# Patient Record
Sex: Female | Born: 1950 | Race: Black or African American | Hispanic: No | Marital: Single | Smoking: Never smoker
Health system: Southern US, Community
[De-identification: ages and names within clinical notes are randomized; demographics above are authoritative.]

## PROBLEM LIST (undated history)

## (undated) DIAGNOSIS — M1990 Unspecified osteoarthritis, unspecified site: Secondary | ICD-10-CM

## (undated) DIAGNOSIS — E785 Hyperlipidemia, unspecified: Secondary | ICD-10-CM

## (undated) DIAGNOSIS — F419 Anxiety disorder, unspecified: Secondary | ICD-10-CM

## (undated) DIAGNOSIS — T7840XA Allergy, unspecified, initial encounter: Secondary | ICD-10-CM

## (undated) DIAGNOSIS — R112 Nausea with vomiting, unspecified: Secondary | ICD-10-CM

## (undated) DIAGNOSIS — H409 Unspecified glaucoma: Secondary | ICD-10-CM

## (undated) DIAGNOSIS — Z9889 Other specified postprocedural states: Secondary | ICD-10-CM

## (undated) DIAGNOSIS — I10 Essential (primary) hypertension: Secondary | ICD-10-CM

## (undated) DIAGNOSIS — K219 Gastro-esophageal reflux disease without esophagitis: Secondary | ICD-10-CM

## (undated) HISTORY — DX: Unspecified osteoarthritis, unspecified site: M19.90

## (undated) HISTORY — DX: Allergy, unspecified, initial encounter: T78.40XA

## (undated) HISTORY — PX: OTHER SURGICAL HISTORY: SHX169

## (undated) HISTORY — DX: Anxiety disorder, unspecified: F41.9

## (undated) HISTORY — DX: Gastro-esophageal reflux disease without esophagitis: K21.9

## (undated) HISTORY — DX: Hyperlipidemia, unspecified: E78.5

## (undated) HISTORY — PX: BREAST CYST EXCISION: SHX579

## (undated) HISTORY — PX: TUBAL LIGATION: SHX77

## (undated) HISTORY — PX: PARTIAL HYSTERECTOMY: SHX80

---

## 2002-02-03 ENCOUNTER — Encounter (HOSPITAL_COMMUNITY): Admission: RE | Admit: 2002-02-03 | Discharge: 2002-03-05 | Payer: Self-pay | Admitting: Preventative Medicine

## 2002-02-15 ENCOUNTER — Encounter: Payer: Self-pay | Admitting: Preventative Medicine

## 2002-02-15 ENCOUNTER — Ambulatory Visit (HOSPITAL_COMMUNITY): Admission: RE | Admit: 2002-02-15 | Discharge: 2002-02-15 | Payer: Self-pay | Admitting: Preventative Medicine

## 2005-02-21 ENCOUNTER — Ambulatory Visit: Payer: Self-pay | Admitting: Family Medicine

## 2005-02-28 ENCOUNTER — Ambulatory Visit (HOSPITAL_COMMUNITY): Admission: RE | Admit: 2005-02-28 | Discharge: 2005-02-28 | Payer: Self-pay | Admitting: Family Medicine

## 2006-08-15 ENCOUNTER — Ambulatory Visit: Payer: Self-pay | Admitting: Internal Medicine

## 2006-08-22 DIAGNOSIS — F4323 Adjustment disorder with mixed anxiety and depressed mood: Secondary | ICD-10-CM

## 2006-08-22 DIAGNOSIS — F411 Generalized anxiety disorder: Secondary | ICD-10-CM

## 2006-08-22 DIAGNOSIS — G5600 Carpal tunnel syndrome, unspecified upper limb: Secondary | ICD-10-CM

## 2006-08-22 DIAGNOSIS — M1990 Unspecified osteoarthritis, unspecified site: Secondary | ICD-10-CM | POA: Insufficient documentation

## 2006-08-28 ENCOUNTER — Emergency Department (HOSPITAL_COMMUNITY): Admission: EM | Admit: 2006-08-28 | Discharge: 2006-08-28 | Payer: Self-pay | Admitting: Emergency Medicine

## 2006-09-05 ENCOUNTER — Ambulatory Visit: Payer: Self-pay | Admitting: Internal Medicine

## 2006-11-21 ENCOUNTER — Ambulatory Visit: Payer: Self-pay | Admitting: Internal Medicine

## 2006-11-27 ENCOUNTER — Ambulatory Visit: Payer: Self-pay | Admitting: Orthopedic Surgery

## 2007-01-09 ENCOUNTER — Ambulatory Visit: Payer: Self-pay | Admitting: Internal Medicine

## 2007-01-09 DIAGNOSIS — I951 Orthostatic hypotension: Secondary | ICD-10-CM | POA: Insufficient documentation

## 2007-02-11 ENCOUNTER — Telehealth (INDEPENDENT_AMBULATORY_CARE_PROVIDER_SITE_OTHER): Payer: Self-pay | Admitting: Internal Medicine

## 2007-04-10 ENCOUNTER — Encounter: Payer: Self-pay | Admitting: Internal Medicine

## 2007-04-10 DIAGNOSIS — N318 Other neuromuscular dysfunction of bladder: Secondary | ICD-10-CM

## 2007-10-29 ENCOUNTER — Encounter: Payer: Self-pay | Admitting: Family Medicine

## 2008-01-08 ENCOUNTER — Ambulatory Visit: Payer: Self-pay | Admitting: Internal Medicine

## 2008-01-08 DIAGNOSIS — J309 Allergic rhinitis, unspecified: Secondary | ICD-10-CM | POA: Insufficient documentation

## 2008-02-15 ENCOUNTER — Encounter: Payer: Self-pay | Admitting: Orthopedic Surgery

## 2008-03-04 ENCOUNTER — Encounter (INDEPENDENT_AMBULATORY_CARE_PROVIDER_SITE_OTHER): Payer: Self-pay | Admitting: Internal Medicine

## 2008-03-11 ENCOUNTER — Ambulatory Visit: Payer: Self-pay | Admitting: Internal Medicine

## 2008-03-25 ENCOUNTER — Encounter (INDEPENDENT_AMBULATORY_CARE_PROVIDER_SITE_OTHER): Payer: Self-pay | Admitting: Internal Medicine

## 2008-03-28 ENCOUNTER — Encounter (INDEPENDENT_AMBULATORY_CARE_PROVIDER_SITE_OTHER): Payer: Self-pay | Admitting: Internal Medicine

## 2008-04-01 ENCOUNTER — Telehealth (INDEPENDENT_AMBULATORY_CARE_PROVIDER_SITE_OTHER): Payer: Self-pay | Admitting: *Deleted

## 2008-05-17 ENCOUNTER — Ambulatory Visit: Payer: Self-pay | Admitting: Family Medicine

## 2008-05-21 ENCOUNTER — Encounter: Payer: Self-pay | Admitting: Family Medicine

## 2008-05-21 LAB — CONVERTED CEMR LAB
BUN: 10 mg/dL (ref 6–23)
Basophils Absolute: 0 10*3/uL (ref 0.0–0.1)
Basophils Relative: 0 % (ref 0–1)
Chloride: 105 meq/L (ref 96–112)
Eosinophils Absolute: 0.4 10*3/uL (ref 0.0–0.7)
Eosinophils Relative: 6 % — ABNORMAL HIGH (ref 0–5)
HDL: 62 mg/dL (ref >39)
Helicobacter Pylori Antibody-IgG: >8 — ABNORMAL HIGH
LDL Cholesterol: 185 mg/dL — ABNORMAL HIGH (ref 0–99)
MCV: 91 fL (ref 78.0–100.0)
Monocytes Absolute: 0.6 10*3/uL (ref 0.1–1.0)
Monocytes Relative: 8 % (ref 3–12)
Neutrophils Relative %: 44 % (ref 43–77)
Potassium: 4.1 meq/L (ref 3.5–5.3)
Sodium: 138 meq/L (ref 135–145)
TSH: 1.693 microintl units/mL (ref 0.350–4.50)
Total CHOL/HDL Ratio: 4.3
Triglycerides: 112 mg/dL (ref <150)
VLDL: 22 mg/dL (ref 0–40)
WBC: 7.6 10*3/uL (ref 4.0–10.5)

## 2008-05-24 ENCOUNTER — Encounter: Payer: Self-pay | Admitting: Family Medicine

## 2008-05-24 DIAGNOSIS — K219 Gastro-esophageal reflux disease without esophagitis: Secondary | ICD-10-CM

## 2008-05-24 LAB — CONVERTED CEMR LAB
Hep A IgM: NEGATIVE
Hepatitis B Surface Ag: NEGATIVE

## 2008-05-25 ENCOUNTER — Ambulatory Visit (HOSPITAL_COMMUNITY): Admission: RE | Admit: 2008-05-25 | Discharge: 2008-05-25 | Payer: Self-pay | Admitting: Family Medicine

## 2008-05-27 ENCOUNTER — Encounter: Payer: Self-pay | Admitting: Family Medicine

## 2008-05-27 LAB — CONVERTED CEMR LAB: ALT: 19 units/L (ref 0–35)

## 2008-06-06 ENCOUNTER — Telehealth: Payer: Self-pay | Admitting: Family Medicine

## 2008-06-07 ENCOUNTER — Telehealth: Payer: Self-pay | Admitting: Family Medicine

## 2008-06-22 ENCOUNTER — Other Ambulatory Visit: Admission: RE | Admit: 2008-06-22 | Discharge: 2008-06-22 | Payer: Self-pay | Admitting: Family Medicine

## 2008-06-22 ENCOUNTER — Ambulatory Visit: Payer: Self-pay | Admitting: Family Medicine

## 2008-06-22 ENCOUNTER — Encounter: Payer: Self-pay | Admitting: Family Medicine

## 2008-06-22 DIAGNOSIS — E785 Hyperlipidemia, unspecified: Secondary | ICD-10-CM

## 2008-08-02 ENCOUNTER — Ambulatory Visit (HOSPITAL_COMMUNITY): Admission: RE | Admit: 2008-08-02 | Discharge: 2008-08-02 | Payer: Self-pay | Admitting: General Surgery

## 2008-08-02 ENCOUNTER — Encounter: Payer: Self-pay | Admitting: Family Medicine

## 2008-08-09 ENCOUNTER — Encounter: Payer: Self-pay | Admitting: Family Medicine

## 2008-09-27 ENCOUNTER — Telehealth: Payer: Self-pay | Admitting: Family Medicine

## 2009-03-13 ENCOUNTER — Ambulatory Visit: Payer: Self-pay | Admitting: Family Medicine

## 2009-03-13 DIAGNOSIS — J069 Acute upper respiratory infection, unspecified: Secondary | ICD-10-CM | POA: Insufficient documentation

## 2009-03-13 DIAGNOSIS — J019 Acute sinusitis, unspecified: Secondary | ICD-10-CM | POA: Insufficient documentation

## 2009-03-18 ENCOUNTER — Encounter: Payer: Self-pay | Admitting: Family Medicine

## 2009-03-20 ENCOUNTER — Telehealth: Payer: Self-pay | Admitting: Family Medicine

## 2009-03-22 LAB — CONVERTED CEMR LAB
AST: 32 units/L (ref 0–37)
Albumin: 4.2 g/dL (ref 3.5–5.2)
Bilirubin, Direct: 0.1 mg/dL (ref 0.0–0.3)
Cholesterol: 177 mg/dL (ref 0–200)
Indirect Bilirubin: 0.3 mg/dL (ref 0.0–0.9)
LDL Cholesterol: 89 mg/dL (ref 0–99)
Total Bilirubin: 0.4 mg/dL (ref 0.3–1.2)
Triglycerides: 126 mg/dL (ref <150)
VLDL: 25 mg/dL (ref 0–40)

## 2009-04-03 ENCOUNTER — Telehealth: Payer: Self-pay | Admitting: Family Medicine

## 2009-05-22 ENCOUNTER — Telehealth: Payer: Self-pay | Admitting: Family Medicine

## 2009-08-01 ENCOUNTER — Telehealth: Payer: Self-pay | Admitting: Family Medicine

## 2009-10-30 ENCOUNTER — Ambulatory Visit: Payer: Self-pay | Admitting: Family Medicine

## 2009-10-30 DIAGNOSIS — R5381 Other malaise: Secondary | ICD-10-CM | POA: Insufficient documentation

## 2009-10-30 DIAGNOSIS — R5383 Other fatigue: Secondary | ICD-10-CM

## 2009-11-02 ENCOUNTER — Ambulatory Visit (HOSPITAL_COMMUNITY): Admission: RE | Admit: 2009-11-02 | Discharge: 2009-11-02 | Payer: Self-pay | Admitting: Family Medicine

## 2009-11-06 LAB — CONVERTED CEMR LAB
AST: 26 units/L (ref 0–37)
Alkaline Phosphatase: 42 units/L (ref 39–117)
BUN: 9 mg/dL (ref 6–23)
Basophils Relative: 1 % (ref 0–1)
CO2: 27 meq/L (ref 19–32)
Calcium: 9.6 mg/dL (ref 8.4–10.5)
Chloride: 103 meq/L (ref 96–112)
HDL: 59 mg/dL (ref >39)
MCHC: 33 g/dL (ref 30.0–36.0)
MCV: 91.4 fL (ref 78.0–100.0)
Monocytes Absolute: 0.4 10*3/uL (ref 0.1–1.0)
Platelets: 311 10*3/uL (ref 150–400)
Potassium: 4.3 meq/L (ref 3.5–5.3)
RBC: 3.84 M/uL — ABNORMAL LOW (ref 3.87–5.11)
Sodium: 142 meq/L (ref 135–145)
TSH: 0.883 microintl units/mL (ref 0.350–4.500)
Total CHOL/HDL Ratio: 3.4
Triglycerides: 122 mg/dL (ref <150)

## 2010-01-01 ENCOUNTER — Encounter: Payer: Self-pay | Admitting: Family Medicine

## 2010-04-13 ENCOUNTER — Telehealth (INDEPENDENT_AMBULATORY_CARE_PROVIDER_SITE_OTHER): Payer: Self-pay | Admitting: *Deleted

## 2010-07-09 ENCOUNTER — Telehealth: Payer: Self-pay | Admitting: Family Medicine

## 2010-09-26 ENCOUNTER — Ambulatory Visit: Payer: Self-pay | Admitting: Family Medicine

## 2010-09-26 DIAGNOSIS — J209 Acute bronchitis, unspecified: Secondary | ICD-10-CM | POA: Insufficient documentation

## 2010-11-17 ENCOUNTER — Encounter: Payer: Self-pay | Admitting: Internal Medicine

## 2010-11-27 NOTE — Assessment & Plan Note (Signed)
Summary: office visit   Vital Signs:  Patient profile:   60 year old female Menstrual status:  hysterectomy Height:      65.5 inches Weight:      152.75 pounds BMI:     25.12 O2 Sat:      99 % on Room air Pulse rate:   80 / minute Pulse rhythm:   regular Resp:     16 per minute BP sitting:   140 / 90  (left arm)  Vitals Entered By: Adella Hare LPN (September 26, 2010 11:38 AM)  Nutrition Counseling: Patient's BMI is greater than 25 and therefore counseled on weight management options.  O2 Flow:  Room air CC: follow-up visit Is Patient Diabetic? No Pain Assessment Patient in pain? no        Primary Care Zarianna Dicarlo:  Syliva Overman MD  CC:  follow-up visit.  History of Present Illness: 5 day h/o cough productive of green sputum occasional choills. Left TM j pain x 2 weeks increased post nsal drainage, her symptoms persist.  Reports  that they are doing well.  Denies chest pain, palpitations, PND, orthopnea or leg swelling. Denies abdominal pain, nausea, vomitting, diarrhea or constipation. Denies change in bowel movements or bloody stool. Denies dysuria , frequency, incontinence or hesitancy. Denies  joint pain, swelling, or reduced mobility. Denies headaches, vertigo, seizures. Denies uncontrolled  depression, anxiety or insomnia.currently on meds. Denies  rash, lesions, or itch.     Current Medications (verified): 1)  Daily Multiple Vitamins  Tabs (Multiple Vitamin) 2)  Calcium 600 Mg Tabs (Calcium) 3)  Citalopram Hydrobromide 20 Mg  Tabs (Citalopram Hydrobromide) .Marland Kitchen.. 1 By Mouth Once Daily 4)  Loratadine 10 Mg  Tabs (Loratadine) .Marland Kitchen.. 1 By Mouth Once Daily 5)  Crestor 20 Mg Tabs (Rosuvastatin Calcium) .... One Tab By Mouth Qhs 6)  Flonase 50 Mcg/act Susp (Fluticasone Propionate) .Marland Kitchen.. 1 Puff Per Nostril Daily 7)  Omeprazole 20 Mg Cpdr (Omeprazole) .... Two Cap By Mouth Once Daily  Allergies (verified): 1)  ! Motrin 2)  ! * Strong Pain Meds  Review of  Systems      See HPI General:  Complains of chills and fatigue. Eyes:  Denies discharge, eye pain, and red eye. ENT:  Complains of nasal congestion, postnasal drainage, and sinus pressure. CV:  Denies chest pain or discomfort, palpitations, and swelling of feet. Resp:  Complains of cough and sputum productive. GI:  Denies abdominal pain, constipation, diarrhea, nausea, and vomiting. GU:  Denies dysuria and urinary frequency. Endo:  Denies cold intolerance, excessive hunger, excessive thirst, and polyuria. Heme:  Denies abnormal bruising and bleeding. Allergy:  Denies hives or rash and itching eyes.  Physical Exam  General:  Well-developed,well-nourished,in no acute distress; alert,appropriate and cooperative throughout examinationPt is hoarse HEENT: No facial asymmetry,  EOMI, maxillary inus tenderness, TM's Clear, left TMJ tender, oropharynx  pink and moist.   Chest: decreased airentry, scattered crackles, no wheezes CVS: S1, S2, No murmurs, No S3.   Abd: Soft, Nontender.  MS: Adequate ROM spine, hips, shoulders and knees.  Ext: No edema.   CNS: CN 2-12 intact, power tone and sensation normal throughout.   Skin: Intact, no visible lesions or rashes.  Psych: Good eye contact, normal affect.  Memory intact, not anxious or depressed appearing.    Impression & Recommendations:  Problem # 1:  ACUTE BRONCHITIS (ICD-466.0) Assessment Comment Only  The following medications were removed from the medication list:    Penicillin V Potassium 500 Mg  Tabs (Penicillin v potassium) .Marland Kitchen... Take 1 tablet by mouth three times a day Her updated medication list for this problem includes:    Penicillin V Potassium 500 Mg Tabs (Penicillin v potassium) .Marland Kitchen... Take 1 tablet by mouth three times a day    Tessalon Perles 100 Mg Caps (Benzonatate) .Marland Kitchen... Take 1 capsule by mouth three times a day  Orders: Rocephin  250mg  (Z6109) Depo- Medrol 80mg  (J1040) Admin of Therapeutic Inj  intramuscular or  subcutaneous (60454)  Problem # 2:  ACUTE SINUSITIS, UNSPECIFIED (ICD-461.9) Assessment: Comment Only  The following medications were removed from the medication list:    Penicillin V Potassium 500 Mg Tabs (Penicillin v potassium) .Marland Kitchen... Take 1 tablet by mouth three times a day Her updated medication list for this problem includes:    Flonase 50 Mcg/act Susp (Fluticasone propionate) .Marland Kitchen... 1 puff per nostril daily    Penicillin V Potassium 500 Mg Tabs (Penicillin v potassium) .Marland Kitchen... Take 1 tablet by mouth three times a day    Tessalon Perles 100 Mg Caps (Benzonatate) .Marland Kitchen... Take 1 capsule by mouth three times a day  Problem # 3:  DEPRESSION (ICD-311) Assessment: Improved  Her updated medication list for this problem includes:    Citalopram Hydrobromide 20 Mg Tabs (Citalopram hydrobromide) .Marland Kitchen... 1 by mouth once daily  Problem # 4:  HYPERLIPIDEMIA (ICD-272.4) Assessment: Comment Only  Her updated medication list for this problem includes:    Crestor 20 Mg Tabs (Rosuvastatin calcium) ..... One tab by mouth qhs  Labs Reviewed: SGOT: 26 (10/31/2009)   SGPT: 22 (10/31/2009)   HDL:59 (10/31/2009), 63 (03/18/2009)  LDL:115 (10/31/2009), 89 (09/81/1914)  Chol:198 (10/31/2009), 177 (03/18/2009)  Trig:122 (10/31/2009), 126 (03/18/2009)  Complete Medication List: 1)  Daily Multiple Vitamins Tabs (Multiple vitamin) 2)  Calcium 600 Mg Tabs (Calcium) 3)  Citalopram Hydrobromide 20 Mg Tabs (Citalopram hydrobromide) .Marland Kitchen.. 1 by mouth once daily 4)  Loratadine 10 Mg Tabs (Loratadine) .Marland Kitchen.. 1 by mouth once daily 5)  Crestor 20 Mg Tabs (Rosuvastatin calcium) .... One tab by mouth qhs 6)  Flonase 50 Mcg/act Susp (Fluticasone propionate) .Marland Kitchen.. 1 puff per nostril daily 7)  Omeprazole 20 Mg Cpdr (Omeprazole) .... Two cap by mouth once daily 8)  Penicillin V Potassium 500 Mg Tabs (Penicillin v potassium) .... Take 1 tablet by mouth three times a day 9)  Tessalon Perles 100 Mg Caps (Benzonatate) .... Take 1  capsule by mouth three times a day  Patient Instructions: 1)  you are being treated for sinusits , laryngitis and  bronchitis,. 2)  Meds will be sent to your pharmacy, also  you will get injections in the office. Prescriptions: TESSALON PERLES 100 MG CAPS (BENZONATATE) Take 1 capsule by mouth three times a day  #21 x 0   Entered and Authorized by:   Syliva Overman MD   Signed by:   Syliva Overman MD on 09/26/2010   Method used:   Electronically to        [facility] [phone]* (retail)       [address]       [place], [place]  [address]       Ph: [phone] or [phone]       Fax: [phone]   RxID:   [id number] PENICILLIN V POTASSIUM 500 MG TABS (PENICILLIN V POTASSIUM) Take 1 tablet by mouth three times a day  #21 x 0   Entered and Authorized by:   Syliva Overman MD  Signed by:   Syliva Overman MD on 09/26/2010   Method used:   Electronically to        [facility] [phone]* (retail)       [address]       [place], [place]  [address]       Ph: [phone] or [phone]       Fax: [phone]   RxID:   [id number]    Medication Administration  Injection # 1:    Medication: Rocephin  250mg     Diagnosis: ACUTE BRONCHITIS (ICD-466.0)    Route: IM    Site: LUOQ gluteus    Exp Date: 01/14    Lot #: [id number]    Mfr: [email or web address]    Comments: rocephon 500mg  given    Patient tolerated injection without complications    Given by: Adella Hare LPN (September 26, 2010 2:18 PM)  Injection # 2:    Medication: Depo- Medrol 80mg     Diagnosis: ACUTE BRONCHITIS (ICD-466.0)    Route: IM    Site: RUOQ gluteus    Exp Date: 06/12    Lot #: [id number]    Mfr: Pharmacia    Patient tolerated injection without complications    Given by: Adella Hare LPN (September 26, 2010 2:18 PM)  Orders Added: 1)  Est. Patient Level IV [34742] 2)  Rocephin  250mg  [J0696] 3)  Depo- Medrol 80mg  [J1040] 4)  Admin of Therapeutic Inj  intramuscular or  subcutaneous [96372]     Crestor samples given [id number] 02/14  Medication Administration  Injection # 1:    Medication: Rocephin  250mg     Diagnosis: ACUTE BRONCHITIS (ICD-466.0)    Route: IM    Site: LUOQ gluteus    Exp Date: 01/14    Lot #: [id number]    Mfr: [email or web address]    Comments: rocephon 500mg  given    Patient tolerated injection without complications    Given by: Adella Hare LPN (September 26, 2010 2:18 PM)  Injection # 2:    Medication: Depo- Medrol 80mg     Diagnosis: ACUTE BRONCHITIS (ICD-466.0)    Route: IM    Site: RUOQ gluteus    Exp Date: 06/12    Lot #: [id number]    Mfr: Pharmacia    Patient tolerated injection without complications    Given by: Adella Hare LPN (September 26, 2010 2:18 PM)  Orders Added: 1)  Est. Patient Level IV [29518] 2)  Rocephin  250mg  [J0696] 3)  Depo- Medrol 80mg  [J1040] 4)  Admin of Therapeutic Inj  intramuscular or subcutaneous [84166]

## 2010-11-27 NOTE — Letter (Signed)
Summary: rpc chart  rpc chart   Imported By: Curtis Sites 08/09/2010 15:20:36  _____________________________________________________________________  External Attachment:    Type:   Image     Comment:   External Document

## 2010-11-27 NOTE — Progress Notes (Signed)
Summary: CRESTOR SAMPLES  Phone Note Call from Patient   Summary of Call: PATIENT WOULD LIKE CRESTOR SAMPLES PLEASE ADVISE.   Initial call taken by: Rudene Anda,  July 09, 2010 2:56 PM  Follow-up for Phone Call        patient aware Follow-up by: Adella Hare LPN,  July 09, 2010 4:24 PM

## 2010-11-27 NOTE — Progress Notes (Signed)
Summary: SAMPLES  Phone Note Call from Patient   Summary of Call: NEEDS SAMPLES OF CRESTOR Initial call taken by: Lind Guest,  April 13, 2010 10:44 AM  Follow-up for Phone Call        there a re 5 weeks  of crestor in the cupboard pls give them to her ,  Follow-up by: Syliva Overman MD,  April 13, 2010 12:35 PM  Additional Follow-up for Phone Call Additional follow up Details #1::        Patient aware there are some here for her Additional Follow-up by: Everitt Amber LPN,  April 16, 2010 10:06 AM

## 2010-11-27 NOTE — Assessment & Plan Note (Signed)
Summary: office visit   Vital Signs:  Patient profile:   60 year old female Menstrual status:  hysterectomy Height:      65.5 inches Weight:      155.50 pounds BMI:     25.58 O2 Sat:      95 % Pulse rate:   74 / minute Pulse rhythm:   regular Resp:     16 per minute BP sitting:   138 / 82  (left arm)  Vitals Entered By: Everitt Amber (October 30, 2009 11:23 AM)  Nutrition Counseling: Patient's BMI is greater than 25 and therefore counseled on weight management options. CC: Follow up chronic problems Is Patient Diabetic? No   Primary Care Provider:  Syliva Overman MD  CC:  Follow up chronic problems.  History of Present Illness: Reports  that she has been  doing well. Denies recent fever or chills. Denies sinus pressure, nasal congestion , ear pain or sore throat. Denies chest congestion, or cough productive of sputum. Denies chest pain, palpitations, PND, orthopnea or leg swelling. Denies abdominal pain, nausea, vomitting, diarrhea or constipation. Denies change in bowel movements or bloody stool. Denies dysuria , frequency, incontinence or hesitancy. Denies  joint pain, swelling, or reduced mobility. Denies headaches, vertigo, seizures.  Denies  rash, lesions, or itch. She reports continued depressioin,discusses the fact that her only daughter is gay, she has problems with this espescially since people continually put it in her face. SDhe has used alcohol in the past to self medicate, and continues to struggle with this. She does not exercise regfularly, although she manages to maintain a health weight,.     Preventive Screening-Counseling & Management  Alcohol-Tobacco     Smoking Cessation Counseling: yes  Allergies: 1)  ! Motrin 2)  ! * Strong Pain Meds  Review of Systems      See HPI Eyes:  Denies blurring, discharge, and red eye. Neuro:  Denies headaches, seizures, and sensation of room spinning. Psych:  Complains of depression and easily tearful; denies  sense of great danger, suicidal thoughts/plans, thoughts of violence, and unusual visions or sounds. Endo:  Denies cold intolerance, excessive hunger, excessive thirst, excessive urination, heat intolerance, polyuria, and weight change. Heme:  Denies abnormal bruising and bleeding. Allergy:  Complains of seasonal allergies and sneezing; denies hives or rash.  Physical Exam  General:  Well-developed,well-nourished,in no acute distress; alert,appropriate and cooperative throughout examination HEENT: No facial asymmetry,  EOMI, No sinus tenderness, TM's Clear, oropharynx  pink and moist.   Chest: Clear to auscultation bilaterally.  CVS: S1, S2, No murmurs, No S3.   Abd: Soft, Nontender.  MS: Adequate ROM spine, hips, shoulders and knees.  Ext: No edema.   CNS: CN 2-12 intact, power tone and sensation normal throughout.   Skin: Intact, no visible lesions or rashes.  Psych: Good eye contact, normal affect.  Memory intact, not anxious or depressed appearing.    Impression & Recommendations:  Problem # 1:  HYPERLIPIDEMIA (ICD-272.4) Assessment Comment Only  Her updated medication list for this problem includes:    Crestor 20 Mg Tabs (Rosuvastatin calcium) ..... 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 1 by mouth once daily    Flonase 50 Mcg/act Susp (Fluticasone propionate) .Marland Kitchen... 1 puff per nostril daily  Problem # 5:  DEPRESSION (ICD-311) Assessment: Improved  Her updated medication list for this problem includes:    Citalopram Hydrobromide 20 Mg Tabs (Citalopram hydrobromide) .Marland Kitchen... 1 by mouth once daily pt ventilated for approx 15 mins during the ov, she does not feel the need to seek pout regular therapy  Complete Medication List: 1)  Daily Multiple Vitamins Tabs (Multiple vitamin) 2)  Calcium 600 Mg Tabs (Calcium) 3)  Citalopram Hydrobromide 20 Mg Tabs (Citalopram hydrobromide) .Marland Kitchen.. 1 by mouth once daily 4)  Loratadine 10 Mg Tabs (Loratadine) .Marland Kitchen.. 1 by mouth once daily 5)  Crestor 20 Mg Tabs (Rosuvastatin calcium) .... One tab by mouth qhs 6)  Penicillin V Potassium 500 Mg Tabs (Penicillin v potassium) .... Take 1 tablet by mouth three times a day 7)  Flonase 50 Mcg/act Susp (Fluticasone propionate) .Marland Kitchen.. 1 puff per nostril daily 8)  Omeprazole 20 Mg Cpdr (Omeprazole) .... Two cap by mouth once daily  Other Orders: T-Basic Metabolic Panel [id number]) T-CBC w/Diff [id number]) T-TSH [id number]) Radiology Referral (Radiology)  Patient Instructions: 1)  cPE in 5.26months. 2)  Tobacco is very bad for your health and your loved ones! You Should stop smoking!. 3)  Stop Smoking Tips: Choose a Quit date. Cut down before the Quit date. decide what you will do as a substitute when you feel the urge to smoke(gum,toothpick,exercise). 4)  It is not healthy  for men to drink more than 2-3 drinks per day or for women to drink more than 1-2 drinks per day. 5)  BMP prior to visit, ICD-9: 6)  Hepatic Panel prior to visit, ICD-9: 7)  Lipid Panel prior to visit, ICD-9: fasting tomorrow 8)  TSH prior to visit, ICD-9: 9)  CBC w/ Diff prior to visit, ICD-9:

## 2010-11-27 NOTE — Miscellaneous (Signed)
Summary: samples  Clinical Lists Changes  Medications: Changed medication from CRESTOR 20 MG TABS (ROSUVASTATIN CALCIUM) ONE TAB by mouth QHS to CRESTOR 20 MG TABS (ROSUVASTATIN CALCIUM) ONE TAB by mouth QHS - Signed Rx of CRESTOR 20 MG TABS (ROSUVASTATIN CALCIUM) ONE TAB by mouth QHS;  #42 x 0;  Signed;  Entered by: Everitt Amber LPN;  Authorized by: Syliva Overman MD;  Method used: Samples Given    Prescriptions: CRESTOR 20 MG TABS (ROSUVASTATIN CALCIUM) ONE TAB by mouth QHS  #42 x 0   Entered by:   Everitt Amber LPN   Authorized by:   Syliva Overman MD   Signed by:   Everitt Amber LPN on 60/45/4098   Method used:   Samples Given   RxID:   [id number]

## 2011-02-04 ENCOUNTER — Telehealth: Payer: Self-pay | Admitting: Family Medicine

## 2011-02-04 NOTE — Telephone Encounter (Signed)
Once urine submitted for c/s only, cipro 500mg  one twice daily for 7 days is to be prescribed

## 2011-02-05 ENCOUNTER — Telehealth: Payer: Self-pay | Admitting: Family Medicine

## 2011-02-05 NOTE — Telephone Encounter (Signed)
Patient to come submit urine

## 2011-02-06 ENCOUNTER — Ambulatory Visit (INDEPENDENT_AMBULATORY_CARE_PROVIDER_SITE_OTHER): Payer: Self-pay | Admitting: Family Medicine

## 2011-02-06 DIAGNOSIS — N3000 Acute cystitis without hematuria: Secondary | ICD-10-CM

## 2011-02-06 DIAGNOSIS — N3090 Cystitis, unspecified without hematuria: Secondary | ICD-10-CM

## 2011-02-06 LAB — POCT URINALYSIS DIPSTICK
Bilirubin, UA: NEGATIVE
Glucose, UA: NEGATIVE
Ketones, UA: NEGATIVE
Nitrite, UA: NEGATIVE
Urobilinogen, UA: 0.2
pH, UA: 7

## 2011-02-06 MED ORDER — CIPROFLOXACIN HCL 500 MG PO TABS: 500.0000 mg | ORAL_TABLET | Freq: Two times a day (BID) | ORAL | Status: AC

## 2011-02-06 NOTE — Progress Notes (Signed)
Urine positive for blood and leukocytes. Sending for culture.

## 2011-02-06 NOTE — Progress Notes (Signed)
pls call pt she needs a cbc and diff , offer rocephin 500mg  Im x 1 since she has been symptomatic for a while, and also  Let her know to fill the script for ciprofloxacin today since she appears to have a uti

## 2011-02-06 NOTE — Progress Notes (Signed)
  Subjective:    Patient ID: [name], female    DOB: [birth year], 60 y.o.   MRN: [id number]  HPI    Review of Systems     Objective:   Physical Exam        Assessment & Plan:

## 2011-02-06 NOTE — Progress Notes (Signed)
  Subjective:    Patient ID: [name], female    DOB: May 25, 1951, 60 y.o.   MRN: [id number]  HPI    Review of Systems     Objective:   Physical Exam        Assessment & Plan:  Pt needs to fill script for ciprofloxacin today, she is to be informed

## 2011-02-11 NOTE — Progress Notes (Signed)
Patient does not want injection or labs. Wants to see if the meds work first

## 2011-02-24 ENCOUNTER — Observation Stay (HOSPITAL_COMMUNITY)
Admission: EM | Admit: 2011-02-24 | Discharge: 2011-02-26 | Disposition: A | Discharge status: Home / Self Care | Payer: Self-pay | Source: Ambulatory Visit | Attending: Internal Medicine | Admitting: Internal Medicine

## 2011-02-24 ENCOUNTER — Emergency Department (HOSPITAL_COMMUNITY): Payer: Self-pay

## 2011-02-24 DIAGNOSIS — IMO0001 Reserved for inherently not codable concepts without codable children: Secondary | ICD-10-CM | POA: Insufficient documentation

## 2011-02-24 DIAGNOSIS — F411 Generalized anxiety disorder: Secondary | ICD-10-CM | POA: Insufficient documentation

## 2011-02-24 DIAGNOSIS — R269 Unspecified abnormalities of gait and mobility: Secondary | ICD-10-CM | POA: Insufficient documentation

## 2011-02-24 DIAGNOSIS — R55 Syncope and collapse: Secondary | ICD-10-CM | POA: Insufficient documentation

## 2011-02-24 DIAGNOSIS — T466X5A Adverse effect of antihyperlipidemic and antiarteriosclerotic drugs, initial encounter: Secondary | ICD-10-CM | POA: Insufficient documentation

## 2011-02-24 DIAGNOSIS — R209 Unspecified disturbances of skin sensation: Secondary | ICD-10-CM | POA: Insufficient documentation

## 2011-02-24 DIAGNOSIS — E785 Hyperlipidemia, unspecified: Secondary | ICD-10-CM | POA: Insufficient documentation

## 2011-02-24 DIAGNOSIS — G459 Transient cerebral ischemic attack, unspecified: Principal | ICD-10-CM | POA: Insufficient documentation

## 2011-02-24 LAB — BASIC METABOLIC PANEL
CO2: 28 mEq/L (ref 19–32)
Calcium: 9.5 mg/dL (ref 8.4–10.5)
Creatinine, Ser: 0.87 mg/dL (ref 0.4–1.2)
GFR calc non Af Amer: >60 mL/min (ref >60)
Glucose, Bld: 92 mg/dL (ref 70–99)
Sodium: 140 mEq/L (ref 135–145)

## 2011-02-24 LAB — DIFFERENTIAL
Lymphocytes Relative: 37 % (ref 12–46)
Lymphs Abs: 1.9 10*3/uL (ref 0.7–4.0)
Neutro Abs: 2.4 10*3/uL (ref 1.7–7.7)

## 2011-02-24 LAB — POCT CARDIAC MARKERS
CKMB, poc: 1.6 ng/mL (ref 1.0–8.0)
Troponin i, poc: <0.05 ng/mL (ref 0.00–0.09)

## 2011-02-24 LAB — CBC
HCT: 34.1 % — ABNORMAL LOW (ref 36.0–46.0)
Hemoglobin: 11.3 g/dL — ABNORMAL LOW (ref 12.0–15.0)
MCH: 30.2 pg (ref 26.0–34.0)
MCHC: 33.1 g/dL (ref 30.0–36.0)
MCV: 91.2 fL (ref 78.0–100.0)

## 2011-02-24 LAB — URINALYSIS, ROUTINE W REFLEX MICROSCOPIC
Glucose, UA: NEGATIVE mg/dL
Specific Gravity, Urine: <1.005 — ABNORMAL LOW (ref 1.005–1.030)
Urobilinogen, UA: 0.2 mg/dL (ref 0.0–1.0)
pH: 7 (ref 5.0–8.0)

## 2011-02-24 LAB — CARDIAC PANEL(CRET KIN+CKTOT+MB+TROPI)
CK, MB: 2.6 ng/mL (ref 0.3–4.0)
Total CK: 191 U/L — ABNORMAL HIGH (ref 7–177)

## 2011-02-24 NOTE — H&P (Signed)
NAMELINETH, THIELKE                   ACCOUNT NO.:  [id number]  MEDICAL RECORD NO.:  [id number]           PATIENT TYPE:  E  LOCATION:  [facility]                          FACILITY:  [facility]  PHYSICIAN:  Talmage Nap, MD  DATE OF BIRTH:  [birth year]  DATE OF ADMISSION:  02/24/2011 DATE OF DISCHARGE:  LH                             HISTORY & PHYSICAL   PRIMARY CARE PHYSICIAN:  Milus Mallick. Lodema Hong, MD, Family Practice.  History obtainable from the patient, the patient's granddaughter and a family member.  CHIEF COMPLAINT:  Numbness on the right side of the face and unsteady gait which occurred at about 10 o'clock a.m at church while the patient was singing.  The patient is a 60 year old very pleasant African American female with history of anxiety disorder and dyslipidemia, presenting to the hospital with sudden onset of numbness on the right side of the face and unsteady gait and this was said to have occurred while the patient was singing in church.  The patient says she had been in stable health until today at about 10 o'clock a.m while she was in the church.  She observed that she was numb on the right side of the face, especially around the mouth.  This was said to be associated with dizziness and feeling of trying to pass out.  She was also said to have had "unsteady gait."No slurred speech. She denied any history of chest pain.  She claims she had subjective feeling of shortness of breath.  She denied any nausea or vomiting.  No fever.  No chills.  No rigor.  She also denies any history of PND or orthopnea. When the patient symptoms persisted, she claimed that she sat down to rest and subsequently was brought to the emergency room.  In the ED, symptoms were said to have abated and after evaluation, she was advised to be admitted for further workup.  PAST MEDICAL HISTORY:  Positive for anxiety disorder and dyslipidemia.  PAST SURGICAL HISTORY:  Cystectomy, right breast and  hysterectomy.  MEDICATIONS:  Her preadmission meds include fish oil dose unknown and vitamin E, D-alpha dose unknown.  ALLERGIES:  To MOTRIN.  SOCIAL HISTORY:  Negative for tobacco use and occasionally takes alcohol.  FAMILY HISTORY:  York Spaniel to be positive for diabetes mellitus and coronary artery disease.  REVIEW OF SYSTEMS:  The patient denies any history of headaches.  No blurry vision.  No nausea or vomiting.  No fever.  No chills.  No rigor. No chest pain or shortness of breath.  Denies any cough.  The numbness on the right side of the face and feeling of trying to pass out had resolved.  She denies any PND or orthopnea.  No abdominal discomfort. No diarrhea or hematochezia.  No dysuria or hematuria.  No swelling of the lower extremities.  No intolerance to heat and cold and no neuropsychiatric disorder.  PHYSICAL EXAMINATION:  GENERAL:  Very pleasant middle-aged lady well- hydrated, not in any obvious respiratory distress at present,anxious VITAL SIGNS:  Blood pressure is 141/121, pulse is 87, respiratory rate is 18 and temperature is 98.7.  HEENT:  Pupils are reactive to light and extraocular muscles are intact. NECK:  No jugular venous distention.  No carotid bruit.  No lymphadenopathy. CHEST:  Clear to auscultation. CARDIAC:  Heart sounds are 1 and 2. ABDOMEN:  Soft and nontender.  Liver, spleen and kidney are not palpable.  Bowel sounds are positive. EXTREMITIES:  No pedal edema. NEUROLOGIC:  Speech to be appropriate.  Muscle power right upper extremity 4/5, right lower extremity 4/5, left upper extremity 4/5, left lower extremity 5/5.  Sensory sensation intact. MUSCULOSKELETAL SYSTEM:  Unremarkable. NEUROPSYCHIATRIC EVALUATION:  Unremarkable. SKIN:  Normal turgor.  LABORATORY DATA:  Urinalysis done on the patient was unremarkable. Hematological indices showed WBC of 5.1, hemoglobin 11.3, hematocrit 34.1, MCV 91.2 with a platelet count of 396.  Chemistry show a  sodium of 140, potassium of 3.7, chloride of 107 with a bicarb of 28, glucose is 92, BUN is 9 and creatinine 0.87.  First set of cardiac markers troponin- I less than 0.05, CK-MB 1.6, normal.  EKG showed normal sinus rhythm with no acute ST-wave changes noted.  Imaging studies done on the patient include CT of the head without contrast which showed no acute intracranial abnormality.  There is  mucosal thickening with air- fluid level.  Right maxillary sinus was suspicious for sinusitis and chest x-ray showed borderline cardiomegaly with mild thoracic spondylosis.  IMPRESSION: 1. Presyncope, questionable transient ischemic attack. 2. Anxiety disorder. 3. Dyslipidemia. 4. Elevated blood pressure  PLAN: 1. To admit the patient to telemetry.  The patient will be saline     lock.  She will be on aspirin 325 mg p.o. daily, Zocor 20 mg p.o.     daily and her blood pressure will be controlled with lisinopril 10     mg p.o. daily.Protonix 40 mg p.o daily for GI prophylaxis and lovenox 40 mg     subcut for DVT prophylaxis.Xanax 0.25 mg p.o bid prn for anxiety. 2. Further labs to be ordered on this patient will include cardiac     enzymes q.6 x3, thyroid panel which include TSH, T3 and T4,     hemoglobin A1c, lipid panel, magnesium level stat, CBC, CMP and     magnesium repeated in a.m.  Imaging studies to be ordered will     include 2-D echo,Carotid duplex and MRI and MRA of the head and neck.      The patient will be evaluated with imaging and lab results and she     will be followed on a daily basis.     Talmage Nap, MD     CN/MEDQ  D:  02/24/2011  T:  02/24/2011  Job:  [id number]  Electronically Signed by Talmage Nap  on 02/24/2011 10:33:57 PM

## 2011-02-25 ENCOUNTER — Inpatient Hospital Stay (HOSPITAL_COMMUNITY): Payer: Self-pay

## 2011-02-25 DIAGNOSIS — I517 Cardiomegaly: Secondary | ICD-10-CM

## 2011-02-25 LAB — DIFFERENTIAL
Basophils Absolute: 0 10*3/uL (ref 0.0–0.1)
Basophils Relative: 0 % (ref 0–1)
Eosinophils Relative: 6 % — ABNORMAL HIGH (ref 0–5)
Lymphs Abs: 2.7 10*3/uL (ref 0.7–4.0)
Monocytes Absolute: 0.4 10*3/uL (ref 0.1–1.0)
Monocytes Relative: 8 % (ref 3–12)

## 2011-02-25 LAB — TSH: TSH: 2.413 u[IU]/mL (ref 0.350–4.500)

## 2011-02-25 LAB — COMPREHENSIVE METABOLIC PANEL
Albumin: 3.3 g/dL — ABNORMAL LOW (ref 3.5–5.2)
Alkaline Phosphatase: 40 U/L (ref 39–117)
BUN: 9 mg/dL (ref 6–23)
Creatinine, Ser: 0.84 mg/dL (ref 0.4–1.2)
GFR calc Af Amer: >60 mL/min (ref >60)
Glucose, Bld: 98 mg/dL (ref 70–99)
Sodium: 141 mEq/L (ref 135–145)
Total Protein: 6.8 g/dL (ref 6.0–8.3)

## 2011-02-25 LAB — T3: T3, Total: 96.5 ng/dl (ref 80.0–204.0)

## 2011-02-25 LAB — CBC
HCT: 34.7 % — ABNORMAL LOW (ref 36.0–46.0)
Hemoglobin: 11.4 g/dL — ABNORMAL LOW (ref 12.0–15.0)
MCHC: 32.9 g/dL (ref 30.0–36.0)
RBC: 3.8 MIL/uL — ABNORMAL LOW (ref 3.87–5.11)
WBC: 5.5 10*3/uL (ref 4.0–10.5)

## 2011-02-25 LAB — CARDIAC PANEL(CRET KIN+CKTOT+MB+TROPI): Total CK: 162 U/L (ref 7–177)

## 2011-02-25 LAB — HEMOGLOBIN A1C
Hgb A1c MFr Bld: 5.9 % — ABNORMAL HIGH (ref <5.7)
Mean Plasma Glucose: 123 mg/dL — ABNORMAL HIGH (ref <117)

## 2011-02-25 LAB — LIPID PANEL: Cholesterol: 234 mg/dL — ABNORMAL HIGH (ref 0–200)

## 2011-02-26 LAB — BASIC METABOLIC PANEL
CO2: 29 mEq/L (ref 19–32)
Calcium: 9.4 mg/dL (ref 8.4–10.5)
Chloride: 103 mEq/L (ref 96–112)
Glucose, Bld: 102 mg/dL — ABNORMAL HIGH (ref 70–99)
Potassium: 3.9 mEq/L (ref 3.5–5.1)
Sodium: 139 mEq/L (ref 135–145)

## 2011-02-26 LAB — CBC
HCT: 35.6 % — ABNORMAL LOW (ref 36.0–46.0)
Hemoglobin: 11.6 g/dL — ABNORMAL LOW (ref 12.0–15.0)
MCHC: 32.6 g/dL (ref 30.0–36.0)

## 2011-02-26 LAB — DIFFERENTIAL
Basophils Absolute: 0 10*3/uL (ref 0.0–0.1)
Lymphocytes Relative: 47 % — ABNORMAL HIGH (ref 12–46)
Monocytes Absolute: 0.4 10*3/uL (ref 0.1–1.0)
Neutro Abs: 1.7 10*3/uL (ref 1.7–7.7)

## 2011-02-28 LAB — URINE CULTURE

## 2011-03-05 ENCOUNTER — Ambulatory Visit (INDEPENDENT_AMBULATORY_CARE_PROVIDER_SITE_OTHER): Payer: Self-pay | Admitting: Family Medicine

## 2011-03-05 ENCOUNTER — Encounter: Payer: Self-pay | Admitting: Family Medicine

## 2011-03-05 VITALS — BP 112/70 | HR 100 | Resp 16 | Ht 65.0 in | Wt 156.0 lb

## 2011-03-05 DIAGNOSIS — G459 Transient cerebral ischemic attack, unspecified: Secondary | ICD-10-CM

## 2011-03-05 DIAGNOSIS — F329 Major depressive disorder, single episode, unspecified: Secondary | ICD-10-CM

## 2011-03-05 DIAGNOSIS — R29818 Other symptoms and signs involving the nervous system: Secondary | ICD-10-CM

## 2011-03-05 DIAGNOSIS — E785 Hyperlipidemia, unspecified: Secondary | ICD-10-CM

## 2011-03-05 DIAGNOSIS — F3289 Other specified depressive episodes: Secondary | ICD-10-CM

## 2011-03-05 DIAGNOSIS — R296 Repeated falls: Secondary | ICD-10-CM

## 2011-03-05 MED ORDER — CITALOPRAM HYDROBROMIDE 20 MG PO TABS: 20.0000 mg | ORAL_TABLET | Freq: Every day | ORAL | Status: DC

## 2011-03-05 MED ORDER — OMEGA-3 FATTY ACIDS 1000 MG PO CAPS: 2.0000 g | ORAL_CAPSULE | Freq: Every day | ORAL | Status: DC

## 2011-03-05 NOTE — Progress Notes (Signed)
  Subjective:    Patient ID: [name], female    DOB: 1951/10/17, 60 y.o.   MRN: [id number]  HPI Pt in for hospital f/u for TIA. She had no abnormality on brain scan and denies any residual weakness or numbness.  She is in with her daughter who is in the [employer], currently serving in [place], and is requesting an additional week from return to assist with her mother, who is also currently  On driving restrictions.Her mother has the challenge of caring for a handicap child and her finances are a challenge. Her daughter states she is in the process of having her mother be  Made a dependent on her which will be to her financial benefit. [name] has reportedly been falling at home , and she will therefore need some f/u with neurology. She has no specific c/o joint pain. She states she feels pressured by people and is told what to do. Feels cornered. Was told to stop antidepressant med which she did 1 month ago an has ben on a downhill course ever since.    Review of Systems Denies recent fever or chills. Denies sinus pressure, nasal congestion, ear pain or sore throat. Denies chest congestion, productive cough or wheezing. Denies chest pains, palpitations, paroxysmal nocturnal dyspnea, orthopnea and leg swelling Denies abdominal pain, nausea, vomiting,diarrhea or constipation.  Denies rectal bleeding or change in bowel movement. Denies dysuria, frequency, hesitancy or incontinence. Denies joint pain, reports recurrent falls Reports depression and  anxiety , not suicidal or homicidal Denies skin break down or rash.        Objective:   Physical Exam Patient alert and oriented and in no Cardiopulmonary distress.  HEENT: No facial asymmetry, EOMI,  Oropharynx pink and moist.  Neck supple no adenopathy.  Chest: Clear to auscultation bilaterally.  CVS: S1, S2 no murmurs, no S3.  ABD: Soft non tender. Bowel sounds normal.  Ext: No edema  Ann: Adequate ROM spine, shoulders, hips and  knees.  Skin: Intact, no ulcerations or rash noted.  Psych: poor eye contact, flat affect. depressed appearing.  CNS: CN 2-12  intact, power, tone and sensation normal throughout.        Assessment & Plan:

## 2011-03-05 NOTE — Patient Instructions (Signed)
F/u in 6 weeks.  You need to start back on citalopram one daily, also, pls take fish opil , two daily.

## 2011-03-06 ENCOUNTER — Encounter: Payer: Self-pay | Admitting: Family Medicine

## 2011-03-06 DIAGNOSIS — R296 Repeated falls: Secondary | ICD-10-CM | POA: Insufficient documentation

## 2011-03-06 DIAGNOSIS — G459 Transient cerebral ischemic attack, unspecified: Secondary | ICD-10-CM | POA: Insufficient documentation

## 2011-03-06 NOTE — Assessment & Plan Note (Signed)
Uncontrolled, however due to myalgia complaint, will hold off of statin therapy and use fish oil Low fat diet encouraged

## 2011-03-06 NOTE — Assessment & Plan Note (Signed)
H/o repeated falls with recentTIA, will have neurology follow pt, since no specific joint complaint

## 2011-03-06 NOTE — Assessment & Plan Note (Signed)
Deteriorated, will resume med, therapy offered, not comitting at this time, finances are a challenge

## 2011-03-07 ENCOUNTER — Other Ambulatory Visit: Payer: Self-pay | Admitting: Family Medicine

## 2011-03-12 NOTE — Op Note (Signed)
[name], [name]                   ACCOUNT NO.:  [id number]   MEDICAL RECORD NO.:  [id number]          PATIENT TYPE:  AMB   LOCATION:  DAY                           FACILITY:  [facility]   PHYSICIAN:  Dalia Heading, M.D.  DATE OF BIRTH:  [birth year]   DATE OF PROCEDURE:  08/02/2008  DATE OF DISCHARGE:                               OPERATIVE REPORT   PREOPERATIVE DIAGNOSIS:  Need for screening colonoscopy.   POSTOPERATIVE DIAGNOSIS:  Need for screening colonoscopy.   PROCEDURE:  Colonoscopy.   SURGEON:  Dalia Heading, MD   ANESTHESIA:  1. Demerol 50 mg IV.  2. Versed 6 mg IV.   INDICATIONS:  The patient is a 60 year old black female who is referred  for endoscopic evaluation.  She needs a colonoscopy for screening  purposes.  The risks and benefits of the procedure including bleeding  and perforation were fully explained to the patient and gave informed  consent.   PROCEDURE NOTE:  The patient was placed in left lateral decubitus  position after placement of monitored anesthesia care.  Demerol and  Versed were used throughout the procedure for anesthesia.  Rectal  examination was performed, which was unremarkable.  The endoscope was  advanced to the cecum with moderate difficulty due to tortuosity of the  colon.  The bowel preparation was adequate.  The cecum was visualized  from a distance and was noted be within normal limits.  The ascending  colon, transverse colon, descending colon, sigmoid colon, and rectum  were all within normal limits.  All air was then evacuated from the  colon prior to removal of the endoscope.   The patient tolerated the procedure well and was transferred back to  PACU in stable condition.   COMPLICATIONS:  None.   SPECIMEN:  None.   RECOMMENDATIONS:  It is recommended that the patient have a followup  colonoscopy in 10 years for screening purposes.      Dalia Heading, M.D.  Electronically Signed     MAJ/MEDQ  D:  08/02/2008  T:   08/02/2008  Job:  [id number]   cc:   Milus Mallick. Lodema Hong, M.D.  Fax: [phone]

## 2011-03-12 NOTE — H&P (Signed)
NAMEKATHRENE, [name]                   ACCOUNT NO.:  [id number]   MEDICAL RECORD NO.:  [id number]          PATIENT TYPE:  AMB   LOCATION:  DAY                           FACILITY:  [facility]   PHYSICIAN:  Dalia Heading, M.D.  DATE OF BIRTH:  [birth year]   DATE OF ADMISSION:  DATE OF DISCHARGE:  LH                              HISTORY & PHYSICAL   CHIEF COMPLAINT:  Need for screening colonoscopy.   HISTORY OF PRESENT ILLNESS:  The patient is a 60 year old black female  who was referred for endoscopic evaluation.  She needs a colonoscopy was  screening purposes.  No abdominal pain, weight loss, nausea, vomiting,  diarrhea, constipation, melena, or hematochezia have been noted.  She  has never had a colonoscopy.  There is no family history of colon  carcinoma.   PAST MEDICAL HISTORY:  High cholesterol levels.   PAST SURGICAL HISTORY:  Unremarkable.   CURRENT MEDICATIONS:  Crestor, omeprazole, and citalopram.   ALLERGIES:  No known drug allergies.   REVIEW OF SYSTEMS:  Noncontributory.   PHYSICAL EXAMINATION:  GENERAL:  The patient is a well-developed, well-  nourished black female in no acute distress.  LUNGS:  Clear to auscultation with equal breath sounds bilaterally.  HEART:  Reveals regular rate and rhythm without S3, S4, or murmurs.  ABDOMEN:  Soft, nontender, and nondistended.  No hepatosplenomegaly or  masses are noted.  RECTAL:  Deferred to the procedure.   IMPRESSION:  Need for screening colonoscopy.   PLAN:  The patient is scheduled for a colonoscopy on August 02, 2008.  The risks and benefits of the procedure including bleeding and  perforation were fully explained to the patient, gave informed consent.      Dalia Heading, M.D.  Electronically Signed     MAJ/MEDQ  D:  07/12/2008  T:  07/13/2008  Job:  [id number]   cc:   Milus Mallick. Lodema Hong, M.D.  Fax: [phone]

## 2011-03-20 ENCOUNTER — Other Ambulatory Visit: Payer: Self-pay | Admitting: Family Medicine

## 2011-03-21 NOTE — Discharge Summary (Signed)
NAMENOVELLA, ABRAHA                   ACCOUNT NO.:  [id number]  MEDICAL RECORD NO.:  [id number]           PATIENT TYPE:  O  LOCATION:  A335                          FACILITY:  [facility]  PHYSICIAN:  Peggye Pitt, M.D. DATE OF BIRTH:  1951/08/21  DATE OF ADMISSION:  02/24/2011 DATE OF DISCHARGE:  05/01/2012LH                              DISCHARGE SUMMARY   PRIMARY CARE PHYSICIAN:  Milus Mallick. Lodema Hong, MD  DISCHARGE DIAGNOSES: 1. Transient ischemic attack. 2. Presyncopal episode. 3. Myalgias, likely statin-induced. 4. Hyperlipidemia. 5. Anxiety disorder.  DISCHARGE MEDICATIONS: 1. Aspirin 81 mg daily. 2. Fish oil 1000 mg 3 capsules daily. 3. Loratadine 10 mg daily. 4. Vitamin B12 one tablet daily. 5. Vitamin E 400 International Units 2 capsules daily.  She also takes black cohosh 2 capsules daily.  She has been instructed to discontinue her Crestor at this time.  DISPOSITION AND FOLLOWUP:  Ms. Henault will be discharged home today in stable and improved condition pending results of the PT evaluation today.  Please note that she will need to follow up with her PCP in 2-3 weeks as all of her stroke workup including echo and carotid Dopplers have been performed, however, results are pending at time of dictation as the patient is very anxious to go home at the moment.  CONSULTATION THIS HOSPITALIZATION:  None.  IMAGES AND PROCEDURES: 1. Chest x-ray on February 24, 2011 that showed borderline cardiomegaly     and mild thoracic spondylosis. 2. Carotid Dopplers, results are pending. 3. 2-D echocardiogram, results are pending. 4. CT scan of the head without contrast on February 24, 2011 that showed     no acute intracranial abnormality.  Mucosal thickening with air-     fluid level in the right maxillary sinus suspicious for sinusitis.  HISTORY AND PHYSICAL:  For complete details, please refer to dictation by Dr. Beverly Gust on February 24, 2011, but in brief Ms. Dicostanzo is a 60 year old  African American lady who presented to the hospital with numbness of the right side of the face and unsteady gait while at church.  She was singing in the choir when about 10 o'clock in the morning she noted some numbness over the right side of her face, especially around the mouth associated with dizziness and trying to pass out.  She was also said to have had an unsteady gait, no slurred speech.  Because of that, she was brought into the hospital for further evaluation and management.  HOSPITAL COURSE BY PROBLEM: 1. Transient ischemic attack.  All of her neurologic symptoms resolved     by the time she presented to the hospital.  She has been continued     on her aspirin 81 mg daily.  Results of the PT evaluation are     pending.  Given her falls, her daughter was very concerned that she     has some sort of a gait disturbance, so we will see what PT     recommends.  It is likely that she may need some home health PT     plus/minus some equipment at home.  A fasting lipid profile has     been ordered with results as follows:  Total cholesterol 234,     triglycerides 208, HDL 60, and an LDL of 132.  Her statin has been     discontinued, please see below for details. 2. Myalgias.  She states that these are most noticeable over her thigh     and calf area.  They significantly disturbs her gait including most     significantly when she wants to go upstairs.  I believe at this     point that this may be an indication of statin-induced myalgias.  I     have ordered a CK level.  This has been slightly elevated at 191.     I have discontinued her statin for now.  She will need to follow up     with Dr. Lodema Hong to see at which time it is prudent to be     restarted. 3. Rest of chronic conditions are stable.  DISCHARGE VITAL SIGNS:  Blood pressure 109/73, heart rate 62, respirations 19, sats 99% on room air, and temperature 97.7.     Peggye Pitt, M.D.     EH/MEDQ  D:  02/26/2011   T:  02/26/2011  Job:  [id number]  Electronically Signed by Peggye Pitt M.D. on 03/21/2011 08:32:12 AM

## 2011-04-15 ENCOUNTER — Ambulatory Visit: Payer: Self-pay | Admitting: Family Medicine

## 2011-05-23 ENCOUNTER — Encounter: Payer: Self-pay | Admitting: Family Medicine

## 2011-05-28 ENCOUNTER — Ambulatory Visit: Payer: Self-pay | Admitting: Family Medicine

## 2011-06-25 ENCOUNTER — Encounter: Payer: Self-pay | Admitting: Family Medicine

## 2011-06-26 ENCOUNTER — Ambulatory Visit: Payer: Self-pay | Admitting: Family Medicine

## 2011-06-27 ENCOUNTER — Encounter: Payer: Self-pay | Admitting: Family Medicine

## 2011-07-19 ENCOUNTER — Encounter: Payer: Self-pay | Admitting: Family Medicine

## 2011-07-22 ENCOUNTER — Encounter: Payer: Self-pay | Admitting: Family Medicine

## 2011-07-22 ENCOUNTER — Ambulatory Visit (INDEPENDENT_AMBULATORY_CARE_PROVIDER_SITE_OTHER): Payer: Self-pay | Admitting: Family Medicine

## 2011-07-22 VITALS — BP 140/90 | HR 78 | Resp 16 | Ht 63.0 in | Wt 158.0 lb

## 2011-07-22 DIAGNOSIS — E785 Hyperlipidemia, unspecified: Secondary | ICD-10-CM

## 2011-07-22 DIAGNOSIS — F329 Major depressive disorder, single episode, unspecified: Secondary | ICD-10-CM

## 2011-07-22 DIAGNOSIS — F341 Dysthymic disorder: Secondary | ICD-10-CM

## 2011-07-22 DIAGNOSIS — L299 Pruritus, unspecified: Secondary | ICD-10-CM | POA: Insufficient documentation

## 2011-07-22 DIAGNOSIS — F418 Other specified anxiety disorders: Secondary | ICD-10-CM

## 2011-07-22 MED ORDER — METHYLPREDNISOLONE ACETATE 80 MG/ML IJ SUSP
80.0000 mg | Freq: Once | INTRAMUSCULAR | Status: AC
  Administered 2011-07-22: 80 mg via INTRAMUSCULAR

## 2011-07-22 MED ORDER — GABAPENTIN 300 MG PO CAPS: ORAL_CAPSULE | ORAL | Status: DC

## 2011-07-22 MED ORDER — CITALOPRAM HYDROBROMIDE 20 MG PO TABS: 20.0000 mg | ORAL_TABLET | Freq: Every day | ORAL | Status: DC

## 2011-07-22 MED ORDER — PREDNISONE 5 MG PO KIT: 5.0000 mg | PACK | ORAL | Status: AC

## 2011-07-22 NOTE — Patient Instructions (Addendum)
F/U  Mid February  New med sent in for itching on the buttock , prednisone and gabapentin.Depomedrol injection in the office today  LABWORK  NEEDS TO BE DONE BETWEEN 3 TO 7 DAYS BEFORE YOUR NEXT SCEDULED  VISIT.  THIS WILL IMPROVE THE QUALITY OF YOUR CARE.   Fasting labs in [place] before visit.  Pls check social services to see if you qualify for medicare or medicaid

## 2011-07-22 NOTE — Assessment & Plan Note (Signed)
Improved on current med, will continue same

## 2011-07-22 NOTE — Progress Notes (Signed)
  Subjective:    Patient ID: [name], female    DOB: 1951-04-04, 60 y.o.   MRN: [id number]  HPI C/o uncontrolled itching on right buttock for the past 5 months, ever since her sister died unexpectedly, never had a rash . Reports improvement with the citalopram as far as anxiety and depression are concerned Reports continued left  Ear pain , worse with opening the jaw, however continues to chew excessively   Review of Systems See HPI Denies recent fever or chills. Denies sinus pressure, nasal congestion, ear pain or sore throat. Denies chest congestion, productive cough or wheezing. Denies chest pains, palpitations and leg swelling Denies abdominal pain, nausea, vomiting,diarrhea or constipation.   Denies dysuria, frequency, hesitancy or incontinence. Denies joint pain, swelling and limitation in mobility. Denies headaches, seizures, numbness, or tingling. Denies uncontrolled  depression, anxiety or insomnia. Denies skin break down or rash.        Objective:   Physical Exam Patient alert and oriented and in no cardiopulmonary distress.  HEENT: No facial asymmetry, EOMI, no sinus tenderness,  oropharynx pink and moist.  Neck supple no adenopathy.  Chest: Clear to auscultation bilaterally.  CVS: S1, S2 no murmurs, no S3.  ABD: Soft non tender. Bowel sounds normal.  Ext: No edema  MS: Adequate ROM spine, shoulders, hips and knees.  Skin: Intact, no ulcerations  Noted.Rash of healed scars on right buttock where pt has been scratching excessively  Psych: Good eye contact, normal affect. Memory intact not anxious or depressed appearing.  CNS: CN 2-12 intact, power, tone and sensation normal throughout.        Assessment & Plan:

## 2011-07-22 NOTE — Assessment & Plan Note (Signed)
Hyperlipidemia:Low fat diet discussed and encouraged.  Labs prior to return visit

## 2011-07-22 NOTE — Assessment & Plan Note (Signed)
Uncontrolled itching, depo medrol administered and med also sent in , as well as gabapentin, deep burning pain in buttock likell due to nerve irritation

## 2011-12-09 ENCOUNTER — Ambulatory Visit: Payer: Self-pay | Admitting: Family Medicine

## 2012-01-16 ENCOUNTER — Other Ambulatory Visit: Payer: Self-pay | Admitting: Family Medicine

## 2012-01-28 ENCOUNTER — Ambulatory Visit (INDEPENDENT_AMBULATORY_CARE_PROVIDER_SITE_OTHER): Payer: BC Managed Care – PPO | Admitting: Family Medicine

## 2012-01-28 ENCOUNTER — Encounter: Payer: Self-pay | Admitting: Family Medicine

## 2012-01-28 VITALS — BP 130/80 | HR 95 | Resp 18 | Ht 63.0 in | Wt 162.1 lb

## 2012-01-28 DIAGNOSIS — F329 Major depressive disorder, single episode, unspecified: Secondary | ICD-10-CM

## 2012-01-28 DIAGNOSIS — J209 Acute bronchitis, unspecified: Secondary | ICD-10-CM

## 2012-01-28 DIAGNOSIS — E785 Hyperlipidemia, unspecified: Secondary | ICD-10-CM

## 2012-01-28 DIAGNOSIS — R5381 Other malaise: Secondary | ICD-10-CM

## 2012-01-28 MED ORDER — ALBUTEROL SULFATE (5 MG/ML) 0.5% IN NEBU
2.5000 mg | INHALATION_SOLUTION | Freq: Once | RESPIRATORY_TRACT | Status: AC
  Administered 2012-01-28: 2.5 mg via RESPIRATORY_TRACT

## 2012-01-28 MED ORDER — PREDNISONE (PAK) 5 MG PO TABS: 5.0000 mg | ORAL_TABLET | ORAL | Status: DC

## 2012-01-28 MED ORDER — PENICILLIN V POTASSIUM 500 MG PO TABS: 500.0000 mg | ORAL_TABLET | Freq: Three times a day (TID) | ORAL | Status: AC

## 2012-01-28 MED ORDER — METHYLPREDNISOLONE ACETATE 80 MG/ML IJ SUSP
80.0000 mg | Freq: Once | INTRAMUSCULAR | Status: AC
  Administered 2012-01-28: 80 mg via INTRAMUSCULAR

## 2012-01-28 MED ORDER — PROMETHAZINE-DM 6.25-15 MG/5ML PO SYRP: ORAL_SOLUTION | ORAL | Status: AC

## 2012-01-28 MED ORDER — CEFTRIAXONE SODIUM 1 G IJ SOLR
500.0000 mg | Freq: Once | INTRAMUSCULAR | Status: AC
  Administered 2012-01-28: 500 mg via INTRAMUSCULAR

## 2012-01-28 MED ORDER — IPRATROPIUM BROMIDE 0.02 % IN SOLN
0.5000 mg | Freq: Once | RESPIRATORY_TRACT | Status: AC
  Administered 2012-01-28: 0.5 mg via RESPIRATORY_TRACT

## 2012-01-28 MED ORDER — BENZONATATE 100 MG PO CAPS: 100.0000 mg | ORAL_CAPSULE | Freq: Four times a day (QID) | ORAL | Status: DC | PRN

## 2012-01-28 NOTE — Patient Instructions (Signed)
CPE in 2 month.  You are being treated for acute bronchitis. Breathing treatment in the office, also rocephin and depo medrol im . Medication is sent to your pharmacy, take all as prescribed please.  You need a cXR.   CBC and diff, chem 7, lipid, hepatic , TSH today.  Please scheduleyour mammogram , you have not had one in 2 years

## 2012-01-28 NOTE — Progress Notes (Signed)
  Subjective:    Patient ID: [name], female    DOB: Oct 25, 1951, 61 y.o.   MRN: [id number]  HPI 2 week h/o chest tightness with productive cough and yellow sputum, intermittent chills. Fatigue and poor sleep. Denies sinus pressure . Reports good  Control of depression and anxiety   Review of Systems See HPI  Denies chest pains, palpitations and leg swelling Denies abdominal pain, nausea, vomiting,diarrhea or constipation.   Denies dysuria, frequency, hesitancy or incontinence. Denies joint pain, swelling and limitation in mobility. Denies headaches, seizures, numbness, or tingling. Denies uncontrolled depression, anxiety or insomnia. Denies skin break down or rash.        Objective:   Physical Exam Patient alert and oriented and in no cardiopulmonary distress.  HEENT: No facial asymmetry, EOMI, no sinus tenderness,  oropharynx pink and moist.  Neck supple no adenopathy.  Chest: decreased air entry bilaterally scattered crackles  CVS: S1, S2 no murmurs, no S3.  ABD: Soft non tender. Bowel sounds normal.  Ext: No edema  MS: Adequate ROM spine, shoulders, hips and knees.  Skin: Intact, no ulcerations or rash noted.  Psych: Good eye contact, normal affect. Memory intact not anxious or depressed appearing.  CNS: CN 2-12 intact, power, tone and sensation normal throughout.        Assessment & Plan:

## 2012-01-29 ENCOUNTER — Ambulatory Visit (HOSPITAL_COMMUNITY)
Admission: RE | Admit: 2012-01-29 | Discharge: 2012-01-29 | Disposition: A | Discharge status: Home / Self Care | Payer: BC Managed Care – PPO | Source: Ambulatory Visit | Attending: Family Medicine | Admitting: Family Medicine

## 2012-01-29 DIAGNOSIS — J209 Acute bronchitis, unspecified: Secondary | ICD-10-CM | POA: Insufficient documentation

## 2012-01-29 DIAGNOSIS — R05 Cough: Secondary | ICD-10-CM | POA: Insufficient documentation

## 2012-01-29 DIAGNOSIS — R059 Cough, unspecified: Secondary | ICD-10-CM | POA: Insufficient documentation

## 2012-01-29 DIAGNOSIS — R0602 Shortness of breath: Secondary | ICD-10-CM | POA: Insufficient documentation

## 2012-01-29 MED ORDER — GABAPENTIN 300 MG PO CAPS: 300.0000 mg | ORAL_CAPSULE | Freq: Three times a day (TID) | ORAL | Status: DC

## 2012-01-29 MED ORDER — CITALOPRAM HYDROBROMIDE 20 MG PO TABS: 20.0000 mg | ORAL_TABLET | Freq: Every day | ORAL | Status: DC

## 2012-02-04 NOTE — Assessment & Plan Note (Signed)
Controlled, no change in medication  

## 2012-02-04 NOTE — Assessment & Plan Note (Signed)
Acute onset, antibiotics, steroid and antibiotics and neb treatment in office and med sent in

## 2012-02-04 NOTE — Assessment & Plan Note (Signed)
Hyperlipidemia:Low fat diet discussed and encouraged.   

## 2012-03-13 ENCOUNTER — Other Ambulatory Visit: Payer: Self-pay | Admitting: Family Medicine

## 2012-03-16 ENCOUNTER — Other Ambulatory Visit: Payer: Self-pay

## 2012-03-16 DIAGNOSIS — E785 Hyperlipidemia, unspecified: Secondary | ICD-10-CM

## 2012-03-16 MED ORDER — OMEGA-3 FATTY ACIDS 1000 MG PO CAPS: 2.0000 g | ORAL_CAPSULE | Freq: Every day | ORAL | Status: DC

## 2012-04-01 ENCOUNTER — Ambulatory Visit: Payer: BC Managed Care – PPO | Admitting: Family Medicine

## 2012-04-09 ENCOUNTER — Telehealth: Payer: Self-pay | Admitting: Family Medicine

## 2012-04-09 NOTE — Telephone Encounter (Signed)
Called patient and left message for them to return call at the office   

## 2012-04-13 NOTE — Telephone Encounter (Signed)
Printed and up front to collect

## 2012-04-16 ENCOUNTER — Ambulatory Visit: Payer: BC Managed Care – PPO | Admitting: Family Medicine

## 2012-04-16 ENCOUNTER — Encounter: Payer: BC Managed Care – PPO | Admitting: Family Medicine

## 2012-04-21 ENCOUNTER — Other Ambulatory Visit: Payer: Self-pay | Admitting: Family Medicine

## 2012-04-24 ENCOUNTER — Encounter: Payer: Self-pay | Admitting: Family Medicine

## 2012-06-08 ENCOUNTER — Other Ambulatory Visit: Payer: Self-pay | Admitting: Family Medicine

## 2012-06-09 LAB — HEPATIC FUNCTION PANEL
Albumin: 3.9 g/dL (ref 3.5–5.2)
Total Bilirubin: 0.3 mg/dL (ref 0.3–1.2)

## 2012-06-09 LAB — BASIC METABOLIC PANEL
BUN: 10 mg/dL (ref 6–23)
Calcium: 9.7 mg/dL (ref 8.4–10.5)
Creat: 0.9 mg/dL (ref 0.50–1.10)
Glucose, Bld: 96 mg/dL (ref 70–99)

## 2012-06-09 LAB — CBC WITH DIFFERENTIAL/PLATELET
Basophils Absolute: 0 10*3/uL (ref 0.0–0.1)
Basophils Relative: 0 % (ref 0–1)
Eosinophils Absolute: 0.4 10*3/uL (ref 0.0–0.7)
HCT: 34.9 % — ABNORMAL LOW (ref 36.0–46.0)
Hemoglobin: 11.8 g/dL — ABNORMAL LOW (ref 12.0–15.0)
MCH: 30.4 pg (ref 26.0–34.0)
MCHC: 33.8 g/dL (ref 30.0–36.0)
Monocytes Absolute: 0.4 10*3/uL (ref 0.1–1.0)
Monocytes Relative: 7 % (ref 3–12)
RDW: 13.5 % (ref 11.5–15.5)

## 2012-06-09 LAB — TSH: TSH: 1.679 u[IU]/mL (ref 0.350–4.500)

## 2012-07-13 ENCOUNTER — Ambulatory Visit (INDEPENDENT_AMBULATORY_CARE_PROVIDER_SITE_OTHER): Payer: BC Managed Care – PPO | Admitting: Family Medicine

## 2012-07-13 ENCOUNTER — Encounter: Payer: Self-pay | Admitting: Family Medicine

## 2012-07-13 VITALS — BP 126/74 | HR 91 | Resp 18 | Ht 63.0 in | Wt 158.0 lb

## 2012-07-13 DIAGNOSIS — F411 Generalized anxiety disorder: Secondary | ICD-10-CM

## 2012-07-13 DIAGNOSIS — Z2911 Encounter for prophylactic immunotherapy for respiratory syncytial virus (RSV): Secondary | ICD-10-CM

## 2012-07-13 DIAGNOSIS — E785 Hyperlipidemia, unspecified: Secondary | ICD-10-CM

## 2012-07-13 DIAGNOSIS — M549 Dorsalgia, unspecified: Secondary | ICD-10-CM | POA: Insufficient documentation

## 2012-07-13 DIAGNOSIS — N318 Other neuromuscular dysfunction of bladder: Secondary | ICD-10-CM

## 2012-07-13 DIAGNOSIS — Z23 Encounter for immunization: Secondary | ICD-10-CM

## 2012-07-13 DIAGNOSIS — F329 Major depressive disorder, single episode, unspecified: Secondary | ICD-10-CM

## 2012-07-13 HISTORY — DX: Dorsalgia, unspecified: M54.9

## 2012-07-13 MED ORDER — TOLTERODINE TARTRATE ER 2 MG PO CP24: 2.0000 mg | ORAL_CAPSULE | Freq: Every day | ORAL | Status: DC

## 2012-07-13 MED ORDER — GABAPENTIN 300 MG PO CAPS: ORAL_CAPSULE | ORAL | Status: DC

## 2012-07-13 NOTE — Assessment & Plan Note (Signed)
C/o uncontrolled low back pain , tingling and numbness, will inc gabapentin dose

## 2012-07-13 NOTE — Assessment & Plan Note (Signed)
Increased and uncontrolled symptoms, bladder retraining, exercise discussed, and med prescribed primarily for as needed use

## 2012-07-13 NOTE — Progress Notes (Signed)
  Subjective:    Patient ID: [name], female    DOB: 1951-06-23, 61 y.o.   MRN: [id number]  HPI The PT is here for follow up and re-evaluation of chronic medical conditions, medication management and review of any available recent lab and radiology data.  Preventive health is updated, specifically  Cancer screening and Immunization.   Questions or concerns regarding consultations or procedures which the PT has had in the interim are  addressed. The PT denies any adverse reactions to current medications since the last visit.  Concerned about possible memory  Also increased urinary incontinence in the pas 3 to 4 months with accidents Under increased stress coping with sick brother, overall doing fairly well and this will end next monthThere are no specific complaints    c   Review of Systems See HPI Denies recent fever or chills. Denies sinus pressure, nasal congestion, ear pain or sore throat. Denies chest congestion, productive cough or wheezing. Denies chest pains, palpitations and leg swelling Denies abdominal pain, nausea, vomiting,diarrhea or constipation.   Denies dysuria, frequency, hesitancy . Denies joint pain, swelling and limitation in mobility. Denies headaches, seizures, numbness, or tingling. Denies skin break down or rash.        Objective:   Physical Exam Mini mental status exam today, score of 28/30. Pt was concerned about possible memory loss Patient alert and oriented and in no cardiopulmonary distress.  HEENT: No facial asymmetry, EOMI, no sinus tenderness,  oropharynx pink and moist.  Neck supple no adenopathy.  Chest: Clear to auscultation bilaterally.  CVS: S1, S2 no murmurs, no S3.  ABD: Soft non tender. Bowel sounds normal.  Ext: No edema  MS: Adequate ROM spine, shoulders, hips and knees.  Skin: Intact, no ulcerations or rash noted.  Psych: Good eye contact, normal affect. Memory intact not anxious or depressed appearing.  CNS: CN 2-12  intact, power, tone and sensation normal throughout.       Assessment & Plan:

## 2012-07-13 NOTE — Assessment & Plan Note (Signed)
Improved and controlled on medication 

## 2012-07-13 NOTE — Patient Instructions (Addendum)
Pelvic and breast exam in  November  Fasting lipid panel this week please  Flu and shingles vaccines today   Dose increase on gabapentin to 300mg  TWICE daily   New medication for incontinence  Memmory eval today  New medication for incontinence, ok to use as needed

## 2012-07-13 NOTE — Assessment & Plan Note (Signed)
Updated lab needed Hyperlipidemia:Low fat diet discussed and encouraged.   

## 2012-07-13 NOTE — Assessment & Plan Note (Signed)
Under increased stress, no additional meds at this time

## 2012-07-15 LAB — LIPID PANEL
Cholesterol: 318 mg/dL — ABNORMAL HIGH (ref 0–200)
HDL: 59 mg/dL (ref >39)
Triglycerides: 155 mg/dL — ABNORMAL HIGH (ref <150)

## 2012-07-16 ENCOUNTER — Other Ambulatory Visit: Payer: Self-pay | Admitting: Family Medicine

## 2012-07-24 ENCOUNTER — Other Ambulatory Visit: Payer: Self-pay | Admitting: Family Medicine

## 2012-07-24 MED ORDER — OXYBUTYNIN CHLORIDE 5 MG PO TABS: 5.0000 mg | ORAL_TABLET | Freq: Two times a day (BID) | ORAL | Status: DC

## 2012-09-09 ENCOUNTER — Telehealth: Payer: Self-pay | Admitting: Family Medicine

## 2012-09-09 MED ORDER — ROSUVASTATIN CALCIUM 20 MG PO TABS: 20.0000 mg | ORAL_TABLET | Freq: Every day | ORAL | Status: DC

## 2012-09-09 NOTE — Telephone Encounter (Signed)
No samples available. Refill sent to [facility]

## 2012-10-08 ENCOUNTER — Telehealth: Payer: Self-pay | Admitting: Family Medicine

## 2012-10-08 MED ORDER — ROSUVASTATIN CALCIUM 20 MG PO TABS: 20.0000 mg | ORAL_TABLET | Freq: Every day | ORAL | Status: DC

## 2012-10-08 NOTE — Telephone Encounter (Signed)
Med resent 

## 2012-10-22 ENCOUNTER — Telehealth: Payer: Self-pay | Admitting: Family Medicine

## 2012-10-22 MED ORDER — LORATADINE 10 MG PO TABS: ORAL_TABLET | ORAL | Status: DC

## 2012-10-22 NOTE — Telephone Encounter (Signed)
Med sent.

## 2012-12-14 ENCOUNTER — Other Ambulatory Visit: Payer: Self-pay

## 2012-12-14 MED ORDER — FLUTICASONE PROPIONATE 50 MCG/ACT NA SUSP: 1.0000 | Freq: Every day | NASAL | Status: DC

## 2012-12-16 ENCOUNTER — Telehealth: Payer: Self-pay

## 2012-12-16 NOTE — Telephone Encounter (Signed)
She needs to get fasting lipid and hepatic panel done before any cholesterol med can be prescribed, pls let her kno and order tests

## 2012-12-16 NOTE — Telephone Encounter (Signed)
Pt aware and will go have lab done

## 2012-12-16 NOTE — Telephone Encounter (Signed)
Called patient on cell phone voicemail full and unable to accept messages.  Home phone no answer.

## 2012-12-16 NOTE — Telephone Encounter (Signed)
Crestor needs PA. Pt states she is willing to try another generic. Wants something sent in today [facility]

## 2012-12-31 LAB — LIPID PANEL
LDL Cholesterol: 147 mg/dL — ABNORMAL HIGH (ref 0–99)
Triglycerides: 154 mg/dL — ABNORMAL HIGH (ref <150)
VLDL: 31 mg/dL (ref 0–40)

## 2012-12-31 LAB — HEPATIC FUNCTION PANEL
Albumin: 4.2 g/dL (ref 3.5–5.2)
Indirect Bilirubin: 0.3 mg/dL (ref 0.0–0.9)
Total Bilirubin: 0.4 mg/dL (ref 0.3–1.2)
Total Protein: 7.4 g/dL (ref 6.0–8.3)

## 2013-02-01 ENCOUNTER — Ambulatory Visit: Payer: BC Managed Care – PPO | Admitting: Family Medicine

## 2013-02-08 ENCOUNTER — Telehealth: Payer: Self-pay | Admitting: Family Medicine

## 2013-02-09 ENCOUNTER — Ambulatory Visit: Payer: BC Managed Care – PPO | Admitting: Family Medicine

## 2013-02-11 ENCOUNTER — Other Ambulatory Visit: Payer: Self-pay

## 2013-02-11 ENCOUNTER — Telehealth: Payer: Self-pay | Admitting: Family Medicine

## 2013-02-11 MED ORDER — ROSUVASTATIN CALCIUM 20 MG PO TABS: 20.0000 mg | ORAL_TABLET | Freq: Every day | ORAL | Status: DC

## 2013-02-11 NOTE — Telephone Encounter (Signed)
Called patient and left message for them to return call at the office   

## 2013-02-11 NOTE — Telephone Encounter (Signed)
Spoke with patient and sent in medicine until she comes for appt.

## 2013-02-11 NOTE — Telephone Encounter (Signed)
Tried to call pt back again. No answer

## 2013-02-11 NOTE — Telephone Encounter (Signed)
Called patient again no answer

## 2013-02-17 NOTE — Telephone Encounter (Signed)
Courtney spoke with patient

## 2013-02-22 ENCOUNTER — Encounter: Payer: Self-pay | Admitting: Family Medicine

## 2013-02-22 ENCOUNTER — Ambulatory Visit (INDEPENDENT_AMBULATORY_CARE_PROVIDER_SITE_OTHER): Payer: BC Managed Care – PPO | Admitting: Family Medicine

## 2013-02-22 ENCOUNTER — Telehealth: Payer: Self-pay | Admitting: Family Medicine

## 2013-02-22 VITALS — BP 130/82 | HR 92 | Resp 18 | Ht 63.0 in | Wt 157.0 lb

## 2013-02-22 DIAGNOSIS — R5381 Other malaise: Secondary | ICD-10-CM

## 2013-02-22 DIAGNOSIS — R5383 Other fatigue: Secondary | ICD-10-CM

## 2013-02-22 DIAGNOSIS — N318 Other neuromuscular dysfunction of bladder: Secondary | ICD-10-CM

## 2013-02-22 DIAGNOSIS — J309 Allergic rhinitis, unspecified: Secondary | ICD-10-CM

## 2013-02-22 DIAGNOSIS — F329 Major depressive disorder, single episode, unspecified: Secondary | ICD-10-CM

## 2013-02-22 DIAGNOSIS — E785 Hyperlipidemia, unspecified: Secondary | ICD-10-CM

## 2013-02-22 MED ORDER — CITALOPRAM HYDROBROMIDE 20 MG PO TABS: ORAL_TABLET | ORAL | Status: DC

## 2013-02-22 MED ORDER — PRAVASTATIN SODIUM 40 MG PO TABS: 40.0000 mg | ORAL_TABLET | Freq: Every evening | ORAL | Status: DC

## 2013-02-22 MED ORDER — ROSUVASTATIN CALCIUM 20 MG PO TABS: 20.0000 mg | ORAL_TABLET | Freq: Every day | ORAL | Status: DC

## 2013-02-22 MED ORDER — LORATADINE 10 MG PO TABS: ORAL_TABLET | ORAL | Status: DC

## 2013-02-22 NOTE — Telephone Encounter (Signed)
noted 

## 2013-02-22 NOTE — Telephone Encounter (Signed)
Pt aware that pravachol is being sent inn-place of crestor so pls fax

## 2013-02-22 NOTE — Patient Instructions (Addendum)
CPE and pap in 4 month  Med refills for the next 5 month  Fasting lipid, cbc,  cmp, TSH in the next 4 month  Please schedule your mammogram in June when it is due, we will give you the number  I am sorry about your recent loss, please accept  My condolence  Colonscopy is not due till 2019  Please call and let us know if crestor is too high

## 2013-02-22 NOTE — Progress Notes (Signed)
  Subjective:    Patient ID: [name], female    DOB: [birth year], 62 y.o.   MRN: [id number]  HPI The PT is here for follow up and re-evaluation of chronic medical conditions, medication management and review of any available recent lab and radiology data.  Preventive health is updated, specifically  Cancer screening and Immunization.   The PT denies any adverse reactions to current medications since the last visit. Unable to afford crestor , recent lab shows uncontrolled hyperlipidemia There are no new concerns. Recently lost her brother who had Parkinsons, handling death as best as she can There are no specific complaints       Review of Systems See HPI Denies recent fever or chills. Denies sinus pressure, nasal congestion, ear pain or sore throat. Denies chest congestion, productive cough or wheezing. Denies chest pains, palpitations and leg swelling Denies abdominal pain, nausea, vomiting,diarrhea or constipation.   Denies dysuria, frequency, hesitancy or incontinence. Denies joint pain, swelling and limitation in mobility. Denies headaches, seizures, numbness, or tingling. Denies depression, anxiety or insomnia. Denies skin break down or rash.        Objective:   Physical Exam Patient alert and oriented and in no cardiopulmonary distress.  HEENT: No facial asymmetry, EOMI, no sinus tenderness,  oropharynx pink and moist.  Neck supple no adenopathy.  Chest: Clear to auscultation bilaterally.  CVS: S1, S2 no murmurs, no S3.  ABD: Soft non tender. Bowel sounds normal.  Ext: No edema  MS: Adequate ROM spine, shoulders, hips and knees.  Skin: Intact, no ulcerations or rash noted.  Psych: Good eye contact, normal affect. Memory intact not anxious or depressed appearing.  CNS: CN 2-12 intact, power, tone and sensation normal throughout.        Assessment & Plan:

## 2013-02-22 NOTE — Telephone Encounter (Signed)
Would you like for [place] to pa crestor? Or would you like to try lipitor?

## 2013-02-27 NOTE — Assessment & Plan Note (Signed)
Controlled, no change in medication  

## 2013-02-27 NOTE — Assessment & Plan Note (Signed)
Increased symptoms with season change pt to take both meds prescribed regulalrly

## 2013-02-27 NOTE — Assessment & Plan Note (Signed)
Uncontrolled, unable to afford medication  Will change to generic  Hyperlipidemia:Low fat diet discussed and encouraged.

## 2013-06-10 ENCOUNTER — Other Ambulatory Visit: Payer: Self-pay | Admitting: Family Medicine

## 2013-08-09 ENCOUNTER — Other Ambulatory Visit: Payer: Self-pay | Admitting: Family Medicine

## 2013-08-25 ENCOUNTER — Ambulatory Visit: Payer: BC Managed Care – PPO | Admitting: Family Medicine

## 2013-09-01 ENCOUNTER — Ambulatory Visit: Payer: BC Managed Care – PPO | Admitting: Family Medicine

## 2013-09-08 ENCOUNTER — Ambulatory Visit (INDEPENDENT_AMBULATORY_CARE_PROVIDER_SITE_OTHER): Payer: BC Managed Care – PPO | Admitting: Family Medicine

## 2013-09-08 ENCOUNTER — Encounter (INDEPENDENT_AMBULATORY_CARE_PROVIDER_SITE_OTHER): Payer: Self-pay

## 2013-09-08 ENCOUNTER — Encounter: Payer: Self-pay | Admitting: Family Medicine

## 2013-09-08 VITALS — BP 138/80 | HR 94 | Resp 16 | Ht 63.0 in | Wt 160.0 lb

## 2013-09-08 DIAGNOSIS — F411 Generalized anxiety disorder: Secondary | ICD-10-CM

## 2013-09-08 DIAGNOSIS — R5381 Other malaise: Secondary | ICD-10-CM

## 2013-09-08 DIAGNOSIS — E785 Hyperlipidemia, unspecified: Secondary | ICD-10-CM

## 2013-09-08 DIAGNOSIS — J309 Allergic rhinitis, unspecified: Secondary | ICD-10-CM

## 2013-09-08 DIAGNOSIS — M549 Dorsalgia, unspecified: Secondary | ICD-10-CM

## 2013-09-08 DIAGNOSIS — Z1239 Encounter for other screening for malignant neoplasm of breast: Secondary | ICD-10-CM

## 2013-09-08 DIAGNOSIS — F329 Major depressive disorder, single episode, unspecified: Secondary | ICD-10-CM

## 2013-09-08 DIAGNOSIS — N318 Other neuromuscular dysfunction of bladder: Secondary | ICD-10-CM

## 2013-09-08 DIAGNOSIS — F3289 Other specified depressive episodes: Secondary | ICD-10-CM

## 2013-09-08 MED ORDER — LORATADINE 10 MG PO TABS: ORAL_TABLET | ORAL | Status: DC

## 2013-09-08 MED ORDER — METHYLPREDNISOLONE ACETATE 80 MG/ML IJ SUSP
80.0000 mg | Freq: Once | INTRAMUSCULAR | Status: AC
  Administered 2013-09-08: 80 mg via INTRAMUSCULAR

## 2013-09-08 NOTE — Patient Instructions (Signed)
CPE and pap in 4.5 month, call if you need me before  Mammogram past due , pls stop at front for appt information  Fasting lipid, hepatic, CBC this week if possible please  Depo medrol 80mg  im today for uncontrolled allergy symptoms  Use loratidine and flonase every day for allergies Start salt water flushes of nostrils daily. OK to take sudafed one daily, as needed for excessive nasal pressure.  It is important that you exercise regularly at least 30 minutes 5 times a week. If you develop chest pain, have severe difficulty breathing, or feel very tired, stop exercising immediately and seek medical attention

## 2013-09-08 NOTE — Progress Notes (Signed)
  Subjective:    Patient ID: [name], female    DOB: 03-12-1951, 62 y.o.   MRN: [id number]  HPI The PT is here for follow up and re-evaluation of chronic medical conditions, medication management and review of any available recent lab and radiology data.  Preventive health is updated, specifically  Cancer screening and Immunization. Mammogram is past due, also pap  . The PT denies any adverse reactions to current medications since the last visit.  There are no new concerns.  There are no specific complaints       Review of Systems See HPI Denies recent fever or chills. Denies sinus pressure, nasal congestion, ear pain or sore throat.Increased allergy symptoms with season change, as expected Denies chest congestion, productive cough or wheezing. Denies chest pains, palpitations and leg swelling Denies abdominal pain, nausea, vomiting,diarrhea or constipation.   Denies dysuria, frequency, hesitancy or incontinence. Denies uncontrolled  joint pain, swelling and limitation in mobility. Denies headaches, seizures, numbness, or tingling. Denies uncontrolled  depression, anxiety or insomnia. Denies skin break down or rash.        Objective:   Physical Exam  Patient alert and oriented and in no cardiopulmonary distress.  HEENT: No facial asymmetry, EOMI, no sinus tenderness,  oropharynx pink and moist.  Neck supple no adenopathy.  Chest: Clear to auscultation bilaterally.  CVS: S1, S2 no murmurs, no S3.  ABD: Soft non tender. Bowel sounds normal.  Ext: No edema  MS: Adequate ROM spine, shoulders, hips and knees.  Skin: Intact, no ulcerations or rash noted.  Psych: Good eye contact, normal affect. Memory intact not anxious or depressed appearing.  CNS: CN 2-12 intact, power, tone and sensation normal throughout.       Assessment & Plan:

## 2013-09-11 ENCOUNTER — Other Ambulatory Visit: Payer: Self-pay | Admitting: Family Medicine

## 2013-09-11 LAB — CBC WITH DIFFERENTIAL/PLATELET
Basophils Absolute: 0 10*3/uL (ref 0.0–0.1)
Basophils Relative: 0 % (ref 0–1)
Eosinophils Absolute: 0.3 10*3/uL (ref 0.0–0.7)
Eosinophils Relative: 5 % (ref 0–5)
Hemoglobin: 11.7 g/dL — ABNORMAL LOW (ref 12.0–15.0)
MCH: 30.2 pg (ref 26.0–34.0)
MCHC: 33.4 g/dL (ref 30.0–36.0)
MCV: 90.4 fL (ref 78.0–100.0)
Neutrophils Relative %: 27 % — ABNORMAL LOW (ref 43–77)
Platelets: 373 10*3/uL (ref 150–400)
RDW: 14.7 % (ref 11.5–15.5)

## 2013-09-11 LAB — HEPATIC FUNCTION PANEL
ALT: 17 U/L (ref 0–35)
AST: 22 U/L (ref 0–37)
Alkaline Phosphatase: 48 U/L (ref 39–117)
Bilirubin, Direct: 0.1 mg/dL (ref 0.0–0.3)
Indirect Bilirubin: 0.3 mg/dL (ref 0.0–0.9)
Total Protein: 8 g/dL (ref 6.0–8.3)

## 2013-09-11 LAB — LIPID PANEL
Cholesterol: 258 mg/dL — ABNORMAL HIGH (ref 0–200)
LDL Cholesterol: 170 mg/dL — ABNORMAL HIGH (ref 0–99)
Triglycerides: 149 mg/dL (ref <150)
VLDL: 30 mg/dL (ref 0–40)

## 2013-09-12 NOTE — Assessment & Plan Note (Addendum)
Increased symptoms with season change.Depo medrol in office today Pt to commit to daily use of medications

## 2013-09-12 NOTE — Assessment & Plan Note (Signed)
Controlled, no change in medication  

## 2013-09-12 NOTE — Assessment & Plan Note (Signed)
Improved and controlled on medication 

## 2013-09-12 NOTE — Assessment & Plan Note (Signed)
Continue neurontin  For pain relief as effective

## 2013-09-12 NOTE — Assessment & Plan Note (Signed)
Uncontrolled Pt to commit to daily medication Hyperlipidemia:Low fat diet discussed and encouraged.

## 2013-09-13 LAB — BASIC METABOLIC PANEL
Calcium: 9.5 mg/dL (ref 8.4–10.5)
Creat: 0.89 mg/dL (ref 0.50–1.10)
Sodium: 141 mEq/L (ref 135–145)

## 2013-09-13 LAB — TSH: TSH: 3.227 u[IU]/mL (ref 0.350–4.500)

## 2013-09-14 LAB — VITAMIN D 25 HYDROXY (VIT D DEFICIENCY, FRACTURES): Vit D, 25-Hydroxy: 49 ng/mL (ref 30–89)

## 2013-09-15 ENCOUNTER — Telehealth: Payer: Self-pay | Admitting: Family Medicine

## 2013-09-15 ENCOUNTER — Other Ambulatory Visit: Payer: Self-pay

## 2013-09-15 MED ORDER — PRAVASTATIN SODIUM 80 MG PO TABS: 80.0000 mg | ORAL_TABLET | Freq: Every day | ORAL | Status: DC

## 2013-09-15 NOTE — Telephone Encounter (Signed)
Pls contact pt, all labs are excellent except her cholesterol which is too high. If she has not been taking pravachol 40mg  every day as is on her record, she needs to do this. If she has been taking this dose regularly, please increase the dose to 80mg  one at bedtime, send in and let pt and pharmacy know of dose increase, pls send in total of #30 per month with 5 refills if needed

## 2013-09-15 NOTE — Telephone Encounter (Signed)
Patient has been taking her pravastatin everyday so we increased the dose per Dr and sent to her pharmacy

## 2013-09-15 NOTE — Telephone Encounter (Signed)
Patient called back and said that she wasn't taking the pravastatin 40mg  everyday like she told me and she would start so I called back and cancelled the pravastatin 80mg  rx and gave verbal for refills of the pravastatin 40mg 

## 2013-09-20 ENCOUNTER — Ambulatory Visit (HOSPITAL_COMMUNITY)
Admission: RE | Admit: 2013-09-20 | Discharge: 2013-09-20 | Disposition: A | Discharge status: Home / Self Care | Payer: BC Managed Care – PPO | Source: Ambulatory Visit | Attending: Family Medicine | Admitting: Family Medicine

## 2013-09-20 DIAGNOSIS — Z1239 Encounter for other screening for malignant neoplasm of breast: Secondary | ICD-10-CM

## 2013-09-20 DIAGNOSIS — Z1231 Encounter for screening mammogram for malignant neoplasm of breast: Secondary | ICD-10-CM | POA: Insufficient documentation

## 2013-10-12 ENCOUNTER — Other Ambulatory Visit: Payer: Self-pay

## 2013-10-12 ENCOUNTER — Telehealth: Payer: Self-pay

## 2013-10-12 MED ORDER — BENZONATATE 100 MG PO CAPS: 100.0000 mg | ORAL_CAPSULE | Freq: Three times a day (TID) | ORAL | Status: DC | PRN

## 2013-10-12 MED ORDER — AZITHROMYCIN 250 MG PO TABS: ORAL_TABLET | ORAL | Status: AC

## 2013-10-12 NOTE — Telephone Encounter (Signed)
Patient aware and meds sent in  

## 2013-10-12 NOTE — Telephone Encounter (Signed)
pls send in and let her know tessalon perls 100mg  one  3 times daily as needed for 10 days and a z pack x 1

## 2013-11-10 ENCOUNTER — Other Ambulatory Visit: Payer: Self-pay | Admitting: Family Medicine

## 2013-12-06 ENCOUNTER — Other Ambulatory Visit: Payer: Self-pay | Admitting: Family Medicine

## 2013-12-27 ENCOUNTER — Other Ambulatory Visit: Payer: Self-pay

## 2013-12-27 MED ORDER — FISH OIL 1000 MG PO CAPS: ORAL_CAPSULE | ORAL | Status: DC

## 2014-02-05 ENCOUNTER — Other Ambulatory Visit: Payer: Self-pay | Admitting: Family Medicine

## 2014-02-07 ENCOUNTER — Encounter: Payer: BC Managed Care – PPO | Admitting: Family Medicine

## 2014-03-10 ENCOUNTER — Telehealth: Payer: Self-pay | Admitting: Family Medicine

## 2014-03-10 MED ORDER — BENZONATATE 100 MG PO CAPS: 100.0000 mg | ORAL_CAPSULE | Freq: Two times a day (BID) | ORAL | Status: DC | PRN

## 2014-03-10 MED ORDER — AZITHROMYCIN 250 MG PO TABS: ORAL_TABLET | ORAL | Status: DC

## 2014-03-10 NOTE — Telephone Encounter (Signed)
Pt came to office. C/o cough with yellow phlegm x 5 days, chest sore from all the coughing and some bodyaches. No openings the rest of the week. Please advise [phone]

## 2014-03-10 NOTE — Telephone Encounter (Signed)
Attempted to notify patient.  No ability to leave message voicemail is full.

## 2014-03-10 NOTE — Telephone Encounter (Signed)
Tessalon perles and z pack prescribed pls let her know

## 2014-03-11 NOTE — Telephone Encounter (Signed)
Patient aware.

## 2014-03-21 ENCOUNTER — Other Ambulatory Visit: Payer: Self-pay | Admitting: Family Medicine

## 2014-03-22 ENCOUNTER — Other Ambulatory Visit: Payer: Self-pay | Admitting: Family Medicine

## 2014-03-29 ENCOUNTER — Other Ambulatory Visit: Payer: Self-pay | Admitting: Family Medicine

## 2014-04-06 ENCOUNTER — Ambulatory Visit (INDEPENDENT_AMBULATORY_CARE_PROVIDER_SITE_OTHER): Payer: BC Managed Care – PPO | Admitting: Family Medicine

## 2014-04-06 ENCOUNTER — Encounter (INDEPENDENT_AMBULATORY_CARE_PROVIDER_SITE_OTHER): Payer: Self-pay

## 2014-04-06 ENCOUNTER — Encounter: Payer: Self-pay | Admitting: Family Medicine

## 2014-04-06 ENCOUNTER — Other Ambulatory Visit (HOSPITAL_COMMUNITY)
Admission: RE | Admit: 2014-04-06 | Discharge: 2014-04-06 | Disposition: A | Discharge status: Home / Self Care | Payer: BC Managed Care – PPO | Source: Ambulatory Visit | Attending: Family Medicine | Admitting: Family Medicine

## 2014-04-06 VITALS — BP 120/80 | HR 92 | Resp 18 | Wt 160.1 lb

## 2014-04-06 DIAGNOSIS — E785 Hyperlipidemia, unspecified: Secondary | ICD-10-CM

## 2014-04-06 DIAGNOSIS — M25551 Pain in right hip: Secondary | ICD-10-CM | POA: Insufficient documentation

## 2014-04-06 DIAGNOSIS — Z124 Encounter for screening for malignant neoplasm of cervix: Secondary | ICD-10-CM

## 2014-04-06 DIAGNOSIS — M549 Dorsalgia, unspecified: Secondary | ICD-10-CM

## 2014-04-06 DIAGNOSIS — Z01419 Encounter for gynecological examination (general) (routine) without abnormal findings: Secondary | ICD-10-CM | POA: Insufficient documentation

## 2014-04-06 DIAGNOSIS — R7301 Impaired fasting glucose: Secondary | ICD-10-CM

## 2014-04-06 DIAGNOSIS — Z0000 Encounter for general adult medical examination without abnormal findings: Secondary | ICD-10-CM

## 2014-04-06 DIAGNOSIS — Z1211 Encounter for screening for malignant neoplasm of colon: Secondary | ICD-10-CM

## 2014-04-06 DIAGNOSIS — Z1151 Encounter for screening for human papillomavirus (HPV): Secondary | ICD-10-CM | POA: Insufficient documentation

## 2014-04-06 DIAGNOSIS — M25559 Pain in unspecified hip: Secondary | ICD-10-CM

## 2014-04-06 LAB — POC HEMOCCULT BLD/STL (OFFICE/1-CARD/DIAGNOSTIC): FECAL OCCULT BLD: NEGATIVE

## 2014-04-06 MED ORDER — METHYLPREDNISOLONE ACETATE 80 MG/ML IJ SUSP
80.0000 mg | Freq: Once | INTRAMUSCULAR | Status: AC
  Administered 2014-04-06: 80 mg via INTRAMUSCULAR

## 2014-04-06 MED ORDER — LORAZEPAM 1 MG PO TABS: ORAL_TABLET | ORAL | Status: DC

## 2014-04-06 MED ORDER — METHOCARBAMOL 750 MG PO TABS: 750.0000 mg | ORAL_TABLET | Freq: Three times a day (TID) | ORAL | Status: DC | PRN

## 2014-04-06 MED ORDER — PREDNISONE (PAK) 5 MG PO TABS: 5.0000 mg | ORAL_TABLET | ORAL | Status: DC

## 2014-04-06 NOTE — Patient Instructions (Addendum)
F/u in 6 month, call if you need me before  Pls get xray of hip and fasting labs as soon as possible, this week preferred. Labs are CBC, fasting lipid and cmp and HBA1C  Medication, prednisone and robaxin are sent for acute back pain following fall, depo medrol in office today for fall  MRI of your back  is ordered, also ativan to be used before test to calm you, we will contact you with all results

## 2014-04-08 DIAGNOSIS — Z0000 Encounter for general adult medical examination without abnormal findings: Secondary | ICD-10-CM | POA: Insufficient documentation

## 2014-04-08 DIAGNOSIS — Z01810 Encounter for preprocedural cardiovascular examination: Secondary | ICD-10-CM | POA: Insufficient documentation

## 2014-04-08 NOTE — Progress Notes (Signed)
   Subjective:    Patient ID: [name], female    DOB: [birth year], 63 y.o.   MRN: [id number]  HPI Patient is in for annual physical exam. She unfortunately slipped on soap in her bathroom 1 day ago, now c/o severe rigth hip pain with direct pressure, also c/o increased and worsening chronic low back pain radiating down the right lower extremity, disturbs sleep often. 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    Review of Systems See HPI      Objective:   Physical Exam  BP 120/80  Pulse 92  Resp 18  Wt 160 lb 1.9 oz (72.63 kg)  SpO2 98% Pleasant well nourished female, alert and oriented x 3, in no cardio-pulmonary distress. Afebrile. HEENT No facial trauma or asymetry. Sinuses non tender.  EOMI, PERTL, fundoscopic exam  no hemorhage or exudate.  External ears normal, tympanic membranes clear. Oropharynx moist, no exudate, fairly  good dentition. Neck: supple, no adenopathy,JVD or thyromegaly.No bruits.  Chest: Clear to ascultation bilaterally.No crackles or wheezes. Non tender to palpation  Breast: No asymetry,no masses or lumps. No tenderness. No nipple discharge or inversion. No axillary or supraclavicular adenopathy  Cardiovascular system; Heart sounds normal,  S1 and  S2 ,no S3.  No murmur, or thrill. Apical beat not displaced Peripheral pulses normal.  Abdomen: Soft, non tender, no organomegaly or masses. No bruits. Bowel sounds normal. No guarding, tenderness or rebound.  Rectal:  Normal sphincter tone. No mass.No rectal masses.  Guaiac negative stool.  GU: External genitalia normal female genitalia , female distribution of hair. No lesions. Urethral meatus normal in size, mild  Prolapse, no lesions visibly  Present. Bladder non tender.mild bladder prolapse noted Vagina pink and moist , with  no visible lesions ,scant physiologic  discharge present . Poor pelvic support   Cystocele noted , no rectocele noted  Uterus absent, no adnexal masses, no  adnexal tenderness.   Musculoskeletal exam: Decreased ROM of lumbar spine, decreased ROM rigth hip , normal ROM in  shoulders and knees. No deformity ,swelling or crepitus noted. No muscle wasting or atrophy.   Neurologic: Cranial nerves 2 to 12 intact. Power, tone ,sensation and reflexes normal throughout.  disturbance in gait noted due to acute hip pain from recent trauma No tremor.  Skin: Intact, no ulceration, erythema , scaling or rash noted. Pigmentation normal throughout  Psych; Normal mood and affect. Judgement and concentration normal .       Assessment & Plan:  Annual physical exam Annual exam as documented. Counseling done  re healthy lifestyle involving commitment to 150 minutes exercise per week, heart healthy diet, and attaining healthy weight.The importance of adequate sleep also discussed. Regular seat belt use a is also discussed. . Immunization and cancer screening needs are specifically addressed at this visit.   Back pain with radiation Progressive and uncontrolled pain syndrome   Hip pain, right Acute pain and reduced mobility following fall in bathtub 1 day ago, needs xray of hip and short sharp anti inflammatory course

## 2014-04-08 NOTE — Assessment & Plan Note (Signed)
Annual exam as documented. Counseling done  re healthy lifestyle involving commitment to 150 minutes exercise per week, heart healthy diet, and attaining healthy weight.The importance of adequate sleep also discussed. Regular seat belt use a is also discussed. . Immunization and cancer screening needs are specifically addressed at this visit.

## 2014-04-08 NOTE — Assessment & Plan Note (Signed)
Acute pain and reduced mobility following fall in bathtub 1 day ago, needs xray of hip and short sharp anti inflammatory course

## 2014-04-08 NOTE — Assessment & Plan Note (Signed)
Progressive and uncontrolled pain syndrome

## 2014-04-11 LAB — CYTOLOGY - PAP

## 2014-04-12 ENCOUNTER — Telehealth: Payer: Self-pay | Admitting: Family Medicine

## 2014-04-12 LAB — CBC
HEMATOCRIT: 35.1 % — AB (ref 36.0–46.0)
Hemoglobin: 11.7 g/dL — ABNORMAL LOW (ref 12.0–15.0)
MCH: 29.7 pg (ref 26.0–34.0)
MCHC: 33.3 g/dL (ref 30.0–36.0)
MCV: 89.1 fL (ref 78.0–100.0)
Platelets: 334 10*3/uL (ref 150–400)
RBC: 3.94 MIL/uL (ref 3.87–5.11)
RDW: 14.5 % (ref 11.5–15.5)
WBC: 6.7 10*3/uL (ref 4.0–10.5)

## 2014-04-12 LAB — HEMOGLOBIN A1C
HEMOGLOBIN A1C: 6.2 % — AB (ref <5.7)
MEAN PLASMA GLUCOSE: 131 mg/dL — AB (ref <117)

## 2014-04-12 LAB — COMPREHENSIVE METABOLIC PANEL
ALT: 22 U/L (ref 0–35)
AST: 23 U/L (ref 0–37)
Albumin: 4.1 g/dL (ref 3.5–5.2)
Alkaline Phosphatase: 48 U/L (ref 39–117)
BUN: 10 mg/dL (ref 6–23)
CHLORIDE: 101 meq/L (ref 96–112)
CO2: 28 meq/L (ref 19–32)
CREATININE: 0.88 mg/dL (ref 0.50–1.10)
Calcium: 9.9 mg/dL (ref 8.4–10.5)
GLUCOSE: 95 mg/dL (ref 70–99)
Potassium: 4.2 mEq/L (ref 3.5–5.3)
Sodium: 139 mEq/L (ref 135–145)
Total Bilirubin: 0.5 mg/dL (ref 0.2–1.2)
Total Protein: 7.5 g/dL (ref 6.0–8.3)

## 2014-04-12 LAB — LIPID PANEL
CHOLESTEROL: 233 mg/dL — AB (ref 0–200)
HDL: 65 mg/dL (ref >39)
LDL Cholesterol: 138 mg/dL — ABNORMAL HIGH (ref 0–99)
Total CHOL/HDL Ratio: 3.6 Ratio
Triglycerides: 150 mg/dL — ABNORMAL HIGH (ref <150)
VLDL: 30 mg/dL (ref 0–40)

## 2014-04-12 NOTE — Telephone Encounter (Signed)
Had to leave message on appeal there is a numbero fax attentn pls call and see if you cn get it even if you press the option for direct peer review you will get it.  Pls send the MRI report of lumbar spine from 2003, and my cover note , thanks  ?? pls ask

## 2014-04-14 ENCOUNTER — Ambulatory Visit (HOSPITAL_COMMUNITY): Payer: BC Managed Care – PPO | Attending: Family Medicine

## 2014-04-15 ENCOUNTER — Other Ambulatory Visit: Payer: Self-pay

## 2014-04-15 MED ORDER — PRAVASTATIN SODIUM 80 MG PO TABS: 80.0000 mg | ORAL_TABLET | Freq: Every day | ORAL | Status: DC

## 2014-04-20 ENCOUNTER — Other Ambulatory Visit: Payer: Self-pay

## 2014-04-20 MED ORDER — PRAVASTATIN SODIUM 40 MG PO TABS: 40.0000 mg | ORAL_TABLET | Freq: Every day | ORAL | Status: DC

## 2014-07-28 ENCOUNTER — Other Ambulatory Visit: Payer: Self-pay | Admitting: Family Medicine

## 2014-08-16 ENCOUNTER — Other Ambulatory Visit: Payer: Self-pay | Admitting: Family Medicine

## 2014-08-26 ENCOUNTER — Other Ambulatory Visit: Payer: Self-pay | Admitting: Family Medicine

## 2014-09-11 ENCOUNTER — Emergency Department (HOSPITAL_COMMUNITY)
Admission: EM | Admit: 2014-09-11 | Discharge: 2014-09-11 | Disposition: A | Discharge status: Home / Self Care | Payer: BC Managed Care – PPO | Attending: Emergency Medicine | Admitting: Emergency Medicine

## 2014-09-11 ENCOUNTER — Encounter (HOSPITAL_COMMUNITY): Payer: Self-pay | Admitting: Cardiology

## 2014-09-11 DIAGNOSIS — Z8673 Personal history of transient ischemic attack (TIA), and cerebral infarction without residual deficits: Secondary | ICD-10-CM | POA: Diagnosis not present

## 2014-09-11 DIAGNOSIS — E785 Hyperlipidemia, unspecified: Secondary | ICD-10-CM | POA: Diagnosis not present

## 2014-09-11 DIAGNOSIS — K047 Periapical abscess without sinus: Secondary | ICD-10-CM | POA: Insufficient documentation

## 2014-09-11 DIAGNOSIS — Z79899 Other long term (current) drug therapy: Secondary | ICD-10-CM | POA: Diagnosis not present

## 2014-09-11 DIAGNOSIS — R220 Localized swelling, mass and lump, head: Secondary | ICD-10-CM | POA: Diagnosis present

## 2014-09-11 DIAGNOSIS — M1990 Unspecified osteoarthritis, unspecified site: Secondary | ICD-10-CM | POA: Insufficient documentation

## 2014-09-11 DIAGNOSIS — F419 Anxiety disorder, unspecified: Secondary | ICD-10-CM | POA: Diagnosis not present

## 2014-09-11 DIAGNOSIS — Z7982 Long term (current) use of aspirin: Secondary | ICD-10-CM | POA: Diagnosis not present

## 2014-09-11 MED ORDER — PENICILLIN V POTASSIUM 500 MG PO TABS: 500.0000 mg | ORAL_TABLET | Freq: Four times a day (QID) | ORAL | Status: AC

## 2014-09-11 MED ORDER — PENICILLIN V POTASSIUM 250 MG PO TABS
500.0000 mg | ORAL_TABLET | Freq: Once | ORAL | Status: AC
  Administered 2014-09-11: 500 mg via ORAL
  Filled 2014-09-11: qty 2

## 2014-09-11 MED ORDER — TRAMADOL HCL 50 MG PO TABS
50.0000 mg | ORAL_TABLET | Freq: Once | ORAL | Status: AC
  Administered 2014-09-11: 50 mg via ORAL
  Filled 2014-09-11: qty 1

## 2014-09-11 MED ORDER — TRAMADOL HCL 50 MG PO TABS
50.0000 mg | ORAL_TABLET | Freq: Four times a day (QID) | ORAL | Status: DC | PRN
Start: 2014-09-11 — End: 2015-02-06

## 2014-09-11 NOTE — ED Notes (Signed)
Swelling left cheek and left eye.  C/o headache and head congestion.   Symptoms 2-3 days,.

## 2014-09-11 NOTE — ED Provider Notes (Signed)
CSN: [id number]     Arrival date & time 09/11/14  7169 History  This chart was scribed for Ann Diego, MD by Tula Nakayama, ED Scribe. This patient was seen in room APA12/APA12 and the patient's care was started at 7:19 AM.   Chief Complaint  Patient presents with  . Facial Swelling   Patient is a 63 y.o. female presenting with tooth pain. The history is provided by the patient. No language interpreter was used.  Dental Pain Location:  Upper Upper teeth location: pain right upper molar. Severity:  Moderate Onset quality:  Sudden Timing:  Constant Associated symptoms: facial swelling   Associated symptoms: no congestion and no headaches     HPI Comments: [name] is a 63 y.o. female who presents to the Emergency Department complaining of facial swelling to left cheek and left eye that started 2-3 days ago. Pt took Claritin with no relief to symptoms. She has 1 episode history of prior symptoms and stated it went away with time. She notes that she has no seen a dentist nor PCP for symptoms. Pt denies mouth pain as an associated symptom.  Past Medical History  Diagnosis Date  . Anxiety   . Hyperlipidemia   . GERD (gastroesophageal reflux disease)   . Allergy   . Osteoarthritis   . TIA (transient ischemic attack) 2012, hospitalised   Past Surgical History  Procedure Laterality Date  . Partial hysterectomy    . Cyst removed from right breast    . Tubal ligation     Family History  Problem Relation Age of Onset  . Heart disease Mother   . Heart disease Father   . Diabetes Father   . Hypertension Sister   . Hypertension Brother   . Heart disease Maternal Grandmother   . Cancer Paternal Grandmother     breast  . Diabetes Paternal Grandfather   . Hypertension Sister   . Hypertension Sister    History  Substance Use Topics  . Smoking status: Never Smoker   . Smokeless tobacco: Not on file  . Alcohol Use: Yes     Comment: occasionally wine   OB History    No  data available     Review of Systems  Constitutional: Negative for appetite change and fatigue.  HENT: Positive for dental problem and facial swelling. Negative for congestion, ear discharge and sinus pressure.   Eyes: Negative for discharge.  Respiratory: Negative for cough.   Cardiovascular: Negative for chest pain.  Gastrointestinal: Negative for abdominal pain and diarrhea.  Genitourinary: Negative for frequency and hematuria.  Musculoskeletal: Negative for back pain.  Skin: Negative for rash.  Neurological: Negative for seizures and headaches.  Psychiatric/Behavioral: Negative for hallucinations.   Allergies  Ibuprofen  Home Medications   Prior to Admission medications   Medication Sig Start Date End Date Taking? Authorizing Provider  aspirin 81 MG tablet Take 81 mg by mouth daily.    Historical Provider, MD  benzonatate (TESSALON) 100 MG capsule TAKE ONE CAPSULE BY MOUTH TWO TIMES DAILY AS NEEDED FOR COUGH. 08/16/14   Fayrene Helper, MD  citalopram (CELEXA) 20 MG tablet TAKE ONE TABLET BY MOUTH ONCE DAILY. 08/29/14   Fayrene Helper, MD  gabapentin (NEURONTIN) 300 MG capsule TAKE (1) CAPSULE BY MOUTH TWICE DAILY. 07/29/14   Fayrene Helper, MD  loratadine (CLARITIN) 10 MG tablet TAKE (1) TABLET BY MOUTH ONCE DAILY AS NEEDED FOR ALLERGIES. 03/22/14   Fayrene Helper, MD  LORazepam (ATIVAN)  1 MG tablet One tablet 30 minutes before test, may repeat once after 15 minutes 04/06/14   Fayrene Helper, MD  methocarbamol (ROBAXIN) 750 MG tablet Take 1 tablet (750 mg total) by mouth every 8 (eight) hours as needed for muscle spasms. 04/06/14   Fayrene Helper, MD  Multiple Vitamins-Minerals (MULTIVITAMIN PO) Take by mouth.    Historical Provider, MD  Omega-3 Fatty Acids (FISH OIL) 1000 MG CAPS TAKE 2 CAPSULES (2 G TOTAL) BY MOUTH DAILY. 12/27/13   Fayrene Helper, MD  oxybutynin (DITROPAN) 5 MG tablet TAKE (1) TABLET BY MOUTH TWICE DAILY. 08/09/13   Fayrene Helper, MD   pravastatin (PRAVACHOL) 40 MG tablet Take 1 tablet (40 mg total) by mouth daily. 04/20/14   Fayrene Helper, MD  predniSONE (STERAPRED UNI-PAK) 5 MG TABS tablet Take 1 tablet (5 mg total) by mouth as directed. 04/06/14   Fayrene Helper, MD   BP 149/104 mmHg  Pulse 96  Temp(Src) 98.5 F (36.9 C) (Oral)  Resp 18  Ht 5\' 3"  (1.6 m)  Wt 150 lb (68.04 kg)  BMI 26.58 kg/m2  SpO2 95% Physical Exam  Constitutional: She is oriented to person, place, and time. She appears well-developed.  HENT:  Head: Normocephalic.  Poor dentition Tenderness to left upper molar Swelling to left cheek  Eyes: Conjunctivae and EOM are normal. No scleral icterus.  Neck: Neck supple. No thyromegaly present.  Cardiovascular: Normal rate and regular rhythm.  Exam reveals no gallop and no friction rub.   No murmur heard. Pulmonary/Chest: No stridor. She has no wheezes. She has no rales. She exhibits no tenderness.  Abdominal: She exhibits no distension. There is no tenderness. There is no rebound.  Musculoskeletal: Normal range of motion. She exhibits no edema.  Lymphadenopathy:    She has no cervical adenopathy.  Neurological: She is oriented to person, place, and time. She exhibits normal muscle tone. Coordination normal.  Skin: No rash noted. No erythema.  Psychiatric: She has a normal mood and affect. Her behavior is normal.  Nursing note and vitals reviewed.   ED Course  Procedures (including critical care time)  DIAGNOSTIC STUDIES: Oxygen Saturation is 95% on RA, adequate by my interpretation.    COORDINATION OF CARE: 7:23 AM Discussed treatment plan with pt which includes antibiotics and pt agreed to plan. Advised pt to follow up with dentist this week.   Labs Review Labs Reviewed - No data to display  Imaging Review No results found.   EKG Interpretation None      MDM   Final diagnoses:  None   The chart was scribed for me under my direct supervision.  I personally performed  the history, physical, and medical decision making and all procedures in the evaluation of this patient.Ann Diego, MD 09/11/14 [phone]

## 2014-09-11 NOTE — Discharge Instructions (Signed)
Follow up with a dentist this week.

## 2014-09-13 ENCOUNTER — Other Ambulatory Visit: Payer: Self-pay | Admitting: Family Medicine

## 2014-11-29 ENCOUNTER — Other Ambulatory Visit: Payer: Self-pay | Admitting: Family Medicine

## 2014-12-26 ENCOUNTER — Telehealth: Payer: Self-pay | Admitting: Family Medicine

## 2014-12-26 MED ORDER — LORATADINE 10 MG PO TABS: ORAL_TABLET | ORAL | Status: DC

## 2014-12-26 MED ORDER — CITALOPRAM HYDROBROMIDE 20 MG PO TABS: 20.0000 mg | ORAL_TABLET | Freq: Every day | ORAL | Status: DC

## 2014-12-26 NOTE — Telephone Encounter (Signed)
Enough refills sent to last until visit

## 2015-02-06 ENCOUNTER — Encounter: Payer: Self-pay | Admitting: Family Medicine

## 2015-02-06 ENCOUNTER — Ambulatory Visit (INDEPENDENT_AMBULATORY_CARE_PROVIDER_SITE_OTHER): Payer: Self-pay | Admitting: Family Medicine

## 2015-02-06 VITALS — BP 130/82 | HR 81 | Resp 16 | Ht 63.0 in | Wt 167.1 lb

## 2015-02-06 DIAGNOSIS — N318 Other neuromuscular dysfunction of bladder: Secondary | ICD-10-CM

## 2015-02-06 DIAGNOSIS — F411 Generalized anxiety disorder: Secondary | ICD-10-CM

## 2015-02-06 DIAGNOSIS — R7303 Prediabetes: Secondary | ICD-10-CM

## 2015-02-06 DIAGNOSIS — E785 Hyperlipidemia, unspecified: Secondary | ICD-10-CM

## 2015-02-06 DIAGNOSIS — M549 Dorsalgia, unspecified: Secondary | ICD-10-CM

## 2015-02-06 DIAGNOSIS — R7309 Other abnormal glucose: Secondary | ICD-10-CM

## 2015-02-06 DIAGNOSIS — J302 Other seasonal allergic rhinitis: Secondary | ICD-10-CM

## 2015-02-06 MED ORDER — CITALOPRAM HYDROBROMIDE 20 MG PO TABS: 20.0000 mg | ORAL_TABLET | Freq: Every day | ORAL | Status: DC

## 2015-02-06 MED ORDER — GABAPENTIN 300 MG PO CAPS: ORAL_CAPSULE | ORAL | Status: DC

## 2015-02-06 MED ORDER — LORATADINE 10 MG PO TABS: ORAL_TABLET | ORAL | Status: DC

## 2015-02-06 MED ORDER — PRAVASTATIN SODIUM 40 MG PO TABS: ORAL_TABLET | ORAL | Status: DC

## 2015-02-06 NOTE — Patient Instructions (Addendum)
F/u in mid September  You are referred for diabetic ed  Pls get fasting lipid, cmp and hBa1C asap  Remember to change the drink  Habits

## 2015-02-24 ENCOUNTER — Other Ambulatory Visit: Payer: Self-pay

## 2015-02-24 MED ORDER — OXYBUTYNIN CHLORIDE 5 MG PO TABS: ORAL_TABLET | ORAL | Status: DC

## 2015-02-24 MED ORDER — PRAVASTATIN SODIUM 40 MG PO TABS: ORAL_TABLET | ORAL | Status: DC

## 2015-03-30 DIAGNOSIS — R7303 Prediabetes: Secondary | ICD-10-CM | POA: Insufficient documentation

## 2015-03-30 NOTE — Assessment & Plan Note (Signed)
Updated lab needed at/ before next visit. Pt has no insurance currently , which is a problem Hyperlipidemia:Low fat diet discussed and encouraged.uncontrolled when last checked   Lipid Panel  Lab Results  Component Value Date   CHOL 233* 04/12/2014   HDL 65 04/12/2014   LDLCALC 138* 04/12/2014   TRIG 150* 04/12/2014   CHOLHDL 3.6 04/12/2014

## 2015-03-30 NOTE — Assessment & Plan Note (Signed)
Controlled on gabapentin , continue same

## 2015-03-30 NOTE — Assessment & Plan Note (Signed)
More symptomatic in the Spring, medication refilled, and encouraged to use saline nasal flushes and sudafed as needed

## 2015-03-30 NOTE — Progress Notes (Signed)
   Subjective:    Patient ID: [name], female    DOB: Jan 01, 1951, 64 y.o.   MRN: [id number]  HPI    [name]     MRN: [id number]      DOB: Aug 12, 1951   HPI [name] is here for follow up and re-evaluation of chronic medical conditions, medication management and review of any available recent lab and radiology data.  Preventive health is updated, specifically  Cancer screening and Immunization.  Needs ins coverage for care procedures which the PT has had in the interim are  addressed. The PT denies any adverse reactions to current medications since the last visit.  There are no new concerns.  There are no specific complaints   ROS Denies recent fever or chills. Increased  sinus pressure, nasal congestion, denies  ear pain or sore throat. Denies chest congestion, productive cough or wheezing. Denies chest pains, palpitations and leg swelling Denies abdominal pain, nausea, vomiting,diarrhea or constipation.   Denies dysuria, frequency, hesitancy  Has incontinence and needs medication refilled Denies uncontrolled joint pain, swelling and limitation in mobility. Denies headaches, seizures, numbness, or tingling. Denies uncontrolled depression, anxiety or insomnia. Denies skin break down or rash.   PE  BP 130/82 mmHg  Pulse 81  Resp 16  Ht 5\' 3"  (1.6 m)  Wt 167 lb 1.9 oz (75.805 kg)  BMI 29.61 kg/m2  SpO2 99%  Patient alert and oriented and in no cardiopulmonary distress.  HEENT: No facial asymmetry, EOMI,   oropharynx pink and moist.  Neck supple no JVD, no mass.Mild erythema and edema of nasal mucosa and conjunctival injection  Chest: Clear to auscultation bilaterally.  CVS: S1, S2 no murmurs, no S3.Regular rate.  ABD: Soft non tender.   Ext: No edema  MS: Adequate ROM spine, shoulders, hips and knees.  Skin: Intact, no ulcerations or rash noted.  Psych: Good eye contact, normal affect. Memory intact not anxious or depressed appearing.  CNS: CN 2-12 intact,  power,  normal throughout.no focal deficits noted.   Assessment & Plan   DEPRESSION Controlled, no change in medication    Back pain with radiation Controlled on gabapentin , continue same   GAD (generalized anxiety disorder) Controlled, no change in medication    Hyperlipemia Updated lab needed at/ before next visit. Pt has no insurance currently , which is a problem Hyperlipidemia:Low fat diet discussed and encouraged.uncontrolled when last checked   Lipid Panel  Lab Results  Component Value Date   CHOL 233* 04/12/2014   HDL 65 04/12/2014   LDLCALC 138* 04/12/2014   TRIG 150* 04/12/2014   CHOLHDL 3.6 04/12/2014         OVERACTIVE BLADDER Controlled with medication, continue same   Allergic rhinitis More symptomatic in the Spring, medication refilled, and encouraged to use saline nasal flushes and sudafed as needed       Review of Systems     Objective:   Physical Exam        Assessment & Plan:

## 2015-03-30 NOTE — Assessment & Plan Note (Signed)
Controlled, no change in medication  

## 2015-03-30 NOTE — Assessment & Plan Note (Signed)
Controlled with medication, continue same

## 2015-03-30 NOTE — Assessment & Plan Note (Signed)
Updated lab needed at/ before next visit. Patient educated about the importance of limiting  Carbohydrate intake , the need to commit to daily physical activity for a minimum of 30 minutes , and to commit weight loss. The fact that changes in all these areas will reduce or eliminate all together the development of diabetes is stressed.   Diabetic Labs Latest Ref Rng 04/12/2014 09/11/2013 12/31/2012 07/15/2012 06/08/2012  HbA1c <5.7 % 6.2(H) - - - -  Chol 0 - 200 mg/dL 233(H) 258(H) 243(H) 318(H) -  HDL >39 mg/dL 65 58 65 59 -  Calc LDL 0 - 99 mg/dL 138(H) 170(H) 147(H) 228(H) -  Triglycerides <150 mg/dL 150(H) 149 154(H) 155(H) -  Creatinine 0.50 - 1.10 mg/dL 0.88 0.89 - - 0.90   BP/Weight 02/06/2015 09/11/2014 04/06/2014 09/08/2013 02/22/2013 0/67/7034 0/12/5246  Systolic BP 185 909 311 216 244 695 072  Diastolic BP 82 257 80 80 82 74 80  Wt. (Lbs) 167.12 150 160.12 160 157 158.04 162.08  BMI 29.61 26.58 28.37 28.35 27.82 28 28.72   No flowsheet data found.

## 2015-06-13 ENCOUNTER — Telehealth: Payer: Self-pay | Admitting: *Deleted

## 2015-06-13 NOTE — Telephone Encounter (Signed)
Pt's daughter called [facility] during lunch requesting to speak with a nurse

## 2015-06-13 NOTE — Telephone Encounter (Signed)
Called number left several times and kept getting error message that I called a restricted number and that call could not be placed. Then I tried the cell which went to voicemail stating the mailbox was full. Will await patients call back and tell receptionist to get me to the phone

## 2015-07-11 ENCOUNTER — Ambulatory Visit: Payer: Self-pay | Admitting: Family Medicine

## 2015-08-12 ENCOUNTER — Other Ambulatory Visit: Payer: Self-pay | Admitting: Family Medicine

## 2015-08-23 ENCOUNTER — Telehealth: Payer: Self-pay | Admitting: *Deleted

## 2015-08-23 MED ORDER — LORATADINE 10 MG PO TABS: ORAL_TABLET | ORAL | Status: DC

## 2015-08-23 NOTE — Telephone Encounter (Signed)
Med refilled.

## 2015-08-23 NOTE — Telephone Encounter (Signed)
Patient called requesting loratadine to be refilled to [place] [facility].

## 2015-09-25 ENCOUNTER — Other Ambulatory Visit: Payer: Self-pay | Admitting: Family Medicine

## 2015-10-16 ENCOUNTER — Other Ambulatory Visit: Payer: Self-pay | Admitting: Family Medicine

## 2015-10-26 ENCOUNTER — Other Ambulatory Visit: Payer: Self-pay | Admitting: Family Medicine

## 2015-11-03 ENCOUNTER — Ambulatory Visit: Payer: Self-pay | Admitting: Family Medicine

## 2015-11-13 ENCOUNTER — Ambulatory Visit: Payer: Self-pay | Admitting: Family Medicine

## 2015-12-09 ENCOUNTER — Other Ambulatory Visit: Payer: Self-pay | Admitting: Family Medicine

## 2015-12-21 ENCOUNTER — Other Ambulatory Visit: Payer: Self-pay | Admitting: Family Medicine

## 2015-12-22 ENCOUNTER — Telehealth: Payer: Self-pay

## 2015-12-22 ENCOUNTER — Other Ambulatory Visit: Payer: Self-pay

## 2015-12-22 DIAGNOSIS — R7303 Prediabetes: Secondary | ICD-10-CM

## 2015-12-22 DIAGNOSIS — E785 Hyperlipidemia, unspecified: Secondary | ICD-10-CM

## 2015-12-22 MED ORDER — OXYBUTYNIN CHLORIDE 5 MG PO TABS: ORAL_TABLET | ORAL | Status: DC

## 2015-12-22 MED ORDER — PRAVASTATIN SODIUM 40 MG PO TABS: ORAL_TABLET | ORAL | Status: DC

## 2015-12-22 MED ORDER — CITALOPRAM HYDROBROMIDE 20 MG PO TABS: 20.0000 mg | ORAL_TABLET | Freq: Every day | ORAL | Status: DC

## 2015-12-22 NOTE — Telephone Encounter (Signed)
Labs ordered and mailed to home address.  

## 2015-12-27 ENCOUNTER — Telehealth: Payer: Self-pay | Admitting: Family Medicine

## 2015-12-27 ENCOUNTER — Ambulatory Visit: Payer: Self-pay | Admitting: Family Medicine

## 2015-12-27 NOTE — Telephone Encounter (Signed)
She wants a nurse to give her a call - she wouldn't tell me why

## 2015-12-28 NOTE — Telephone Encounter (Signed)
[name] spoke with patient

## 2016-01-18 ENCOUNTER — Telehealth: Payer: Self-pay

## 2016-01-18 MED ORDER — SILVER SULFADIAZINE 1 % EX CREA: TOPICAL_CREAM | CUTANEOUS | Status: DC

## 2016-01-18 NOTE — Telephone Encounter (Signed)
Sent pls let her know

## 2016-01-18 NOTE — Telephone Encounter (Signed)
Mailbox is full and cannot accept messages but told her to check with the pharmacy later

## 2016-01-18 NOTE — Telephone Encounter (Signed)
States Tuesday she spilled some very hot coffee on her side and the area is burning and red, no blisters but its a big area and she wants to know if she can have some burn cream called in for it to stop the stinging. Uses [facility]. No drainage. Please advise

## 2016-01-26 LAB — LIPID PANEL
CHOL/HDL RATIO: 3.6 ratio (ref <=5.0)
CHOLESTEROL: 253 mg/dL — AB (ref 125–200)
HDL: 70 mg/dL (ref >=46)
LDL Cholesterol: 164 mg/dL — ABNORMAL HIGH (ref <130)
TRIGLYCERIDES: 97 mg/dL (ref <150)
VLDL: 19 mg/dL (ref <30)

## 2016-01-26 LAB — COMPLETE METABOLIC PANEL WITH GFR
ALBUMIN: 4.2 g/dL (ref 3.6–5.1)
ALK PHOS: 43 U/L (ref 33–130)
ALT: 22 U/L (ref 6–29)
AST: 29 U/L (ref 10–35)
BILIRUBIN TOTAL: 0.5 mg/dL (ref 0.2–1.2)
BUN: 11 mg/dL (ref 7–25)
CALCIUM: 9.4 mg/dL (ref 8.6–10.4)
CO2: 28 mmol/L (ref 20–31)
CREATININE: 0.89 mg/dL (ref 0.50–0.99)
Chloride: 103 mmol/L (ref 98–110)
GFR, EST AFRICAN AMERICAN: 79 mL/min (ref >=60)
GFR, EST NON AFRICAN AMERICAN: 69 mL/min (ref >=60)
Glucose, Bld: 110 mg/dL — ABNORMAL HIGH (ref 65–99)
Potassium: 4.1 mmol/L (ref 3.5–5.3)
Sodium: 139 mmol/L (ref 135–146)
TOTAL PROTEIN: 7.3 g/dL (ref 6.1–8.1)

## 2016-01-26 LAB — HEMOGLOBIN A1C
Hgb A1c MFr Bld: 5.9 % — ABNORMAL HIGH (ref <5.7)
MEAN PLASMA GLUCOSE: 123 mg/dL

## 2016-02-01 ENCOUNTER — Encounter: Payer: Self-pay | Admitting: Family Medicine

## 2016-02-01 ENCOUNTER — Ambulatory Visit (INDEPENDENT_AMBULATORY_CARE_PROVIDER_SITE_OTHER): Payer: Self-pay | Admitting: Family Medicine

## 2016-02-01 VITALS — BP 140/88 | HR 89 | Resp 16 | Ht 63.0 in | Wt 158.0 lb

## 2016-02-01 DIAGNOSIS — N318 Other neuromuscular dysfunction of bladder: Secondary | ICD-10-CM

## 2016-02-01 DIAGNOSIS — R7303 Prediabetes: Secondary | ICD-10-CM

## 2016-02-01 DIAGNOSIS — F4323 Adjustment disorder with mixed anxiety and depressed mood: Secondary | ICD-10-CM

## 2016-02-01 DIAGNOSIS — Z1231 Encounter for screening mammogram for malignant neoplasm of breast: Secondary | ICD-10-CM

## 2016-02-01 DIAGNOSIS — M549 Dorsalgia, unspecified: Secondary | ICD-10-CM

## 2016-02-01 DIAGNOSIS — J3089 Other allergic rhinitis: Secondary | ICD-10-CM

## 2016-02-01 DIAGNOSIS — E785 Hyperlipidemia, unspecified: Secondary | ICD-10-CM

## 2016-02-01 MED ORDER — TEMAZEPAM 7.5 MG PO CAPS: 7.5000 mg | ORAL_CAPSULE | Freq: Every evening | ORAL | Status: DC | PRN

## 2016-02-01 MED ORDER — PRAVASTATIN SODIUM 80 MG PO TABS: 80.0000 mg | ORAL_TABLET | Freq: Every day | ORAL | Status: DC

## 2016-02-01 MED ORDER — CITALOPRAM HYDROBROMIDE 40 MG PO TABS: 40.0000 mg | ORAL_TABLET | Freq: Every day | ORAL | Status: DC

## 2016-02-01 NOTE — Patient Instructions (Signed)
F/u in 4 month, call if you need me sooner  CONGRATS , improved blood sugar!!  Cholesterol still high, pls cut back on buttetred popcorn and nuts  NEW higher dose of pravachol 80 mg one daily (OK to take two 40 mg tabs till done)  New higher dose of citalopram 40 mg for depression (OK to take two 20 mg tabs daily till donme)   New is restoril to help with sleep  Fasting lipid, and hepatic in 4 month  PLS START arranging for free mammogram in the Fall, you need this  Thank you  for choosing [facility]. We consider it a privelige to serve you.  Delivering excellent health care in a caring and  compassionate way is our goal.  Partnering with you,  so that together we can achieve this goal is our strategy.

## 2016-02-01 NOTE — Progress Notes (Signed)
Subjective:    Patient ID: [name], female    DOB: Jan 20, 1951, 65 y.o.   MRN: [id number]  HPI   [name]     MRN: [id number]      DOB: 1951-03-11   HPI [name] is here for follow up and re-evaluation of chronic medical conditions, medication management and review of any available recent lab and radiology data.  Preventive health is updated, specifically  Cancer screening and Immunization.  No ins , and mammogram past due , I have advised her to start application for free mammogram available in the Fall Good response reported to silvadene which she recently used on a burn to her left thigh where she had spilled cofee.  C/o increased stress, anxiety and insomnia   ROS Denies recent fever or chills. Denies sinus pressure, nasal congestion, ear pain or sore throat. Denies chest congestion, productive cough or wheezing. Denies chest pains, palpitations and leg swelling Denies abdominal pain, nausea, vomiting,diarrhea or constipation.   Denies dysuria, frequency, hesitancy or incontinence. Denies uncontrolled joint pain, swelling and limitation in mobility. Increased Denies depression, anxiety and  Insomnia due to personal stress, not suicidal or homicidal Denies skin break down or rash.   PE  BP 140/88 mmHg  Pulse 89  Resp 16  Ht 5\' 3"  (1.6 m)  Wt 158 lb (71.668 kg)  BMI 28.00 kg/m2  SpO2 99%  Patient alert and oriented and in no cardiopulmonary distress.  HEENT: No facial asymmetry, EOMI,   oropharynx pink and moist.  Neck supple no JVD, no mass.  Chest: Clear to auscultation bilaterally.  CVS: S1, S2 no murmurs, no S3.Regular rate.  ABD: Soft non tender.   Ext: No edema  MS: Adequate ROM spine, shoulders, hips and knees.  Skin: Intact, no ulcerations or rash noted.  Psych: Good eye contact, normal affect. Memory intact not anxious or depressed appearing.  CNS: CN 2-12 intact, power,  normal throughout.no focal deficits noted.   Assessment &  Plan   Hyperlipemia Uncontrolled, increase med dose Hyperlipidemia:Low fat diet discussed and encouraged.   Lipid Panel  Lab Results  Component Value Date   CHOL 253* 01/26/2016   HDL 70 01/26/2016   LDLCALC 164* 01/26/2016   TRIG 97 01/26/2016   CHOLHDL 3.6 01/26/2016        Adjustment reaction with anxiety and depression Not suicidal or homicidal, stressed due to family issues Ventilated for approx 10 mins. Increase citalopram to 40 mg dose  Prediabetes Improved , she is applauded on this Patient educated about the importance of limiting  Carbohydrate intake , the need to commit to daily physical activity for a minimum of 30 minutes , and to commit weight loss. The fact that changes in all these areas will reduce or eliminate all together the development of diabetes is stressed.   Diabetic Labs Latest Ref Rng 01/26/2016 04/12/2014 09/11/2013 12/31/2012 07/15/2012  HbA1c <5.7 % 5.9(H) 6.2(H) - - -  Chol 125 - 200 mg/dL 253(H) 233(H) 258(H) 243(H) 318(H)  HDL >=46 mg/dL 70 65 58 65 59  Calc LDL <130 mg/dL 164(H) 138(H) 170(H) 147(H) 228(H)  Triglycerides <150 mg/dL 97 150(H) 149 154(H) 155(H)  Creatinine 0.50 - 0.99 mg/dL 0.89 0.88 0.89 - -   BP/Weight 02/01/2016 02/06/2015 09/11/2014 04/06/2014 09/08/2013 02/22/2013 [id number]  Systolic BP XX123456 [id number] 123456 123456 0000000 [id number]  Diastolic BP 88 82 123456 80 80 82 74  Wt. (Lbs) 158 167.12 150 160.12 160 157 158.04  BMI 28  29.61 26.58 28.37 28.35 27.82 28   No flowsheet data found.     OVERACTIVE BLADDER Controlled, no change in medication   Allergic rhinitis Controlled, no change in medication   Back pain with radiation Controlled, no change in medication        Review of Systems     Objective:   Physical Exam        Assessment & Plan:

## 2016-02-01 NOTE — Assessment & Plan Note (Addendum)
Uncontrolled, increase med dose Hyperlipidemia:Low fat diet discussed and encouraged.   Lipid Panel  Lab Results  Component Value Date   CHOL 253* 01/26/2016   HDL 70 01/26/2016   LDLCALC 164* 01/26/2016   TRIG 97 01/26/2016   CHOLHDL 3.6 01/26/2016

## 2016-02-04 NOTE — Assessment & Plan Note (Signed)
Controlled, no change in medication  

## 2016-02-04 NOTE — Assessment & Plan Note (Signed)
Improved , she is applauded on this Patient educated about the importance of limiting  Carbohydrate intake , the need to commit to daily physical activity for a minimum of 30 minutes , and to commit weight loss. The fact that changes in all these areas will reduce or eliminate all together the development of diabetes is stressed.   Diabetic Labs Latest Ref Rng 01/26/2016 04/12/2014 09/11/2013 12/31/2012 07/15/2012  HbA1c <5.7 % 5.9(H) 6.2(H) - - -  Chol 125 - 200 mg/dL 253(H) 233(H) 258(H) 243(H) 318(H)  HDL >=46 mg/dL 70 65 58 65 59  Calc LDL <130 mg/dL 164(H) 138(H) 170(H) 147(H) 228(H)  Triglycerides <150 mg/dL 97 150(H) 149 154(H) 155(H)  Creatinine 0.50 - 0.99 mg/dL 0.89 0.88 0.89 - -   BP/Weight 02/01/2016 02/06/2015 09/11/2014 04/06/2014 09/08/2013 02/22/2013 [id number]  Systolic BP XX123456 [id number] 123456 123456 0000000 [id number]  Diastolic BP 88 82 123456 80 80 82 74  Wt. (Lbs) 158 167.12 150 160.12 160 157 158.04  BMI 28 29.61 26.58 28.37 28.35 27.82 28   No flowsheet data found.

## 2016-02-04 NOTE — Assessment & Plan Note (Signed)
Not suicidal or homicidal, stressed due to family issues Ventilated for approx 10 mins. Increase citalopram to 40 mg dose

## 2016-02-05 ENCOUNTER — Other Ambulatory Visit: Payer: Self-pay

## 2016-02-05 MED ORDER — TEMAZEPAM 15 MG PO CAPS: 15.0000 mg | ORAL_CAPSULE | Freq: Every evening | ORAL | Status: DC | PRN

## 2016-06-04 ENCOUNTER — Ambulatory Visit: Payer: Self-pay | Admitting: Family Medicine

## 2016-06-21 ENCOUNTER — Other Ambulatory Visit: Payer: Self-pay | Admitting: Family Medicine

## 2016-06-30 ENCOUNTER — Other Ambulatory Visit: Payer: Self-pay | Admitting: Family Medicine

## 2016-08-04 ENCOUNTER — Other Ambulatory Visit: Payer: Self-pay | Admitting: Family Medicine

## 2016-08-04 DIAGNOSIS — F4323 Adjustment disorder with mixed anxiety and depressed mood: Secondary | ICD-10-CM

## 2016-08-05 ENCOUNTER — Other Ambulatory Visit: Payer: Self-pay

## 2016-08-05 DIAGNOSIS — F4323 Adjustment disorder with mixed anxiety and depressed mood: Secondary | ICD-10-CM

## 2016-08-05 MED ORDER — LORATADINE 10 MG PO TABS: ORAL_TABLET | ORAL | 6 refills | Status: DC

## 2016-08-05 MED ORDER — CITALOPRAM HYDROBROMIDE 40 MG PO TABS: 40.0000 mg | ORAL_TABLET | Freq: Every day | ORAL | 1 refills | Status: DC

## 2016-08-09 ENCOUNTER — Other Ambulatory Visit: Payer: Self-pay | Admitting: Family Medicine

## 2016-09-04 ENCOUNTER — Ambulatory Visit: Payer: Self-pay | Admitting: Family Medicine

## 2016-10-03 ENCOUNTER — Other Ambulatory Visit: Payer: Self-pay | Admitting: Family Medicine

## 2016-10-10 ENCOUNTER — Ambulatory Visit: Payer: Self-pay | Admitting: Family Medicine

## 2016-10-10 ENCOUNTER — Other Ambulatory Visit: Payer: Self-pay | Admitting: Family Medicine

## 2016-10-10 DIAGNOSIS — F4323 Adjustment disorder with mixed anxiety and depressed mood: Secondary | ICD-10-CM

## 2016-10-14 ENCOUNTER — Encounter: Payer: Self-pay | Admitting: Family Medicine

## 2016-10-14 ENCOUNTER — Ambulatory Visit (INDEPENDENT_AMBULATORY_CARE_PROVIDER_SITE_OTHER): Payer: Self-pay | Admitting: Family Medicine

## 2016-10-14 VITALS — BP 120/80 | HR 88 | Temp 98.3°F | Resp 18 | Ht 63.0 in | Wt 162.1 lb

## 2016-10-14 DIAGNOSIS — N318 Other neuromuscular dysfunction of bladder: Secondary | ICD-10-CM

## 2016-10-14 DIAGNOSIS — Z1239 Encounter for other screening for malignant neoplasm of breast: Secondary | ICD-10-CM

## 2016-10-14 DIAGNOSIS — F4323 Adjustment disorder with mixed anxiety and depressed mood: Secondary | ICD-10-CM

## 2016-10-14 DIAGNOSIS — Z1231 Encounter for screening mammogram for malignant neoplasm of breast: Secondary | ICD-10-CM

## 2016-10-14 DIAGNOSIS — R7303 Prediabetes: Secondary | ICD-10-CM

## 2016-10-14 DIAGNOSIS — E782 Mixed hyperlipidemia: Secondary | ICD-10-CM

## 2016-10-14 MED ORDER — PRAVASTATIN SODIUM 80 MG PO TABS: 80.0000 mg | ORAL_TABLET | Freq: Every day | ORAL | 0 refills | Status: DC

## 2016-10-14 MED ORDER — OXYBUTYNIN CHLORIDE 5 MG PO TABS: ORAL_TABLET | ORAL | 1 refills | Status: DC

## 2016-10-14 MED ORDER — CITALOPRAM HYDROBROMIDE 40 MG PO TABS: 40.0000 mg | ORAL_TABLET | Freq: Every day | ORAL | 1 refills | Status: DC

## 2016-10-14 NOTE — Patient Instructions (Signed)
Welcome to medicare in February, call if you need me before   CBC, fasting lipid, cmp, TSH and vitamin D   Early Feb  You are referred for a mammogram in January, pls schedule once you get your medicare card  Please work on good  health habits so that your health will improve. 1. Commitment to daily physical activity for 30 to 60  minutes, if you are able to do this.  2. Commitment to wise food choices. Aim for half of your  food intake to be vegetable and fruit, one quarter starchy foods, and one quarter protein. Try to eat on a regular schedule  3 meals per day, snacking between meals should be limited to vegetables or fruits or small portions of nuts. 64 ounces of water per day is generally recommended, unless you have specific health conditions, like heart failure or kidney failure where you will need to limit fluid intake.  3. Commitment to sufficient and a  good quality of physical and mental rest daily, generally between 6 to 8 hours per day.  WITH PERSISTANCE AND PERSEVERANCE, THE IMPOSSIBLE , BECOMES THE NORM!  Thank you  for choosing [facility]. We consider it a privelige to serve you.  Delivering excellent health care in a caring and  compassionate way is our goal.  Partnering with you,  so that together we can achieve this goal is our strategy.

## 2016-10-19 NOTE — Assessment & Plan Note (Signed)
Hyperlipidemia:Low fat diet discussed and encouraged.   Lipid Panel  Lab Results  Component Value Date   CHOL 253 (H) 01/26/2016   HDL 70 01/26/2016   LDLCALC 164 (H) 01/26/2016   TRIG 97 01/26/2016   CHOLHDL 3.6 01/26/2016  Updated lab needed at/ before next visit.

## 2016-10-19 NOTE — Assessment & Plan Note (Signed)
Controlled, no change in medication  

## 2016-10-19 NOTE — Assessment & Plan Note (Signed)
Patient educated about the importance of limiting  Carbohydrate intake , the need to commit to daily physical activity for a minimum of 30 minutes , and to commit weight loss. The fact that changes in all these areas will reduce or eliminate all together the development of diabetes is stressed.  Updated lab needed at/ before next visit.   Diabetic Labs Latest Ref Rng & Units 01/26/2016 04/12/2014 09/11/2013 12/31/2012 07/15/2012  HbA1c <5.7 % 5.9(H) 6.2(H) - - -  Chol 125 - 200 mg/dL 253(H) 233(H) 258(H) 243(H) 318(H)  HDL >=46 mg/dL 70 65 58 65 59  Calc LDL <130 mg/dL 164(H) 138(H) 170(H) 147(H) 228(H)  Triglycerides <150 mg/dL 97 150(H) 149 154(H) 155(H)  Creatinine 0.50 - 0.99 mg/dL 0.89 0.88 0.89 - -   BP/Weight 10/14/2016 02/01/2016 02/06/2015 09/11/2014 04/06/2014 09/08/2013 [id number]  Systolic BP 123456 XX123456 AB-[id number] 123456 123456 0000000 [id number]  Diastolic BP 80 88 82 123456 80 80 82  Wt. (Lbs) 162.12 158 167.12 150 160.12 160 157  BMI 28.72 28 29.61 26.58 28.37 28.35 27.82   No flowsheet data found.

## 2016-10-19 NOTE — Progress Notes (Signed)
[name]     MRN: [id number]      DOB: 05-Mar-1951   HPI [name] is here for follow up and re-evaluation of chronic medical conditions, medication management and review of any available recent lab and radiology data.  Preventive health is updated, specifically  Cancer screening and Immunization.   Questions or concerns regarding consultations or procedures which the PT has had in the interim are  addressed. The PT denies any adverse reactions to current medications since the last visit.  There are no new concerns.  There are no specific complaints   ROS Denies recent fever or chills. Denies sinus pressure, nasal congestion, ear pain or sore throat. Denies chest congestion, productive cough or wheezing. Denies chest pains, palpitations and leg swelling Denies abdominal pain, nausea, vomiting,diarrhea or constipation.   Denies dysuria, frequency, hesitancy or incontinence. Denies joint pain, swelling and limitation in mobility. Denies headaches, seizures, numbness, or tingling. Denies depression, anxiety or insomnia. Denies skin break down or rash.   PE  BP 120/80 (BP Location: Left Arm, Patient Position: Sitting, Cuff Size: Normal)   Pulse 88   Temp 98.3 F (36.8 C) (Oral)   Resp 18   Ht 5\' 3"  (1.6 m)   Wt 162 lb 1.9 oz (73.5 kg)   SpO2 98%   BMI 28.72 kg/m   Patient alert and oriented and in no cardiopulmonary distress.  HEENT: No facial asymmetry, EOMI,   oropharynx pink and moist.  Neck supple no JVD, no mass.  Chest: Clear to auscultation bilaterally.  CVS: S1, S2 no murmurs, no S3.Regular rate.  ABD: Soft non tender.   Ext: No edema  MS: Adequate ROM spine, shoulders, hips and knees.  Skin: Intact, no ulcerations or rash noted.  Psych: Good eye contact, normal affect. Memory intact not anxious or depressed appearing.  CNS: CN 2-12 intact, power,  normal throughout.no focal deficits noted.   Assessment & Plan  Hyperlipemia Hyperlipidemia:Low fat  diet discussed and encouraged.   Lipid Panel  Lab Results  Component Value Date   CHOL 253 (H) 01/26/2016   HDL 70 01/26/2016   LDLCALC 164 (H) 01/26/2016   TRIG 97 01/26/2016   CHOLHDL 3.6 01/26/2016  Updated lab needed at/ before next visit.    Prediabetes Patient educated about the importance of limiting  Carbohydrate intake , the need to commit to daily physical activity for a minimum of 30 minutes , and to commit weight loss. The fact that changes in all these areas will reduce or eliminate all together the development of diabetes is stressed.  Updated lab needed at/ before next visit.   Diabetic Labs Latest Ref Rng & Units 01/26/2016 04/12/2014 09/11/2013 12/31/2012 07/15/2012  HbA1c <5.7 % 5.9(H) 6.2(H) - - -  Chol 125 - 200 mg/dL 253(H) 233(H) 258(H) 243(H) 318(H)  HDL >=46 mg/dL 70 65 58 65 59  Calc LDL <130 mg/dL 164(H) 138(H) 170(H) 147(H) 228(H)  Triglycerides <150 mg/dL 97 150(H) 149 154(H) 155(H)  Creatinine 0.50 - 0.99 mg/dL 0.89 0.88 0.89 - -   BP/Weight 10/14/2016 02/01/2016 02/06/2015 09/11/2014 04/06/2014 09/08/2013 [id number]  Systolic BP 123456 XX123456 [id number] 123456 123456 0000000 [id number]  Diastolic BP 80 88 82 123456 80 80 82  Wt. (Lbs) 162.12 158 167.12 150 160.12 160 157  BMI 28.72 28 29.61 26.58 28.37 28.35 27.82   No flowsheet data found.    Adjustment reaction with anxiety and depression Controlled, no change in medication   OVERACTIVE BLADDER Controlled, no change  in medication

## 2016-10-29 ENCOUNTER — Telehealth: Payer: Self-pay | Admitting: Family Medicine

## 2016-10-29 NOTE — Telephone Encounter (Signed)
She may have a refill of her Tessalon.

## 2016-10-29 NOTE — Telephone Encounter (Signed)
Patient states that she has had productive cough x 48 hours.  Is taking otc cough med that she purchased from [place] but is unable to give me the name.  Is asking for [employer]  Also is complaining of nasal drainage and pressure.  Please advise.

## 2016-10-29 NOTE — Telephone Encounter (Signed)
[name] is asking if someone would call her in some cough medicine to [facility], please advise?

## 2016-10-30 ENCOUNTER — Other Ambulatory Visit: Payer: Self-pay

## 2016-10-30 MED ORDER — BENZONATATE 100 MG PO CAPS: ORAL_CAPSULE | ORAL | 0 refills | Status: DC

## 2016-10-30 NOTE — Telephone Encounter (Signed)
Patient aware.

## 2016-11-07 ENCOUNTER — Telehealth: Payer: Self-pay | Admitting: Family Medicine

## 2016-11-07 NOTE — Telephone Encounter (Signed)
[name] is asking for a refill on the tessalon pearls, I offered her an appointment with Dr. Meda Coffee and she declined, please advise?

## 2016-11-07 NOTE — Telephone Encounter (Signed)
Patient in for ov

## 2016-12-17 ENCOUNTER — Encounter: Payer: Self-pay | Admitting: Family Medicine

## 2017-01-22 ENCOUNTER — Other Ambulatory Visit: Payer: Self-pay | Admitting: Family Medicine

## 2017-02-10 ENCOUNTER — Other Ambulatory Visit: Payer: Self-pay | Admitting: Family Medicine

## 2017-02-10 DIAGNOSIS — F4323 Adjustment disorder with mixed anxiety and depressed mood: Secondary | ICD-10-CM

## 2017-02-10 DIAGNOSIS — E782 Mixed hyperlipidemia: Secondary | ICD-10-CM

## 2017-03-06 ENCOUNTER — Other Ambulatory Visit: Payer: Self-pay

## 2017-03-06 ENCOUNTER — Ambulatory Visit (INDEPENDENT_AMBULATORY_CARE_PROVIDER_SITE_OTHER): Payer: Medicare Other | Admitting: Family Medicine

## 2017-03-06 ENCOUNTER — Encounter: Payer: Self-pay | Admitting: Family Medicine

## 2017-03-06 VITALS — BP 138/80 | HR 87 | Resp 16 | Ht 63.0 in | Wt 162.0 lb

## 2017-03-06 DIAGNOSIS — R7303 Prediabetes: Secondary | ICD-10-CM

## 2017-03-06 DIAGNOSIS — E785 Hyperlipidemia, unspecified: Secondary | ICD-10-CM

## 2017-03-06 DIAGNOSIS — Z23 Encounter for immunization: Secondary | ICD-10-CM | POA: Diagnosis not present

## 2017-03-06 DIAGNOSIS — Z0000 Encounter for general adult medical examination without abnormal findings: Secondary | ICD-10-CM | POA: Diagnosis not present

## 2017-03-06 DIAGNOSIS — Z90710 Acquired absence of both cervix and uterus: Secondary | ICD-10-CM

## 2017-03-06 DIAGNOSIS — E8940 Asymptomatic postprocedural ovarian failure: Secondary | ICD-10-CM

## 2017-03-06 LAB — POCT URINALYSIS DIPSTICK
Bilirubin, UA: NEGATIVE
Blood, UA: NEGATIVE
GLUCOSE UA: NEGATIVE
Ketones, UA: NEGATIVE
NITRITE UA: NEGATIVE
PROTEIN UA: NEGATIVE
UROBILINOGEN UA: 0.2 U/dL
pH, UA: 6 (ref 5.0–8.0)

## 2017-03-06 NOTE — Patient Instructions (Addendum)
Pelvic and breast in September, call if you need me before  Prevnar today  Fasting labs in the next week please  You are being referred for bone density test  Urinalysis and EKG in office today are normal  Information is provided on Living will for you to follow through on this.  Commit to plant based/ healthy living please  Please commit to exercise 30 minutes at least 5 days per week  Please commit to 6 to 8 hours of rest/ sleep daily      Fall Prevention in the Home Falls can cause injuries. They can happen to people of all ages. There are many things you can do to make your home safe and to help prevent falls. What can I do on the outside of my home?  Regularly fix the edges of walkways and driveways and fix any cracks.  Remove anything that might make you trip as you walk through a door, such as a raised step or threshold.  Trim any bushes or trees on the path to your home.  Use bright outdoor lighting.  Clear any walking paths of anything that might make someone trip, such as rocks or tools.  Regularly check to see if handrails are loose or broken. Make sure that both sides of any steps have handrails.  Any raised decks and porches should have guardrails on the edges.  Have any leaves, snow, or ice cleared regularly.  Use sand or salt on walking paths during winter.  Clean up any spills in your garage right away. This includes oil or grease spills. What can I do in the bathroom?  Use night lights.  Install grab bars by the toilet and in the tub and shower. Do not use towel bars as grab bars.  Use non-skid mats or decals in the tub or shower.  If you need to sit down in the shower, use a plastic, non-slip stool.  Keep the floor dry. Clean up any water that spills on the floor as soon as it happens.  Remove soap buildup in the tub or shower regularly.  Attach bath mats securely with double-sided non-slip rug tape.  Do not have throw rugs and other  things on the floor that can make you trip. What can I do in the bedroom?  Use night lights.  Make sure that you have a light by your bed that is easy to reach.  Do not use any sheets or blankets that are too big for your bed. They should not hang down onto the floor.  Have a firm chair that has side arms. You can use this for support while you get dressed.  Do not have throw rugs and other things on the floor that can make you trip. What can I do in the kitchen?  Clean up any spills right away.  Avoid walking on wet floors.  Keep items that you use a lot in easy-to-reach places.  If you need to reach something above you, use a strong step stool that has a grab bar.  Keep electrical cords out of the way.  Do not use floor polish or wax that makes floors slippery. If you must use wax, use non-skid floor wax.  Do not have throw rugs and other things on the floor that can make you trip. What can I do with my stairs?  Do not leave any items on the stairs.  Make sure that there are handrails on both sides of the stairs and use  them. Fix handrails that are broken or loose. Make sure that handrails are as long as the stairways.  Check any carpeting to make sure that it is firmly attached to the stairs. Fix any carpet that is loose or worn.  Avoid having throw rugs at the top or bottom of the stairs. If you do have throw rugs, attach them to the floor with carpet tape.  Make sure that you have a light switch at the top of the stairs and the bottom of the stairs. If you do not have them, ask someone to add them for you. What else can I do to help prevent falls?  Wear shoes that:  Do not have high heels.  Have rubber bottoms.  Are comfortable and fit you well.  Are closed at the toe. Do not wear sandals.  If you use a stepladder:  Make sure that it is fully opened. Do not climb a closed stepladder.  Make sure that both sides of the stepladder are locked into place.  Ask  someone to hold it for you, if possible.  Clearly mark and make sure that you can see:  Any grab bars or handrails.  First and last steps.  Where the edge of each step is.  Use tools that help you move around (mobility aids) if they are needed. These include:  Canes.  Walkers.  Scooters.  Crutches.  Turn on the lights when you go into a dark area. Replace any light bulbs as soon as they burn out.  Set up your furniture so you have a clear path. Avoid moving your furniture around.  If any of your floors are uneven, fix them.  If there are any pets around you, be aware of where they are.  Review your medicines with your doctor. Some medicines can make you feel dizzy. This can increase your chance of falling. Ask your doctor what other things that you can do to help prevent falls. This information is not intended to replace advice given to you by your health care provider. Make sure you discuss any questions you have with your health care provider. Document Released: 08/10/2009 Document Revised: 03/21/2016 Document Reviewed: 11/18/2014 Elsevier Interactive Patient Education  2017 [employer].

## 2017-03-06 NOTE — Progress Notes (Signed)
Preventive Screening-Counseling & Management   Patient present here today for a welcome to  Medicare  wellness visit.   Current Problems (verified)   Medications Prior to Visit Allergies (verified)   PAST HISTORY  Family History (verified)  Social History Single, 3 children, 1 with down syndrome, never smoker.    Risk Factors  Current exercise habits: Walks 4-5 times per week     Dietary issues discussed: low fat and low carb encouraged. Eats variety of fruits and vegetables    Cardiac risk factors: hyperlipidemia, prediabetes, niece with premature CAD  Depression Screen  (Note: if answer to either of the following is "Yes", a more complete depression screening is indicated)   Over the past two weeks, have you felt down, depressed or hopeless? Some  Over the past two weeks, have you felt little interest or pleasure in doing things? No  Have you lost interest or pleasure in daily life? No  Do you often feel hopeless? No  Do you cry easily over simple problems? No   Activities of Daily Living  In your present state of health, do you have any difficulty performing the following activities?  Driving?: No Managing money?: No Feeding yourself?:No Getting from bed to chair?:No Climbing a flight of stairs?:No Preparing food and eating?:No Bathing or showering?:No Getting dressed?:No Getting to the toilet?:No Using the toilet?:No Moving around from place to place?: No  Fall Risk Assessment In the past year have you fallen or had a near fall?:No Are you currently taking any medications that make you dizzy ?:No   Hearing Difficulties: No Do you often ask people to speak up or repeat themselves?:No Do you experience ringing or noises in your ears?:No Do you have difficulty understanding soft or whispered voices?:No  Cognitive Testing  Alert? Yes Normal Appearance?Yes  Oriented to person? Yes Place? Yes  Time? Yes  Displays appropriate judgment?Yes  Can read the  correct time from a watch face? yes Are you having problems remembering things?No  Advanced Directives have been discussed with the patient?Yes. Will give brochure    List the Names of Other Physician/Practitioners you currently use:    Indicate any recent Medical Services you may have received from other than [facility] providers in the past year (date may be approximate).     Medicare Attestation  I have personally reviewed:  The patient's medical and social history  Their use of alcohol, tobacco or illicit drugs  Their current medications and supplements  The patient's functional ability including ADLs,fall risks, home safety risks, cognitive, and hearing and visual impairment  Diet and physical activities  Evidence for depression or mood disorders  The patient's weight, height, BMI, and visual acuity have been recorded in the chart. I have made referrals, counseling, and provided education to the patient based on review of the above and I have provided the patient with a written personalized care plan for preventive services.    Physical Exam BP 138/80   Pulse 87   Resp 16   Ht 5\' 3"  (1.6 m)   Wt 162 lb (73.5 kg)   SpO2 97%   BMI 28.70 kg/m    EKG: NSR, no ischemia Urinalysis: negative for sugar and protein  Assessment & Plan:  Welcome to Medicare preventive visit Annual exam as documented. Counseling done  re healthy lifestyle involving commitment to 150 minutes exercise per week, heart healthy diet, and attaining healthy weight.The importance of adequate sleep also discussed. Regular seat belt use and home safety, is also  discussed. Changes in health habits are decided on by the patient with goals and time frames  set for achieving them. Immunization and cancer screening needs are specifically addressed at this visit.   Need for vaccination with 13-polyvalent pneumococcal conjugate vaccine After obtaining informed consent, the vaccine is  administered by LPN.

## 2017-03-07 DIAGNOSIS — Z23 Encounter for immunization: Secondary | ICD-10-CM | POA: Insufficient documentation

## 2017-03-07 NOTE — Assessment & Plan Note (Signed)
After obtaining informed consent, the vaccine is  administered by LPN.  

## 2017-03-07 NOTE — Assessment & Plan Note (Signed)

## 2017-03-12 ENCOUNTER — Inpatient Hospital Stay (HOSPITAL_COMMUNITY): Admission: RE | Admit: 2017-03-12 | Payer: Medicare Other | Source: Ambulatory Visit

## 2017-03-18 ENCOUNTER — Telehealth: Payer: Self-pay

## 2017-03-18 NOTE — Telephone Encounter (Signed)
Called and left message this am requesting to be seen today. No reason was given. Called the number back that she provided- [phone] and it went straight to recording stating that voicemail wasn't set up yet.

## 2017-03-19 ENCOUNTER — Other Ambulatory Visit: Payer: Self-pay | Admitting: Family Medicine

## 2017-03-19 ENCOUNTER — Emergency Department (HOSPITAL_COMMUNITY): Payer: Medicare Other

## 2017-03-19 ENCOUNTER — Encounter: Payer: Self-pay | Admitting: Family Medicine

## 2017-03-19 ENCOUNTER — Encounter (HOSPITAL_COMMUNITY): Payer: Self-pay | Admitting: *Deleted

## 2017-03-19 ENCOUNTER — Emergency Department (HOSPITAL_COMMUNITY)
Admission: EM | Admit: 2017-03-19 | Discharge: 2017-03-19 | Disposition: A | Discharge status: Home / Self Care | Payer: Medicare Other | Attending: Emergency Medicine | Admitting: Emergency Medicine

## 2017-03-19 ENCOUNTER — Telehealth: Payer: Self-pay | Admitting: Family Medicine

## 2017-03-19 ENCOUNTER — Ambulatory Visit (INDEPENDENT_AMBULATORY_CARE_PROVIDER_SITE_OTHER): Payer: Medicare Other | Admitting: Family Medicine

## 2017-03-19 VITALS — BP 120/84 | HR 84 | Temp 98.5°F | Resp 16 | Ht 63.0 in | Wt 160.0 lb

## 2017-03-19 DIAGNOSIS — R109 Unspecified abdominal pain: Secondary | ICD-10-CM | POA: Diagnosis not present

## 2017-03-19 DIAGNOSIS — R1031 Right lower quadrant pain: Secondary | ICD-10-CM | POA: Insufficient documentation

## 2017-03-19 DIAGNOSIS — R1084 Generalized abdominal pain: Secondary | ICD-10-CM | POA: Diagnosis present

## 2017-03-19 DIAGNOSIS — Z7982 Long term (current) use of aspirin: Secondary | ICD-10-CM | POA: Diagnosis not present

## 2017-03-19 DIAGNOSIS — N839 Noninflammatory disorder of ovary, fallopian tube and broad ligament, unspecified: Secondary | ICD-10-CM | POA: Insufficient documentation

## 2017-03-19 DIAGNOSIS — N898 Other specified noninflammatory disorders of vagina: Secondary | ICD-10-CM | POA: Diagnosis not present

## 2017-03-19 DIAGNOSIS — R1900 Intra-abdominal and pelvic swelling, mass and lump, unspecified site: Secondary | ICD-10-CM

## 2017-03-19 DIAGNOSIS — N838 Other noninflammatory disorders of ovary, fallopian tube and broad ligament: Secondary | ICD-10-CM

## 2017-03-19 DIAGNOSIS — IMO0002 Reserved for concepts with insufficient information to code with codable children: Secondary | ICD-10-CM

## 2017-03-19 DIAGNOSIS — R110 Nausea: Secondary | ICD-10-CM

## 2017-03-19 DIAGNOSIS — R229 Localized swelling, mass and lump, unspecified: Secondary | ICD-10-CM

## 2017-03-19 DIAGNOSIS — Z79899 Other long term (current) drug therapy: Secondary | ICD-10-CM | POA: Insufficient documentation

## 2017-03-19 DIAGNOSIS — F4323 Adjustment disorder with mixed anxiety and depressed mood: Secondary | ICD-10-CM | POA: Diagnosis not present

## 2017-03-19 DIAGNOSIS — R1909 Other intra-abdominal and pelvic swelling, mass and lump: Secondary | ICD-10-CM | POA: Diagnosis not present

## 2017-03-19 LAB — CBC WITH DIFFERENTIAL/PLATELET
Basophils Absolute: 0 10*3/uL (ref 0.0–0.1)
Basophils Relative: 0 %
EOS ABS: 0.3 10*3/uL (ref 0.0–0.7)
EOS PCT: 5 %
HCT: 37.6 % (ref 36.0–46.0)
Hemoglobin: 12.6 g/dL (ref 12.0–15.0)
LYMPHS ABS: 2.4 10*3/uL (ref 0.7–4.0)
Lymphocytes Relative: 48 %
MCH: 30.7 pg (ref 26.0–34.0)
MCHC: 33.5 g/dL (ref 30.0–36.0)
MCV: 91.7 fL (ref 78.0–100.0)
MONOS PCT: 7 %
Monocytes Absolute: 0.4 10*3/uL (ref 0.1–1.0)
NEUTROS PCT: 40 %
Neutro Abs: 2.1 10*3/uL (ref 1.7–7.7)
PLATELETS: 273 10*3/uL (ref 150–400)
RBC: 4.1 MIL/uL (ref 3.87–5.11)
RDW: 13.4 % (ref 11.5–15.5)
WBC: 5.1 10*3/uL (ref 4.0–10.5)

## 2017-03-19 LAB — URINALYSIS, ROUTINE W REFLEX MICROSCOPIC
BILIRUBIN URINE: NEGATIVE
Glucose, UA: NEGATIVE mg/dL
HGB URINE DIPSTICK: NEGATIVE
KETONES UR: NEGATIVE mg/dL
Leukocytes, UA: NEGATIVE
NITRITE: NEGATIVE
Protein, ur: NEGATIVE mg/dL
SPECIFIC GRAVITY, URINE: 1.006 (ref 1.005–1.030)
pH: 7 (ref 5.0–8.0)

## 2017-03-19 LAB — COMPREHENSIVE METABOLIC PANEL
ALBUMIN: 3.9 g/dL (ref 3.5–5.0)
ALT: 32 U/L (ref 14–54)
ANION GAP: 7 (ref 5–15)
AST: 35 U/L (ref 15–41)
Alkaline Phosphatase: 45 U/L (ref 38–126)
BILIRUBIN TOTAL: 0.5 mg/dL (ref 0.3–1.2)
BUN: 9 mg/dL (ref 6–20)
CALCIUM: 9.8 mg/dL (ref 8.9–10.3)
CHLORIDE: 104 mmol/L (ref 101–111)
CO2: 27 mmol/L (ref 22–32)
Creatinine, Ser: 0.76 mg/dL (ref 0.44–1.00)
GFR calc Af Amer: >60 mL/min (ref >60)
GFR calc non Af Amer: >60 mL/min (ref >60)
GLUCOSE: 115 mg/dL — AB (ref 65–99)
POTASSIUM: 4.1 mmol/L (ref 3.5–5.1)
Sodium: 138 mmol/L (ref 135–145)
Total Protein: 7.5 g/dL (ref 6.5–8.1)

## 2017-03-19 LAB — LIPASE, BLOOD: Lipase: 18 U/L (ref 11–51)

## 2017-03-19 MED ORDER — MORPHINE SULFATE (PF) 2 MG/ML IV SOLN
2.0000 mg | INTRAVENOUS | Status: AC | PRN
  Administered 2017-03-19 (×2): 2 mg via INTRAVENOUS
  Filled 2017-03-19 (×2): qty 1

## 2017-03-19 MED ORDER — ONDANSETRON HCL 4 MG/2ML IJ SOLN
4.0000 mg | Freq: Once | INTRAMUSCULAR | Status: AC
  Administered 2017-03-19: 4 mg via INTRAMUSCULAR

## 2017-03-19 MED ORDER — DICYCLOMINE HCL 20 MG PO TABS
20.0000 mg | ORAL_TABLET | Freq: Four times a day (QID) | ORAL | 0 refills | Status: DC | PRN
Start: 2017-03-19 — End: 2017-04-17

## 2017-03-19 MED ORDER — IOPAMIDOL (ISOVUE-300) INJECTION 61%
100.0000 mL | Freq: Once | INTRAVENOUS | Status: AC | PRN
  Administered 2017-03-19: 100 mL via INTRAVENOUS

## 2017-03-19 MED ORDER — HYDROCODONE-ACETAMINOPHEN 5-325 MG PO TABS: ORAL_TABLET | ORAL | 0 refills | Status: DC

## 2017-03-19 MED ORDER — ONDANSETRON HCL 4 MG/2ML IJ SOLN
4.0000 mg | INTRAMUSCULAR | Status: DC | PRN
  Administered 2017-03-19: 4 mg via INTRAVENOUS
  Filled 2017-03-19: qty 2

## 2017-03-19 NOTE — ED Provider Notes (Signed)
[facility] DEPT Provider Note   CSN: [id number] Arrival date & time: 03/19/17  6283     History   Chief Complaint Chief Complaint  Patient presents with  . Abdominal Pain    HPI [name] is a 66 y.o. female.  HPI  Pt was seen at 0955.  Per pt, c/o gradual onset and persistence of constant generalized abd "pain" for the past "while" but worse over the past 4 to 5 days.  Has been associated with nausea.  Denies vomiting/diarrhea, no fevers, no back pain, no rash, no CP/SOB, no black or blood in stools.      Past Medical History:  Diagnosis Date  . Allergy   . Anxiety   . GERD (gastroesophageal reflux disease)   . Hyperlipidemia   . Osteoarthritis   . TIA (transient ischemic attack) 2012, hospitalised    Patient Active Problem List   Diagnosis Date Noted  . RLQ abdominal pain 03/19/2017  . Need for vaccination with 13-polyvalent pneumococcal conjugate vaccine 03/07/2017  . Welcome to Medicare preventive visit 03/06/2017  . Prediabetes 03/30/2015  . Back pain with radiation 07/13/2012  . Hyperlipemia 06/22/2008  . Allergic rhinitis 01/08/2008  . OVERACTIVE BLADDER 04/10/2007  . GAD (generalized anxiety disorder) 08/22/2006  . Adjustment reaction with anxiety and depression 08/22/2006  . OSTEOARTHRITIS 08/22/2006    Past Surgical History:  Procedure Laterality Date  . cyst removed from right breast    . PARTIAL HYSTERECTOMY    . TUBAL LIGATION      OB History    No data available       Home Medications    Prior to Admission medications   Medication Sig Start Date End Date Taking? Authorizing Provider  aspirin 81 MG tablet Take 81 mg by mouth daily.    [provider]  citalopram (CELEXA) 40 MG tablet TAKE ONE TABLET BY MOUTH ONCE DAILY. 02/11/17   Fayrene Helper, MD  gabapentin (NEURONTIN) 300 MG capsule TAKE (1) CAPSULE BY MOUTH TWICE DAILY. 10/03/16   Fayrene Helper, MD  loratadine (CLARITIN) 10 MG tablet TAKE (1) TABLET BY MOUTH  ONCE DAILY AS NEEDED FOR ALLERGIES. 02/11/17   Fayrene Helper, MD  Multiple Vitamins-Minerals (MULTIVITAMIN PO) Take by mouth.    [provider]  Omega-3 Fatty Acids (FISH OIL) 1000 MG CAPS TAKE 2 CAPSULES (2 G TOTAL) BY MOUTH DAILY. 12/27/13   Fayrene Helper, MD  oxybutynin (DITROPAN) 5 MG tablet TAKE (1) TABLET BY MOUTH TWICE DAILY. 10/14/16   Fayrene Helper, MD  pravastatin (PRAVACHOL) 80 MG tablet TAKE ONE TABLET BY MOUTH ONCE DAILY. 02/11/17   Fayrene Helper, MD  silver sulfADIAZINE (SILVADENE) 1 % cream Apply daily to affected area until healed 01/18/16   Fayrene Helper, MD  temazepam (RESTORIL) 15 MG capsule TAKE 1 CAPSULE AT BEDTIME AS NEEDED FOR SLEEP. 01/22/17   Fayrene Helper, MD    Family History Family History  Problem Relation Age of Onset  . Heart disease Mother   . Heart disease Father   . Diabetes Father   . Hypertension Sister   . Hypertension Brother   . Heart disease Maternal Grandmother   . Cancer Paternal Grandmother        breast  . Diabetes Paternal Grandfather   . Hypertension Sister   . Hypertension Sister     Social History Social History  Substance Use Topics  . Smoking status: Never Smoker  . Smokeless tobacco: Never Used  .  Alcohol use Yes     Comment: occasionally wine     Allergies   Ibuprofen   Review of Systems Review of Systems ROS: Statement: All systems negative except as marked or noted in the HPI; Constitutional: Negative for fever and chills. ; ; Eyes: Negative for eye pain, redness and discharge. ; ; ENMT: Negative for ear pain, hoarseness, nasal congestion, sinus pressure and sore throat. ; ; Cardiovascular: Negative for chest pain, palpitations, diaphoresis, dyspnea and peripheral edema. ; ; Respiratory: Negative for cough, wheezing and stridor. ; ; Gastrointestinal: +nausea, abd pain. Negative for vomiting, diarrhea, blood in stool, hematemesis, jaundice and rectal bleeding. . ; ; Genitourinary:  Negative for dysuria, flank pain and hematuria. ; ; Musculoskeletal: Negative for back pain and neck pain. Negative for swelling and trauma.; ; Skin: Negative for pruritus, rash, abrasions, blisters, bruising and skin lesion.; ; Neuro: Negative for headache, lightheadedness and neck stiffness. Negative for weakness, altered level of consciousness, altered mental status, extremity weakness, paresthesias, involuntary movement, seizure and syncope.       Physical Exam Updated Vital Signs BP (!) 182/98 (BP Location: Left Arm)   Pulse 68   Temp 98.1 F (36.7 C) (Oral)   Resp 18   Ht 5\' 3"  (1.6 m)   Wt 72.6 kg (160 lb)   SpO2 97%   BMI 28.34 kg/m   Physical Exam 1000: Physical examination:  Nursing notes reviewed; Vital signs and O2 SAT reviewed;  Constitutional: Well developed, Well nourished, Well hydrated, In no acute distress; Head:  Normocephalic, atraumatic; Eyes: EOMI, PERRL, No scleral icterus; ENMT: Mouth and pharynx normal, Mucous membranes moist; Neck: Supple, Full range of motion, No lymphadenopathy; Cardiovascular: Regular rate and rhythm, No gallop; Respiratory: Breath sounds clear & equal bilaterally, No wheezes.  Speaking full sentences with ease, Normal respiratory effort/excursion; Chest: Nontender, Movement normal; Abdomen: Soft, +right mid-lateral abd tenderness to palp. No rebound or guarding. Nondistended, Normal bowel sounds; Genitourinary: No CVA tenderness; Extremities: Pulses normal, No tenderness, No edema, No calf edema or asymmetry.; Neuro: AA&Ox3, Major CN grossly intact.  Speech clear. No gross focal motor or sensory deficits in extremities.; Skin: Color normal, Warm, Dry.   ED Treatments / Results  Labs (all labs ordered are listed, but only abnormal results are displayed)   EKG  EKG Interpretation None       Radiology   Procedures Procedures (including critical care time)  Medications Ordered in ED Medications  morphine 2 MG/ML injection 2 mg (not  administered)  ondansetron (ZOFRAN) injection 4 mg (not administered)     Initial Impression / Assessment and Plan / ED Course  I have reviewed the triage vital signs and the nursing notes.  Pertinent labs & imaging results that were available during my care of the patient were reviewed by me and considered in my medical decision making (see chart for details).  MDM Reviewed: previous chart, nursing note and vitals Reviewed previous: labs Interpretation: labs, CT scan and ultrasound   Results for orders placed or performed during the hospital encounter of 03/19/17  Lipase, blood  Result Value Ref Range   Lipase 18 11 - 51 U/L  Comprehensive metabolic panel  Result Value Ref Range   Sodium 138 135 - 145 mmol/L   Potassium 4.1 3.5 - 5.1 mmol/L   Chloride 104 101 - 111 mmol/L   CO2 27 22 - 32 mmol/L   Glucose, Bld 115 (H) 65 - 99 mg/dL   BUN 9 6 - 20 mg/dL  Creatinine, Ser 0.76 0.44 - 1.00 mg/dL   Calcium 9.8 8.9 - 10.3 mg/dL   Total Protein 7.5 6.5 - 8.1 g/dL   Albumin 3.9 3.5 - 5.0 g/dL   AST 35 15 - 41 U/L   ALT 32 14 - 54 U/L   Alkaline Phosphatase 45 38 - 126 U/L   Total Bilirubin 0.5 0.3 - 1.2 mg/dL   GFR calc non Af Amer >60 >60 mL/min   GFR calc Af Amer >60 >60 mL/min   Anion gap 7 5 - 15  Urinalysis, Routine w reflex microscopic  Result Value Ref Range   Color, Urine STRAW (A) YELLOW   APPearance CLEAR CLEAR   Specific Gravity, Urine 1.006 1.005 - 1.030   pH 7.0 5.0 - 8.0   Glucose, UA NEGATIVE NEGATIVE mg/dL   Hgb urine dipstick NEGATIVE NEGATIVE   Bilirubin Urine NEGATIVE NEGATIVE   Ketones, ur NEGATIVE NEGATIVE mg/dL   Protein, ur NEGATIVE NEGATIVE mg/dL   Nitrite NEGATIVE NEGATIVE   Leukocytes, UA NEGATIVE NEGATIVE  CBC with Differential  Result Value Ref Range   WBC 5.1 4.0 - 10.5 K/uL   RBC 4.10 3.87 - 5.11 MIL/uL   Hemoglobin 12.6 12.0 - 15.0 g/dL   HCT 37.6 36.0 - 46.0 %   MCV 91.7 78.0 - 100.0 fL   MCH 30.7 26.0 - 34.0 pg   MCHC 33.5 30.0 -  36.0 g/dL   RDW 13.4 11.5 - 15.5 %   Platelets 273 150 - 400 K/uL   Neutrophils Relative % 40 %   Neutro Abs 2.1 1.7 - 7.7 K/uL   Lymphocytes Relative 48 %   Lymphs Abs 2.4 0.7 - 4.0 K/uL   Monocytes Relative 7 %   Monocytes Absolute 0.4 0.1 - 1.0 K/uL   Eosinophils Relative 5 %   Eosinophils Absolute 0.3 0.0 - 0.7 K/uL   Basophils Relative 0 %   Basophils Absolute 0.0 0.0 - 0.1 K/uL   US Pelvis Complete Result Date: 03/19/2017 CLINICAL DATA:  Pelvic pain for 6 months. EXAM: TRANSABDOMINAL AND TRANSVAGINAL ULTRASOUND OF PELVIS TECHNIQUE: Both transabdominal and transvaginal ultrasound examinations of the pelvis were performed. Transabdominal technique was performed for global imaging of the pelvis including uterus, ovaries, adnexal regions, and pelvic cul-de-sac. It was necessary to proceed with endovaginal exam following the transabdominal exam to visualize the uterus and ovaries. COMPARISON:  CT 03/19/2017. FINDINGS: Uterus Measurements: 7.4 x 2.9 x 5.1 cm . No fibroids or other mass visualized. Endometrium Thickness: 2.1 mm.  No focal abnormality visualized. Right ovary Measurements: 2.0 x 1.2 x 2.2 cm. Normal appearance/no adnexal mass. Left ovary Not visualized Other findings 9.2 x 4.4 x 4.5 cm complex mass noted in the pelvis posterior to the a uterus as noted on prior CT. This mass appears to contain calcification. Again a process such as a dermoid or teratoma is favored. Other etiologies including ovarian malignancy cannot be excluded. Gynecologic evaluation suggested. IMPRESSION: 9.2 x 4.4 x 4.5 cm complex mass noted the pelvis posterior to the uterus as noted on prior CT. This mass appears to contain calcification. Again a process such as a dermoid or teratoma is favored. Gynecologic evaluation suggested. Electronically Signed   By: Marcello Moores  Register   On: 03/19/2017 12:25   Ct Abdomen Pelvis W Contrast Result Date: 03/19/2017 CLINICAL DATA:  Severe nausea.  Right lower quadrant pain.  EXAM: CT ABDOMEN AND PELVIS WITH CONTRAST TECHNIQUE: Multidetector CT imaging of the abdomen and pelvis was performed using the standard  protocol following bolus administration of intravenous contrast. CONTRAST:  172mL ISOVUE-300 IOPAMIDOL (ISOVUE-300) INJECTION 61% COMPARISON:  None. FINDINGS: Lower chest: No acute finding. Nodular density along the lateral lower right breast was also seen on mammography from 2014. Hepatobiliary: No focal liver abnormality.No evidence of biliary obstruction or stone. Pancreas: Unremarkable. Spleen: Unremarkable. Adrenals/Urinary Tract: Negative adrenals. No hydronephrosis or stone. Unremarkable bladder. Stomach/Bowel: A few loops of small bowel have fecalized contents, but no transition point or bowel wall thickening. No appendicitis. Vascular/Lymphatic: No acute vascular abnormality. No mass or adenopathy. Reproductive:Cystic mass in the rectovaginal recess, right eccentric, measuring up to 8 cm. The mass has a fluid fluid level, which may include lipid superiorly. Small mural calcification is noted. No evidence of neighboring ovarian edema. Other: No ascites or pneumoperitoneum. Musculoskeletal: No acute abnormalities. Prominent facet arthropathy, especially of the lumbar spine. Prominent thoracic spondylosis. Advanced diffuse facet arthropathy IMPRESSION: 1. 8 cm cystic mass in the rectovaginal recess with fluid fluid level, possibly containing lipid. Dermoid is favored, recommend pelvic ultrasound follow-up. 2. A few bowel loops contain fecalized material as seen with slow transit, but no transition point or inflammatory wall thickening. 3. No appendicitis. Electronically Signed   By: Monte Fantasia M.D.   On: 03/19/2017 11:10    1305:   T/C to OB/GYN Dr. Elonda Husky, case discussed, including:  HPI, pertinent PM/SHx, VS/PE, dx testing, ED course and treatment:  Agreeable to f/u in office next week. Pt has tol PO well whle in the ED without N/V. No stooling while in the ED. Pt is  ready to go home now. Dx and testing d/w pt and family.  Questions answered.  Verb understanding, agreeable to d/c home with outpt f/u.    Final Clinical Impressions(s) / ED Diagnoses   Final diagnoses:  None    New Prescriptions New Prescriptions   No medications on file     Francine Graven, DO 03/22/17 1444

## 2017-03-19 NOTE — Progress Notes (Signed)
   [name]     MRN: [id number]      DOB: 1951/07/11   HPI [name] is here with a  5 day h/o increased and uncontrolled RLQ abdominal pain, awakening her from sleep and preventing sleep, associated with nausea and radiating down right thigh. Denies fever or chills, denies change in stool. C/o poor appetite, states she is afraid she will die, has a mentally challenged son who she is afraid to die and leave . Tearful, crying , weak, and nearlyfell to the floor when she got up off the exam table, experienced pain in sharp spells and c/o significant nausea  No h/o fever or chills  ROS Denies recent fever or chills. Denies sinus pressure, nasal congestion, ear pain or sore throat. Denies chest congestion, productive cough or wheezing. Denies chest pains, palpitations and leg swelling Denies diarrhea or constipation.   Denies dysuria, frequency, hesitancy or incontinence. Denies joint pain, swelling and limitation in mobility. Denies headaches, seizures, numbness, or tingling. C/o fear  anxiety and  insomnia. Denies skin break down or rash.   PE  BP 120/84   Pulse 84   Temp 98.5 F (36.9 C) (Oral)   Resp 16   Ht 5\' 3"  (1.6 m)   Wt 160 lb (72.6 kg)   SpO2 97%   BMI 28.34 kg/m   Tearful, anxious and afraid and in pain  Patient alert and oriented and in no cardiopulmonary distress.  HEENT: No facial asymmetry, EOMI,   oropharynx pink and moist.  Neck supple no JVD, no mass.  Chest: Clear to auscultation bilaterally.  CVS: S1, S2 no murmurs, no S3.Regular rate.  ABD: Soft , Marked RLQ tenderness with rebound, no palpable organomegaly or mass, normal BS Rectal not done  Ext: No edema  MS: Adequate ROM spine, shoulders, hips and knees.    Assessment & Plan  RLQ abdominal pain 5 day h/o severe RLQ pain with nausea, tender with guarding on exam, needs to go to ED for further evaluation and management. Discussed with ED Physician and patient taken to the ED Concern is  for acute abdomen  Nausea  Zofran 4 mg IM administered in the office   Adjustment reaction with anxiety and depression Pt extremely anxious , and afraid, mental health needs may  need to be re addressed in the future

## 2017-03-19 NOTE — Assessment & Plan Note (Addendum)
5 day h/o severe RLQ pain with nausea, tender with guarding on exam, needs to go to ED for further evaluation and management. Discussed with ED Physician and patient taken to the ED Concern is for acute abdomen

## 2017-03-19 NOTE — Telephone Encounter (Signed)
Patients daughter [name] (not on hipaa) is requesting someone to call her, she is aware her mother is in the ER states she received a msg from a family member, I can not talk to her [phone]    ** dawn is not on hipaa**

## 2017-03-19 NOTE — Discharge Instructions (Signed)
Take the prescriptions as directed.  Call your regular medical doctor today to schedule a follow up appointment within the next 2 days. Call the OB/GYN doctor today to schedule a follow up appointment within the next week.  Return to the Emergency Department immediately sooner if worsening.

## 2017-03-19 NOTE — Assessment & Plan Note (Signed)
Pt extremely anxious , and afraid, mental health needs may  need to be re addressed in the future

## 2017-03-19 NOTE — ED Notes (Signed)
Pt reports right lower abd pain that radiates at times down right thigh. Reports pain has been present "for a while", but has gotten worse

## 2017-03-19 NOTE — Telephone Encounter (Signed)
I spoke directly with daughter explained the situation that I am unable to discuss her Mom's health with her and that she can directly contact he Aunt who I know contacted her with the info tha her Mom was in the ED I also advised her that she should have her name added to her Mother's record since I was aware that her Mother asked that she be notified  Stated she understood

## 2017-03-19 NOTE — Assessment & Plan Note (Signed)
Zofran 4 mg IM administered in the office

## 2017-03-19 NOTE — Patient Instructions (Addendum)
You need to go to the ED For further evaluation of abdominal pain and nausea.  I will speak with the Doctor in the ED  You received zofran for nausea

## 2017-03-19 NOTE — ED Notes (Signed)
Pt returned from CT °

## 2017-03-19 NOTE — ED Notes (Signed)
Patient transported to Ultrasound 

## 2017-03-19 NOTE — ED Triage Notes (Signed)
Pt was dropped off here by [name]. States she had had RLQ pain for 5 days. Pt has had nausea without vomiting. Denies any diarrhea. Last BM was yesterday, which was normal.

## 2017-03-20 ENCOUNTER — Telehealth: Payer: Self-pay | Admitting: Family Medicine

## 2017-03-20 NOTE — Telephone Encounter (Signed)
I called [facility] to make this appt, the confirmed that they received the urgent referral, they said they were calling the patient directly when they hung up with me. I will check the workqueque and send you the date.

## 2017-03-20 NOTE — Telephone Encounter (Signed)
Direct contact made with pt ensuring that she understands clearly the need to keep appointment with gynecology re pelvic mass causing pain which she presented with. I am forwarding this message to referal staff to directly contact gyne office to get appt info and relay to pt, Dr Elonda Husky was directly contacted byED staff yesterday

## 2017-03-20 NOTE — Telephone Encounter (Signed)
[name] pls explain I was only trying to tell her she needs to see Dr Elonda Husky and that you will give her the appt info, pls try to follow up on the appointment date as we have been discussing. I am aware the phone cut off but I essentially told her everything that I knew up to the time the phone cut off

## 2017-03-20 NOTE — Telephone Encounter (Signed)
Patient states her phone went dead during a conversation with Dr.Simpson, she is requesting a returned call about her surgery to [phone]

## 2017-03-21 ENCOUNTER — Encounter: Payer: Self-pay | Admitting: Obstetrics & Gynecology

## 2017-03-21 ENCOUNTER — Ambulatory Visit (INDEPENDENT_AMBULATORY_CARE_PROVIDER_SITE_OTHER): Payer: Medicare Other | Admitting: Obstetrics & Gynecology

## 2017-03-21 VITALS — BP 146/70 | HR 86 | Ht 63.0 in | Wt 162.0 lb

## 2017-03-21 DIAGNOSIS — D270 Benign neoplasm of right ovary: Secondary | ICD-10-CM | POA: Diagnosis not present

## 2017-03-21 DIAGNOSIS — R1031 Right lower quadrant pain: Secondary | ICD-10-CM

## 2017-03-21 DIAGNOSIS — G8929 Other chronic pain: Secondary | ICD-10-CM

## 2017-03-21 NOTE — Progress Notes (Signed)
Preoperative History and Physical  [name] is a 66 y.o. G3P3003 with No LMP recorded. Patient has had a hysterectomy. 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 Not her left tube and ovary, or her uterus, only wants the problem removed    PMH:    Past Medical History:  Diagnosis Date  . Allergy   . Anxiety   . GERD (gastroesophageal reflux disease)   . Hyperlipidemia   . Osteoarthritis     PSH:     Past Surgical History:  Procedure Laterality Date  . cyst removed from right breast    . PARTIAL HYSTERECTOMY    . TUBAL LIGATION      POb/GynH:      OB History    Gravida Para Term Preterm AB Living   3 3 3     3    SAB TAB Ectopic Multiple Live Births           3      SH:   Social History  Substance Use Topics  . Smoking status: Never Smoker  . Smokeless tobacco: Never Used  . Alcohol use Yes     Comment: occasionally wine    FH:    Family History  Problem Relation Age of Onset  . Heart disease Mother   . Heart disease Father   . Diabetes Father   . Hypertension Sister   . Parkinson's disease Brother   . Heart disease Maternal Grandmother   . Cancer Paternal Grandmother        breast  . Diabetes Paternal Grandfather   . Hypertension Sister   . Hypertension Sister   . Down syndrome Son   . Eczema Son   . Post-traumatic stress disorder Daughter      Allergies:  Allergies  Allergen Reactions  . Ibuprofen Other (See Comments)    Rapid heart rate.    Medications:       Current Outpatient Prescriptions:  .  aspirin 81 MG tablet, Take 81 mg by mouth daily., Disp: , Rfl:  .  citalopram (CELEXA) 40 MG tablet, TAKE ONE TABLET BY MOUTH ONCE DAILY., Disp: 90 tablet, Rfl: 1 .  dicyclomine (BENTYL) 20 MG tablet, Take 1 tablet (20 mg total) by mouth every 6 (six) hours as needed for spasms (abdominal cramping).,  Disp: 15 tablet, Rfl: 0 .  gabapentin (NEURONTIN) 300 MG capsule, TAKE (1) CAPSULE BY MOUTH TWICE DAILY. (Patient taking differently: TAKE (1) CAPSULE BY MOUTH PRN), Disp: 180 capsule, Rfl: 1 .  HYDROcodone-acetaminophen (NORCO/VICODIN) 5-325 MG tablet, 1 or 2 tabs PO q6 hours prn pain, Disp: 12 tablet, Rfl: 0 .  loratadine (CLARITIN) 10 MG tablet, TAKE (1) TABLET BY MOUTH ONCE DAILY AS NEEDED FOR ALLERGIES., Disp: 90 tablet, Rfl: 1 .  Multiple Vitamins-Minerals (MULTIVITAMIN PO), Take 1 tablet by mouth daily. , Disp: , Rfl:  .  Omega-3 Fatty Acids (FISH OIL) 1000 MG CAPS, TAKE 2 CAPSULES (2 G TOTAL) BY MOUTH DAILY., Disp: 90 each, Rfl: 11 .  oxybutynin (DITROPAN) 5 MG tablet, TAKE (1) TABLET BY MOUTH TWICE DAILY. (Patient taking differently: Take 5 mg by mouth daily as needed for bladder spasms. TAKE (1) TABLET BY MOUTH TWICE DAILY.), Disp: 180 tablet, Rfl: 1 .  pravastatin (PRAVACHOL) 80 MG tablet, TAKE ONE TABLET BY MOUTH ONCE DAILY., Disp: 90 tablet, Rfl: 1 .  temazepam (RESTORIL) 15 MG capsule, TAKE 1 CAPSULE AT BEDTIME AS NEEDED FOR SLEEP., Disp: 30 capsule, Rfl: 2  Review of Systems:   Review of Systems  Constitutional: Negative for fever, chills, weight loss, malaise/fatigue and diaphoresis.  HENT: Negative for hearing loss, ear pain, nosebleeds, congestion, sore throat, neck pain, tinnitus and ear discharge.   Eyes: Negative for blurred vision, double vision, photophobia, pain, discharge and redness.  Respiratory: Negative for cough, hemoptysis, sputum production, shortness of breath, wheezing and stridor.   Cardiovascular: Negative for chest pain, palpitations, orthopnea, claudication, leg swelling and PND.  Gastrointestinal: Positive for abdominal pain. Negative for heartburn, nausea, vomiting, diarrhea, constipation, blood in stool and melena.  Genitourinary: Negative for dysuria, urgency, frequency, hematuria and flank pain.  Musculoskeletal: Negative for myalgias, back pain, joint  pain and falls.  Skin: Negative for itching and rash.  Neurological: Negative for dizziness, tingling, tremors, sensory change, speech change, focal weakness, seizures, loss of consciousness, weakness and headaches.  Endo/Heme/Allergies: Negative for environmental allergies and polydipsia. Does not bruise/bleed easily.  Psychiatric/Behavioral: Negative for depression, suicidal ideas, hallucinations, memory loss and substance abuse. The patient is not nervous/anxious and does not have insomnia.      PHYSICAL EXAM:  Blood pressure (!) 146/70, pulse 86, height 5\' 3"  (1.6 m), weight 162 lb (73.5 kg).    Vitals reviewed. Constitutional: She is oriented to person, place, and time. She appears well-developed and well-nourished.  HENT:  Head: Normocephalic and atraumatic.  Right Ear: External ear normal.  Left Ear: External ear normal.  Nose: Nose normal.  Mouth/Throat: Oropharynx is clear and moist.  Eyes: Conjunctivae and EOM are normal. Pupils are equal, round, and reactive to light. Right eye exhibits no discharge. Left eye exhibits no discharge. No scleral icterus.  Neck: Normal range of motion. Neck supple. No tracheal deviation present. No thyromegaly present.  Cardiovascular: Normal rate, regular rhythm, normal heart sounds and intact distal pulses.  Exam reveals no gallop and no friction rub.   No murmur heard. Respiratory: Effort normal and breath sounds normal. No respiratory distress. She has no wheezes. She has no rales. She exhibits no tenderness.  GI: Soft. Bowel sounds are normal. She exhibits no distension and no mass. There is tenderness. There is no rebound and no guarding.  Genitourinary:       Vulva is normal without lesions Vagina is pink moist without discharge Cervix normal in appearance and pap is normal Uterus is normal size, contour, position, consistency, mobility, non-tender Adnexa is per sonogram Musculoskeletal: Normal range of motion. She exhibits no edema and  no tenderness.  Neurological: She is alert and oriented to person, place, and time. She has normal reflexes. She displays normal reflexes. No cranial nerve deficit. She exhibits normal muscle tone. Coordination normal.  Skin: Skin is warm and dry. No rash noted. No erythema. No pallor.  Psychiatric: She has a normal mood and affect. Her behavior is normal. Judgment and thought content normal.    Labs: Results for orders placed or performed during the hospital encounter of 03/19/17 (from the past 336 hour(s))  Lipase, blood   Collection Time: 03/19/17  9:28 AM  Result Value Ref Range   Lipase 18 11 - 51 U/L  Comprehensive metabolic panel   Collection Time: 03/19/17  9:28 AM  Result Value Ref Range   Sodium 138 135 - 145 mmol/L   Potassium 4.1 3.5 - 5.1 mmol/L   Chloride 104 101 - 111 mmol/L   CO2 27 22 - 32  mmol/L   Glucose, Bld 115 (H) 65 - 99 mg/dL   BUN 9 6 - 20 mg/dL   Creatinine, Ser 0.76 0.44 - 1.00 mg/dL   Calcium 9.8 8.9 - 10.3 mg/dL   Total Protein 7.5 6.5 - 8.1 g/dL   Albumin 3.9 3.5 - 5.0 g/dL   AST 35 15 - 41 U/L   ALT 32 14 - 54 U/L   Alkaline Phosphatase 45 38 - 126 U/L   Total Bilirubin 0.5 0.3 - 1.2 mg/dL   GFR calc non Af Amer >60 >60 mL/min   GFR calc Af Amer >60 >60 mL/min   Anion gap 7 5 - 15  Urinalysis, Routine w reflex microscopic   Collection Time: 03/19/17  9:28 AM  Result Value Ref Range   Color, Urine STRAW (A) YELLOW   APPearance CLEAR CLEAR   Specific Gravity, Urine 1.006 1.005 - 1.030   pH 7.0 5.0 - 8.0   Glucose, UA NEGATIVE NEGATIVE mg/dL   Hgb urine dipstick NEGATIVE NEGATIVE   Bilirubin Urine NEGATIVE NEGATIVE   Ketones, ur NEGATIVE NEGATIVE mg/dL   Protein, ur NEGATIVE NEGATIVE mg/dL   Nitrite NEGATIVE NEGATIVE   Leukocytes, UA NEGATIVE NEGATIVE  CBC with Differential   Collection Time: 03/19/17  9:31 AM  Result Value Ref Range   WBC 5.1 4.0 - 10.5 K/uL   RBC 4.10 3.87 - 5.11 MIL/uL   Hemoglobin 12.6 12.0 - 15.0 g/dL   HCT 37.6  36.0 - 46.0 %   MCV 91.7 78.0 - 100.0 fL   MCH 30.7 26.0 - 34.0 pg   MCHC 33.5 30.0 - 36.0 g/dL   RDW 13.4 11.5 - 15.5 %   Platelets 273 150 - 400 K/uL   Neutrophils Relative % 40 %   Neutro Abs 2.1 1.7 - 7.7 K/uL   Lymphocytes Relative 48 %   Lymphs Abs 2.4 0.7 - 4.0 K/uL   Monocytes Relative 7 %   Monocytes Absolute 0.4 0.1 - 1.0 K/uL   Eosinophils Relative 5 %   Eosinophils Absolute 0.3 0.0 - 0.7 K/uL   Basophils Relative 0 %   Basophils Absolute 0.0 0.0 - 0.1 K/uL    EKG: Orders placed or performed in visit on 03/06/17  . EKG 12-Lead    Imaging Studies: US Transvaginal Non-ob  Result Date: 03/19/2017 CLINICAL DATA:  Pelvic pain for 6 months. EXAM: TRANSABDOMINAL AND TRANSVAGINAL ULTRASOUND OF PELVIS TECHNIQUE: Both transabdominal and transvaginal ultrasound examinations of the pelvis were performed. Transabdominal technique was performed for global imaging of the pelvis including uterus, ovaries, adnexal regions, and pelvic cul-de-sac. It was necessary to proceed with endovaginal exam following the transabdominal exam to visualize the uterus and ovaries. COMPARISON:  CT 03/19/2017. FINDINGS: Uterus Measurements: 7.4 x 2.9 x 5.1 cm . No fibroids or other mass visualized. Endometrium Thickness: 2.1 mm.  No focal abnormality visualized. Right ovary Measurements: 2.0 x 1.2 x 2.2 cm. Normal appearance/no adnexal mass. Left ovary Not visualized Other findings 9.2 x 4.4 x 4.5 cm complex mass noted in the pelvis posterior to the a uterus as noted on prior CT. This mass appears to contain calcification. Again a process such as a dermoid or teratoma is favored. Other etiologies including ovarian malignancy cannot be excluded. Gynecologic evaluation suggested. IMPRESSION: 9.2 x 4.4 x 4.5 cm complex mass noted the pelvis posterior to the uterus as noted on prior CT. This mass appears to contain calcification. Again a process such as a dermoid or teratoma is favored. Gynecologic  evaluation  suggested. Electronically Signed   By: Marcello Moores  Register   On: 03/19/2017 12:25   US Pelvis Complete  Result Date: 03/19/2017 CLINICAL DATA:  Pelvic pain for 6 months. EXAM: TRANSABDOMINAL AND TRANSVAGINAL ULTRASOUND OF PELVIS TECHNIQUE: Both transabdominal and transvaginal ultrasound examinations of the pelvis were performed. Transabdominal technique was performed for global imaging of the pelvis including uterus, ovaries, adnexal regions, and pelvic cul-de-sac. It was necessary to proceed with endovaginal exam following the transabdominal exam to visualize the uterus and ovaries. COMPARISON:  CT 03/19/2017. FINDINGS: Uterus Measurements: 7.4 x 2.9 x 5.1 cm . No fibroids or other mass visualized. Endometrium Thickness: 2.1 mm.  No focal abnormality visualized. Right ovary Measurements: 2.0 x 1.2 x 2.2 cm. Normal appearance/no adnexal mass. Left ovary Not visualized Other findings 9.2 x 4.4 x 4.5 cm complex mass noted in the pelvis posterior to the a uterus as noted on prior CT. This mass appears to contain calcification. Again a process such as a dermoid or teratoma is favored. Other etiologies including ovarian malignancy cannot be excluded. Gynecologic evaluation suggested. IMPRESSION: 9.2 x 4.4 x 4.5 cm complex mass noted the pelvis posterior to the uterus as noted on prior CT. This mass appears to contain calcification. Again a process such as a dermoid or teratoma is favored. Gynecologic evaluation suggested. Electronically Signed   By: Marcello Moores  Register   On: 03/19/2017 12:25   Ct Abdomen Pelvis W Contrast  Result Date: 03/19/2017 CLINICAL DATA:  Severe nausea.  Right lower quadrant pain. EXAM: CT ABDOMEN AND PELVIS WITH CONTRAST TECHNIQUE: Multidetector CT imaging of the abdomen and pelvis was performed using the standard protocol following bolus administration of intravenous contrast. CONTRAST:  160mL ISOVUE-300 IOPAMIDOL (ISOVUE-300) INJECTION 61% COMPARISON:  None. FINDINGS: Lower chest: No acute  finding. Nodular density along the lateral lower right breast was also seen on mammography from 2014. Hepatobiliary: No focal liver abnormality.No evidence of biliary obstruction or stone. Pancreas: Unremarkable. Spleen: Unremarkable. Adrenals/Urinary Tract: Negative adrenals. No hydronephrosis or stone. Unremarkable bladder. Stomach/Bowel: A few loops of small bowel have fecalized contents, but no transition point or bowel wall thickening. No appendicitis. Vascular/Lymphatic: No acute vascular abnormality. No mass or adenopathy. Reproductive:Cystic mass in the rectovaginal recess, right eccentric, measuring up to 8 cm. The mass has a fluid fluid level, which may include lipid superiorly. Small mural calcification is noted. No evidence of neighboring ovarian edema. Other: No ascites or pneumoperitoneum. Musculoskeletal: No acute abnormalities. Prominent facet arthropathy, especially of the lumbar spine. Prominent thoracic spondylosis. Advanced diffuse facet arthropathy IMPRESSION: 1. 8 cm cystic mass in the rectovaginal recess with fluid fluid level, possibly containing lipid. Dermoid is favored, recommend pelvic ultrasound follow-up. 2. A few bowel loops contain fecalized material as seen with slow transit, but no transition point or inflammatory wall thickening. 3. No appendicitis. Electronically Signed   By: Monte Fantasia M.D.   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 RLQ abdominal pain 03/19/2017  . Nausea 03/19/2017  . Welcome to Medicare preventive visit 03/06/2017  . Prediabetes 03/30/2015  . Back pain with radiation 07/13/2012  . Hyperlipemia 06/22/2008  . Allergic rhinitis 01/08/2008  . OVERACTIVE BLADDER 04/10/2007  . GAD (generalized anxiety disorder) 08/22/2006  . Adjustment reaction with anxiety and depression 08/22/2006  . OSTEOARTHRITIS 08/22/2006    Plan: Laparoscopic right  salpingo oophorectomy with removal of dermoid cyst 04/09/2017  EURE,LUTHER H 03/21/2017 12:22 PM      Face to face time:  30 minutes  Greater than 50% of the visit time was spent in counseling and coordination of care with the patient.  The summary and outline of the counseling and care coordination is summarized in the note above.   All questions were answered.

## 2017-03-25 ENCOUNTER — Telehealth: Payer: Self-pay | Admitting: Obstetrics & Gynecology

## 2017-03-26 ENCOUNTER — Other Ambulatory Visit: Payer: Self-pay | Admitting: Obstetrics & Gynecology

## 2017-03-26 MED ORDER — HYDROCODONE-ACETAMINOPHEN 5-325 MG PO TABS: ORAL_TABLET | ORAL | 0 refills | Status: DC

## 2017-03-26 NOTE — Telephone Encounter (Signed)
Informed patient prescription was sent to pharmacy. 

## 2017-03-27 ENCOUNTER — Ambulatory Visit (HOSPITAL_COMMUNITY): Payer: Self-pay

## 2017-04-01 NOTE — Patient Instructions (Signed)
[name]  04/01/2017     @PREFPERIOPPHARMACY @   Your procedure is scheduled on  04/09/2017   Report to [facility] at  700  A.M.  Call this number if you have problems the morning of surgery:  [phone]   Remember:  Do not eat food or drink liquids after midnight.  Take these medicines the morning of surgery with A SIP OF WATER  Celexa, neurontin, hydrocodone, claritin, ditropan.   Do not wear jewelry, make-up or nail polish.  Do not wear lotions, powders, or perfumes, or deoderant.  Do not shave 48 hours prior to surgery.  Men may shave face and neck.  Do not bring valuables to the hospital.  [facility] is not responsible for any belongings or valuables.  Contacts, dentures or bridgework may not be worn into surgery.  Leave your suitcase in the car.  After surgery it may be brought to your room.  For patients admitted to the hospital, discharge time will be determined by your treatment team.  Patients discharged the day of surgery will not be allowed to drive home.   Name and phone number of your driver:   family Special instructions:  None  Please read over the following fact sheets that you were given. Anesthesia Post-op Instructions and Care and Recovery After Surgery       Unilateral Salpingo-Oophorectomy Unilateral salpingo-oophorectomy is the surgical removal of one fallopian tube and ovary. The ovaries are small organs that produce eggs in women. The fallopian tubes transport the egg from the ovary to the womb (uterus). A unilateral salpingo-oophorectomy may be done for various reasons, including:  Infection in the fallopian tube and ovary.  Scar tissue in the fallopian tube and ovary (adhesions).  A cyst or tumor on the ovary.  A need to remove the fallopian tube and ovary when removing the uterus.  Cancer of the fallopian tube or ovary.  The removal of one fallopian tube and ovary will not prevent you from becoming pregnant, put you  into menopause, or cause problems with your menstrual periods or sex drive. Tell a health care provider about:  Any allergies you have.  All medicines you are taking, including vitamins, herbs, eye drops, creams, and over-the-counter medicines.  Previous problems you or family members have had with the use of anesthetics.  Any blood disorders you have.  Previous surgeries you have had.  Any medical conditions you have. What are the risks? Generally, this is a safe procedure. However, as with any procedure, complications can occur. Possible complications include:  Injury to surrounding organs.  Bleeding.  Infection.  Blood clots in the legs or lungs.  Problems related to anesthesia.  What happens before the procedure?  Ask your health care provider about changing or stopping your regular medicines. You may need to stop taking certain medicines, such as aspirin or blood thinners, at least 1 week before the surgery.  Do not eat or drink anything for at least 8 hours before the surgery.  If you smoke, do not smoke for at least 2 weeks before the surgery.  Make plans to have someone drive you home after the procedure or after your hospital stay. Also arrange for someone to help you with activities during recovery. What happens during the procedure?  You will be given medicine to help you relax before the procedure (sedative). You will then be given medicine to make you sleep through the procedure (general  anesthetic). These medicines will be given through an IV access tube that is put into one of your veins.  Once you are asleep, your lower abdomen will be shaved and cleaned. A thin, flexible tube (catheter) will be placed in your bladder.  The surgeon may use a laparoscopic, robotic, or open technique for this surgery: ? In the laparoscopic technique, the surgery is done through two small cuts (incisions) in the abdomen. A thin, lighted tube with a tiny camera on the end  (laparoscope) is inserted into one of the incisions. The tools needed for the procedure are put through the other incision. ? A robotic technique may be chosen to perform complex surgery in a small space. In the robotic technique, small incisions are made. A camera and surgical instruments are passed through the incisions. Surgical instruments are controlled with the help of a robotic arm. ? In the open technique, the surgery is done through one large incision in the abdomen.  Using any of these techniques, the surgeon will remove the fallopian tube and ovary. The blood vessels will be clamped and tied.  The surgeon will then use staples or stitches to close the incision or incisions. What happens after the procedure?  You will be taken to a recovery area where your progress will be monitored for 1-3 hours. Your blood pressure, pulse, and temperature will be checked often. You will remain in the recovery area until you are stable and waking up.  If the laparoscopic technique was used, you may be allowed to go home after several hours. You may have some shoulder pain. This is normal and usually goes away in a day or two.  If the open technique was used, you will be admitted to the hospital for a couple of days.  You will be given pain medicine as necessary.  The IV tube and catheter will be removed before you are discharged. This information is not intended to replace advice given to you by your health care provider. Make sure you discuss any questions you have with your health care provider. Document Released: 08/11/2009 Document Revised: 09/13/2016 Document Reviewed: 04/07/2013 Elsevier Interactive Patient Education  2018 [place]. Unilateral Salpingo-Oophorectomy, Care After Refer to this sheet in the next few weeks. These instructions provide you with information on caring for yourself after your procedure. Your health care provider may also give you more specific instructions. Your  treatment has been planned according to current medical practices, but problems sometimes occur. Call your health care provider if you have any problems or questions after your procedure. What can I expect after the procedure? After the procedure, it is typical to have the following:  Abdominal pain that can be controlled with pain medicine.  Vaginal spotting.  Constipation.  Follow these instructions at home:  Get plenty of rest and sleep.  Only take over-the-counter or prescription medicines as directed by your health care provider. Do not take aspirin. It can cause bleeding.  Keep incision areas clean and dry. Remove or change any bandages (dressings) only as directed by your health care provider.  Follow your health care provider's advice regarding diet.  Drink enough fluids to keep your urine clear or pale yellow.  Limit exercise and activities as directed by your health care provider. Do not lift anything heavier than 5 pounds (2.3 kg) until your health care provider approves.  Do not drive until your health care provider approves.  Do not drink alcohol until your health care provider approves.  Do  not have sexual intercourse until your health care provider says it is OK.  Take your temperature twice a day and write it down.  If you become constipated, you may: ? Ask your health care provider about taking a mild laxative. ? Add more fruit and bran to your diet. ? Drink more fluids.  Follow up with your health care provider as directed. Contact a health care provider if:  You have swelling or redness in the incision area.  You develop a rash.  You feel lightheaded.  You have pain that is not controlled with medicine.  You have pain, swelling, or redness where the IV access tube was placed. Get help right away if:  You have a fever.  You develop increasing abdominal pain.  You see pus coming out of the incision, or the incision is separating.  You notice a  bad smell coming from the wound or dressing.  You have excessive vaginal bleeding.  You feel sick to your stomach (nauseous) and vomit.  You have leg or chest pain.  You have pain when you urinate.  You develop shortness of breath.  You pass out. This information is not intended to replace advice given to you by your health care provider. Make sure you discuss any questions you have with your health care provider. Document Released: 08/10/2009 Document Revised: 09/13/2016 Document Reviewed: 04/07/2013 Elsevier Interactive Patient Education  2018 [place] Anesthesia, Adult General anesthesia is the use of medicines to make a person "go to sleep" (be unconscious) for a medical procedure. General anesthesia is often recommended when a procedure:  Is long.  Requires you to be still or in an unusual position.  Is major and can cause you to lose blood.  Is impossible to do without general anesthesia.  The medicines used for general anesthesia are called general anesthetics. In addition to making you sleep, the medicines:  Prevent pain.  Control your blood pressure.  Relax your muscles.  Tell a health care provider about:  Any allergies you have.  All medicines you are taking, including vitamins, herbs, eye drops, creams, and over-the-counter medicines.  Any problems you or family members have had with anesthetic medicines.  Types of anesthetics you have had in the past.  Any bleeding disorders you have.  Any surgeries you have had.  Any medical conditions you have.  Any history of heart or lung conditions, such as heart failure, sleep apnea, or chronic obstructive pulmonary disease (COPD).  Whether you are pregnant or may be pregnant.  Whether you use tobacco, alcohol, marijuana, or street drugs.  Any history of Armed forces logistics/support/administrative officer.  Any history of depression or anxiety. What are the risks? Generally, this is a safe procedure. However, problems may  occur, including:  Allergic reaction to anesthetics.  Lung and heart problems.  Inhaling food or liquids from your stomach into your lungs (aspiration).  Injury to nerves.  Waking up during your procedure and being unable to move (rare).  Extreme agitation or a state of mental confusion (delirium) when you wake up from the anesthetic.  Air in the bloodstream, which can lead to stroke.  These problems are more likely to develop if you are having a major surgery or if you have an advanced medical condition. You can prevent some of these complications by answering all of your health care provider's questions thoroughly and by following all pre-procedure instructions. General anesthesia can cause side effects, including:  Nausea or vomiting  A sore throat from  the breathing tube.  Feeling cold or shivery.  Feeling tired, washed out, or achy.  Sleepiness or drowsiness.  Confusion or agitation.  What happens before the procedure? Staying hydrated Follow instructions from your health care provider about hydration, which may include:  Up to 2 hours before the procedure - you may continue to drink clear liquids, such as water, clear fruit juice, black coffee, and plain tea.  Eating and drinking restrictions Follow instructions from your health care provider about eating and drinking, which may include:  8 hours before the procedure - stop eating heavy meals or foods such as meat, fried foods, or fatty foods.  6 hours before the procedure - stop eating light meals or foods, such as toast or cereal.  6 hours before the procedure - stop drinking milk or drinks that contain milk.  2 hours before the procedure - stop drinking clear liquids.  Medicines  Ask your health care provider about: ? Changing or stopping your regular medicines. This is especially important if you are taking diabetes medicines or blood thinners. ? Taking medicines such as aspirin and ibuprofen. These  medicines can thin your blood. Do not take these medicines before your procedure if your health care provider instructs you not to. ? Taking new dietary supplements or medicines. Do not take these during the week before your procedure unless your health care provider approves them.  If you are told to take a medicine or to continue taking a medicine on the day of the procedure, take the medicine with sips of water. General instructions   Ask if you will be going home the same day, the following day, or after a longer hospital stay. ? Plan to have someone take you home. ? Plan to have someone stay with you for the first 24 hours after you leave the hospital or clinic.  For 3-6 weeks before the procedure, try not to use any tobacco products, such as cigarettes, chewing tobacco, and e-cigarettes.  You may brush your teeth on the morning of the procedure, but make sure to spit out the toothpaste. What happens during the procedure?  You will be given anesthetics through a mask and through an IV tube in one of your veins.  You may receive medicine to help you relax (sedative).  As soon as you are asleep, a breathing tube may be used to help you breathe.  An anesthesia specialist will stay with you throughout the procedure. He or she will help keep you comfortable and safe by continuing to give you medicines and adjusting the amount of medicine that you get. He or she will also watch your blood pressure, pulse, and oxygen levels to make sure that the anesthetics do not cause any problems.  If a breathing tube was used to help you breathe, it will be removed before you wake up. The procedure may vary among health care providers and hospitals. What happens after the procedure?  You will wake up, often slowly, after the procedure is complete, usually in a recovery area.  Your blood pressure, heart rate, breathing rate, and blood oxygen level will be monitored until the medicines you were given  have worn off.  You may be given medicine to help you calm down if you feel anxious or agitated.  If you will be going home the same day, your health care provider may check to make sure you can stand, drink, and urinate.  Your health care providers will treat your pain and side effects  before you go home.  Do not drive for 24 hours if you received a sedative.  You may: ? Feel nauseous and vomit. ? Have a sore throat. ? Have mental slowness. ? Feel cold or shivery. ? Feel sleepy. ? Feel tired. ? Feel sore or achy, even in parts of your body where you did not have surgery. This information is not intended to replace advice given to you by your health care provider. Make sure you discuss any questions you have with your health care provider. Document Released: 01/21/2008 Document Revised: 03/26/2016 Document Reviewed: 09/28/2015 Elsevier Interactive Patient Education  2018 [place] Anesthesia, Adult, Care After These instructions provide you with information about caring for yourself after your procedure. Your health care provider may also give you more specific instructions. Your treatment has been planned according to current medical practices, but problems sometimes occur. Call your health care provider if you have any problems or questions after your procedure. What can I expect after the procedure? After the procedure, it is common to have:  Vomiting.  A sore throat.  Mental slowness.  It is common to feel:  Nauseous.  Cold or shivery.  Sleepy.  Tired.  Sore or achy, even in parts of your body where you did not have surgery.  Follow these instructions at home: For at least 24 hours after the procedure:  Do not: ? Participate in activities where you could fall or become injured. ? Drive. ? Use heavy machinery. ? Drink alcohol. ? Take sleeping pills or medicines that cause drowsiness. ? Make important decisions or sign legal documents. ? Take care  of children on your own.  Rest. Eating and drinking  If you vomit, drink water, juice, or soup when you can drink without vomiting.  Drink enough fluid to keep your urine clear or pale yellow.  Make sure you have little or no nausea before eating solid foods.  Follow the diet recommended by your health care provider. General instructions  Have a responsible adult stay with you until you are awake and alert.  Return to your normal activities as told by your health care provider. Ask your health care provider what activities are safe for you.  Take over-the-counter and prescription medicines only as told by your health care provider.  If you smoke, do not smoke without supervision.  Keep all follow-up visits as told by your health care provider. This is important. Contact a health care provider if:  You continue to have nausea or vomiting at home, and medicines are not helpful.  You cannot drink fluids or start eating again.  You cannot urinate after 8-12 hours.  You develop a skin rash.  You have fever.  You have increasing redness at the site of your procedure. Get help right away if:  You have difficulty breathing.  You have chest pain.  You have unexpected bleeding.  You feel that you are having a life-threatening or urgent problem. This information is not intended to replace advice given to you by your health care provider. Make sure you discuss any questions you have with your health care provider. Document Released: 01/20/2001 Document Revised: 03/18/2016 Document Reviewed: 09/28/2015 Elsevier Interactive Patient Education  [employer].

## 2017-04-03 ENCOUNTER — Other Ambulatory Visit (HOSPITAL_COMMUNITY): Payer: Medicare Other

## 2017-04-04 ENCOUNTER — Encounter (HOSPITAL_COMMUNITY)
Admission: RE | Admit: 2017-04-04 | Discharge: 2017-04-04 | Disposition: A | Discharge status: Home / Self Care | Payer: Medicare Other | Source: Ambulatory Visit | Attending: Obstetrics & Gynecology | Admitting: Obstetrics & Gynecology

## 2017-04-04 ENCOUNTER — Encounter (HOSPITAL_COMMUNITY): Payer: Self-pay

## 2017-04-04 DIAGNOSIS — Z01818 Encounter for other preprocedural examination: Secondary | ICD-10-CM | POA: Insufficient documentation

## 2017-04-04 LAB — CBC
HCT: 34.8 % — ABNORMAL LOW (ref 36.0–46.0)
HEMOGLOBIN: 11.7 g/dL — AB (ref 12.0–15.0)
MCH: 30.6 pg (ref 26.0–34.0)
MCHC: 33.6 g/dL (ref 30.0–36.0)
MCV: 91.1 fL (ref 78.0–100.0)
Platelets: 251 10*3/uL (ref 150–400)
RBC: 3.82 MIL/uL — ABNORMAL LOW (ref 3.87–5.11)
RDW: 13.5 % (ref 11.5–15.5)
WBC: 5.6 10*3/uL (ref 4.0–10.5)

## 2017-04-04 LAB — TYPE AND SCREEN
ABO/RH(D): A POS
Antibody Screen: NEGATIVE

## 2017-04-04 LAB — COMPREHENSIVE METABOLIC PANEL
ALK PHOS: 41 U/L (ref 38–126)
ALT: 34 U/L (ref 14–54)
ANION GAP: 6 (ref 5–15)
AST: 35 U/L (ref 15–41)
Albumin: 3.8 g/dL (ref 3.5–5.0)
BILIRUBIN TOTAL: 0.6 mg/dL (ref 0.3–1.2)
BUN: 12 mg/dL (ref 6–20)
CALCIUM: 9.4 mg/dL (ref 8.9–10.3)
CO2: 26 mmol/L (ref 22–32)
Chloride: 102 mmol/L (ref 101–111)
Creatinine, Ser: 0.9 mg/dL (ref 0.44–1.00)
GFR calc non Af Amer: >60 mL/min (ref >60)
Glucose, Bld: 118 mg/dL — ABNORMAL HIGH (ref 65–99)
Potassium: 4 mmol/L (ref 3.5–5.1)
SODIUM: 134 mmol/L — AB (ref 135–145)
Total Protein: 7.4 g/dL (ref 6.5–8.1)

## 2017-04-04 LAB — URINALYSIS, ROUTINE W REFLEX MICROSCOPIC
Bilirubin Urine: NEGATIVE
GLUCOSE, UA: NEGATIVE mg/dL
Hgb urine dipstick: NEGATIVE
Ketones, ur: NEGATIVE mg/dL
LEUKOCYTES UA: NEGATIVE
Nitrite: NEGATIVE
Protein, ur: NEGATIVE mg/dL
Specific Gravity, Urine: 1.006 (ref 1.005–1.030)
pH: 6 (ref 5.0–8.0)

## 2017-04-04 LAB — RAPID HIV SCREEN (HIV 1/2 AB+AG)
HIV 1/2 Antibodies: NONREACTIVE
HIV-1 P24 ANTIGEN - HIV24: NONREACTIVE

## 2017-04-08 NOTE — H&P (Signed)
Preoperative History and Physical  [name] is a 66 y.o. G3P3003 with No LMP recorded. Patient has had a hysterectomy. 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 Not her left tube and ovary, or her uterus, only wants the problem removed    PMH:        Past Medical History:  Diagnosis Date  . Allergy   . Anxiety   . GERD (gastroesophageal reflux disease)   . Hyperlipidemia   . Osteoarthritis     PSH:          Past Surgical History:  Procedure Laterality Date  . cyst removed from right breast    . PARTIAL HYSTERECTOMY    . TUBAL LIGATION      POb/GynH:              OB History    Gravida Para Term Preterm AB Living   3 3 3     3    SAB TAB Ectopic Multiple Live Births           3      SH:          Social History   Substance Use Topics   . Smoking status: Never Smoker   . Smokeless tobacco: Never Used   . Alcohol use Yes     Comment: occasionally wine     FH:         Family History  Problem Relation Age of Onset  . Heart disease Mother   . Heart disease Father   . Diabetes Father   . Hypertension Sister   . Parkinson's disease Brother   . Heart disease Maternal Grandmother   . Cancer Paternal Grandmother        breast  . Diabetes Paternal Grandfather   . Hypertension Sister   . Hypertension Sister   . Down syndrome Son   . Eczema Son   . Post-traumatic stress disorder Daughter      Allergies:       Allergies  Allergen Reactions  . Ibuprofen Other (See Comments)    Rapid heart rate.    Medications:       Current Outpatient Prescriptions:  .  aspirin 81 MG tablet, Take 81 mg by mouth daily., Disp: , Rfl:  .  citalopram (CELEXA) 40 MG tablet, TAKE ONE TABLET BY MOUTH ONCE DAILY., Disp: 90 tablet, Rfl: 1 .  dicyclomine (BENTYL) 20 MG tablet,  Take 1 tablet (20 mg total) by mouth every 6 (six) hours as needed for spasms (abdominal cramping)., Disp: 15 tablet, Rfl: 0 .  gabapentin (NEURONTIN) 300 MG capsule, TAKE (1) CAPSULE BY MOUTH TWICE DAILY. (Patient taking differently: TAKE (1) CAPSULE BY MOUTH PRN), Disp: 180 capsule, Rfl: 1 .  HYDROcodone-acetaminophen (NORCO/VICODIN) 5-325 MG tablet, 1 or 2 tabs PO q6 hours prn pain, Disp: 12 tablet, Rfl: 0 .  loratadine (CLARITIN) 10 MG tablet, TAKE (1) TABLET BY MOUTH ONCE DAILY AS NEEDED FOR ALLERGIES., Disp: 90 tablet, Rfl: 1 .  Multiple Vitamins-Minerals (MULTIVITAMIN PO), Take 1 tablet by mouth daily. , Disp: , Rfl:  .  Omega-3 Fatty Acids (FISH OIL) 1000 MG CAPS, TAKE 2 CAPSULES (2 G TOTAL) BY MOUTH DAILY., Disp: 90 each, Rfl: 11 .  oxybutynin (DITROPAN) 5 MG tablet, TAKE (1) TABLET BY MOUTH TWICE DAILY. (Patient taking differently: Take 5 mg by mouth  daily as needed for bladder spasms. TAKE (1) TABLET BY MOUTH TWICE DAILY.), Disp: 180 tablet, Rfl: 1 .  pravastatin (PRAVACHOL) 80 MG tablet, TAKE ONE TABLET BY MOUTH ONCE DAILY., Disp: 90 tablet, Rfl: 1 .  temazepam (RESTORIL) 15 MG capsule, TAKE 1 CAPSULE AT BEDTIME AS NEEDED FOR SLEEP., Disp: 30 capsule, Rfl: 2  Review of Systems:   Review of Systems  Constitutional: Negative for fever, chills, weight loss, malaise/fatigue and diaphoresis.  HENT: Negative for hearing loss, ear pain, nosebleeds, congestion, sore throat, neck pain, tinnitus and ear discharge.   Eyes: Negative for blurred vision, double vision, photophobia, pain, discharge and redness.  Respiratory: Negative for cough, hemoptysis, sputum production, shortness of breath, wheezing and stridor.   Cardiovascular: Negative for chest pain, palpitations, orthopnea, claudication, leg swelling and PND.  Gastrointestinal: Positive for abdominal pain. Negative for heartburn, nausea, vomiting, diarrhea, constipation, blood in stool and melena.  Genitourinary: Negative for dysuria,  urgency, frequency, hematuria and flank pain.  Musculoskeletal: Negative for myalgias, back pain, joint pain and falls.  Skin: Negative for itching and rash.  Neurological: Negative for dizziness, tingling, tremors, sensory change, speech change, focal weakness, seizures, loss of consciousness, weakness and headaches.  Endo/Heme/Allergies: Negative for environmental allergies and polydipsia. Does not bruise/bleed easily.  Psychiatric/Behavioral: Negative for depression, suicidal ideas, hallucinations, memory loss and substance abuse. The patient is not nervous/anxious and does not have insomnia.      PHYSICAL EXAM:  Blood pressure (!) 146/70, pulse 86, height 5\' 3"  (1.6 m), weight 162 lb (73.5 kg).    Vitals reviewed. Constitutional: She is oriented to person, place, and time. She appears well-developed and well-nourished.  HENT:  Head: Normocephalic and atraumatic.  Right Ear: External ear normal.  Left Ear: External ear normal.  Nose: Nose normal.  Mouth/Throat: Oropharynx is clear and moist.  Eyes: Conjunctivae and EOM are normal. Pupils are equal, round, and reactive to light. Right eye exhibits no discharge. Left eye exhibits no discharge. No scleral icterus.  Neck: Normal range of motion. Neck supple. No tracheal deviation present. No thyromegaly present.  Cardiovascular: Normal rate, regular rhythm, normal heart sounds and intact distal pulses.  Exam reveals no gallop and no friction rub.   No murmur heard. Respiratory: Effort normal and breath sounds normal. No respiratory distress. She has no wheezes. She has no rales. She exhibits no tenderness.  GI: Soft. Bowel sounds are normal. She exhibits no distension and no mass. There is tenderness. There is no rebound and no guarding.  Genitourinary:       Vulva is normal without lesions Vagina is pink moist without discharge Cervix normal in appearance and pap is normal Uterus is normal size, contour, position, consistency,  mobility, non-tender Adnexa is per sonogram Musculoskeletal: Normal range of motion. She exhibits no edema and no tenderness.  Neurological: She is alert and oriented to person, place, and time. She has normal reflexes. She displays normal reflexes. No cranial nerve deficit. She exhibits normal muscle tone. Coordination normal.  Skin: Skin is warm and dry. No rash noted. No erythema. No pallor.  Psychiatric: She has a normal mood and affect. Her behavior is normal. Judgment and thought content normal.    Labs: Results for orders placed or performed during the hospital encounter of 04/04/17 (from the past 168 hour(s))  CBC   Collection Time: 04/04/17  2:13 PM  Result Value Ref Range   WBC 5.6 4.0 - 10.5 K/uL   RBC 3.82 (L) 3.87 - 5.11 MIL/uL  Hemoglobin 11.7 (L) 12.0 - 15.0 g/dL   HCT 34.8 (L) 36.0 - 46.0 %   MCV 91.1 78.0 - 100.0 fL   MCH 30.6 26.0 - 34.0 pg   MCHC 33.6 30.0 - 36.0 g/dL   RDW 13.5 11.5 - 15.5 %   Platelets 251 150 - 400 K/uL  Comprehensive metabolic panel   Collection Time: 04/04/17  2:13 PM  Result Value Ref Range   Sodium 134 (L) 135 - 145 mmol/L   Potassium 4.0 3.5 - 5.1 mmol/L   Chloride 102 101 - 111 mmol/L   CO2 26 22 - 32 mmol/L   Glucose, Bld 118 (H) 65 - 99 mg/dL   BUN 12 6 - 20 mg/dL   Creatinine, Ser 0.90 0.44 - 1.00 mg/dL   Calcium 9.4 8.9 - 10.3 mg/dL   Total Protein 7.4 6.5 - 8.1 g/dL   Albumin 3.8 3.5 - 5.0 g/dL   AST 35 15 - 41 U/L   ALT 34 14 - 54 U/L   Alkaline Phosphatase 41 38 - 126 U/L   Total Bilirubin 0.6 0.3 - 1.2 mg/dL   GFR calc non Af Amer >60 >60 mL/min   GFR calc Af Amer >60 >60 mL/min   Anion gap 6 5 - 15  Rapid HIV screen (HIV 1/2 Ab+Ag)   Collection Time: 04/04/17  2:13 PM  Result Value Ref Range   HIV-1 P24 Antigen - HIV24 NON REACTIVE NON REACTIVE   HIV 1/2 Antibodies NON REACTIVE NON REACTIVE   Interpretation (HIV Ag Ab)      A non reactive test result means that HIV 1 or HIV 2 antibodies and HIV 1 p24 antigen were  not detected in the specimen.  Urinalysis, Routine w reflex microscopic   Collection Time: 04/04/17  2:13 PM  Result Value Ref Range   Color, Urine STRAW (A) YELLOW   APPearance CLEAR CLEAR   Specific Gravity, Urine 1.006 1.005 - 1.030   pH 6.0 5.0 - 8.0   Glucose, UA NEGATIVE NEGATIVE mg/dL   Hgb urine dipstick NEGATIVE NEGATIVE   Bilirubin Urine NEGATIVE NEGATIVE   Ketones, ur NEGATIVE NEGATIVE mg/dL   Protein, ur NEGATIVE NEGATIVE mg/dL   Nitrite NEGATIVE NEGATIVE   Leukocytes, UA NEGATIVE NEGATIVE  Type and screen   Collection Time: 04/04/17  2:13 PM  Result Value Ref Range   ABO/RH(D) A POS    Antibody Screen NEG    Sample Expiration 04/07/2017    Extend sample reason NO TRANSFUSIONS OR PREGNANCY IN THE PAST 3 MONTHS          Results for orders placed or performed during the hospital encounter of 03/19/17 (from the past 336 hour(s))  Lipase, blood   Collection Time: 03/19/17  9:28 AM  Result Value Ref Range   Lipase 18 11 - 51 U/L  Comprehensive metabolic panel   Collection Time: 03/19/17  9:28 AM  Result Value Ref Range   Sodium 138 135 - 145 mmol/L   Potassium 4.1 3.5 - 5.1 mmol/L   Chloride 104 101 - 111 mmol/L   CO2 27 22 - 32 mmol/L   Glucose, Bld 115 (H) 65 - 99 mg/dL   BUN 9 6 - 20 mg/dL   Creatinine, Ser 0.76 0.44 - 1.00 mg/dL   Calcium 9.8 8.9 - 10.3 mg/dL   Total Protein 7.5 6.5 - 8.1 g/dL   Albumin 3.9 3.5 - 5.0 g/dL   AST 35 15 - 41 U/L   ALT 32 14 -  54 U/L   Alkaline Phosphatase 45 38 - 126 U/L   Total Bilirubin 0.5 0.3 - 1.2 mg/dL   GFR calc non Af Amer >60 >60 mL/min   GFR calc Af Amer >60 >60 mL/min   Anion gap 7 5 - 15  Urinalysis, Routine w reflex microscopic   Collection Time: 03/19/17  9:28 AM  Result Value Ref Range   Color, Urine STRAW (A) YELLOW   APPearance CLEAR CLEAR   Specific Gravity, Urine 1.006 1.005 - 1.030   pH 7.0 5.0 - 8.0   Glucose, UA NEGATIVE NEGATIVE mg/dL   Hgb urine dipstick NEGATIVE  NEGATIVE   Bilirubin Urine NEGATIVE NEGATIVE   Ketones, ur NEGATIVE NEGATIVE mg/dL   Protein, ur NEGATIVE NEGATIVE mg/dL   Nitrite NEGATIVE NEGATIVE   Leukocytes, UA NEGATIVE NEGATIVE  CBC with Differential   Collection Time: 03/19/17  9:31 AM  Result Value Ref Range   WBC 5.1 4.0 - 10.5 K/uL   RBC 4.10 3.87 - 5.11 MIL/uL   Hemoglobin 12.6 12.0 - 15.0 g/dL   HCT 37.6 36.0 - 46.0 %   MCV 91.7 78.0 - 100.0 fL   MCH 30.7 26.0 - 34.0 pg   MCHC 33.5 30.0 - 36.0 g/dL   RDW 13.4 11.5 - 15.5 %   Platelets 273 150 - 400 K/uL   Neutrophils Relative % 40 %   Neutro Abs 2.1 1.7 - 7.7 K/uL   Lymphocytes Relative 48 %   Lymphs Abs 2.4 0.7 - 4.0 K/uL   Monocytes Relative 7 %   Monocytes Absolute 0.4 0.1 - 1.0 K/uL   Eosinophils Relative 5 %   Eosinophils Absolute 0.3 0.0 - 0.7 K/uL   Basophils Relative 0 %   Basophils Absolute 0.0 0.0 - 0.1 K/uL    EKG:    Orders placed or performed in visit on 03/06/17  . EKG 12-Lead    Imaging Studies:  Imaging Results  US Transvaginal Non-ob  Result Date: 03/19/2017 CLINICAL DATA:  Pelvic pain for 6 months. EXAM: TRANSABDOMINAL AND TRANSVAGINAL ULTRASOUND OF PELVIS TECHNIQUE: Both transabdominal and transvaginal ultrasound examinations of the pelvis were performed. Transabdominal technique was performed for global imaging of the pelvis including uterus, ovaries, adnexal regions, and pelvic cul-de-sac. It was necessary to proceed with endovaginal exam following the transabdominal exam to visualize the uterus and ovaries. COMPARISON:  CT 03/19/2017. FINDINGS: Uterus Measurements: 7.4 x 2.9 x 5.1 cm . No fibroids or other mass visualized. Endometrium Thickness: 2.1 mm.  No focal abnormality visualized. Right ovary Measurements: 2.0 x 1.2 x 2.2 cm. Normal appearance/no adnexal mass. Left ovary Not visualized Other findings 9.2 x 4.4 x 4.5 cm complex mass noted in the pelvis posterior to the a uterus as noted on prior CT. This  mass appears to contain calcification. Again a process such as a dermoid or teratoma is favored. Other etiologies including ovarian malignancy cannot be excluded. Gynecologic evaluation suggested. IMPRESSION: 9.2 x 4.4 x 4.5 cm complex mass noted the pelvis posterior to the uterus as noted on prior CT. This mass appears to contain calcification. Again a process such as a dermoid or teratoma is favored. Gynecologic evaluation suggested. Electronically Signed   By: Marcello Moores  Register   On: 03/19/2017 12:25   US Pelvis Complete  Result Date: 03/19/2017 CLINICAL DATA:  Pelvic pain for 6 months. EXAM: TRANSABDOMINAL AND TRANSVAGINAL ULTRASOUND OF PELVIS TECHNIQUE: Both transabdominal and transvaginal ultrasound examinations of the pelvis were performed. Transabdominal technique was performed for global imaging of  the pelvis including uterus, ovaries, adnexal regions, and pelvic cul-de-sac. It was necessary to proceed with endovaginal exam following the transabdominal exam to visualize the uterus and ovaries. COMPARISON:  CT 03/19/2017. FINDINGS: Uterus Measurements: 7.4 x 2.9 x 5.1 cm . No fibroids or other mass visualized. Endometrium Thickness: 2.1 mm.  No focal abnormality visualized. Right ovary Measurements: 2.0 x 1.2 x 2.2 cm. Normal appearance/no adnexal mass. Left ovary Not visualized Other findings 9.2 x 4.4 x 4.5 cm complex mass noted in the pelvis posterior to the a uterus as noted on prior CT. This mass appears to contain calcification. Again a process such as a dermoid or teratoma is favored. Other etiologies including ovarian malignancy cannot be excluded. Gynecologic evaluation suggested. IMPRESSION: 9.2 x 4.4 x 4.5 cm complex mass noted the pelvis posterior to the uterus as noted on prior CT. This mass appears to contain calcification. Again a process such as a dermoid or teratoma is favored. Gynecologic evaluation suggested. Electronically Signed   By: Marcello Moores  Register   On: 03/19/2017 12:25   Ct  Abdomen Pelvis W Contrast  Result Date: 03/19/2017 CLINICAL DATA:  Severe nausea.  Right lower quadrant pain. EXAM: CT ABDOMEN AND PELVIS WITH CONTRAST TECHNIQUE: Multidetector CT imaging of the abdomen and pelvis was performed using the standard protocol following bolus administration of intravenous contrast. CONTRAST:  193mL ISOVUE-300 IOPAMIDOL (ISOVUE-300) INJECTION 61% COMPARISON:  None. FINDINGS: Lower chest: No acute finding. Nodular density along the lateral lower right breast was also seen on mammography from 2014. Hepatobiliary: No focal liver abnormality.No evidence of biliary obstruction or stone. Pancreas: Unremarkable. Spleen: Unremarkable. Adrenals/Urinary Tract: Negative adrenals. No hydronephrosis or stone. Unremarkable bladder. Stomach/Bowel: A few loops of small bowel have fecalized contents, but no transition point or bowel wall thickening. No appendicitis. Vascular/Lymphatic: No acute vascular abnormality. No mass or adenopathy. Reproductive:Cystic mass in the rectovaginal recess, right eccentric, measuring up to 8 cm. The mass has a fluid fluid level, which may include lipid superiorly. Small mural calcification is noted. No evidence of neighboring ovarian edema. Other: No ascites or pneumoperitoneum. Musculoskeletal: No acute abnormalities. Prominent facet arthropathy, especially of the lumbar spine. Prominent thoracic spondylosis. Advanced diffuse facet arthropathy IMPRESSION: 1. 8 cm cystic mass in the rectovaginal recess with fluid fluid level, possibly containing lipid. Dermoid is favored, recommend pelvic ultrasound follow-up. 2. A few bowel loops contain fecalized material as seen with slow transit, but no transition point or inflammatory wall thickening. 3. No appendicitis. Electronically Signed   By: Monte Fantasia M.D.   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 RLQ  abdominal pain 03/19/2017  . Nausea 03/19/2017  . Welcome to Medicare preventive visit 03/06/2017  . Prediabetes 03/30/2015  . Back pain with radiation 07/13/2012  . Hyperlipemia 06/22/2008  . Allergic rhinitis 01/08/2008  . OVERACTIVE BLADDER 04/10/2007  . GAD (generalized anxiety disorder) 08/22/2006  . Adjustment reaction with anxiety and depression 08/22/2006  . OSTEOARTHRITIS 08/22/2006    Plan: Laparoscopic right salpingo oophorectomy with removal of dermoid cyst 04/09/2017  EURE,LUTHER H 03/21/2017 12:22 PM

## 2017-04-09 ENCOUNTER — Encounter (HOSPITAL_COMMUNITY): Payer: Self-pay | Admitting: Anesthesiology

## 2017-04-09 ENCOUNTER — Encounter (HOSPITAL_COMMUNITY)
Admission: RE | Disposition: A | Discharge status: Home / Self Care | Payer: Self-pay | Source: Ambulatory Visit | Attending: Obstetrics & Gynecology

## 2017-04-09 ENCOUNTER — Ambulatory Visit (HOSPITAL_COMMUNITY): Payer: Medicare Other | Admitting: Anesthesiology

## 2017-04-09 ENCOUNTER — Ambulatory Visit (HOSPITAL_COMMUNITY)
Admission: RE | Admit: 2017-04-09 | Discharge: 2017-04-09 | Disposition: A | Discharge status: Home / Self Care | Payer: Medicare Other | Source: Ambulatory Visit | Attending: Obstetrics & Gynecology | Admitting: Obstetrics & Gynecology

## 2017-04-09 DIAGNOSIS — Z833 Family history of diabetes mellitus: Secondary | ICD-10-CM | POA: Insufficient documentation

## 2017-04-09 DIAGNOSIS — Z803 Family history of malignant neoplasm of breast: Secondary | ICD-10-CM | POA: Insufficient documentation

## 2017-04-09 DIAGNOSIS — F4323 Adjustment disorder with mixed anxiety and depressed mood: Secondary | ICD-10-CM | POA: Insufficient documentation

## 2017-04-09 DIAGNOSIS — K219 Gastro-esophageal reflux disease without esophagitis: Secondary | ICD-10-CM | POA: Insufficient documentation

## 2017-04-09 DIAGNOSIS — Z8249 Family history of ischemic heart disease and other diseases of the circulatory system: Secondary | ICD-10-CM | POA: Insufficient documentation

## 2017-04-09 DIAGNOSIS — R7303 Prediabetes: Secondary | ICD-10-CM | POA: Diagnosis not present

## 2017-04-09 DIAGNOSIS — E785 Hyperlipidemia, unspecified: Secondary | ICD-10-CM | POA: Insufficient documentation

## 2017-04-09 DIAGNOSIS — Z8489 Family history of other specified conditions: Secondary | ICD-10-CM | POA: Insufficient documentation

## 2017-04-09 DIAGNOSIS — M1990 Unspecified osteoarthritis, unspecified site: Secondary | ICD-10-CM | POA: Insufficient documentation

## 2017-04-09 DIAGNOSIS — N3281 Overactive bladder: Secondary | ICD-10-CM | POA: Insufficient documentation

## 2017-04-09 DIAGNOSIS — N839 Noninflammatory disorder of ovary, fallopian tube and broad ligament, unspecified: Secondary | ICD-10-CM | POA: Diagnosis present

## 2017-04-09 DIAGNOSIS — Z886 Allergy status to analgesic agent status: Secondary | ICD-10-CM | POA: Insufficient documentation

## 2017-04-09 DIAGNOSIS — Z90710 Acquired absence of both cervix and uterus: Secondary | ICD-10-CM | POA: Insufficient documentation

## 2017-04-09 DIAGNOSIS — Z840 Family history of diseases of the skin and subcutaneous tissue: Secondary | ICD-10-CM | POA: Diagnosis not present

## 2017-04-09 DIAGNOSIS — Z820 Family history of epilepsy and other diseases of the nervous system: Secondary | ICD-10-CM | POA: Diagnosis not present

## 2017-04-09 DIAGNOSIS — J309 Allergic rhinitis, unspecified: Secondary | ICD-10-CM | POA: Diagnosis not present

## 2017-04-09 DIAGNOSIS — D270 Benign neoplasm of right ovary: Secondary | ICD-10-CM | POA: Insufficient documentation

## 2017-04-09 HISTORY — PX: LAPAROSCOPIC SALPINGO OOPHERECTOMY: SHX5927

## 2017-04-09 LAB — GLUCOSE, CAPILLARY
Glucose-Capillary: 112 mg/dL — ABNORMAL HIGH (ref 65–99)
Glucose-Capillary: 115 mg/dL — ABNORMAL HIGH (ref 65–99)

## 2017-04-09 SURGERY — SALPINGO-OOPHORECTOMY, LAPAROSCOPIC
Anesthesia: General | Laterality: Right

## 2017-04-09 MED ORDER — BUPIVACAINE LIPOSOME 1.3 % IJ SUSP
INTRAMUSCULAR | Status: DC | PRN
  Administered 2017-04-09: 20 mL

## 2017-04-09 MED ORDER — CEFAZOLIN SODIUM-DEXTROSE 2-4 GM/100ML-% IV SOLN
INTRAVENOUS | Status: AC
  Filled 2017-04-09: qty 100

## 2017-04-09 MED ORDER — ROCURONIUM 10MG/ML (10ML) SYRINGE FOR MEDFUSION PUMP - OPTIME
INTRAVENOUS | Status: DC | PRN
  Administered 2017-04-09: 10 mg via INTRAVENOUS
  Administered 2017-04-09: 35 mg via INTRAVENOUS

## 2017-04-09 MED ORDER — PROPOFOL 10 MG/ML IV BOLUS
INTRAVENOUS | Status: DC | PRN
  Administered 2017-04-09: 150 mg via INTRAVENOUS
  Administered 2017-04-09: 50 mg via INTRAVENOUS

## 2017-04-09 MED ORDER — ROCURONIUM BROMIDE 50 MG/5ML IV SOLN
INTRAVENOUS | Status: AC
  Filled 2017-04-09: qty 1

## 2017-04-09 MED ORDER — GLYCOPYRROLATE 0.2 MG/ML IJ SOLN
INTRAMUSCULAR | Status: DC | PRN
  Administered 2017-04-09: .7 mg via INTRAVENOUS

## 2017-04-09 MED ORDER — LACTATED RINGERS IV SOLN
INTRAVENOUS | Status: DC
  Administered 2017-04-09 (×2): via INTRAVENOUS

## 2017-04-09 MED ORDER — PROPOFOL 10 MG/ML IV BOLUS
INTRAVENOUS | Status: AC
  Filled 2017-04-09: qty 40

## 2017-04-09 MED ORDER — FENTANYL CITRATE (PF) 100 MCG/2ML IJ SOLN
25.0000 ug | INTRAMUSCULAR | Status: DC | PRN
  Administered 2017-04-09 (×2): 50 ug via INTRAVENOUS

## 2017-04-09 MED ORDER — LIDOCAINE HCL (PF) 1 % IJ SOLN
INTRAMUSCULAR | Status: AC
  Filled 2017-04-09: qty 5

## 2017-04-09 MED ORDER — 0.9 % SODIUM CHLORIDE (POUR BTL) OPTIME
TOPICAL | Status: DC | PRN
  Administered 2017-04-09: 1000 mL

## 2017-04-09 MED ORDER — ONDANSETRON HCL 4 MG/2ML IJ SOLN
4.0000 mg | Freq: Once | INTRAMUSCULAR | Status: AC
  Administered 2017-04-09: 4 mg via INTRAVENOUS

## 2017-04-09 MED ORDER — ONDANSETRON 8 MG PO TBDP: 8.0000 mg | ORAL_TABLET | Freq: Three times a day (TID) | ORAL | 0 refills | Status: DC | PRN

## 2017-04-09 MED ORDER — LIDOCAINE HCL (CARDIAC) 10 MG/ML IV SOLN
INTRAVENOUS | Status: DC | PRN
  Administered 2017-04-09: 40 mg via INTRAVENOUS

## 2017-04-09 MED ORDER — OXYCODONE-ACETAMINOPHEN 7.5-325 MG PO TABS: 1.0000 | ORAL_TABLET | Freq: Four times a day (QID) | ORAL | 0 refills | Status: DC | PRN

## 2017-04-09 MED ORDER — FENTANYL CITRATE (PF) 100 MCG/2ML IJ SOLN
INTRAMUSCULAR | Status: DC | PRN
  Administered 2017-04-09: 25 ug via INTRAVENOUS
  Administered 2017-04-09 (×2): 50 ug via INTRAVENOUS
  Administered 2017-04-09: 25 ug via INTRAVENOUS
  Administered 2017-04-09 (×2): 50 ug via INTRAVENOUS

## 2017-04-09 MED ORDER — MIDAZOLAM HCL 2 MG/2ML IJ SOLN
INTRAMUSCULAR | Status: AC
Start: 2017-04-09 — End: ?
  Filled 2017-04-09: qty 2

## 2017-04-09 MED ORDER — SODIUM CHLORIDE 0.9 % IR SOLN
Status: DC | PRN
  Administered 2017-04-09: 1000 mL
  Administered 2017-04-09: 3000 mL

## 2017-04-09 MED ORDER — ONDANSETRON HCL 4 MG/2ML IJ SOLN
INTRAMUSCULAR | Status: AC
  Filled 2017-04-09: qty 2

## 2017-04-09 MED ORDER — FENTANYL CITRATE (PF) 100 MCG/2ML IJ SOLN
INTRAMUSCULAR | Status: AC
  Filled 2017-04-09: qty 2

## 2017-04-09 MED ORDER — NEOSTIGMINE METHYLSULFATE 10 MG/10ML IV SOLN
INTRAVENOUS | Status: DC | PRN
  Administered 2017-04-09: 4 mg via INTRAVENOUS

## 2017-04-09 MED ORDER — BUPIVACAINE LIPOSOME 1.3 % IJ SUSP
INTRAMUSCULAR | Status: AC
  Filled 2017-04-09: qty 20

## 2017-04-09 MED ORDER — BUPIVACAINE LIPOSOME 1.3 % IJ SUSP: 20.0000 mL | Freq: Once | INTRAMUSCULAR | Status: DC

## 2017-04-09 MED ORDER — FENTANYL CITRATE (PF) 250 MCG/5ML IJ SOLN
INTRAMUSCULAR | Status: AC
  Filled 2017-04-09: qty 5

## 2017-04-09 MED ORDER — MIDAZOLAM HCL 2 MG/2ML IJ SOLN
1.0000 mg | INTRAMUSCULAR | Status: AC
  Administered 2017-04-09: 2 mg via INTRAVENOUS

## 2017-04-09 MED ORDER — CEFAZOLIN SODIUM-DEXTROSE 2-4 GM/100ML-% IV SOLN
2.0000 g | INTRAVENOUS | Status: AC
  Administered 2017-04-09: 2 g via INTRAVENOUS
  Filled 2017-04-09: qty 100

## 2017-04-09 MED ORDER — CEFAZOLIN SODIUM-DEXTROSE 1-4 GM/50ML-% IV SOLN
INTRAVENOUS | Status: AC
  Filled 2017-04-09: qty 50

## 2017-04-09 SURGICAL SUPPLY — 63 items
APPLIER CLIP 5 13 M/L LIGAMAX5 (MISCELLANEOUS)
APPLIER CLIP ROT 10 11.4 M/L (STAPLE) ×3
APPLIER CLIP UNV 5X34 EPIX (ENDOMECHANICALS) ×1 IMPLANT
APR CLP MED LRG 11.4X10 (STAPLE) ×1
APR CLP MED LRG 5 ANG JAW (MISCELLANEOUS)
APR XCLPCLP 20M/L UNV 34X5 (ENDOMECHANICALS)
BAG HAMPER (MISCELLANEOUS) ×3 IMPLANT
BAG RETRIEVAL 10 (BASKET) ×1
BAG RETRIEVAL 10MM (BASKET) ×1
BLADE SURG SZ11 CARB STEEL (BLADE) ×3 IMPLANT
CATH FOLEY LATEX FREE 20FR (CATHETERS) ×3
CATH FOLEY LF 20FR (CATHETERS) IMPLANT
CLIP APPLIE 5 13 M/L LIGAMAX5 (MISCELLANEOUS) ×1 IMPLANT
CLIP APPLIE ROT 10 11.4 M/L (STAPLE) ×1 IMPLANT
CLOTH BEACON ORANGE TIMEOUT ST (SAFETY) ×3 IMPLANT
COVER LIGHT HANDLE STERIS (MISCELLANEOUS) ×6 IMPLANT
ELECT REM PT RETURN 9FT ADLT (ELECTROSURGICAL) ×3
ELECTRODE REM PT RTRN 9FT ADLT (ELECTROSURGICAL) ×1 IMPLANT
FILTER SMOKE EVAC LAPAROSHD (FILTER) ×3 IMPLANT
FORMALIN 10 PREFIL 480ML (MISCELLANEOUS) ×3 IMPLANT
GAUZE SPONGE 4X4 12PLY STRL (GAUZE/BANDAGES/DRESSINGS) ×2 IMPLANT
GLOVE BIOGEL M STER SZ 6 (GLOVE) ×4 IMPLANT
GLOVE BIOGEL PI IND STRL 6.5 (GLOVE) IMPLANT
GLOVE BIOGEL PI IND STRL 7.0 (GLOVE) ×1 IMPLANT
GLOVE BIOGEL PI IND STRL 8 (GLOVE) ×1 IMPLANT
GLOVE BIOGEL PI INDICATOR 6.5 (GLOVE) ×4
GLOVE BIOGEL PI INDICATOR 7.0 (GLOVE) ×8
GLOVE BIOGEL PI INDICATOR 8 (GLOVE) ×2
GLOVE ECLIPSE 8.0 STRL XLNG CF (GLOVE) ×3 IMPLANT
GOWN STRL REUS W/TWL LRG LVL3 (GOWN DISPOSABLE) ×6 IMPLANT
GOWN STRL REUS W/TWL XL LVL3 (GOWN DISPOSABLE) ×3 IMPLANT
INST SET LAPROSCOPIC GYN AP (KITS) ×3 IMPLANT
IV NS IRRIG 3000ML ARTHROMATIC (IV SOLUTION) ×2 IMPLANT
KIT ROOM TURNOVER APOR (KITS) ×3 IMPLANT
MANIFOLD NEPTUNE II (INSTRUMENTS) ×3 IMPLANT
NDL HYPO 18GX1.5 BLUNT FILL (NEEDLE) ×1 IMPLANT
NDL HYPO 25X1 1.5 SAFETY (NEEDLE) ×1 IMPLANT
NEEDLE HYPO 18GX1.5 BLUNT FILL (NEEDLE) ×3 IMPLANT
NEEDLE HYPO 25X1 1.5 SAFETY (NEEDLE) ×3 IMPLANT
NEEDLE INSUFFLATION 120MM (ENDOMECHANICALS) ×3 IMPLANT
NS IRRIG 1000ML POUR BTL (IV SOLUTION) ×3 IMPLANT
PACK PERI GYN (CUSTOM PROCEDURE TRAY) ×3 IMPLANT
PAD ARMBOARD 7.5X6 YLW CONV (MISCELLANEOUS) ×3 IMPLANT
SET BASIN LINEN APH (SET/KITS/TRAYS/PACK) ×3 IMPLANT
SET TUBE IRRIG SUCTION NO TIP (IRRIGATION / IRRIGATOR) ×2 IMPLANT
SHEARS HARMONIC ACE PLUS 36CM (ENDOMECHANICALS) ×3 IMPLANT
SLEEVE ENDOPATH XCEL 5M (ENDOMECHANICALS) ×2 IMPLANT
SOLUTION ANTI FOG 6CC (MISCELLANEOUS) ×3 IMPLANT
SPONGE GAUZE 2X2 8PLY STER LF (GAUZE/BANDAGES/DRESSINGS)
SPONGE GAUZE 2X2 8PLY STRL LF (GAUZE/BANDAGES/DRESSINGS) ×3 IMPLANT
SPONGE LAP 18X18 X RAY DECT (DISPOSABLE) ×2 IMPLANT
STAPLER VISISTAT 35W (STAPLE) ×3 IMPLANT
SUT VICRYL 0 UR6 27IN ABS (SUTURE) ×5 IMPLANT
SYR 20CC LL (SYRINGE) ×3 IMPLANT
SYRINGE 10CC LL (SYRINGE) ×3 IMPLANT
SYS BAG RETRIEVAL 10MM (BASKET) ×1
SYSTEM BAG RETRIEVAL 10MM (BASKET) IMPLANT
TRAY FOLEY CATH SILVER 16FR (SET/KITS/TRAYS/PACK) ×3 IMPLANT
TROCAR ENDO BLADELESS 11MM (ENDOMECHANICALS) ×3 IMPLANT
TROCAR XCEL NON-BLD 5MMX100MML (ENDOMECHANICALS) ×3 IMPLANT
TROCAR XCEL UNIV SLVE 11M 100M (ENDOMECHANICALS) ×1 IMPLANT
TUBING INSUF HEATED (TUBING) ×3 IMPLANT
WARMER LAPAROSCOPE (MISCELLANEOUS) ×3 IMPLANT

## 2017-04-09 NOTE — Progress Notes (Signed)
Resting quietly. resp adequate/nonlabored.

## 2017-04-09 NOTE — Progress Notes (Signed)
From OR. Restless. Yelling. C/O postop abd pain. Pain med per anesthesia. Oriented to place per nurse.  Reassurance given.

## 2017-04-09 NOTE — Anesthesia Preprocedure Evaluation (Signed)
Anesthesia Evaluation  Patient identified by MRN, date of birth, ID band Patient awake    Reviewed: Allergy & Precautions, NPO status , Patient's Chart, lab work & pertinent test results  Airway Mallampati: III  TM Distance: >3 FB Neck ROM: Full  Mouth opening: Limited Mouth Opening Comment: TMJ pain = limited opening  Dental  (+) Teeth Intact   Pulmonary neg pulmonary ROS,    breath sounds clear to auscultation       Cardiovascular  Rhythm:Regular Rate:Normal     Neuro/Psych PSYCHIATRIC DISORDERS Anxiety    GI/Hepatic GERD  Medicated and Controlled,  Endo/Other  diabetes, Type 2  Renal/GU      Musculoskeletal   Abdominal   Peds  Hematology   Anesthesia Other Findings   Reproductive/Obstetrics                             Anesthesia Physical Anesthesia Plan  ASA: II  Anesthesia Plan: General   Post-op Pain Management:    Induction: Intravenous  PONV Risk Score and Plan:   Airway Management Planned: Oral ETT  Additional Equipment:   Intra-op Plan:   Post-operative Plan: Extubation in OR  Informed Consent:   Plan Discussed with:   Anesthesia Plan Comments:         Anesthesia Quick Evaluation

## 2017-04-09 NOTE — Progress Notes (Signed)
Awake. Continues c/o postop abd pain. Pain med as noted. Continues yelling. Reassurance given. Oriented to place per nurse.

## 2017-04-09 NOTE — Op Note (Signed)
Preoperative diagnosis:  10 cm right ovarian mass consistent with a benign cystic teratoma  Postoperative diagnosis:  Same as above  Procedure:  Laparoscopic right salpingo-oophorectomy with removal of benign cystic teratoma  Surgeon:  Florian Buff, MD   Anesthesia:  Gen. Endotracheal  Findings: [name] a 36 y.Y.[id number] with No LMP recorded. 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 Not her left tube and ovary, or her uterus, only wants the problem removed  Intraoperative findings:  Patient in atrophic appearing left ovary and tube and there was no evidence of a mass the uterus was also small and atrophic.  The right ovary obviously had a large benign cystic teratoma consistent with the measures preoperatively and sonogram of 10 cm The tube appeared to be normal all the other peritoneal surfaces were normal there were no other abnormalities noted  Description of operation: Patient was taken to the operating room and placed in the supine position where she underwent general endotracheal anesthesia She was placed in low lithotomy position She was prepped and draped in the usual sterile fashion A Foley catheter was placed An incision was made in the umbilicus and a varies needle was placed into the peritoneal cavity with one pass without difficulty The peritoneal cavity was insufflated A 1011 trocar was then placed into the peritoneal cavity under direct laparoscopic visualization without difficulty Incisions were made in the right and left lower quadrant and 5 mm trochars were placed in both lower quadrants under direct visualization without difficulty avoiding the inferior epigastric artery The mass in the right ovary was identified The harmonic scalpel was used and the infundibulopelvic ligament  and the utero-ovarian ligament were cut using the Harmonic scalpel There was good hemostasis of the pedicle A 5 mm scope was then used and placed in the left lower quadrant An Endo Catch bag was placed in the mass and tube were placed in the bag in total The umbilical fascial opening was enlarged and the bag with the mass inside was brought up to the skin While in the bag I made an incision in the mass and suctioned and removed enough sebum from the mass to allow it to be removed in total in the bag through the umbilical incision There was no contamination of the peritoneal cavity The pelvis was irrigated vigorously and all pedicles were found to be hemostatic Again there were no abnormalities of the peritoneum otherwise The umbilical fascia was closed using 0 Vicryl running The subcutaneous tissue of the umbilicus was also reapproximated loosely using 0 Vicryl interrupted All 3 skin incisions were closed using skin staples A total of 20 cc of X per L was injected in 3 incisions 5 cc in each lower quadrant incision and 10 cc in the umbilical incision There was minimal blood loss for the procedure The patient tolerated the procedure well and she was awakened from anesthesia and stable condition She was taken to the recovery room doing well  She received 2 g of Ancef prophylactically  She will be discharged directly from the PACU  Florian Buff, MD 04/09/2017 10:09 AM

## 2017-04-09 NOTE — Anesthesia Procedure Notes (Signed)
Procedure Name: Intubation Date/Time: 04/09/2017 8:42 AM Performed by: [name] E Pre-anesthesia Checklist: Patient identified, Patient being monitored, Timeout performed, Emergency Drugs available and Suction available Patient Re-evaluated:Patient Re-evaluated prior to inductionOxygen Delivery Method: Circle system utilized Preoxygenation: Pre-oxygenation with 100% oxygen Intubation Type: IV induction Ventilation: Mask ventilation without difficulty Laryngoscope Size: Glidescope (S3) Grade View: Grade I Tube type: Oral Tube size: 7.0 mm Number of attempts: 1 Airway Equipment and Method: Rigid stylet Placement Confirmation: ETT inserted through vocal cords under direct vision,  positive ETCO2 and breath sounds checked- equal and bilateral Secured at: 21 cm Tube secured with: Tape Dental Injury: Teeth and Oropharynx as per pre-operative assessment

## 2017-04-09 NOTE — Discharge Instructions (Signed)
Laparoscopic Ovarian Torsion Surgery, Care After Refer to this sheet in the next few weeks. These instructions provide you with information about caring for yourself after your procedure. Your health care provider may also give you more specific instructions. Your treatment has been planned according to current medical practices, but problems sometimes occur. Call your health care provider if you have any problems or questions after your procedure. What can I expect after the procedure? After your procedure, it is common to have:  A small amount of blood or clear fluid coming from the cuts made during surgery (incisions).  Mild cramping in your lower abdomen.  Follow these instructions at home: Incision care   Follow instructions from your health care provider about how to take care of your incisions. Make sure you: ? Wash your hands with soap and water before you change your bandage (dressing). If soap and water are not available, use hand sanitizer. ? Change your dressing as told by your health care provider. ? Leave stitches (sutures), skin glue, or adhesive strips in place. These skin closures may need to be in place for 2 weeks or longer. If adhesive strip edges start to loosen and curl up, you may trim the loose edges. Do not remove adhesive strips completely unless your health care provider tells you to do that.  Check your incision areas every day for signs of infection. Check for: ? More redness, swelling, or pain. ? More fluid or blood. ? Warmth. ? Pus or a bad smell.  Keep your incision areas clean and dry. Do not take baths, swim, or use a hot tub until your health care provider approves. General instructions  Take over-the-counter and prescription medicines only as told by your health care provider.  Return to your normal activities as told by your health care provider. Ask your health care provider what activities are safe for you. Ask about: ? Returning to work and  exercise. ? If you have any weight restrictions. ? When it is okay to resume sexual activity.  Drink enough fluid to keep your urine clear or pale yellow.  Keep all follow-up visits as told by your health care provider. This is important. Contact a health care provider if:  You have unusual bleeding or discharge from your vagina.  You have pain in your abdomen that gets worse or does not get better with medicine.  You feel nauseous.  You havemore redness, swelling, or pain at the site of your incisions.  You have more fluid or blood coming from your incisions.  Your incisions feel warm to the touch.  Your have pus or a bad smell coming from your incisions. Get help right away if:  You have a fever.  You have a lot of unusual bleeding or discharge from your vagina.  You have severe pain in your abdomen.  You cannot stop vomiting.  You have nausea that does not get better. This information is not intended to replace advice given to you by your health care provider. Make sure you discuss any questions you have with your health care provider. Document Released: 10/03/2011 Document Revised: 06/12/2016 Document Reviewed: 09/25/2015 Elsevier Interactive Patient Education  [employer].

## 2017-04-09 NOTE — Transfer of Care (Signed)
Immediate Anesthesia Transfer of Care Note  Patient: [name]  Procedure(s) Performed: Procedure(s): LAPAROSCOPIC RIGHT SALPINGO OOPHORECTOMY; RIGHT OVARIAN BENIGN CYSTIC TERATOMA (Right)  Patient Location: PACU  Anesthesia Type:General  Level of Consciousness: awake  Airway & Oxygen Therapy: Patient Spontanous Breathing and Patient connected to face mask oxygen  Post-op Assessment: Report given to RN  Post vital signs: Reviewed and stable  Last Vitals:  Vitals:   04/09/17 0825 04/09/17 0830  BP: 133/80   Pulse:    Resp: (!) 0 (!) 29  Temp:      Last Pain:  Vitals:   04/09/17 0721  TempSrc: Oral  PainSc: 3       Patients Stated Pain Goal: 3 (23/36/12 2449)  Complications: No apparent anesthesia complications

## 2017-04-09 NOTE — Anesthesia Postprocedure Evaluation (Signed)
Anesthesia Post Note  Patient: [name]  Procedure(s) Performed: Procedure(s) (LRB): LAPAROSCOPIC RIGHT SALPINGO OOPHORECTOMY; RIGHT OVARIAN BENIGN CYSTIC TERATOMA (Right)  Patient location during evaluation: PACU Anesthesia Type: General Level of consciousness: awake and alert and oriented Pain management: satisfactory to patient Vital Signs Assessment: post-procedure vital signs reviewed and stable Respiratory status: spontaneous breathing Cardiovascular status: blood pressure returned to baseline and stable Postop Assessment: no signs of nausea or vomiting Anesthetic complications: no     Last Vitals:  Vitals:   04/09/17 1130 04/09/17 1136  BP: 124/78 (!) 109/58  Pulse: 81 76  Resp: 13 12  Temp:  37 C    Last Pain:  Vitals:   04/09/17 1136  TempSrc: Oral  PainSc:                  [name]

## 2017-04-09 NOTE — Interval H&P Note (Signed)
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 After consideration of risks, benefits and other options for treatment, the patient has consented to  Procedure(s): LAPAROSCOPIC SALPINGO OOPHORECTOMY (Right) as a surgical intervention .  The patient's history has been reviewed, patient examined, no change in status, stable for surgery.  I have reviewed the patient's chart and labs.  Questions were answered to the patient's satisfaction.     EURE,LUTHER H

## 2017-04-09 NOTE — Progress Notes (Signed)
Continues to be restless. C/O postop abd pain. Med as noted. Oriented to place per nurse. Reassurance given. resp adequate/nonlabored. O2 continued.

## 2017-04-10 ENCOUNTER — Encounter (HOSPITAL_COMMUNITY): Payer: Self-pay | Admitting: Obstetrics & Gynecology

## 2017-04-17 ENCOUNTER — Encounter: Payer: Self-pay | Admitting: Obstetrics & Gynecology

## 2017-04-17 ENCOUNTER — Ambulatory Visit (INDEPENDENT_AMBULATORY_CARE_PROVIDER_SITE_OTHER): Payer: Medicare Other | Admitting: Obstetrics & Gynecology

## 2017-04-17 VITALS — BP 118/80 | HR 80 | Wt 159.4 lb

## 2017-04-17 DIAGNOSIS — Z9889 Other specified postprocedural states: Secondary | ICD-10-CM

## 2017-04-17 NOTE — Progress Notes (Signed)
  HPI: Patient returns for routine postoperative follow-up having undergone Laparoscopic right salpingo-oophorectomy for a benign cystic teratoma on 04/09/2017.  The patient's immediate postoperative recovery has been unremarkable. Since hospital discharge the patient reports sluggish bowel activity and normal postop pain otherwise no significant problems.   Current Outpatient Prescriptions: aspirin EC 81 MG tablet, Take 81 mg by mouth daily., Disp: , Rfl:  citalopram (CELEXA) 40 MG tablet, TAKE ONE TABLET BY MOUTH ONCE DAILY., Disp: 90 tablet, Rfl: 1 gabapentin (NEURONTIN) 300 MG capsule, TAKE (1) CAPSULE BY MOUTH TWICE DAILY. (Patient taking differently: TAKE 1 CAPSULE (300 MG) BY MOUTH TWICE DAILY AS NEEDED FOR NERVE PAIN), Disp: 180 capsule, Rfl: 1 loratadine (CLARITIN) 10 MG tablet, TAKE (1) TABLET BY MOUTH ONCE DAILY AS NEEDED FOR ALLERGIES. (Patient taking differently: TAKE (1) TABLET BY MOUTH ONCE DAILY), Disp: 90 tablet, Rfl: 1 Omega-3 Fatty Acids (FISH OIL CONCENTRATE) 300 MG CAPS, Take 600 mg by mouth daily., Disp: , Rfl:  ondansetron (ZOFRAN ODT) 8 MG disintegrating tablet, Take 1 tablet (8 mg total) by mouth every 8 (eight) hours as needed for nausea or vomiting., Disp: 20 tablet, Rfl: 0 oxyCODONE-acetaminophen (PERCOCET) 7.5-325 MG tablet, Take 1-2 tablets by mouth every 6 (six) hours as needed., Disp: 30 tablet, Rfl: 0 pravastatin (PRAVACHOL) 80 MG tablet, TAKE ONE TABLET BY MOUTH ONCE DAILY. (Patient taking differently: TAKE ONE TABLET BY MOUTH ONCE DAILY AT NIGHT), Disp: 90 tablet, Rfl: 1 vitamin C (ASCORBIC ACID) 500 MG tablet, Take 500 mg by mouth daily., Disp: , Rfl:  vitamin E 1000 UNIT capsule, Take 1,000 Units by mouth daily., Disp: , Rfl:    No current facility-administered medications for this visit.     Blood pressure 118/80, pulse 80, weight 159 lb 6.4 oz (72.3 kg).  Physical Exam: Incisions 3 are clean dry and intact staples were removed gentian violet is placed  on the umbilical incision  Diagnostic Tests:   Pathology: Benign cystic teratoma no atypia  Impression: Status post laparoscopic RSO  Plan: Routine postop care I have recommended Dulcolax suppositories to reinvigorated her bowel function Which should also get better when she is no longer using pain medicine  Follow up: 1  years  Florian Buff, MD

## 2017-06-02 ENCOUNTER — Telehealth: Payer: Self-pay | Admitting: Family Medicine

## 2017-06-02 ENCOUNTER — Other Ambulatory Visit: Payer: Self-pay | Admitting: Family Medicine

## 2017-06-02 MED ORDER — MONTELUKAST SODIUM 10 MG PO TABS: 10.0000 mg | ORAL_TABLET | Freq: Every day | ORAL | 1 refills | Status: DC

## 2017-06-02 MED ORDER — AZELASTINE HCL 0.1 % NA SOLN: 2.0000 | Freq: Two times a day (BID) | NASAL | 1 refills | Status: DC

## 2017-06-02 NOTE — Telephone Encounter (Signed)
Patient requesting "nose drops" for the itching in her ears and something for the cough. She is still taking the Claratin.  She thinks this is all  related to her allergies. Please call in at [facility].

## 2017-06-02 NOTE — Progress Notes (Unsigned)
astelin  

## 2017-06-02 NOTE — Telephone Encounter (Signed)
Astelin nose spray and Singulair are prescribed , I am unable to leave a message or spk with t, pls let her know if she calls back, continue the claritin also

## 2017-06-03 NOTE — Telephone Encounter (Signed)
Called patient regarding message below. No answer, unable to leave message.  

## 2017-06-04 NOTE — Telephone Encounter (Signed)
Called patient regarding message below. No answer, unable to leave message.  

## 2017-06-13 ENCOUNTER — Other Ambulatory Visit: Payer: Self-pay | Admitting: Family Medicine

## 2017-06-13 DIAGNOSIS — E782 Mixed hyperlipidemia: Secondary | ICD-10-CM

## 2017-07-08 ENCOUNTER — Encounter: Payer: Self-pay | Admitting: Family Medicine

## 2017-07-16 ENCOUNTER — Other Ambulatory Visit: Payer: Self-pay | Admitting: Family Medicine

## 2017-08-14 DIAGNOSIS — Z23 Encounter for immunization: Secondary | ICD-10-CM | POA: Diagnosis not present

## 2017-08-22 ENCOUNTER — Other Ambulatory Visit: Payer: Self-pay | Admitting: Family Medicine

## 2017-08-22 DIAGNOSIS — F4323 Adjustment disorder with mixed anxiety and depressed mood: Secondary | ICD-10-CM

## 2017-10-29 ENCOUNTER — Telehealth: Payer: Self-pay | Admitting: Family Medicine

## 2017-10-29 ENCOUNTER — Other Ambulatory Visit: Payer: Self-pay

## 2017-10-29 MED ORDER — LORATADINE 10 MG PO TABS: ORAL_TABLET | ORAL | 1 refills | Status: DC

## 2017-10-29 MED ORDER — GABAPENTIN 300 MG PO CAPS: ORAL_CAPSULE | ORAL | 1 refills | Status: DC

## 2017-10-29 NOTE — Telephone Encounter (Signed)
Please call in refills for Gabapentin & Loratadine

## 2017-10-29 NOTE — Telephone Encounter (Signed)
Refills sent in

## 2017-11-26 ENCOUNTER — Ambulatory Visit: Payer: Self-pay | Admitting: Family Medicine

## 2017-11-27 ENCOUNTER — Ambulatory Visit: Payer: Self-pay | Admitting: Family Medicine

## 2017-11-28 ENCOUNTER — Other Ambulatory Visit: Payer: Self-pay

## 2017-11-28 ENCOUNTER — Telehealth: Payer: Self-pay | Admitting: Family Medicine

## 2017-11-28 DIAGNOSIS — F4323 Adjustment disorder with mixed anxiety and depressed mood: Secondary | ICD-10-CM

## 2017-11-28 MED ORDER — CITALOPRAM HYDROBROMIDE 40 MG PO TABS: 40.0000 mg | ORAL_TABLET | Freq: Every day | ORAL | 1 refills | Status: DC

## 2017-11-28 NOTE — Telephone Encounter (Signed)
Pharmacy: [facility] Cb#: [phone]  Patient is requesting a refill of celexa  Please call her to let her know if this can be refilled.

## 2017-11-28 NOTE — Telephone Encounter (Signed)
Called back no answer 

## 2017-11-28 NOTE — Telephone Encounter (Signed)
Refilled

## 2017-12-03 ENCOUNTER — Ambulatory Visit: Payer: Self-pay | Admitting: Family Medicine

## 2017-12-29 ENCOUNTER — Ambulatory Visit: Payer: Self-pay | Admitting: Family Medicine

## 2017-12-29 ENCOUNTER — Telehealth: Payer: Self-pay | Admitting: Family Medicine

## 2017-12-29 NOTE — Telephone Encounter (Signed)
Pt LVM for someone to call, I have called. The pt VM is full

## 2018-01-01 ENCOUNTER — Ambulatory Visit (INDEPENDENT_AMBULATORY_CARE_PROVIDER_SITE_OTHER): Payer: Self-pay | Admitting: Family Medicine

## 2018-01-01 ENCOUNTER — Encounter: Payer: Self-pay | Admitting: Family Medicine

## 2018-01-01 VITALS — BP 158/82 | HR 68 | Resp 16 | Ht 63.0 in | Wt 167.0 lb

## 2018-01-01 DIAGNOSIS — I10 Essential (primary) hypertension: Secondary | ICD-10-CM

## 2018-01-01 DIAGNOSIS — Z1231 Encounter for screening mammogram for malignant neoplasm of breast: Secondary | ICD-10-CM

## 2018-01-01 DIAGNOSIS — F411 Generalized anxiety disorder: Secondary | ICD-10-CM

## 2018-01-01 DIAGNOSIS — R1013 Epigastric pain: Secondary | ICD-10-CM

## 2018-01-01 DIAGNOSIS — E785 Hyperlipidemia, unspecified: Secondary | ICD-10-CM

## 2018-01-01 DIAGNOSIS — R7303 Prediabetes: Secondary | ICD-10-CM

## 2018-01-01 DIAGNOSIS — R109 Unspecified abdominal pain: Secondary | ICD-10-CM | POA: Insufficient documentation

## 2018-01-01 DIAGNOSIS — F4323 Adjustment disorder with mixed anxiety and depressed mood: Secondary | ICD-10-CM

## 2018-01-01 MED ORDER — AMLODIPINE BESYLATE 2.5 MG PO TABS: 2.5000 mg | ORAL_TABLET | Freq: Every day | ORAL | 3 refills | Status: DC

## 2018-01-01 MED ORDER — RANITIDINE HCL 300 MG PO TABS: 300.0000 mg | ORAL_TABLET | Freq: Every day | ORAL | 1 refills | Status: DC

## 2018-01-01 NOTE — Patient Instructions (Addendum)
F/u 3rd week in April.  We will schedule mammogram at checkout  You are being referred to therapist who will call you on the telephone.  Resumemmedication for depression and anxiety  Start amlodipine for blood pressure  Start zantac for abdominal pain  Labs today breath test, lipid, cmp , TSH and HBA1C

## 2018-01-01 NOTE — Assessment & Plan Note (Signed)
Breath test , labs and omeprazole

## 2018-01-01 NOTE — Assessment & Plan Note (Signed)
Refer to telepsych, and resume medication

## 2018-01-04 NOTE — Assessment & Plan Note (Signed)
Uncontrolled, needs ot resume medication DASH diet and commitment to daily physical activity for a minimum of 30 minutes discussed and encouraged, as a part of hypertension management. The importance of attaining a healthy weight is also discussed.  BP/Weight 01/01/2018 04/17/2017 04/09/2017 04/04/2017 03/21/2017 03/19/2017 5/72/6203  Systolic BP 559 741 638 453 646 803 212  Diastolic BP 82 80 58 83 70 79 84  Wt. (Lbs) 167 159.4 162 162 162 160 160  BMI 29.58 28.24 28.7 28.7 28.7 28.34 28.34
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
## 2018-01-04 NOTE — Assessment & Plan Note (Signed)
Hyperlipidemia:Low fat diet discussed and encouraged.   Lipid Panel  Lab Results  Component Value Date   CHOL 253 (H) 01/26/2016   HDL 70 01/26/2016   LDLCALC 164 (H) 01/26/2016   TRIG 97 01/26/2016   CHOLHDL 3.6 01/26/2016   Updated lab needed

## 2018-01-04 NOTE — Assessment & Plan Note (Signed)
Telepsych and resume medication

## 2018-01-05 ENCOUNTER — Telehealth: Payer: Self-pay

## 2018-01-05 DIAGNOSIS — F331 Major depressive disorder, recurrent, moderate: Secondary | ICD-10-CM

## 2018-01-05 NOTE — BH Specialist Note (Signed)
[facility]  
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
## 2018-01-07 ENCOUNTER — Encounter (HOSPITAL_COMMUNITY): Payer: Self-pay | Admitting: Psychiatry

## 2018-01-07 NOTE — Telephone Encounter (Signed)
This encounter was created in error - please disregard.

## 2018-01-07 NOTE — Progress Notes (Signed)
[facility] Psychiatric Consultant Case Review   Summary [name] is a 67 y.o. year old female with history of depression, hypertension, hyperlipidemia.  Reports worsening depression with prominent low energy in the context of discord in relationship at church. She reports some drowsiness from antidepressant (she is on citalopram, gabapentin)   Functional Impairment: n/a Psychosocial factors: taking care of her son, age 43 yo with intellectual disability   Current Medications Current Outpatient Medications on File Prior to Visit  Medication Sig Dispense Refill  . amLODipine (NORVASC) 2.5 MG tablet Take 1 tablet (2.5 mg total) by mouth daily. 30 tablet 3  . aspirin EC 81 MG tablet Take 81 mg by mouth daily.    Marland Kitchen azelastine (ASTELIN) 0.1 % nasal spray Place 2 sprays into both nostrils 2 (two) times daily. Use in each nostril as directed 30 mL 1  . citalopram (CELEXA) 40 MG tablet Take 1 tablet (40 mg total) by mouth daily. 90 tablet 1  . gabapentin (NEURONTIN) 300 MG capsule TAKE (1) CAPSULE BY MOUTH TWICE DAILY. 180 capsule 1  . loratadine (CLARITIN) 10 MG tablet TAKE (1) TABLET BY MOUTH ONCE DAILY AS NEEDED FOR ALLERGIES. 90 tablet 1  . montelukast (SINGULAIR) 10 MG tablet Take 1 tablet (10 mg total) by mouth at bedtime. 30 tablet 1  . Omega-3 Fatty Acids (FISH OIL CONCENTRATE) 300 MG CAPS Take 600 mg by mouth daily.    . ondansetron (ZOFRAN ODT) 8 MG disintegrating tablet Take 1 tablet (8 mg total) by mouth every 8 (eight) hours as needed for nausea or vomiting. 20 tablet 0  . pravastatin (PRAVACHOL) 80 MG tablet TAKE ONE TABLET BY MOUTH ONCE DAILY. 90 tablet 0  . ranitidine (ZANTAC) 300 MG tablet Take 1 tablet (300 mg total) by mouth at bedtime. 30 tablet 1  . vitamin C (ASCORBIC ACID) 500 MG tablet Take 500 mg by mouth daily.    . vitamin E 1000 UNIT capsule Take 1,000 Units by mouth daily.    . [DISCONTINUED] oxybutynin (DITROPAN) 5 MG tablet Take 1  tablet (5 mg total) by mouth 2 (two) times daily. 60 tablet 4   No current facility-administered medications on file prior to visit.      Past psychiatry history Outpatient: denies Psychiatry admission: denies  Previous suicide attempt: denies Past trials of medication: citalopram History of violence: denies  Current measures Depression screen [facility] 2/9 01/05/2018 01/01/2018 03/06/2017 10/14/2016 02/01/2016  Decreased Interest 3 3 1  0 2  Down, Depressed, Hopeless 3 3 1  0 2  PHQ - 2 Score 6 6 2  0 4  Altered sleeping 2 2 1  - 2  Tired, decreased energy 2 3 0 - 1  Change in appetite 1 3 0 - 2  Feeling bad or failure about yourself  3 2 1  - 3  Trouble concentrating 0 2 1 - 2  Moving slowly or fidgety/restless 1 0 0 - 2  Suicidal thoughts 0 1 0 - 0  PHQ-9 Score 15 19 5  - 16  Difficult doing work/chores - - Somewhat difficult - -   GAD 7 : Generalized Anxiety Score 01/05/2018  Nervous, Anxious, on Edge 1  Control/stop worrying 1  Worry too much - different things 1  Trouble relaxing 2  Restless 1  Easily annoyed or irritable 0  Afraid - awful might happen 0  Total GAD 7 Score 6    Goals (patient centered) Increase healthy adjustment to current life circumstances  Assessment/Provisional Diagnosis #  MDD, moderate, recurrent without psychotic features Will recommend to switch citalopram to other SSRI to target depression given patient reported side effect and limited benefit under current situation. Will not try SNRI at this time given concern for hypertension. Noted that maximum citalopram dose recommended for patients age > 60 years is 20 mg to avoid risk for QTc prolongation.   Recommendation - Consider cross switching from citalopram to other SSRI, such as sertraline. Would recommend as below. Please discuss risk of serotonin syndrome, and potential GI side effect from sertraline. Week 1. Decrease citalopram 20 mg daily for one week, start sertraline 25 mg at night Week 2.  Discontinue citalopram, increase sertraline 50 mg at night Week 3. Increase sertraline 100 mg at night. Consider further uptitration up to 150 mg at night as indicated in the future.  - Consider checking TSH   Thank you for your consult. We will continue to follow the patient. Please contact [place]  for any questions or concerns.   The above treatment considerations and suggestions are based on consultation with the [facility] specialist and/or PCP and a review of information available in the shared registry and the patient's [place] Record (EHR). I have not personally examined the patient. All recommendations should be implemented with consideration of the patient's relevant prior history and current clinical status. Please feel free to call me with any questions about the care of this patient.

## 2018-01-07 NOTE — Telephone Encounter (Deleted)
[facility] Psychiatric Consultant Case Review   Summary [name] is a 67 y.o. year old female with history of depression, hypertension, hyperlipidemia.  Reports worsening depression with prominent low energy in the context of discord in relationship at church. She reports some drowsiness from antidepressant (she is on citalopram, gabapentin)   Functional Impairment: n/a Psychosocial factors: taking care of her son, age 1 yo with intellectual disability   Current Medications Current Outpatient Medications on File Prior to Visit  Medication Sig Dispense Refill  . amLODipine (NORVASC) 2.5 MG tablet Take 1 tablet (2.5 mg total) by mouth daily. 30 tablet 3  . aspirin EC 81 MG tablet Take 81 mg by mouth daily.    Marland Kitchen azelastine (ASTELIN) 0.1 % nasal spray Place 2 sprays into both nostrils 2 (two) times daily. Use in each nostril as directed 30 mL 1  . citalopram (CELEXA) 40 MG tablet Take 1 tablet (40 mg total) by mouth daily. 90 tablet 1  . gabapentin (NEURONTIN) 300 MG capsule TAKE (1) CAPSULE BY MOUTH TWICE DAILY. 180 capsule 1  . loratadine (CLARITIN) 10 MG tablet TAKE (1) TABLET BY MOUTH ONCE DAILY AS NEEDED FOR ALLERGIES. 90 tablet 1  . montelukast (SINGULAIR) 10 MG tablet Take 1 tablet (10 mg total) by mouth at bedtime. 30 tablet 1  . Omega-3 Fatty Acids (FISH OIL CONCENTRATE) 300 MG CAPS Take 600 mg by mouth daily.    . ondansetron (ZOFRAN ODT) 8 MG disintegrating tablet Take 1 tablet (8 mg total) by mouth every 8 (eight) hours as needed for nausea or vomiting. 20 tablet 0  . pravastatin (PRAVACHOL) 80 MG tablet TAKE ONE TABLET BY MOUTH ONCE DAILY. 90 tablet 0  . ranitidine (ZANTAC) 300 MG tablet Take 1 tablet (300 mg total) by mouth at bedtime. 30 tablet 1  . vitamin C (ASCORBIC ACID) 500 MG tablet Take 500 mg by mouth daily.    . vitamin E 1000 UNIT capsule Take 1,000 Units by mouth daily.    . [DISCONTINUED] oxybutynin (DITROPAN) 5 MG tablet Take 1  tablet (5 mg total) by mouth 2 (two) times daily. 60 tablet 4   No current facility-administered medications on file prior to visit.      Past psychiatry history Outpatient: denies Psychiatry admission: denies  Previous suicide attempt: denies Past trials of medication: citalopram History of violence: denies  Current measures Depression screen [facility] 2/9 01/05/2018 01/01/2018 03/06/2017 10/14/2016 02/01/2016  Decreased Interest 3 3 1  0 2  Down, Depressed, Hopeless 3 3 1  0 2  PHQ - 2 Score 6 6 2  0 4  Altered sleeping 2 2 1  - 2  Tired, decreased energy 2 3 0 - 1  Change in appetite 1 3 0 - 2  Feeling bad or failure about yourself  3 2 1  - 3  Trouble concentrating 0 2 1 - 2  Moving slowly or fidgety/restless 1 0 0 - 2  Suicidal thoughts 0 1 0 - 0  PHQ-9 Score 15 19 5  - 16  Difficult doing work/chores - - Somewhat difficult - -   GAD 7 : Generalized Anxiety Score 01/05/2018  Nervous, Anxious, on Edge 1  Control/stop worrying 1  Worry too much - different things 1  Trouble relaxing 2  Restless 1  Easily annoyed or irritable 0  Afraid - awful might happen 0  Total GAD 7 Score 6    Goals (patient centered) Increase healthy adjustment to current life circumstances  Assessment/Provisional Diagnosis #  MDD, moderate, recurrent without psychotic features Will recommend to switch citalopram to other SSRI to target depression given patient reported side effect and limited benefit under current situation. Will not try SNRI at this time given concern for hypertension. Noted that maximum citalopram dose recommended for patients age > 60 years is 20 mg to avoid risk for QTc prolongation.   Recommendation - Consider cross switching from citalopram to other SSRI, such as sertraline. Would recommend as below.  Week 1. Decrease citalopram 20 mg daily for one week, start sertraline 25 mg daily  Week 2. Discontinue citalopram, increase sertraline 50 mg daily  Week 3. Increase sertraline 100 mg daily.  Consider further uptitration up to 150 mg daily as indicated.  - Consider checking TSH   Thank you for your consult. We will continue to follow the patient. Please contact [place]  for any questions or concerns.   The above treatment considerations and suggestions are based on consultation with the [facility] specialist and/or PCP and a review of information available in the shared registry and the patient's [place] Record (EHR). I have not personally examined the patient. All recommendations should be implemented with consideration of the patient's relevant prior history and current clinical status. Please feel free to call me with any questions about the care of this patient.

## 2018-01-08 ENCOUNTER — Other Ambulatory Visit (HOSPITAL_COMMUNITY): Payer: Self-pay

## 2018-01-22 ENCOUNTER — Telehealth (HOSPITAL_COMMUNITY): Payer: Self-pay

## 2018-01-22 NOTE — Telephone Encounter (Signed)
Mailbox is full - unable to leave a message. 

## 2018-01-23 ENCOUNTER — Telehealth: Payer: Self-pay | Admitting: Family Medicine

## 2018-01-23 DIAGNOSIS — Z780 Asymptomatic menopausal state: Secondary | ICD-10-CM

## 2018-01-23 DIAGNOSIS — Z13820 Encounter for screening for osteoporosis: Secondary | ICD-10-CM

## 2018-01-23 NOTE — Telephone Encounter (Signed)
Need new bone density order

## 2018-01-26 NOTE — Telephone Encounter (Signed)
reordered

## 2018-02-04 ENCOUNTER — Other Ambulatory Visit: Payer: Self-pay | Admitting: Family Medicine

## 2018-02-05 ENCOUNTER — Ambulatory Visit (HOSPITAL_COMMUNITY): Payer: Self-pay

## 2018-02-10 ENCOUNTER — Other Ambulatory Visit: Payer: Self-pay | Admitting: Family Medicine

## 2018-02-10 DIAGNOSIS — E782 Mixed hyperlipidemia: Secondary | ICD-10-CM

## 2018-02-11 ENCOUNTER — Ambulatory Visit (HOSPITAL_COMMUNITY)
Admission: RE | Admit: 2018-02-11 | Discharge: 2018-02-11 | Disposition: A | Discharge status: Home / Self Care | Payer: Self-pay | Source: Ambulatory Visit | Attending: Family Medicine | Admitting: Family Medicine

## 2018-02-11 DIAGNOSIS — Z1231 Encounter for screening mammogram for malignant neoplasm of breast: Secondary | ICD-10-CM | POA: Insufficient documentation

## 2018-02-24 ENCOUNTER — Encounter: Payer: Self-pay | Admitting: Family Medicine

## 2018-02-27 ENCOUNTER — Other Ambulatory Visit: Payer: Self-pay | Admitting: Family Medicine

## 2018-02-27 DIAGNOSIS — F4323 Adjustment disorder with mixed anxiety and depressed mood: Secondary | ICD-10-CM

## 2018-03-02 ENCOUNTER — Ambulatory Visit: Payer: Self-pay | Admitting: Family Medicine

## 2018-03-18 ENCOUNTER — Telehealth: Payer: Self-pay | Admitting: Licensed Clinical Social Worker

## 2018-03-18 NOTE — Telephone Encounter (Signed)
[place] counselor contacted client for outreach and follow up. Voicemail full and unable to leave a message.

## 2018-04-03 ENCOUNTER — Telehealth: Payer: Self-pay | Admitting: Clinical

## 2018-04-03 NOTE — Telephone Encounter (Signed)
This [facility] specialist called to follow up. No answer and voicemail full so unable to leave a voicemail message.

## 2018-04-06 ENCOUNTER — Telehealth: Payer: Self-pay

## 2018-04-06 NOTE — Telephone Encounter (Signed)
This [facility] specialist called to follow up. No answer and voicemail full so unable to leave a voicemail message several times.  Therefore, patient will ben placed on the  Inactive list and information routed to the PCP and Dr. Earl Gala.  A  note placed in the patient chart.

## 2018-04-15 ENCOUNTER — Other Ambulatory Visit: Payer: Self-pay | Admitting: Family Medicine

## 2018-05-19 ENCOUNTER — Other Ambulatory Visit: Payer: Self-pay | Admitting: *Deleted

## 2018-05-19 ENCOUNTER — Telehealth: Payer: Self-pay | Admitting: Family Medicine

## 2018-05-19 MED ORDER — LORATADINE 10 MG PO TABS: ORAL_TABLET | ORAL | 0 refills | Status: DC

## 2018-05-19 MED ORDER — GABAPENTIN 300 MG PO CAPS: ORAL_CAPSULE | ORAL | 1 refills | Status: DC

## 2018-05-19 NOTE — Telephone Encounter (Signed)
Patient is requesting refills for gabapentin (says she wants 30 day supply because the 90day supply is to expensive)  Also requesting loratadine refill  Her pharmacy is [facility] Cb# [phone]

## 2018-05-19 NOTE — Telephone Encounter (Signed)
Refilled medication Rx Gabapentin #30 refill #1                                       Loratadine #90  Refill #0

## 2018-06-02 ENCOUNTER — Telehealth: Payer: Self-pay | Admitting: Family Medicine

## 2018-06-02 ENCOUNTER — Other Ambulatory Visit: Payer: Self-pay

## 2018-06-02 MED ORDER — AMLODIPINE BESYLATE 2.5 MG PO TABS: 2.5000 mg | ORAL_TABLET | Freq: Every day | ORAL | 3 refills | Status: DC

## 2018-06-02 NOTE — Telephone Encounter (Signed)
Done

## 2018-06-02 NOTE — Telephone Encounter (Signed)
Pt needs to have Amlodipine 2.5 mg called in TODAY (she is out) to [facility].

## 2018-06-12 IMAGING — MG DIGITAL SCREENING BILATERAL MAMMOGRAM WITH CAD
4 series · 4 of 4 positions shown · non-contrast
Comparison: Previous exam(s).

CLINICAL DATA: Screening.

EXAM:
DIGITAL SCREENING BILATERAL MAMMOGRAM WITH CAD

[L CC]
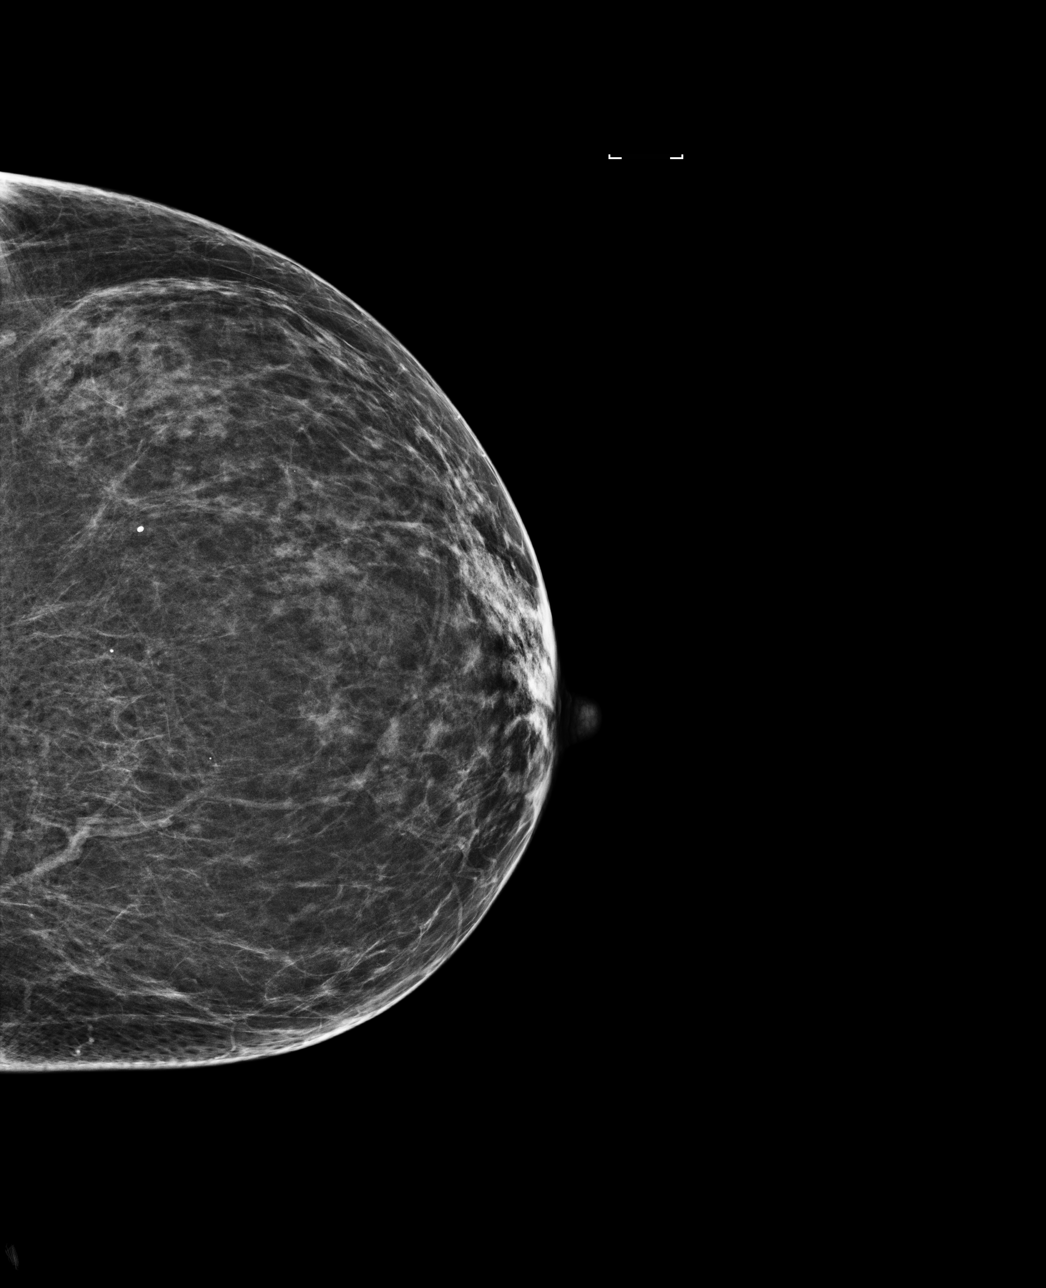

[L MLO]
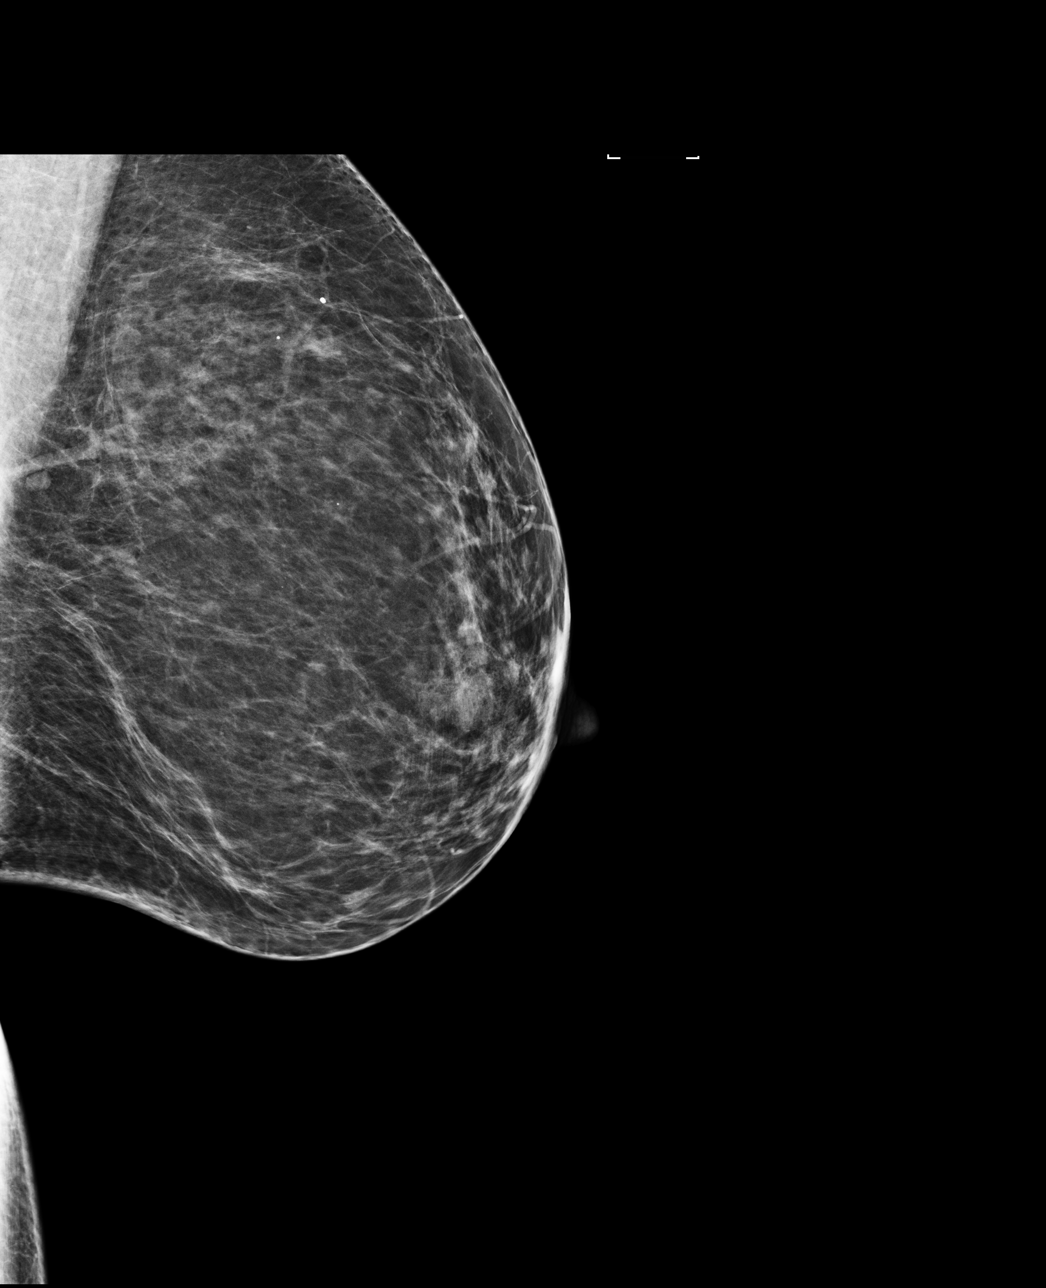

[R MLO]
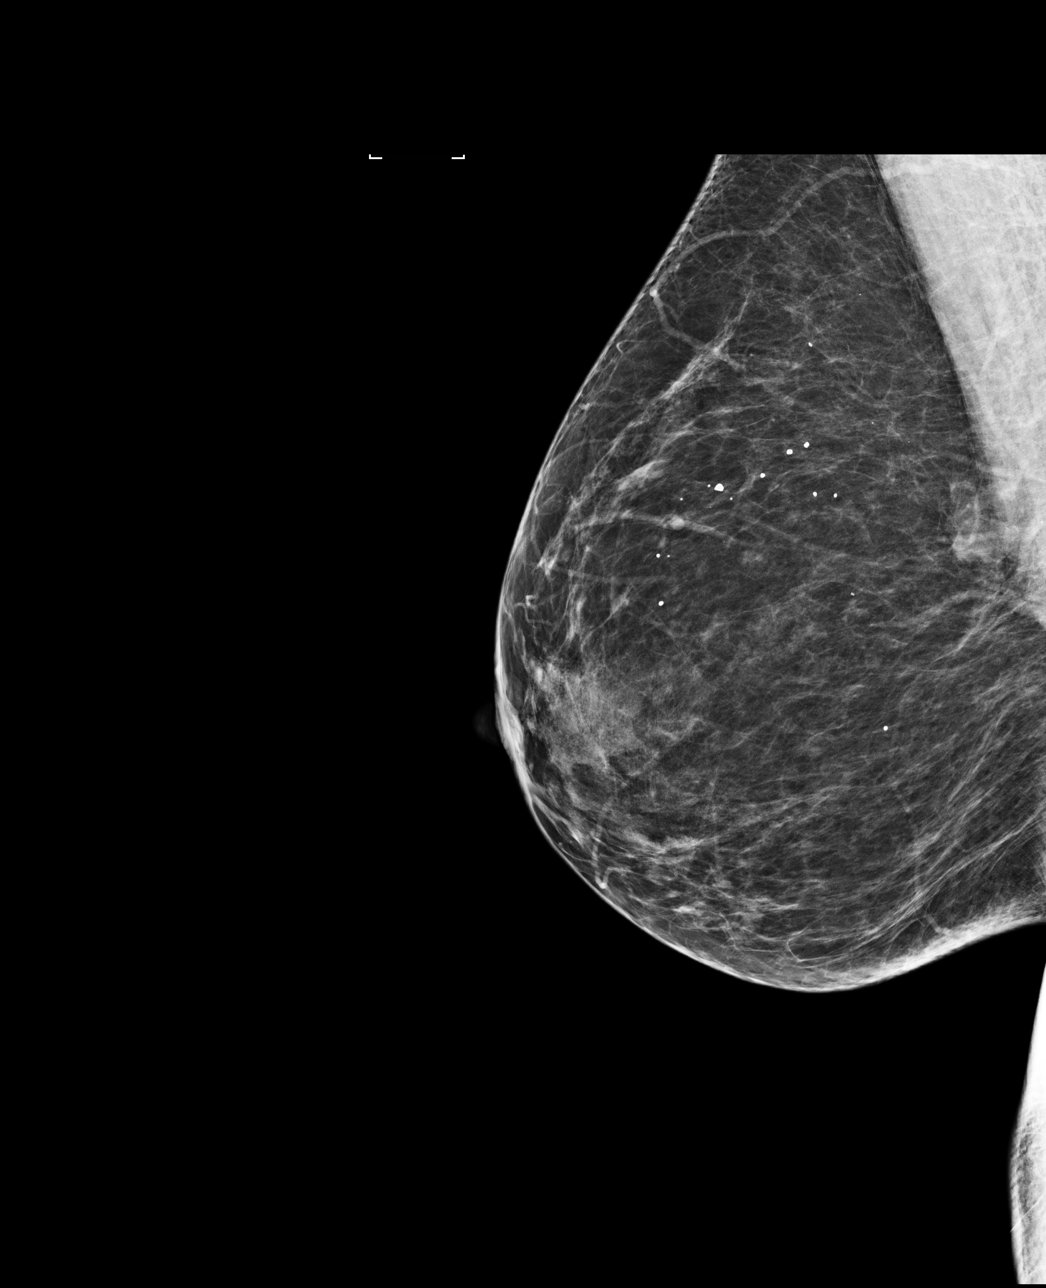

[R CC]
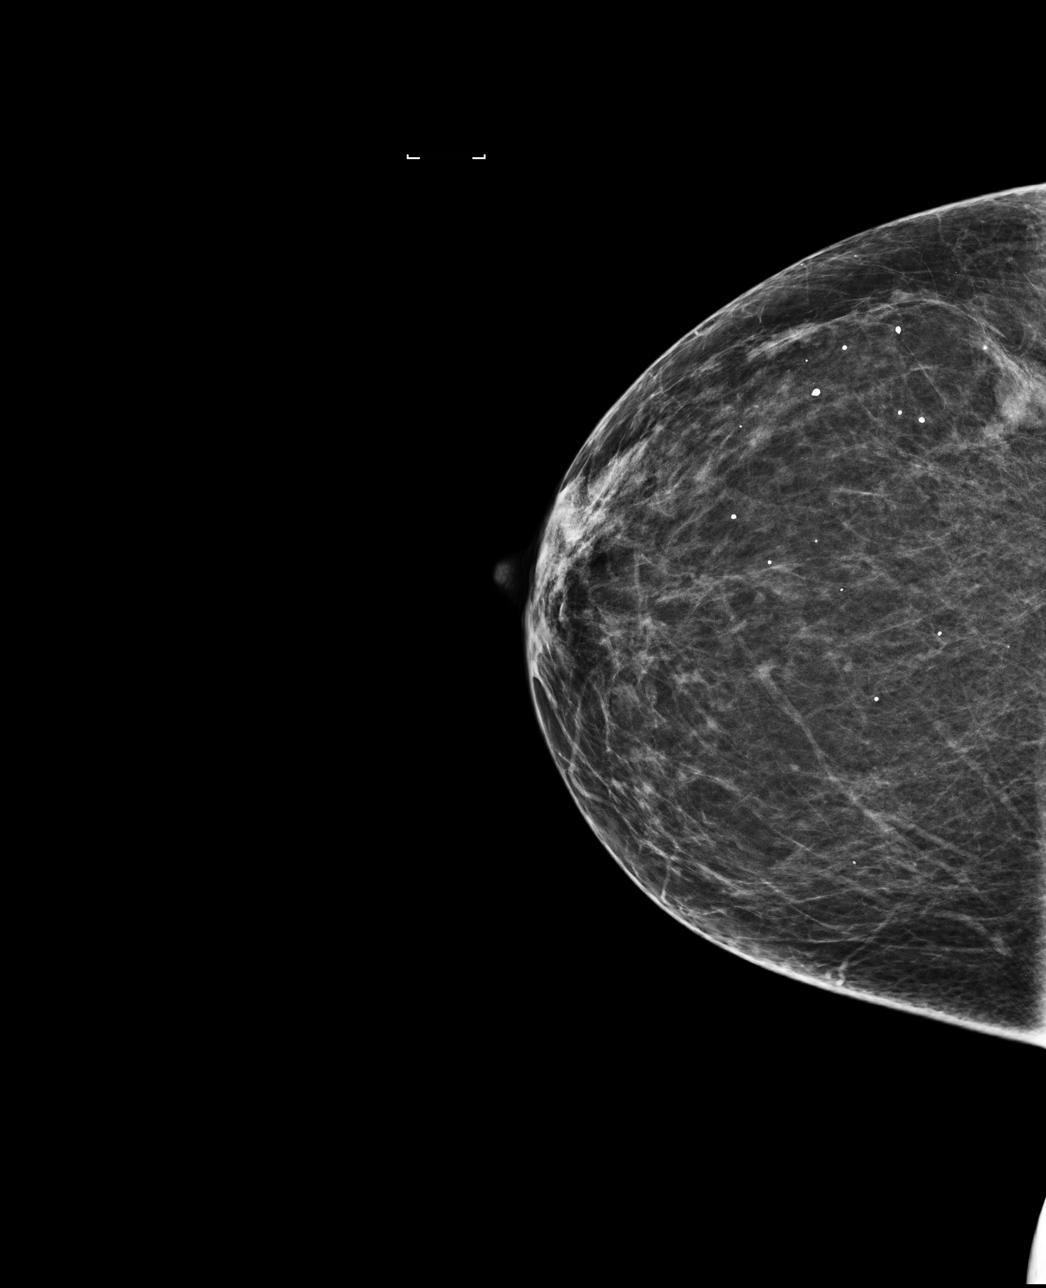

[4 of 4 positions shown; findings below may reference images not displayed]

ACR Breast Density Category b: There are scattered areas of
fibroglandular density.
FINDINGS: There are no findings suspicious for malignancy. Images were
processed with CAD.
IMPRESSION: No mammographic evidence of malignancy. A result letter of this
screening mammogram will be mailed directly to the patient.

RECOMMENDATION:
Screening mammogram in one year. (Code:AS-G-LCT)

BI-RADS CATEGORY  1: Negative.

## 2018-06-25 ENCOUNTER — Ambulatory Visit: Payer: Self-pay | Admitting: Family Medicine

## 2018-07-07 ENCOUNTER — Other Ambulatory Visit: Payer: Self-pay | Admitting: Family Medicine

## 2018-07-08 ENCOUNTER — Ambulatory Visit: Payer: Self-pay | Admitting: Family Medicine

## 2018-07-09 ENCOUNTER — Telehealth: Payer: Self-pay | Admitting: Family Medicine

## 2018-07-09 NOTE — Telephone Encounter (Signed)
This was done.

## 2018-07-09 NOTE — Telephone Encounter (Signed)
Pt stopped by--needs Gabapentin called in to [facility]

## 2018-07-23 ENCOUNTER — Other Ambulatory Visit: Payer: Self-pay | Admitting: Family Medicine

## 2018-07-23 DIAGNOSIS — E782 Mixed hyperlipidemia: Secondary | ICD-10-CM

## 2018-08-26 ENCOUNTER — Telehealth: Payer: Self-pay | Admitting: Family Medicine

## 2018-08-26 NOTE — Telephone Encounter (Signed)
PT needs her medication refilled.

## 2018-08-27 ENCOUNTER — Other Ambulatory Visit: Payer: Self-pay | Admitting: Family Medicine

## 2018-08-27 DIAGNOSIS — F4323 Adjustment disorder with mixed anxiety and depressed mood: Secondary | ICD-10-CM

## 2018-08-27 NOTE — Telephone Encounter (Signed)
Will have to wait until she comes in to her appt on Nov 6. She has no showed/cancelled 5 appts since she should have followed up in April 2019

## 2018-09-02 ENCOUNTER — Ambulatory Visit: Payer: Self-pay | Admitting: Family Medicine

## 2018-09-07 ENCOUNTER — Encounter: Payer: Self-pay | Admitting: Family Medicine

## 2018-09-07 ENCOUNTER — Ambulatory Visit (INDEPENDENT_AMBULATORY_CARE_PROVIDER_SITE_OTHER): Payer: Self-pay | Admitting: Family Medicine

## 2018-09-07 VITALS — BP 140/80 | HR 86 | Resp 12 | Ht 62.0 in | Wt 171.0 lb

## 2018-09-07 DIAGNOSIS — M549 Dorsalgia, unspecified: Secondary | ICD-10-CM

## 2018-09-07 DIAGNOSIS — Z23 Encounter for immunization: Secondary | ICD-10-CM

## 2018-09-07 DIAGNOSIS — Z1211 Encounter for screening for malignant neoplasm of colon: Secondary | ICD-10-CM

## 2018-09-07 DIAGNOSIS — I10 Essential (primary) hypertension: Secondary | ICD-10-CM

## 2018-09-07 DIAGNOSIS — E785 Hyperlipidemia, unspecified: Secondary | ICD-10-CM

## 2018-09-07 DIAGNOSIS — F4323 Adjustment disorder with mixed anxiety and depressed mood: Secondary | ICD-10-CM

## 2018-09-07 DIAGNOSIS — R7303 Prediabetes: Secondary | ICD-10-CM

## 2018-09-07 DIAGNOSIS — F411 Generalized anxiety disorder: Secondary | ICD-10-CM

## 2018-09-07 MED ORDER — TRAMADOL HCL 50 MG PO TABS: ORAL_TABLET | ORAL | 0 refills | Status: DC

## 2018-09-07 MED ORDER — AMLODIPINE BESYLATE 5 MG PO TABS: 5.0000 mg | ORAL_TABLET | Freq: Every day | ORAL | 3 refills | Status: DC

## 2018-09-07 NOTE — Assessment & Plan Note (Signed)
Uncontrolled, increase amlodipine to 5 mg DASH diet and commitment to daily physical activity for a minimum of 30 minutes discussed and encouraged, as a part of hypertension management. The importance of attaining a healthy weight is also discussed.  BP/Weight 09/07/2018 01/01/2018 04/17/2017 04/09/2017 04/04/2017 03/21/2017 12/08/1550  Systolic BP 080 223 361 224 497 530 051  Diastolic BP 80 82 80 58 83 70 79  Wt. (Lbs) 171.04 167 159.4 162 162 162 160  BMI 31.28 29.58 28.24 28.7 28.7 28.7 28.34

## 2018-09-07 NOTE — Assessment & Plan Note (Signed)
After obtaining informed consent, the vaccine is  administered by LPN.  

## 2018-09-07 NOTE — Assessment & Plan Note (Signed)
Controlled, no change in medication  

## 2018-09-07 NOTE — Patient Instructions (Signed)
Wellness past due with nurse please schedule  Flu vaccine and Pneumonia 23  vaccine today at visit   Blood pressure is high, new dose of amlodipine , start tomorrow, is 5 mg one daily  MD follow up in 4 months, call if you need me before  Please schedule bone density at checkout, already ordered  Back pain is from your recent fall. Continue gabapentin and tylenol Tramadol one at bedtime is  Added   Cologuard to be set up from visit today  Labs fasting at quest in am CBC, cmp and eGFR, lipid, HBA1C, collect form at checkout   Please reduce fried and fatty  Foods and sweets  Thank you  for choosing [facility]. We consider it a privelige to serve you.  Delivering excellent health care in a caring and  compassionate way is our goal.  Partnering with you,  so that together we can achieve this goal is our strategy.

## 2018-09-07 NOTE — Assessment & Plan Note (Signed)
Increased back pain following recent fall, add tramadol at bedtime , short term

## 2018-09-07 NOTE — Progress Notes (Signed)
   [name]     MRN: [id number]      DOB: Jul 03, 1951   HPI [name] is here for follow up and re-evaluation of chronic medical conditions, medication management and review of any available recent lab and radiology data.  Preventive health is updated, specifically  Cancer screening and Immunization.   Questions or concerns regarding consultations or procedures which the PT has had in the interim are  addressed. The PT denies any adverse reactions to current medications since the last visit.  Fell 2 weeks ago an d has 8 pain  In lower chest , worse with movement and breathing, denies hemoptysis  ROS Denies recent fever or chills. Denies sinus pressure, nasal congestion, ear pain or sore throat. Denies chest congestion, productive cough or wheezing. Denies chest pains, palpitations and leg swelling Denies abdominal pain, nausea, vomiting,diarrhea or constipation.   Denies dysuria, frequency, hesitancy or incontinence. Denies headaches, seizures, numbness, or tingling. Denies depression, anxiety or insomnia. Denies skin break down or rash.   PE  BP 140/80   Pulse 86   Resp 12   Ht 5\' 2"  (1.575 m)   Wt 171 lb 0.6 oz (77.6 kg)   SpO2 97% Comment: room air  BMI 31.28 kg/m   Patient alert and oriented and in no cardiopulmonary distress.  HEENT: No facial asymmetry, EOMI,   oropharynx pink and moist.  Neck supple no JVD, no mass.  Chest: Clear to auscultation bilaterally.  CVS: S1, S2 no murmurs, no S3.Regular rate.  ABD: Soft non tender.   Ext: No edema  MS: Adequate ROM lumbar  Spine,reduced in thoracic spine, adequate in  shoulders, hips and knees.  Skin: Intact, no ulcerations or rash noted.  Psych: Good eye contact, normal affect. Memory intact not anxious or depressed appearing.  CNS: CN 2-12 intact, power,  normal throughout.no focal deficits noted.   Assessment & Plan  Essential hypertension Uncontrolled, increase amlodipine to 5 mg DASH diet and commitment  to daily physical activity for a minimum of 30 minutes discussed and encouraged, as a part of hypertension management. The importance of attaining a healthy weight is also discussed.  BP/Weight 09/07/2018 01/01/2018 04/17/2017 04/09/2017 04/04/2017 03/21/2017 8/50/2774  Systolic BP 128 786 [phone] 836  Diastolic BP 80 82 80 58 83 70 79  Wt. (Lbs) 171.04 167 159.4 162 162 162 160  BMI 31.28 29.58 28.24 28.7 28.7 28.7 28.34       Back pain with radiation Increased back pain following recent fall, add tramadol at bedtime , short term  GAD (generalized anxiety disorder) Controlled, no change in medication   Adjustment reaction with anxiety and depression Controlled, no change in medication   Need for immunization against influenza After obtaining informed consent, the vaccine is  administered by LPN.   Need for vaccination against Streptococcus pneumoniae After obtaining informed consent, the vaccine is  administered by LPN.

## 2018-09-16 ENCOUNTER — Ambulatory Visit (HOSPITAL_COMMUNITY)
Admission: RE | Admit: 2018-09-16 | Discharge: 2018-09-16 | Disposition: A | Discharge status: Home / Self Care | Payer: Self-pay | Source: Ambulatory Visit | Attending: Family Medicine | Admitting: Family Medicine

## 2018-09-16 DIAGNOSIS — Z780 Asymptomatic menopausal state: Secondary | ICD-10-CM | POA: Insufficient documentation

## 2018-09-16 DIAGNOSIS — Z13820 Encounter for screening for osteoporosis: Secondary | ICD-10-CM | POA: Insufficient documentation

## 2018-09-21 ENCOUNTER — Ambulatory Visit (INDEPENDENT_AMBULATORY_CARE_PROVIDER_SITE_OTHER): Payer: Self-pay

## 2018-09-21 VITALS — BP 139/80 | HR 86 | Resp 12 | Ht 62.0 in | Wt 169.0 lb

## 2018-09-21 DIAGNOSIS — Z0000 Encounter for general adult medical examination without abnormal findings: Secondary | ICD-10-CM

## 2018-09-21 NOTE — Progress Notes (Signed)
Subjective:   [name] is a 67 y.o. female who presents for Medicare Annual (Subsequent) preventive examination.  Review of Systems:   Cardiac Risk Factors include: advanced age (>34mn, >>53women);hypertension;dyslipidemia;obesity (BMI >30kg/m2)     Objective:     Vitals: BP 139/80   Pulse 86   Resp 12   Ht _0  (1.575 m)   Wt 169 lb (76.7 kg)   SpO2 96%   BMI 30.91 kg/m   Body mass index is 30.91 kg/m.  Advanced Directives 09/21/2018 04/04/2017 03/19/2017 09/11/2014  Does Patient Have a Medical Advance Directive? Yes;No No No No  Does patient want to make changes to medical advance directive? Yes (ED - Information included in AVS) - - -  Would patient like information on creating a medical advance directive? No - Patient declined No - Patient declined - No - patient declined information    Tobacco Social History   Tobacco Use  Smoking Status Never Smoker  Smokeless Tobacco Never Used     Counseling given: Not Answered   Clinical Intake:  Pre-visit preparation completed: Yes  Pain : No/denies pain Pain Score: 0-No pain     BMI - recorded: 30.9 Nutritional Status: BMI > 30  Obese Nutritional Risks: None Diabetes: No  How often do you need to have someone help you when you read instructions, pamphlets, or other written materials from your doctor or pharmacy?: 1 - Never What is the last grade level you completed in school?: 12 grade   Interpreter Needed?: No  Information entered by :: AFrancena HanlyLPN   Past Medical History:  Diagnosis Date  . Allergy   . Anxiety   . GERD (gastroesophageal reflux disease)   . Hyperlipidemia   . Osteoarthritis    Past Surgical History:  Procedure Laterality Date  . cyst removed from right breast    . LAPAROSCOPIC SALPINGO OOPHERECTOMY Right 04/09/2017   Procedure: LAPAROSCOPIC RIGHT SALPINGO OOPHORECTOMY; RIGHT OVARIAN BENIGN CYSTIC TERATOMA;  Surgeon: EFlorian Buff MD;  Location: [facility];  Service: Gynecology;   Laterality: Right;  . PARTIAL HYSTERECTOMY    . TUBAL LIGATION     Family History  Problem Relation Age of Onset  . Heart disease Mother   . Heart disease Father   . Diabetes Father   . Hypertension Sister   . Parkinson's disease Brother   . Heart disease Maternal Grandmother   . Cancer Paternal Grandmother        breast  . Diabetes Paternal Grandfather   . Hypertension Sister   . Hypertension Sister   . Down syndrome Son   . Eczema Son   . Post-traumatic stress disorder Daughter    Social History   Socioeconomic History  . Marital status: Single    Spouse name: Not on file  . Number of children: 3  . Years of education: 131 . Highest education level: 12th grade  Occupational History  . Not on file  Social Needs  . Financial resource strain: Somewhat hard  . Food insecurity:    Worry: Sometimes true    Inability: Sometimes true  . Transportation needs:    Medical: No    Non-medical: No  Tobacco Use  . Smoking status: Never Smoker  . Smokeless tobacco: Never Used  Substance and Sexual Activity  . Alcohol use: Yes    Comment: occasionally wine  . Drug use: No  . Sexual activity: Not Currently    Birth control/protection: Surgical  Comment: hyst  Lifestyle  . Physical activity:    Days per week: 7 days    Minutes per session: 20 min  . Stress: Only a little  Relationships  . Social connections:    Talks on phone: More than three times a week    Gets together: Once a week    Attends religious service: More than 4 times per year    Active member of club or organization: Yes    Attends meetings of clubs or organizations: More than 4 times per year    Relationship status: Divorced  Other Topics Concern  . Not on file  Social History Narrative  . Not on file    Outpatient Encounter Medications as of 09/21/2018  Medication Sig  . amLODipine (NORVASC) 5 MG tablet Take 1 tablet (5 mg total) by mouth daily.  Marland Kitchen aspirin EC 81 MG tablet Take 81 mg by mouth  daily.  Marland Kitchen azelastine (ASTELIN) 0.1 % nasal spray Place 2 sprays into both nostrils 2 (two) times daily. Use in each nostril as directed  . citalopram (CELEXA) 40 MG tablet TAKE ONE TABLET BY MOUTH ONCE DAILY.  Marland Kitchen gabapentin (NEURONTIN) 300 MG capsule TAKE (1) CAPSULE BY MOUTH TWICE DAILY.  Marland Kitchen loratadine (CLARITIN) 10 MG tablet TAKE (1) TABLET BY MOUTH ONCE DAILY AS NEEDED FOR ALLERGIES.  Marland Kitchen montelukast (SINGULAIR) 10 MG tablet Take 1 tablet (10 mg total) by mouth at bedtime.  . Omega-3 Fatty Acids (FISH OIL CONCENTRATE) 300 MG CAPS Take 600 mg by mouth daily.  . pravastatin (PRAVACHOL) 80 MG tablet TAKE ONE TABLET BY MOUTH ONCE DAILY.  . ranitidine (ZANTAC) 300 MG tablet TAKE (1) TABLET BY MOUTH AT BEDTIME.  . traMADol (ULTRAM) 50 MG tablet One tablet at bedtime for back pain  . vitamin C (ASCORBIC ACID) 500 MG tablet Take 500 mg by mouth daily.  . vitamin E 1000 UNIT capsule Take 1,000 Units by mouth daily.  . [DISCONTINUED] oxybutynin (DITROPAN) 5 MG tablet Take 1 tablet (5 mg total) by mouth 2 (two) times daily.   No facility-administered encounter medications on file as of 09/21/2018.     Activities of Daily Living In your present state of health, do you have any difficulty performing the following activities: 09/21/2018  Hearing? N  Vision? N  Difficulty concentrating or making decisions? Y  Walking or climbing stairs? N  Dressing or bathing? N  Doing errands, shopping? N  Preparing Food and eating ? N  Using the Toilet? N  In the past six months, have you accidently leaked urine? Y  Do you have problems with loss of bowel control? N  Managing your Medications? N  Managing your Finances? N  Housekeeping or managing your Housekeeping? N  Some recent data might be hidden    Patient Care Team: Fayrene Helper, MD as PCP - General    Assessment:   This is a routine wellness examination for [name].  Exercise Activities and Dietary recommendations Current Exercise Habits: Home  exercise routine, Type of exercise: walking, Time (Minutes): 20, Frequency (Times/Week): 7, Weekly Exercise (Minutes/Week): 140, Intensity: Mild, Exercise limited by: orthopedic condition(s)  Goals    . Increase physical activity       Fall Risk Fall Risk  09/21/2018 09/07/2018 03/06/2017 10/14/2016  Falls in the past year? 1 1 No No  Number falls in past yr: 0 0 - -  Injury with Fall? 1 1 - -  Risk for fall due to : Medication side effect;Impaired  balance/gait - - -   Is the patient's home free of loose throw rugs in walkways, pet beds, electrical cords, etc?   no      Grab bars in the bathroom? yes      Handrails on the stairs?   yes      Adequate lighting?   yes  Timed Get Up and Go performed: Patient able to perform in 5 seconds without assistance   Depression Screen PHQ 2/9 Scores 09/21/2018 09/07/2018 01/05/2018 01/01/2018  PHQ - 2 Score 0 0 6 6  PHQ- 9 Score - - 15 19     Cognitive Function     6CIT Screen 09/21/2018  What Year? 0 points  What month? 0 points  What time? 0 points  Count back from 20 0 points  Months in reverse 0 points  Repeat phrase 2 points  Total Score 2    Immunization History  Administered Date(s) Administered  . Influenza Split 07/13/2012, 08/13/2016  . Influenza, High Dose Seasonal PF 09/07/2018  . Pneumococcal Conjugate-13 03/06/2017  . Pneumococcal Polysaccharide-23 09/08/2018  . Td 05/17/2008  . Zoster 07/13/2012    Qualifies for Shingles Vaccine? Up to date   Screening Tests Health Maintenance  Topic Date Due  . TETANUS/TDAP  05/17/2018  . COLONOSCOPY  08/02/2018  . MAMMOGRAM  02/12/2020  . INFLUENZA VACCINE  Completed  . DEXA SCAN  Completed  . Hepatitis C Screening  Completed  . PNA vac Low Risk Adult  Completed    Cancer Screenings: Lung: Low Dose CT Chest recommended if Age 63-80 years, 30 pack-year currently smoking OR have quit w/in 15years. Patient does not qualify. Breast:  Up to date on Mammogram? Yes   Up to  date of Bone Density/Dexa? Yes Colorectal: has kit at  Home   Additional Screenings:  Hepatitis C Screening: Complete      Plan:   Increase physical activity    I have personally reviewed and noted the following in the patient's chart:   . Medical and social history . Use of alcohol, tobacco or illicit drugs  . Current medications and supplements . Functional ability and status . Nutritional status . Physical activity . Advanced directives . List of other physicians . Hospitalizations, surgeries, and ER visits in previous 12 months . Vitals . Screenings to include cognitive, depression, and falls . Referrals and appointments  In addition, I have reviewed and discussed with patient certain preventive protocols, quality metrics, and best practice recommendations. A written personalized care plan for preventive services as well as general preventive health recommendations were provided to patient.     Hayden Pedro, LPN  09/81/1914

## 2018-09-21 NOTE — Patient Instructions (Signed)
[name] , Thank you for taking time to come for your Medicare Wellness Visit. I appreciate your ongoing commitment to your health goals. Please review the following plan we discussed and let me know if I can assist you in the future.   Screening recommendations/referrals: Colonoscopy: has kit at home  Mammogram: up to date  Bone Density: up to date  Recommended yearly ophthalmology/optometry visit for glaucoma screening and checkup Recommended yearly dental visit for hygiene and checkup  Vaccinations: Influenza vaccine: up to date  Pneumococcal vaccine: up to date  Tdap vaccine: up to date  Shingles vaccine: up to date     Advanced directives: information given   Conditions/risks identified: hypertension, hyperlipidemia, osteoarthritis   Next appointment: Wellness in one year    Preventive Care 81 Years and Older, Female Preventive care refers to lifestyle choices and visits with your health care provider that can promote health and wellness. What does preventive care include?  A yearly physical exam. This is also called an annual well check.  Dental exams once or twice a year.  Routine eye exams. Ask your health care provider how often you should have your eyes checked.  Personal lifestyle choices, including:  Daily care of your teeth and gums.  Regular physical activity.  Eating a healthy diet.  Avoiding tobacco and drug use.  Limiting alcohol use.  Practicing safe sex.  Taking low-dose aspirin every day.  Taking vitamin and mineral supplements as recommended by your health care provider. What happens during an annual well check? The services and screenings done by your health care provider during your annual well check will depend on your age, overall health, lifestyle risk factors, and family history of disease. Counseling  Your health care provider may ask you questions about your:  Alcohol use.  Tobacco use.  Drug use.  Emotional well-being.  Home  and relationship well-being.  Sexual activity.  Eating habits.  History of falls.  Memory and ability to understand (cognition).  Work and work Statistician.  Reproductive health. Screening  You may have the following tests or measurements:  Height, weight, and BMI.  Blood pressure.  Lipid and cholesterol levels. These may be checked every 5 years, or more frequently if you are over 56 years old.  Skin check.  Lung cancer screening. You may have this screening every year starting at age 69 if you have a 30-pack-year history of smoking and currently smoke or have quit within the past 15 years.  Fecal occult blood test (FOBT) of the stool. You may have this test every year starting at age 70.  Flexible sigmoidoscopy or colonoscopy. You may have a sigmoidoscopy every 5 years or a colonoscopy every 10 years starting at age 91.  Hepatitis C blood test.  Hepatitis B blood test.  Sexually transmitted disease (STD) testing.  Diabetes screening. This is done by checking your blood sugar (glucose) after you have not eaten for a while (fasting). You may have this done every 1-3 years.  Bone density scan. This is done to screen for osteoporosis. You may have this done starting at age 78.  Mammogram. This may be done every 1-2 years. Talk to your health care provider about how often you should have regular mammograms. Talk with your health care provider about your test results, treatment options, and if necessary, the need for more tests. Vaccines  Your health care provider may recommend certain vaccines, such as:  Influenza vaccine. This is recommended every year.  Tetanus, diphtheria, and  acellular pertussis (Tdap, Td) vaccine. You may need a Td booster every 10 years.  Zoster vaccine. You may need this after age 3.  Pneumococcal 13-valent conjugate (PCV13) vaccine. One dose is recommended after age 5.  Pneumococcal polysaccharide (PPSV23) vaccine. One dose is recommended  after age 89. Talk to your health care provider about which screenings and vaccines you need and how often you need them. This information is not intended to replace advice given to you by your health care provider. Make sure you discuss any questions you have with your health care provider. Document Released: 11/10/2015 Document Revised: 07/03/2016 Document Reviewed: 08/15/2015 Elsevier Interactive Patient Education  2017 [place] Prevention in the Home Falls can cause injuries. They can happen to people of all ages. There are many things you can do to make your home safe and to help prevent falls. What can I do on the outside of my home?  Regularly fix the edges of walkways and driveways and fix any cracks.  Remove anything that might make you trip as you walk through a door, such as a raised step or threshold.  Trim any bushes or trees on the path to your home.  Use bright outdoor lighting.  Clear any walking paths of anything that might make someone trip, such as rocks or tools.  Regularly check to see if handrails are loose or broken. Make sure that both sides of any steps have handrails.  Any raised decks and porches should have guardrails on the edges.  Have any leaves, snow, or ice cleared regularly.  Use sand or salt on walking paths during winter.  Clean up any spills in your garage right away. This includes oil or grease spills. What can I do in the bathroom?  Use night lights.  Install grab bars by the toilet and in the tub and shower. Do not use towel bars as grab bars.  Use non-skid mats or decals in the tub or shower.  If you need to sit down in the shower, use a plastic, non-slip stool.  Keep the floor dry. Clean up any water that spills on the floor as soon as it happens.  Remove soap buildup in the tub or shower regularly.  Attach bath mats securely with double-sided non-slip rug tape.  Do not have throw rugs and other things on the floor  that can make you trip. What can I do in the bedroom?  Use night lights.  Make sure that you have a light by your bed that is easy to reach.  Do not use any sheets or blankets that are too big for your bed. They should not hang down onto the floor.  Have a firm chair that has side arms. You can use this for support while you get dressed.  Do not have throw rugs and other things on the floor that can make you trip. What can I do in the kitchen?  Clean up any spills right away.  Avoid walking on wet floors.  Keep items that you use a lot in easy-to-reach places.  If you need to reach something above you, use a strong step stool that has a grab bar.  Keep electrical cords out of the way.  Do not use floor polish or wax that makes floors slippery. If you must use wax, use non-skid floor wax.  Do not have throw rugs and other things on the floor that can make you trip. What can I do with my stairs?  Do not leave any items on the stairs.  Make sure that there are handrails on both sides of the stairs and use them. Fix handrails that are broken or loose. Make sure that handrails are as long as the stairways.  Check any carpeting to make sure that it is firmly attached to the stairs. Fix any carpet that is loose or worn.  Avoid having throw rugs at the top or bottom of the stairs. If you do have throw rugs, attach them to the floor with carpet tape.  Make sure that you have a light switch at the top of the stairs and the bottom of the stairs. If you do not have them, ask someone to add them for you. What else can I do to help prevent falls?  Wear shoes that:  Do not have high heels.  Have rubber bottoms.  Are comfortable and fit you well.  Are closed at the toe. Do not wear sandals.  If you use a stepladder:  Make sure that it is fully opened. Do not climb a closed stepladder.  Make sure that both sides of the stepladder are locked into place.  Ask someone to hold it  for you, if possible.  Clearly mark and make sure that you can see:  Any grab bars or handrails.  First and last steps.  Where the edge of each step is.  Use tools that help you move around (mobility aids) if they are needed. These include:  Canes.  Walkers.  Scooters.  Crutches.  Turn on the lights when you go into a dark area. Replace any light bulbs as soon as they burn out.  Set up your furniture so you have a clear path. Avoid moving your furniture around.  If any of your floors are uneven, fix them.  If there are any pets around you, be aware of where they are.  Review your medicines with your doctor. Some medicines can make you feel dizzy. This can increase your chance of falling. Ask your doctor what other things that you can do to help prevent falls. This information is not intended to replace advice given to you by your health care provider. Make sure you discuss any questions you have with your health care provider. Document Released: 08/10/2009 Document Revised: 03/21/2016 Document Reviewed: 11/18/2014 Elsevier Interactive Patient Education  2017 [employer].

## 2018-09-25 ENCOUNTER — Other Ambulatory Visit: Payer: Self-pay | Admitting: Family Medicine

## 2018-09-25 DIAGNOSIS — F4323 Adjustment disorder with mixed anxiety and depressed mood: Secondary | ICD-10-CM

## 2018-12-07 ENCOUNTER — Other Ambulatory Visit: Payer: Self-pay | Admitting: Family Medicine

## 2018-12-30 ENCOUNTER — Other Ambulatory Visit: Payer: Self-pay | Admitting: Family Medicine

## 2018-12-30 ENCOUNTER — Other Ambulatory Visit: Payer: Self-pay

## 2018-12-30 MED ORDER — GABAPENTIN 300 MG PO CAPS: ORAL_CAPSULE | ORAL | 3 refills | Status: DC

## 2019-01-12 ENCOUNTER — Other Ambulatory Visit: Payer: Self-pay

## 2019-01-12 MED ORDER — AMLODIPINE BESYLATE 5 MG PO TABS: 5.0000 mg | ORAL_TABLET | Freq: Every day | ORAL | 3 refills | Status: DC

## 2019-01-19 ENCOUNTER — Ambulatory Visit: Payer: Self-pay | Admitting: Family Medicine

## 2019-02-09 ENCOUNTER — Other Ambulatory Visit: Payer: Self-pay

## 2019-02-09 ENCOUNTER — Telehealth: Payer: Self-pay | Admitting: *Deleted

## 2019-02-09 DIAGNOSIS — E782 Mixed hyperlipidemia: Secondary | ICD-10-CM

## 2019-02-09 MED ORDER — AZELASTINE HCL 0.1 % NA SOLN: 2.0000 | Freq: Two times a day (BID) | NASAL | 5 refills | Status: DC

## 2019-02-09 MED ORDER — PRAVASTATIN SODIUM 80 MG PO TABS: 80.0000 mg | ORAL_TABLET | Freq: Every day | ORAL | 5 refills | Status: DC

## 2019-02-09 NOTE — Telephone Encounter (Signed)
Refills sent

## 2019-02-09 NOTE — Telephone Encounter (Signed)
Refill request received via fax from [facility] for prevastatin 80 mg and azelastine 0.1 % nose spray

## 2019-02-16 ENCOUNTER — Ambulatory Visit (INDEPENDENT_AMBULATORY_CARE_PROVIDER_SITE_OTHER): Payer: Self-pay | Admitting: Family Medicine

## 2019-02-16 ENCOUNTER — Encounter: Payer: Self-pay | Admitting: Family Medicine

## 2019-02-16 ENCOUNTER — Ambulatory Visit: Payer: Self-pay | Admitting: Family Medicine

## 2019-02-16 ENCOUNTER — Other Ambulatory Visit: Payer: Self-pay

## 2019-02-16 DIAGNOSIS — J302 Other seasonal allergic rhinitis: Secondary | ICD-10-CM

## 2019-02-16 MED ORDER — LEVOCETIRIZINE DIHYDROCHLORIDE 5 MG PO TABS: 5.0000 mg | ORAL_TABLET | Freq: Every evening | ORAL | 3 refills | Status: DC

## 2019-02-16 MED ORDER — FLUTICASONE PROPIONATE 50 MCG/ACT NA SUSP: 2.0000 | Freq: Every day | NASAL | 2 refills | Status: DC

## 2019-02-16 NOTE — Progress Notes (Signed)
Virtual Visit via Telephone Note  I connected with [name] on 02/16/19 at  3:00 PM EDT by telephone and verified that I am speaking with the correct person using two identifiers.   I discussed the limitations, risks, security and privacy concerns of performing an evaluation and management service by telephone and the availability of in person appointments. I also discussed with the patient that there may be a patient responsible charge related to this service. The patient expressed understanding and agreed to proceed. Location of Patient: Home Location of Provider: Telehealth Consent was obtain for visit to be over via telehealth.  History of Present Illness: [name] is a 68 year old female patient of Dr Simpson's. She presents today with sinus trouble. She reports ongoing sinus issues for several weeks now. Stuff nose, headache, mild ear pain, some facial discomfort. She is on Claritin and Astelin nasal spray. She is not having much relief with these. She reports she gets worse when going outside. Also reports some falls 2 weeks ago which she thinks is related. Drainage is clear in nature, but "has some mucus in it."  Past Medical, Surgical, Social History, Allergies, and Medications have been Reviewed.    Review of Systems  Constitutional: Negative for activity change, appetite change, chills and fever.  HENT: Positive for congestion, ear pain, postnasal drip, rhinorrhea and sneezing. Negative for sore throat and voice change.   Eyes: Negative.   Respiratory: Negative for cough, chest tightness, shortness of breath and wheezing.   Cardiovascular: Negative for chest pain, palpitations and leg swelling.  Gastrointestinal: Negative.   Genitourinary: Negative.   Musculoskeletal: Negative.   Skin: Negative.   Allergic/Immunologic: Positive for environmental allergies.  Neurological: Positive for headaches. Negative for dizziness.  Hematological: Negative.   Psychiatric/Behavioral:  Negative.   All other systems reviewed and are negative.   observations/Objective: Physical Exam Neurological:     Mental Status: She is alert and oriented to person, place, and time.  Psychiatric:        Attention and Perception: Attention normal.        Mood and Affect: Mood normal.        Speech: Speech normal.        Behavior: Behavior normal. Behavior is cooperative.        Thought Content: Thought content normal.        Cognition and Memory: Cognition normal.        Judgment: Judgment normal.     Comments: Good communication over phone call      Assessment and Plan: 1. Seasonal allergies not controlled with current medications. Will adjust and change.  Stopping claritin and starting xyzal. Will add Flonas spray as well. Looking to try and clear drainage from her face and ears. If she improves will continue these medications. If she does not improve or fully resolve will need to bring her in and check in her ears and work up for ear infection, effusion, possible sinus infection. Follow up in 2 weeks to check in.   - fluticasone (FLONASE) 50 MCG/ACT nasal spray; Place 2 sprays into both nostrils daily. Can do in the morning, or do one spray in more and one at night.  Dispense: 16 g; Refill: 2 - levocetirizine (XYZAL) 5 MG tablet; Take 1 tablet (5 mg total) by mouth every evening.  Dispense: 90 tablet; Refill: 3   Follow Up Instructions: AVS printed and mailed    I discussed the assessment and treatment plan with the patient. The  patient was provided an opportunity to ask questions and all were answered. The patient agreed with the plan and demonstrated an understanding of the instructions.   The patient was advised to call back or seek an in-person evaluation if the symptoms worsen or if the condition fails to improve as anticipated.  I provided 10 minutes of non-face-to-face time during this encounter.   Perlie Mayo, NP

## 2019-02-16 NOTE — Patient Instructions (Addendum)
    Thank you for completing your office visit today via telephone. I appreciate the opportunity to provide you with the care for your health and wellness. Today we discussed: sinus and allergies.  I have changed some of your medications  STOP LORATADINE   START XYZAL AND FLONASE   You will take the xyzal at night.  You will start the flonase today, you can take two sprays into each nare daily.  You can divide this up and take one at night and one in the morning, or you can take both in the morning. You can test this out and see what might work best for you.  Stay hydrated this helps with allergies and mucus clearing.  If you continue to have trouble or worsen call us back and we will see you in the office and make sure you dont have something else going on.   If you have questions just call the office back.   Arnett YOUR HANDS WELL AND FREQUENTLY. AVOID TOUCHING YOUR FACE, UNLESS YOUR HANDS ARE FRESHLY WASHED.  GET FRESH AIR DAILY. STAY HYDRATED WITH WATER.   It was a pleasure to see you and I look forward to continuing to work together on your health and well-being. Please do not hesitate to call the office if you need care or have questions about your care.  Have a wonderful day and week. With Gratitude, Cherly Beach, DNP, AGNP-BC

## 2019-02-22 ENCOUNTER — Encounter: Payer: Self-pay | Admitting: *Deleted

## 2019-03-04 ENCOUNTER — Ambulatory Visit: Payer: Self-pay | Admitting: Family Medicine

## 2019-03-05 ENCOUNTER — Ambulatory Visit: Payer: Self-pay | Admitting: Family Medicine

## 2019-03-23 ENCOUNTER — Ambulatory Visit: Payer: Self-pay | Admitting: Family Medicine

## 2019-03-23 ENCOUNTER — Encounter (INDEPENDENT_AMBULATORY_CARE_PROVIDER_SITE_OTHER): Payer: Self-pay

## 2019-03-23 ENCOUNTER — Other Ambulatory Visit: Payer: Self-pay

## 2019-04-01 ENCOUNTER — Other Ambulatory Visit (HOSPITAL_COMMUNITY): Payer: Self-pay | Admitting: Family Medicine

## 2019-04-01 DIAGNOSIS — Z1231 Encounter for screening mammogram for malignant neoplasm of breast: Secondary | ICD-10-CM

## 2019-04-07 ENCOUNTER — Other Ambulatory Visit: Payer: Self-pay

## 2019-04-07 ENCOUNTER — Ambulatory Visit (HOSPITAL_COMMUNITY)
Admission: RE | Admit: 2019-04-07 | Discharge: 2019-04-07 | Disposition: A | Discharge status: Home / Self Care | Payer: Self-pay | Source: Ambulatory Visit | Attending: Family Medicine | Admitting: Family Medicine

## 2019-04-07 DIAGNOSIS — Z1231 Encounter for screening mammogram for malignant neoplasm of breast: Secondary | ICD-10-CM | POA: Insufficient documentation

## 2019-04-18 ENCOUNTER — Other Ambulatory Visit: Payer: Self-pay | Admitting: *Deleted

## 2019-04-18 DIAGNOSIS — Z20822 Contact with and (suspected) exposure to covid-19: Secondary | ICD-10-CM

## 2019-04-25 LAB — NOVEL CORONAVIRUS, NAA: SARS-CoV-2, NAA: NOT DETECTED

## 2019-04-28 ENCOUNTER — Other Ambulatory Visit: Payer: Self-pay | Admitting: Family Medicine

## 2019-05-06 ENCOUNTER — Other Ambulatory Visit: Payer: Self-pay | Admitting: Family Medicine

## 2019-05-06 DIAGNOSIS — F4323 Adjustment disorder with mixed anxiety and depressed mood: Secondary | ICD-10-CM

## 2019-05-12 ENCOUNTER — Other Ambulatory Visit: Payer: Self-pay | Admitting: Family Medicine

## 2019-06-07 ENCOUNTER — Other Ambulatory Visit: Payer: Self-pay | Admitting: Family Medicine

## 2019-06-11 ENCOUNTER — Other Ambulatory Visit: Payer: Self-pay | Admitting: Family Medicine

## 2019-06-26 ENCOUNTER — Other Ambulatory Visit: Payer: Self-pay | Admitting: Family Medicine

## 2019-06-26 DIAGNOSIS — F4323 Adjustment disorder with mixed anxiety and depressed mood: Secondary | ICD-10-CM

## 2019-06-28 ENCOUNTER — Other Ambulatory Visit: Payer: Self-pay

## 2019-06-28 ENCOUNTER — Ambulatory Visit (INDEPENDENT_AMBULATORY_CARE_PROVIDER_SITE_OTHER): Payer: Self-pay

## 2019-06-28 ENCOUNTER — Telehealth: Payer: Self-pay | Admitting: Family Medicine

## 2019-06-28 DIAGNOSIS — F4323 Adjustment disorder with mixed anxiety and depressed mood: Secondary | ICD-10-CM

## 2019-06-28 DIAGNOSIS — E782 Mixed hyperlipidemia: Secondary | ICD-10-CM

## 2019-06-28 DIAGNOSIS — Z23 Encounter for immunization: Secondary | ICD-10-CM

## 2019-06-28 MED ORDER — PRAVASTATIN SODIUM 80 MG PO TABS: 80.0000 mg | ORAL_TABLET | Freq: Every day | ORAL | 2 refills | Status: DC

## 2019-06-28 MED ORDER — CITALOPRAM HYDROBROMIDE 40 MG PO TABS: 40.0000 mg | ORAL_TABLET | Freq: Every day | ORAL | 2 refills | Status: DC

## 2019-06-28 NOTE — Telephone Encounter (Signed)
Medications refilled

## 2019-06-28 NOTE — Telephone Encounter (Signed)
Please send to [facility]   Pravastatin & Citalopram

## 2019-07-08 ENCOUNTER — Other Ambulatory Visit: Payer: Self-pay | Admitting: Family Medicine

## 2019-07-12 ENCOUNTER — Other Ambulatory Visit: Payer: Self-pay

## 2019-07-12 ENCOUNTER — Telehealth: Payer: Self-pay | Admitting: Family Medicine

## 2019-07-12 MED ORDER — AMLODIPINE BESYLATE 5 MG PO TABS: 5.0000 mg | ORAL_TABLET | Freq: Every day | ORAL | 5 refills | Status: DC

## 2019-07-12 NOTE — Telephone Encounter (Signed)
Pt needs amLODipine (NORVASC) 5 MG tablet called in--  Pt is confused on what to take regarding sinus ---please call

## 2019-07-13 ENCOUNTER — Other Ambulatory Visit: Payer: Self-pay | Admitting: Family Medicine

## 2019-07-13 NOTE — Telephone Encounter (Signed)
Called pt, voicemail is full. Cannot leave message

## 2019-07-15 NOTE — Telephone Encounter (Signed)
Unable to reach pt, voicemail full so mailed her a letter with the changes Jarrett Soho suggested

## 2019-07-22 ENCOUNTER — Telehealth: Payer: Self-pay | Admitting: Family Medicine

## 2019-07-22 NOTE — Telephone Encounter (Signed)
Please call the pt regarding her Allergy pill [phone]

## 2019-07-22 NOTE — Telephone Encounter (Signed)
Called patient. She stated the pharmacy gave her Claritine and said she didn't have anything else. I advised her just to call and ask them to refill the Xyzal for her because she has refills on it. She verbalized understanding.

## 2019-08-08 ENCOUNTER — Other Ambulatory Visit: Payer: Self-pay | Admitting: Family Medicine

## 2019-08-18 ENCOUNTER — Encounter: Payer: Self-pay | Admitting: *Deleted

## 2019-09-01 ENCOUNTER — Other Ambulatory Visit: Payer: Self-pay

## 2019-09-01 DIAGNOSIS — J302 Other seasonal allergic rhinitis: Secondary | ICD-10-CM

## 2019-09-01 MED ORDER — LEVOCETIRIZINE DIHYDROCHLORIDE 5 MG PO TABS: 5.0000 mg | ORAL_TABLET | Freq: Every evening | ORAL | 1 refills | Status: DC

## 2019-09-01 MED ORDER — LORATADINE 10 MG PO TABS: ORAL_TABLET | ORAL | 1 refills | Status: DC

## 2019-09-09 ENCOUNTER — Other Ambulatory Visit: Payer: Self-pay | Admitting: Family Medicine

## 2019-09-24 ENCOUNTER — Ambulatory Visit: Payer: Self-pay | Admitting: Family Medicine

## 2019-09-27 ENCOUNTER — Ambulatory Visit: Payer: Self-pay

## 2019-09-28 ENCOUNTER — Encounter (INDEPENDENT_AMBULATORY_CARE_PROVIDER_SITE_OTHER): Payer: Self-pay | Admitting: *Deleted

## 2019-09-28 ENCOUNTER — Encounter: Payer: Self-pay | Admitting: Family Medicine

## 2019-09-28 ENCOUNTER — Other Ambulatory Visit: Payer: Self-pay

## 2019-09-28 ENCOUNTER — Ambulatory Visit (INDEPENDENT_AMBULATORY_CARE_PROVIDER_SITE_OTHER): Payer: Self-pay | Admitting: Family Medicine

## 2019-09-28 VITALS — BP 140/80 | HR 86 | Ht 62.0 in | Wt 169.0 lb

## 2019-09-28 DIAGNOSIS — Z1211 Encounter for screening for malignant neoplasm of colon: Secondary | ICD-10-CM

## 2019-09-28 DIAGNOSIS — Z0000 Encounter for general adult medical examination without abnormal findings: Secondary | ICD-10-CM

## 2019-09-28 NOTE — Patient Instructions (Signed)
[name] , Thank you for taking time to come for your Medicare Wellness Visit. I appreciate your ongoing commitment to your health goals. Please review the following plan we discussed and let me know if I can assist you in the future.   Please continue to practice social distancing to keep you, your family, and our community safe.  If you must go out, please wear a Mask and practice good handwashing.  Merry Christmas and Happy New Year :)  Screening recommendations/referrals: Colonoscopy: Ordered Referral to GI doctor today Mammogram: up to date Bone Density: up to date Recommended yearly ophthalmology/optometry visit for glaucoma screening and checkup Recommended yearly dental visit for hygiene and checkup  Vaccinations: Influenza vaccine: up to date Pneumococcal vaccine: up todate Tdap vaccine: due  Shingles vaccine:  Completed 2 part series when you can  Advanced directives: Family is aware of wishes   Conditions/risks identified: Falls are always a risk for [place]   Next appointment: 10/28/2019   Preventive Care 33 Years and Older, Female Preventive care refers to lifestyle choices and visits with your health care provider that can promote health and wellness. What does preventive care include?  A yearly physical exam. This is also called an annual well check.  Dental exams once or twice a year.  Routine eye exams. Ask your health care provider how often you should have your eyes checked.  Personal lifestyle choices, including:  Daily care of your teeth and gums.  Regular physical activity.  Eating a healthy diet.  Avoiding tobacco and drug use.  Limiting alcohol use.  Practicing safe sex.  Taking low-dose aspirin every day.  Taking vitamin and mineral supplements as recommended by your health care provider. What happens during an annual well check? The services and screenings done by your health care provider during your annual well check will depend on your  age, overall health, lifestyle risk factors, and family history of disease. Counseling  Your health care provider may ask you questions about your:  Alcohol use.  Tobacco use.  Drug use.  Emotional well-being.  Home and relationship well-being.  Sexual activity.  Eating habits.  History of falls.  Memory and ability to understand (cognition).  Work and work Statistician.  Reproductive health. Screening  You may have the following tests or measurements:  Height, weight, and BMI.  Blood pressure.  Lipid and cholesterol levels. These may be checked every 5 years, or more frequently if you are over 52 years old.  Skin check.  Lung cancer screening. You may have this screening every year starting at age 61 if you have a 30-pack-year history of smoking and currently smoke or have quit within the past 15 years.  Fecal occult blood test (FOBT) of the stool. You may have this test every year starting at age 7.  Flexible sigmoidoscopy or colonoscopy. You may have a sigmoidoscopy every 5 years or a colonoscopy every 10 years starting at age 37.  Hepatitis C blood test.  Hepatitis B blood test.  Sexually transmitted disease (STD) testing.  Diabetes screening. This is done by checking your blood sugar (glucose) after you have not eaten for a while (fasting). You may have this done every 1-3 years.  Bone density scan. This is done to screen for osteoporosis. You may have this done starting at age 43.  Mammogram. This may be done every 1-2 years. Talk to your health care provider about how often you should have regular mammograms. Talk with your health care provider about  your test results, treatment options, and if necessary, the need for more tests. Vaccines  Your health care provider may recommend certain vaccines, such as:  Influenza vaccine. This is recommended every year.  Tetanus, diphtheria, and acellular pertussis (Tdap, Td) vaccine. You may need a Td booster  every 10 years.  Zoster vaccine. You may need this after age 18.  Pneumococcal 13-valent conjugate (PCV13) vaccine. One dose is recommended after age 28.  Pneumococcal polysaccharide (PPSV23) vaccine. One dose is recommended after age 62. Talk to your health care provider about which screenings and vaccines you need and how often you need them. This information is not intended to replace advice given to you by your health care provider. Make sure you discuss any questions you have with your health care provider. Document Released: 11/10/2015 Document Revised: 07/03/2016 Document Reviewed: 08/15/2015 Elsevier Interactive Patient Education  2017 [place] Prevention in the Home Falls can cause injuries. They can happen to people of all ages. There are many things you can do to make your home safe and to help prevent falls. What can I do on the outside of my home?  Regularly fix the edges of walkways and driveways and fix any cracks.  Remove anything that might make you trip as you walk through a door, such as a raised step or threshold.  Trim any bushes or trees on the path to your home.  Use bright outdoor lighting.  Clear any walking paths of anything that might make someone trip, such as rocks or tools.  Regularly check to see if handrails are loose or broken. Make sure that both sides of any steps have handrails.  Any raised decks and porches should have guardrails on the edges.  Have any leaves, snow, or ice cleared regularly.  Use sand or salt on walking paths during winter.  Clean up any spills in your garage right away. This includes oil or grease spills. What can I do in the bathroom?  Use night lights.  Install grab bars by the toilet and in the tub and shower. Do not use towel bars as grab bars.  Use non-skid mats or decals in the tub or shower.  If you need to sit down in the shower, use a plastic, non-slip stool.  Keep the floor dry. Clean up any  water that spills on the floor as soon as it happens.  Remove soap buildup in the tub or shower regularly.  Attach bath mats securely with double-sided non-slip rug tape.  Do not have throw rugs and other things on the floor that can make you trip. What can I do in the bedroom?  Use night lights.  Make sure that you have a light by your bed that is easy to reach.  Do not use any sheets or blankets that are too big for your bed. They should not hang down onto the floor.  Have a firm chair that has side arms. You can use this for support while you get dressed.  Do not have throw rugs and other things on the floor that can make you trip. What can I do in the kitchen?  Clean up any spills right away.  Avoid walking on wet floors.  Keep items that you use a lot in easy-to-reach places.  If you need to reach something above you, use a strong step stool that has a grab bar.  Keep electrical cords out of the way.  Do not use floor polish or wax  that makes floors slippery. If you must use wax, use non-skid floor wax.  Do not have throw rugs and other things on the floor that can make you trip. What can I do with my stairs?  Do not leave any items on the stairs.  Make sure that there are handrails on both sides of the stairs and use them. Fix handrails that are broken or loose. Make sure that handrails are as long as the stairways.  Check any carpeting to make sure that it is firmly attached to the stairs. Fix any carpet that is loose or worn.  Avoid having throw rugs at the top or bottom of the stairs. If you do have throw rugs, attach them to the floor with carpet tape.  Make sure that you have a light switch at the top of the stairs and the bottom of the stairs. If you do not have them, ask someone to add them for you. What else can I do to help prevent falls?  Wear shoes that:  Do not have high heels.  Have rubber bottoms.  Are comfortable and fit you well.  Are closed  at the toe. Do not wear sandals.  If you use a stepladder:  Make sure that it is fully opened. Do not climb a closed stepladder.  Make sure that both sides of the stepladder are locked into place.  Ask someone to hold it for you, if possible.  Clearly mark and make sure that you can see:  Any grab bars or handrails.  First and last steps.  Where the edge of each step is.  Use tools that help you move around (mobility aids) if they are needed. These include:  Canes.  Walkers.  Scooters.  Crutches.  Turn on the lights when you go into a dark area. Replace any light bulbs as soon as they burn out.  Set up your furniture so you have a clear path. Avoid moving your furniture around.  If any of your floors are uneven, fix them.  If there are any pets around you, be aware of where they are.  Review your medicines with your doctor. Some medicines can make you feel dizzy. This can increase your chance of falling. Ask your doctor what other things that you can do to help prevent falls. This information is not intended to replace advice given to you by your health care provider. Make sure you discuss any questions you have with your health care provider. Document Released: 08/10/2009 Document Revised: 03/21/2016 Document Reviewed: 11/18/2014 Elsevier Interactive Patient Education  2017 [employer].

## 2019-09-28 NOTE — Progress Notes (Signed)
Subjective:   [name] is a 68 y.o. female who presents for Medicare Annual (Subsequent) preventive examination.  Location of Patient: Home Location of Provider: Telehealth Consent was obtain for visit to be over via telehealth. I verified that I am speaking with the correct person using two identifiers.   Review of Systems:   Cardiac Risk Factors include: advanced age (>67men, >24 women);dyslipidemia     Objective:     Vitals: There were no vitals taken for this visit.  There is no height or weight on file to calculate BMI.  Advanced Directives 09/28/2019 09/21/2018 04/04/2017 03/19/2017 09/11/2014  Does Patient Have a Medical Advance Directive? No Yes;No No No No  Does patient want to make changes to medical advance directive? - Yes (ED - Information included in AVS) - - -  Would patient like information on creating a medical advance directive? No - Patient declined No - Patient declined No - Patient declined - No - patient declined information    Tobacco Social History   Tobacco Use  Smoking Status Never Smoker  Smokeless Tobacco Never Used     Counseling given: Not Answered   Clinical Intake:  Pre-visit preparation completed: No  Pain : No/denies pain     Nutritional Risks: None Diabetes: No  How often do you need to have someone help you when you read instructions, pamphlets, or other written materials from your doctor or pharmacy?: 2 - Rarely What is the last grade level you completed in school?: 12  Interpreter Needed?: No     Past Medical History:  Diagnosis Date  . Allergy   . Anxiety   . GERD (gastroesophageal reflux disease)   . Hyperlipidemia   . Osteoarthritis    Past Surgical History:  Procedure Laterality Date  . cyst removed from right breast    . LAPAROSCOPIC SALPINGO OOPHERECTOMY Right 04/09/2017   Procedure: LAPAROSCOPIC RIGHT SALPINGO OOPHORECTOMY; RIGHT OVARIAN BENIGN CYSTIC TERATOMA;  Surgeon: Florian Buff, MD;  Location: [facility];  Service: Gynecology;  Laterality: Right;  . PARTIAL HYSTERECTOMY    . TUBAL LIGATION     Family History  Problem Relation Age of Onset  . Heart disease Mother   . Heart disease Father   . Diabetes Father   . Hypertension Sister   . Parkinson's disease Brother   . Heart disease Maternal Grandmother   . Cancer Paternal Grandmother        breast  . Diabetes Paternal Grandfather   . Hypertension Sister   . Hypertension Sister   . Down syndrome Son   . Eczema Son   . Post-traumatic stress disorder Daughter    Social History   Socioeconomic History  . Marital status: Single    Spouse name: Not on file  . Number of children: 3  . Years of education: 62  . Highest education level: 12th grade  Occupational History  . Not on file  Social Needs  . Financial resource strain: Somewhat hard  . Food insecurity    Worry: Sometimes true    Inability: Sometimes true  . Transportation needs    Medical: No    Non-medical: No  Tobacco Use  . Smoking status: Never Smoker  . Smokeless tobacco: Never Used  Substance and Sexual Activity  . Alcohol use: Yes    Comment: occasionally wine  . Drug use: No  . Sexual activity: Not Currently    Birth control/protection: Surgical    Comment: hyst  Lifestyle  .  Physical activity    Days per week: 7 days    Minutes per session: 20 min  . Stress: Only a little  Relationships  . Social connections    Talks on phone: More than three times a week    Gets together: Once a week    Attends religious service: More than 4 times per year    Active member of club or organization: Yes    Attends meetings of clubs or organizations: More than 4 times per year    Relationship status: Divorced  Other Topics Concern  . Not on file  Social History Narrative  . Not on file    Outpatient Encounter Medications as of 09/28/2019  Medication Sig  . amLODipine (NORVASC) 5 MG tablet Take 1 tablet (5 mg total) by mouth daily.  Marland Kitchen aspirin EC 81 MG tablet  Take 81 mg by mouth daily.  Marland Kitchen azelastine (ASTELIN) 0.1 % nasal spray Place 2 sprays into both nostrils 2 (two) times daily. Use in each nostril as directed  . citalopram (CELEXA) 40 MG tablet Take 1 tablet (40 mg total) by mouth daily.  . fluticasone (FLONASE) 50 MCG/ACT nasal spray Place 2 sprays into both nostrils daily. Can do in the morning, or do one spray in more and one at night.  . gabapentin (NEURONTIN) 300 MG capsule TAKE (1) CAPSULE BY MOUTH TWICE DAILY.  Marland Kitchen levocetirizine (XYZAL) 5 MG tablet Take 1 tablet (5 mg total) by mouth every evening.  . loratadine (CLARITIN) 10 MG tablet One tab daily  . Omega-3 Fatty Acids (FISH OIL CONCENTRATE) 300 MG CAPS Take 600 mg by mouth daily.  . pravastatin (PRAVACHOL) 80 MG tablet Take 1 tablet (80 mg total) by mouth daily.  . vitamin C (ASCORBIC ACID) 500 MG tablet Take 500 mg by mouth daily.  . vitamin E 1000 UNIT capsule Take 1,000 Units by mouth daily.   No facility-administered encounter medications on file as of 09/28/2019.     Activities of Daily Living In your present state of health, do you have any difficulty performing the following activities: 09/28/2019  Hearing? N  Vision? N  Difficulty concentrating or making decisions? N  Walking or climbing stairs? N  Dressing or bathing? N  Doing errands, shopping? N  Some recent data might be hidden    Patient Care Team: Fayrene Helper, MD as PCP - General    Assessment:   This is a routine wellness examination for [name].  Exercise Activities and Dietary recommendations Current Exercise Habits: Home exercise routine, Type of exercise: Other - see comments(biking), Time (Minutes): 15, Frequency (Times/Week): 7, Weekly Exercise (Minutes/Week): 105, Intensity: Moderate, Exercise limited by: None identified  Goals    . Increase physical activity       Fall Risk Fall Risk  09/28/2019 02/16/2019 09/21/2018 09/07/2018 03/06/2017  Falls in the past year? 0 1 1 1  No  Number falls in  past yr: 0 0 0 0 -  Injury with Fall? 0 1 1 1  -  Risk for fall due to : - - Medication side effect;Impaired balance/gait - -  Follow up Falls evaluation completed;Education provided;Follow up appointment;Falls prevention discussed - - - -   Is the patient's home free of loose throw rugs in walkways, pet beds, electrical cords, etc?   yes      Grab bars in the bathroom? yes      Handrails on the stairs?   yes      Adequate lighting?  yes  Timed Get Up and Go performed: n/a  Depression Screen PHQ 2/9 Scores 09/28/2019 02/16/2019 09/21/2018 09/07/2018  PHQ - 2 Score 0 0 0 0  PHQ- 9 Score - - - -     Cognitive Function     6CIT Screen 09/21/2018  What Year? 0 points  What month? 0 points  What time? 0 points  Count back from 20 0 points  Months in reverse 0 points  Repeat phrase 2 points  Total Score 2    Immunization History  Administered Date(s) Administered  . Fluad Quad(high Dose 65+) 06/28/2019  . Influenza Split 07/13/2012, 08/13/2016  . Influenza, High Dose Seasonal PF 09/07/2018  . Pneumococcal Conjugate-13 03/06/2017  . Pneumococcal Polysaccharide-23 09/08/2018  . Td 05/17/2008  . Zoster 07/13/2012    Qualifies for Shingles Vaccine? Completed, will complete in future  Screening Tests Health Maintenance  Topic Date Due  . TETANUS/TDAP  05/17/2018  . COLONOSCOPY  08/02/2018  . MAMMOGRAM  04/06/2021  . INFLUENZA VACCINE  Completed  . DEXA SCAN  Completed  . Hepatitis C Screening  Completed  . PNA vac Low Risk Adult  Completed    Cancer Screenings: Lung: Low Dose CT Chest recommended if Age 72-80 years, 30 pack-year currently smoking OR have quit w/in 15years. Patient does not qualify. Breast:  Up to date on Mammogram? Yes   Up to date of Bone Density/Dexa? Yes Colorectal:  Ordered referral today  Additional Screenings: : Hepatitis C Screening: completed     Plan:     1. Encounter for Medicare annual wellness exam  I have personally reviewed and  noted the following in the patient's chart:   . Medical and social history . Use of alcohol, tobacco or illicit drugs  . Current medications and supplements . Functional ability and status . Nutritional status . Physical activity . Advanced directives . List of other physicians . Hospitalizations, surgeries, and ER visits in previous 12 months . Vitals . Screenings to include cognitive, depression, and falls . Referrals and appointments  In addition, I have reviewed and discussed with patient certain preventive protocols, quality metrics, and best practice recommendations. A written personalized care plan for preventive services as well as general preventive health recommendations were provided to patient.   I provided 20 minutes of non-face-to-face time during this encounter.  Perlie Mayo, NP  09/28/2019

## 2019-10-11 ENCOUNTER — Telehealth: Payer: Self-pay | Admitting: *Deleted

## 2019-10-11 ENCOUNTER — Other Ambulatory Visit: Payer: Self-pay

## 2019-10-11 MED ORDER — AMLODIPINE BESYLATE 5 MG PO TABS: 5.0000 mg | ORAL_TABLET | Freq: Every day | ORAL | 5 refills | Status: DC

## 2019-10-11 MED ORDER — GABAPENTIN 300 MG PO CAPS: 300.0000 mg | ORAL_CAPSULE | Freq: Two times a day (BID) | ORAL | 4 refills | Status: DC

## 2019-10-11 NOTE — Telephone Encounter (Signed)
Pt needs her gabapentin and bp medication sent to [facility] she is going to run out of the medication

## 2019-10-11 NOTE — Telephone Encounter (Signed)
Medications have been sent to pharmacy 

## 2019-10-15 ENCOUNTER — Other Ambulatory Visit: Payer: Self-pay

## 2019-10-18 ENCOUNTER — Other Ambulatory Visit: Payer: Self-pay

## 2019-10-25 ENCOUNTER — Ambulatory Visit: Payer: Self-pay | Admitting: Family Medicine

## 2019-10-28 ENCOUNTER — Encounter: Payer: Self-pay | Admitting: Family Medicine

## 2019-10-28 ENCOUNTER — Ambulatory Visit (INDEPENDENT_AMBULATORY_CARE_PROVIDER_SITE_OTHER): Payer: Self-pay | Admitting: Family Medicine

## 2019-10-28 ENCOUNTER — Other Ambulatory Visit: Payer: Self-pay

## 2019-10-28 VITALS — BP 122/72 | HR 73 | Temp 96.0°F | Resp 15 | Ht 62.0 in | Wt 168.0 lb

## 2019-10-28 DIAGNOSIS — J309 Allergic rhinitis, unspecified: Secondary | ICD-10-CM

## 2019-10-28 DIAGNOSIS — F411 Generalized anxiety disorder: Secondary | ICD-10-CM

## 2019-10-28 DIAGNOSIS — I10 Essential (primary) hypertension: Secondary | ICD-10-CM

## 2019-10-28 DIAGNOSIS — E785 Hyperlipidemia, unspecified: Secondary | ICD-10-CM

## 2019-10-28 DIAGNOSIS — F321 Major depressive disorder, single episode, moderate: Secondary | ICD-10-CM

## 2019-10-28 DIAGNOSIS — E669 Obesity, unspecified: Secondary | ICD-10-CM

## 2019-10-28 DIAGNOSIS — R7301 Impaired fasting glucose: Secondary | ICD-10-CM

## 2019-10-28 NOTE — Patient Instructions (Addendum)
F/U with MD in office for re evaluation of depression in 8 to 10 weeks   Please get fasting labs in next 2 weeks ( need to re order)  Please assist with follow through of colonoscopy, needs this  Please commit to daily citalopram, this will help the depression  Continue to enjoy bike riding and stay away from sugar and any and all unhealthy habits  Think about what you will eat, plan ahead. Choose " clean, green, fresh or frozen" over canned, processed or packaged foods which are more sugary, salty and fatty. 70 to 75% of food eaten should be vegetables and fruit. Three meals at set times with snacks allowed between meals, but they must be fruit or vegetables. Aim to eat over a 12 hour period , example 7 am to 7 pm, and STOP after  your last meal of the day. Drink water,generally about 64 ounces per day, no other drink is as healthy. Fruit juice is best enjoyed in a healthy way, by EATING the fruit.  Thanks for choosing [facility], we consider it a privelige to serve you.

## 2019-10-29 ENCOUNTER — Encounter: Payer: Self-pay | Admitting: Family Medicine

## 2019-10-29 DIAGNOSIS — F324 Major depressive disorder, single episode, in partial remission: Secondary | ICD-10-CM | POA: Insufficient documentation

## 2019-10-29 DIAGNOSIS — E669 Obesity, unspecified: Secondary | ICD-10-CM | POA: Insufficient documentation

## 2019-10-29 DIAGNOSIS — F321 Major depressive disorder, single episode, moderate: Secondary | ICD-10-CM | POA: Insufficient documentation

## 2019-10-29 NOTE — Assessment & Plan Note (Signed)
Controlled, no change in medication  

## 2019-10-29 NOTE — Assessment & Plan Note (Signed)
Controlled, no change in medication DASH diet and commitment to daily physical activity for a minimum of 30 minutes discussed and encouraged, as a part of hypertension management. The importance of attaining a healthy weight is also discussed.  BP/Weight 10/28/2019 09/28/2019 09/21/2018 09/07/2018 01/01/2018 04/17/2017 [id number]  Systolic BP 123XX123 [id number]  Diastolic BP 72 80 80 80 82 80 58  Wt. (Lbs) 168 169 169 171.04 167 159.4 162  BMI 30.73 30.91 30.91 31.28 29.58 28.24 28.7

## 2019-10-29 NOTE — Assessment & Plan Note (Addendum)
Importance of medication compliance is stressed,not currently controlled due to non compliance, not suicidal or homicidal

## 2019-10-29 NOTE — Progress Notes (Signed)
   [name]     MRN: [id number]      DOB: 1951-04-25   HPI [name] is here for follow up and re-evaluation of chronic medical conditions, medication management and review of any available recent lab and radiology data.  Preventive health is updated, specifically  Cancer screening and Immunization.   States she unreliably take her depression meds and 3 to 4 days per week has severe bouts of depression, is not suicidal or homicidal Has been riding bike for exercise and also watching sweets  ROS Denies recent fever or chills. Denies sinus pressure, nasal congestion, ear pain or sore throat. Denies chest congestion, productive cough or wheezing. Denies chest pains, palpitations and leg swelling Denies abdominal pain, nausea, vomiting,diarrhea or constipation.   Denies dysuria, frequency, hesitancy or incontinence. Denies joint pain, swelling and limitation in mobility. Denies headaches, seizures, numbness, or tingling.  Denies skin break down or rash.   PE  BP 122/72   Pulse 73   Temp (!) 96 F (35.6 C) (Temporal)   Resp 15   Ht 5\' 2"  (1.575 m)   Wt 168 lb (76.2 kg)   SpO2 95%   BMI 30.73 kg/m   Patient alert and oriented and in no cardiopulmonary distress.  HEENT: No facial asymmetry, EOMI,     Neck supple .  Chest: Clear to auscultation bilaterally.  CVS: S1, S2 no murmurs, no S3.Regular rate.  ABD: Soft non tender.   Ext: No edema  MS: Adequate ROM spine, shoulders, hips and knees.  Skin: Intact, no ulcerations or rash noted.  Psych: Good eye contact, normal affect. Memory intact not anxious or depressed appearing.  CNS: CN 2-12 intact, power,  normal throughout.no focal deficits noted.   Assessment & Plan  Essential hypertension Controlled, no change in medication DASH diet and commitment to daily physical activity for a minimum of 30 minutes discussed and encouraged, as a part of hypertension management. The importance of attaining a healthy weight is  also discussed.  BP/Weight 10/28/2019 09/28/2019 09/21/2018 09/07/2018 01/01/2018 04/17/2017 [id number]  Systolic BP 123XX123 [id number] 0000000  Diastolic BP 72 80 80 80 82 80 58  Wt. (Lbs) 168 169 169 171.04 167 159.4 162  BMI 30.73 30.91 30.91 31.28 29.58 28.24 28.7       Hyperlipemia Hyperlipidemia:Low fat diet discussed and encouraged.   Lipid Panel  Lab Results  Component Value Date   CHOL 253 (H) 01/26/2016   HDL 70 01/26/2016   LDLCALC 164 (H) 01/26/2016   TRIG 97 01/26/2016   CHOLHDL 3.6 01/26/2016   Uncontrolled and not at goal Updated lab needed at/ before next visit.     Depression, major, single episode, moderate (HCC) Importance of medication compliance is stressed,not currently controlled due to non compliance, not suicidal or homicidal  Allergic rhinitis Controlled, no change in medication   GAD (generalized anxiety disorder) Uncontrolled due to medication non compliance

## 2019-10-29 NOTE — Assessment & Plan Note (Signed)
Uncontrolled due to medication non compliance

## 2019-10-29 NOTE — Assessment & Plan Note (Signed)
Hyperlipidemia:Low fat diet discussed and encouraged.   Lipid Panel  Lab Results  Component Value Date   CHOL 253 (H) 01/26/2016   HDL 70 01/26/2016   LDLCALC 164 (H) 01/26/2016   TRIG 97 01/26/2016   CHOLHDL 3.6 01/26/2016   Uncontrolled and not at goal Updated lab needed at/ before next visit.

## 2019-11-09 ENCOUNTER — Other Ambulatory Visit: Payer: Self-pay

## 2019-11-09 ENCOUNTER — Ambulatory Visit: Payer: HRSA Program | Attending: Internal Medicine

## 2019-11-09 DIAGNOSIS — U071 COVID-19: Secondary | ICD-10-CM | POA: Diagnosis not present

## 2019-11-09 DIAGNOSIS — Z20822 Contact with and (suspected) exposure to covid-19: Secondary | ICD-10-CM

## 2019-11-10 ENCOUNTER — Other Ambulatory Visit: Payer: Self-pay | Admitting: Family Medicine

## 2019-11-10 DIAGNOSIS — E782 Mixed hyperlipidemia: Secondary | ICD-10-CM

## 2019-11-10 LAB — NOVEL CORONAVIRUS, NAA: SARS-CoV-2, NAA: DETECTED — AB

## 2019-11-12 ENCOUNTER — Telehealth: Payer: Self-pay | Admitting: Nurse Practitioner

## 2019-11-12 NOTE — Telephone Encounter (Signed)
Called to Discuss with patient about Covid symptoms and the use of bamlanivimab, a monoclonal antibody infusion for those with mild to moderate Covid symptoms and at a high risk of hospitalization.     Pt is qualified for this infusion at the [facility] infusion center due to co-morbid conditions and/or a member of an at-risk group.     Patient Active Problem List   Diagnosis Date Noted  . Depression, major, single episode, moderate ([place]) 10/29/2019  . Obesity (BMI 30.0-34.9) 10/29/2019  . Essential hypertension 01/01/2018  . Prediabetes 03/30/2015  . Back pain with radiation 07/13/2012  . Hyperlipemia 06/22/2008  . Allergic rhinitis 01/08/2008  . OVERACTIVE BLADDER 04/10/2007  . GAD (generalized anxiety disorder) 08/22/2006  . OSTEOARTHRITIS 08/22/2006    Patient denies any symptoms at this time. Symptoms tier reviewed as well as criteria for ending isolation. Preventative practices reviewed. Patient verbalized understanding.    Callback number to the infusion center given. Patient advised to go to Urgent care or ED with severe symptoms.   Symptoms first started on 11/07/19

## 2019-12-07 ENCOUNTER — Other Ambulatory Visit: Payer: Self-pay | Admitting: Family Medicine

## 2019-12-07 DIAGNOSIS — F4323 Adjustment disorder with mixed anxiety and depressed mood: Secondary | ICD-10-CM

## 2019-12-21 ENCOUNTER — Other Ambulatory Visit: Payer: Self-pay | Admitting: Family Medicine

## 2019-12-21 DIAGNOSIS — E782 Mixed hyperlipidemia: Secondary | ICD-10-CM

## 2019-12-29 ENCOUNTER — Ambulatory Visit: Payer: Self-pay | Admitting: Family Medicine

## 2019-12-30 ENCOUNTER — Encounter: Payer: Self-pay | Admitting: Family Medicine

## 2019-12-30 ENCOUNTER — Ambulatory Visit (INDEPENDENT_AMBULATORY_CARE_PROVIDER_SITE_OTHER): Payer: Self-pay | Admitting: Family Medicine

## 2019-12-30 ENCOUNTER — Other Ambulatory Visit: Payer: Self-pay

## 2019-12-30 VITALS — BP 122/72 | Ht 62.0 in | Wt 168.0 lb

## 2019-12-30 DIAGNOSIS — I10 Essential (primary) hypertension: Secondary | ICD-10-CM

## 2019-12-30 DIAGNOSIS — R7303 Prediabetes: Secondary | ICD-10-CM

## 2019-12-30 DIAGNOSIS — F321 Major depressive disorder, single episode, moderate: Secondary | ICD-10-CM

## 2019-12-30 DIAGNOSIS — E785 Hyperlipidemia, unspecified: Secondary | ICD-10-CM

## 2019-12-30 DIAGNOSIS — Z1231 Encounter for screening mammogram for malignant neoplasm of breast: Secondary | ICD-10-CM

## 2019-12-30 DIAGNOSIS — J309 Allergic rhinitis, unspecified: Secondary | ICD-10-CM

## 2019-12-30 NOTE — Progress Notes (Signed)
Virtual Visit via Telephone Note  I connected with [name] on 12/30/19 at  2:20 PM EST by telephone and verified that I am speaking with the correct person using two identifiers.  Location: Patient: home Provider: office   I discussed the limitations, risks, security and privacy concerns of performing an evaluation and management service by telephone and the availability of in person appointments. I also discussed with the patient that there may be a patient responsible charge related to this service. The patient expressed understanding and agreed to proceed.   History of Present Illness: F/U chronic problems, medication review, and refill medication when necessary. Review most recent labs and order labs which are due Review preventive health and update with necessary referrals or immunizations as indicated Diagnosed with covid still unable to taste , thinks this is improving. No fever , chills or body aches, generally feels well Depression well controlled on medication Keeps active with exercise commitment at least 3 times per week Denies recent fever or chills. Denies sinus pressure, nasal congestion, ear pain or sore throat. Denies chest congestion, productive cough or wheezing. Denies chest pains, palpitations and leg swelling Denies abdominal pain, nausea, vomiting,diarrhea or constipation.   Denies dysuria, frequency, hesitancy or incontinence. Denies joint pain, swelling and limitation in mobility. Denies headaches, seizures, numbness, or tingling. Denies uncontrolled  depression, anxiety or insomnia. Denies skin break down or rash.       Observations/Objective: BP 122/72   Ht 5\' 2"  (1.575 m)   Wt 168 lb (76.2 kg)   BMI 30.73 kg/m  Good communication with no confusion and intact memory. Alert and oriented x 3 No signs of respiratory distress during speech    Assessment and Plan:  Essential hypertension Controlled, no change in medication DASH diet and  commitment to daily physical activity for a minimum of 30 minutes discussed and encouraged, as a part of hypertension management. The importance of attaining a healthy weight is also discussed.  BP/Weight 12/30/2019 10/28/2019 09/28/2019 09/21/2018 09/07/2018 01/01/2018 0000000  Systolic BP 123XX123 123XX123 [id number] [id number] 0000000 123456  Diastolic BP 72 72 80 80 80 82 80  Wt. (Lbs) 168 168 169 169 171.04 167 159.4  BMI 30.73 30.73 30.91 30.91 31.28 29.58 28.24       Depression, major, single episode, moderate (HCC) Controlled on medication, continue same  Allergic rhinitis Controlled, no change in medication   Hyperlipemia Hyperlipidemia:Low fat diet discussed and encouraged.   Lipid Panel  Lab Results  Component Value Date   CHOL 253 (H) 01/26/2016   HDL 70 01/26/2016   LDLCALC 164 (H) 01/26/2016   TRIG 97 01/26/2016   CHOLHDL 3.6 01/26/2016   Updated lab needed , past due, uncontrolled      Follow Up Instructions:    I discussed the assessment and treatment plan with the patient. The patient was provided an opportunity to ask questions and all were answered. The patient agreed with the plan and demonstrated an understanding of the instructions.   The patient was advised to call back or seek an in-person evaluation if the symptoms worsen or if the condition fails to improve as anticipated.  I provided 20 minutes of non-face-to-face time during this encounter.   Tula Nakayama, MD

## 2019-12-30 NOTE — Patient Instructions (Signed)
Wellness when due , please schedule  Please schedule June mammogram and mail  Last labs drawn are 3 years ago, very important that you get labs ordered this month , please  Thankful that you are doing well overall, please continue all current medications  F/u in October, call if you need me sooner  Please call and schedule appointment with dr Laural Golden for your colonoscopy

## 2019-12-31 ENCOUNTER — Encounter: Payer: Self-pay | Admitting: Family Medicine

## 2019-12-31 NOTE — Assessment & Plan Note (Addendum)
Hyperlipidemia:Low fat diet discussed and encouraged.   Lipid Panel  Lab Results  Component Value Date   CHOL 253 (H) 01/26/2016   HDL 70 01/26/2016   LDLCALC 164 (H) 01/26/2016   TRIG 97 01/26/2016   CHOLHDL 3.6 01/26/2016   Updated lab needed , past due, uncontrolled

## 2019-12-31 NOTE — Assessment & Plan Note (Signed)
Controlled, no change in medication  

## 2019-12-31 NOTE — Assessment & Plan Note (Signed)
Controlled, no change in medication DASH diet and commitment to daily physical activity for a minimum of 30 minutes discussed and encouraged, as a part of hypertension management. The importance of attaining a healthy weight is also discussed.  BP/Weight 12/30/2019 10/28/2019 09/28/2019 09/21/2018 09/07/2018 01/01/2018 0000000  Systolic BP 123XX123 123XX123 [id number] [id number] [id number] 0000000 123456  Diastolic BP 72 72 80 80 80 82 80  Wt. (Lbs) 168 168 169 169 171.04 167 159.4  BMI 30.73 30.73 30.91 30.91 31.28 29.58 28.24

## 2019-12-31 NOTE — Assessment & Plan Note (Signed)
Patient educated about the importance of limiting  Carbohydrate intake , the need to commit to daily physical activity for a minimum of 30 minutes , and to commit weight loss. The fact that changes in all these areas will reduce or eliminate all together the development of diabetes is stressed.  Updated lab needed at/ before next visit.   Diabetic Labs Latest Ref Rng & Units 04/04/2017 03/19/2017 01/26/2016 04/12/2014 09/11/2013  HbA1c <5.7 % - - 5.9(H) 6.2(H) -  Chol 125 - 200 mg/dL - - 253(H) 233(H) 258(H)  HDL >=46 mg/dL - - 70 65 58  Calc LDL <130 mg/dL - - 164(H) 138(H) 170(H)  Triglycerides <150 mg/dL - - 97 150(H) 149  Creatinine 0.44 - 1.00 mg/dL 0.90 0.76 0.89 0.88 0.89   BP/Weight 12/30/2019 10/28/2019 09/28/2019 09/21/2018 09/07/2018 01/01/2018 0000000  Systolic BP 123XX123 123XX123 [id number] XX123456 XX123456 0000000 123456  Diastolic BP 72 72 80 80 80 82 80  Wt. (Lbs) 168 168 169 169 171.04 167 159.4  BMI 30.73 30.73 30.91 30.91 31.28 29.58 28.24   No flowsheet data found.

## 2019-12-31 NOTE — Assessment & Plan Note (Signed)
Controlled on medication , continue same ?

## 2020-01-13 ENCOUNTER — Other Ambulatory Visit: Payer: Self-pay | Admitting: Family Medicine

## 2020-01-13 DIAGNOSIS — F4323 Adjustment disorder with mixed anxiety and depressed mood: Secondary | ICD-10-CM

## 2020-02-05 ENCOUNTER — Ambulatory Visit: Payer: Self-pay | Attending: Internal Medicine

## 2020-02-05 DIAGNOSIS — Z23 Encounter for immunization: Secondary | ICD-10-CM

## 2020-02-05 NOTE — Progress Notes (Signed)
   Covid-19 Vaccination Clinic  Name:  [name]    MRN: [id number] DOB: 1951/05/06  02/05/2020  [name] was observed post Covid-19 immunization for 15 minutes without incident. She was provided with Vaccine Information Sheet and instruction to access the V-Safe system.   [name] was instructed to call 911 with any severe reactions post vaccine: Marland Kitchen Difficulty breathing  . Swelling of face and throat  . A fast heartbeat  . A bad rash all over body  . Dizziness and weakness   Immunizations Administered    Name Date Dose VIS Date Route   Moderna COVID-19 Vaccine 02/05/2020 12:07 PM 0.5 mL 09/28/2019 Intramuscular   Manufacturer: Moderna   Lot: [id number]   [id number]

## 2020-02-08 ENCOUNTER — Other Ambulatory Visit: Payer: Self-pay | Admitting: Family Medicine

## 2020-02-16 ENCOUNTER — Other Ambulatory Visit: Payer: Self-pay | Admitting: Family Medicine

## 2020-02-18 ENCOUNTER — Other Ambulatory Visit: Payer: Self-pay

## 2020-02-18 ENCOUNTER — Telehealth (INDEPENDENT_AMBULATORY_CARE_PROVIDER_SITE_OTHER): Payer: Self-pay | Admitting: Family Medicine

## 2020-02-18 ENCOUNTER — Encounter: Payer: Self-pay | Admitting: Family Medicine

## 2020-02-18 VITALS — BP 122/72 | Ht 62.0 in | Wt 168.0 lb

## 2020-02-18 DIAGNOSIS — J302 Other seasonal allergic rhinitis: Secondary | ICD-10-CM

## 2020-02-18 MED ORDER — FLUTICASONE PROPIONATE 50 MCG/ACT NA SUSP: 2.0000 | Freq: Every day | NASAL | 2 refills | Status: DC

## 2020-02-18 NOTE — Progress Notes (Signed)
Virtual Visit via Telephone Note   This visit type was conducted due to national recommendations for restrictions regarding the COVID-19 Pandemic (e.g. social distancing) in an effort to limit this patient's exposure and mitigate transmission in our community.  Due to her co-morbid illnesses, this patient is at least at moderate risk for complications without adequate follow up.  This format is felt to be most appropriate for this patient at this time.  The patient did not have access to video technology/had technical difficulties with video requiring transitioning to audio format only (telephone).  All issues noted in this document were discussed and addressed.  No physical exam could be performed with this format.    Evaluation Performed:  Follow-up visit  Date:  02/18/2020   ID:  [name], DOB 1950-12-15, MRN [id number]  Patient Location: Home Provider Location: Office  Location of Patient: Home Location of Provider: Telehealth Consent was obtain for visit to be over via telehealth. I verified that I am speaking with the correct person using two identifiers.  PCP:  Fayrene Helper, MD   Chief Complaint: Allergies  History of Present Illness:    [name] is a 69 y.o. female with history of allergies, GERD, osteoarthritis among others.  Presents today with itchy watery eyes, throat, ears, nose that is been nonstop for 2 weeks.  She has been trying her medications without much relief. She reports that she has not been taking the Claritin daily however only as needed when her symptoms got bad.  She has been using her Flonase regularly.  And she had tried Xyzal before but that was not very effective for her as much as Claritin.  She reports that she has a big Tria right outside her house that might be causing most of the problem.  And she has been trying to do yard work recently.  She denies having any headache, facial pressure, chewing discomfort, swallowing pain, cough,  shortness of breath, fever or chills.  The patient does not have symptoms concerning for COVID-19 infection (fever, chills, cough, or new shortness of breath).   Past Medical, Surgical, Social History, Allergies, and Medications have been Reviewed.  Past Medical History:  Diagnosis Date  . Allergy   . Anxiety   . GERD (gastroesophageal reflux disease)   . Hyperlipidemia   . Osteoarthritis    Past Surgical History:  Procedure Laterality Date  . cyst removed from right breast    . LAPAROSCOPIC SALPINGO OOPHERECTOMY Right 04/09/2017   Procedure: LAPAROSCOPIC RIGHT SALPINGO OOPHORECTOMY; RIGHT OVARIAN BENIGN CYSTIC TERATOMA;  Surgeon: Florian Buff, MD;  Location: [facility];  Service: Gynecology;  Laterality: Right;  . PARTIAL HYSTERECTOMY    . TUBAL LIGATION       Current Meds  Medication Sig  . amLODipine (NORVASC) 5 MG tablet Take 1 tablet (5 mg total) by mouth daily.  Marland Kitchen aspirin EC 81 MG tablet Take 81 mg by mouth daily.  Marland Kitchen azelastine (ASTELIN) 0.1 % nasal spray Place 2 sprays into both nostrils 2 (two) times daily. Use in each nostril as directed  . citalopram (CELEXA) 40 MG tablet TAKE ONE TABLET BY MOUTH ONCE DAILY.  . fluticasone (FLONASE) 50 MCG/ACT nasal spray Place 2 sprays into both nostrils daily. Can do in the morning, or do one spray in more and one at night.  . gabapentin (NEURONTIN) 300 MG capsule Take 1 capsule (300 mg total) by mouth 2 (two) times daily.  Marland Kitchen loratadine (CLARITIN) 10  MG tablet TAKE (1) TABLET BY MOUTH ONCE DAILY AS NEEDED FOR ALLERGIES.  Marland Kitchen Omega-3 Fatty Acids (FISH OIL CONCENTRATE) 300 MG CAPS Take 600 mg by mouth daily.  Marland Kitchen oxybutynin (DITROPAN) 5 MG tablet TAKE (1) TABLET BY MOUTH TWICE DAILY.  . pravastatin (PRAVACHOL) 80 MG tablet TAKE ONE TABLET BY MOUTH ONCE DAILY.  . vitamin C (ASCORBIC ACID) 500 MG tablet Take 500 mg by mouth daily.  . vitamin E 1000 UNIT capsule Take 1,000 Units by mouth daily.  . [DISCONTINUED] fluticasone (FLONASE) 50 MCG/ACT  nasal spray Place 2 sprays into both nostrils daily. Can do in the morning, or do one spray in more and one at night.     Allergies:   Ibuprofen   ROS:   Please see the history of present illness.    All other systems reviewed and are negative.   Labs/Other Tests and Data Reviewed:    Recent Labs: No results found for requested labs within last 8760 hours.   Recent Lipid Panel Lab Results  Component Value Date/Time   CHOL 253 (H) 01/26/2016 08:05 AM   TRIG 97 01/26/2016 08:05 AM   HDL 70 01/26/2016 08:05 AM   CHOLHDL 3.6 01/26/2016 08:05 AM   LDLCALC 164 (H) 01/26/2016 08:05 AM    Wt Readings from Last 3 Encounters:  02/18/20 168 lb (76.2 kg)  12/30/19 168 lb (76.2 kg)  10/28/19 168 lb (76.2 kg)     Objective:    Vital Signs:  BP 122/72   Ht 5\' 2"  (1.575 m)   Wt 168 lb (76.2 kg)   BMI 30.73 kg/m    VITAL SIGNS:  reviewed GEN:  Alert and oriented, noted congestion and voice RESPIRATORY:  No shortness of breath noted in conversation PSYCH:  Normal affect and mood  ASSESSMENT & PLAN:    1. Seasonal allergies  - fluticasone (FLONASE) 50 MCG/ACT nasal spray; Place 2 sprays into both nostrils daily. Can do in the morning, or do one spray in more and one at night.  Dispense: 16 g; Refill: 2   Time:   Today, I have spent 10 minutes with the patient with telehealth technology discussing the above problems.     Medication Adjustments/Labs and Tests Ordered: Current medicines are reviewed at length with the patient today.  Concerns regarding medicines are outlined above.   Tests Ordered: No orders of the defined types were placed in this encounter.   Medication Changes: Meds ordered this encounter  Medications  . fluticasone (FLONASE) 50 MCG/ACT nasal spray    Sig: Place 2 sprays into both nostrils daily. Can do in the morning, or do one spray in more and one at night.    Dispense:  16 g    Refill:  2    Order Specific Question:   Supervising Provider     Answer:   Fayrene Helper [id number]    Disposition:  Follow up 08/01/2020  Signed, Perlie Mayo, NP  02/18/2020 9:28 AM     [facility]

## 2020-02-18 NOTE — Patient Instructions (Signed)
I appreciate the opportunity to provide you with care for your health and wellness. Today we discussed: allergies  Follow up: as scheduled  No labs or referrals today  Use Claritin daily Flonase daily Benadryl as needed for uncontrolled symptoms Please pick up allergy eye drops  Please continue to practice social distancing to keep you, your family, and our community safe.  If you must go out, please wear a mask and practice good handwashing.  It was a pleasure to see you and I look forward to continuing to work together on your health and well-being. Please do not hesitate to call the office if you need care or have questions about your care.  Have a wonderful day and week. With Gratitude, Cherly Beach, DNP, AGNP-BC  Allergies can cause a lot of symptoms: watery, itching eyes, runny nose (clear), sneezing, sinus pressure, and headaches. This is not making you contagious to others. You can not spread to others or catch this from others. These symptoms happen after you have been exposed to something that you are allergic to an allergen. Prevention: The best prevention is to avoid the things that you know you are allergic to, for example smoke (cigarette, cigar, wood); pollens and molds; animal dander; dust mites. And indoor inhalants such as cleaning products or aerosol sprays.  Target your bedroom as allergy free by removing carpets, damp mopping floors weekly, hanging washable curtains instead of blinds, removing books and stuffed animals, using foam pillows, and encasing pillows and mattress in plastic. Do not blow your nose too frequently or too hard. It may cause your eardrum to perforate (tear). Blow through both nostrils at the same time to equalize pressure.  Use tissue when you blow your nose. Dispose of them and then wash your hands. If no tissue is available, do the "elbow sneeze" into the bend of your arm (away from your open hands). Always wash your hands.  If able use the [facility]  in the house and car to reduce exposure to pollens. Use an air filtration system in your house or buy a small one for your bedroom. Dust your house often, using a cloth and cleaner or polish that keeps the dust from flying into the air. Allergy testing can be done if you have had allergies for a long time and are not doing well on current treatments. Take your medications as directed. If you find the medications are not working let your healthcare provider know. It might take more than one medication to control allergies, especially, seasonal ones.

## 2020-02-18 NOTE — Assessment & Plan Note (Signed)
Not well controlled currently.  Encouraged to take Claritin daily instead of as needed.  Use Flonase daily avoid going outside without a mask or glasses on.  Immediately removing clothing and showering when needed after going outside.  Educated on signs and symptoms of possible sinus infection starting from uncontrolled allergies.  Advised to call the office back if not feeling better after the weekend.  Encouraged to get eyedrops to help with allergy symptoms and to use Benadryl for excessive symptoms that are not controlled right now with Claritin.

## 2020-03-04 ENCOUNTER — Ambulatory Visit: Payer: Self-pay

## 2020-03-07 ENCOUNTER — Ambulatory Visit: Payer: Self-pay | Attending: Internal Medicine

## 2020-03-07 DIAGNOSIS — Z23 Encounter for immunization: Secondary | ICD-10-CM

## 2020-03-07 NOTE — Progress Notes (Signed)
   Covid-19 Vaccination Clinic  Name:  [name]    MRN: [id number] DOB: 1951-02-20  03/07/2020  [name] was observed post Covid-19 immunization for 15 minutes without incident. She was provided with Vaccine Information Sheet and instruction to access the V-Safe system.   [name] was instructed to call 911 with any severe reactions post vaccine: Marland Kitchen Difficulty breathing  . Swelling of face and throat  . A fast heartbeat  . A bad rash all over body  . Dizziness and weakness   Immunizations Administered    Name Date Dose VIS Date Route   Moderna COVID-19 Vaccine 03/07/2020  9:40 AM 0.5 mL 09/2019 Intramuscular   Manufacturer: Levan Hurst   Lot: [id number]   [id number]      Covid-19 Vaccination Clinic  Name:  [name]    MRN: [id number] DOB: 1951-06-29  03/07/2020  [name] was observed post Covid-19 immunization for 15 minutes without incident. She was provided with Vaccine Information Sheet and instruction to access the V-Safe system.   [name] was instructed to call 911 with any severe reactions post vaccine: Marland Kitchen Difficulty breathing  . Swelling of face and throat  . A fast heartbeat  . A bad rash all over body  . Dizziness and weakness   Immunizations Administered    Name Date Dose VIS Date Route   Moderna COVID-19 Vaccine 03/07/2020  9:40 AM 0.5 mL 09/2019 Intramuscular   Manufacturer: Moderna   Lot: [id number]   [id number]

## 2020-04-10 ENCOUNTER — Ambulatory Visit (HOSPITAL_COMMUNITY): Payer: Self-pay

## 2020-06-28 ENCOUNTER — Other Ambulatory Visit: Payer: Self-pay | Admitting: Family Medicine

## 2020-08-01 ENCOUNTER — Ambulatory Visit: Payer: Self-pay | Admitting: Family Medicine

## 2020-08-03 ENCOUNTER — Ambulatory Visit: Payer: Self-pay | Admitting: Family Medicine

## 2020-08-08 ENCOUNTER — Other Ambulatory Visit: Payer: Self-pay | Admitting: Family Medicine

## 2020-08-08 DIAGNOSIS — F4323 Adjustment disorder with mixed anxiety and depressed mood: Secondary | ICD-10-CM

## 2020-08-16 ENCOUNTER — Encounter: Payer: Self-pay | Admitting: Family Medicine

## 2020-08-16 ENCOUNTER — Other Ambulatory Visit: Payer: Self-pay

## 2020-08-16 ENCOUNTER — Other Ambulatory Visit: Payer: Self-pay | Admitting: Family Medicine

## 2020-08-16 ENCOUNTER — Ambulatory Visit (INDEPENDENT_AMBULATORY_CARE_PROVIDER_SITE_OTHER): Payer: Medicare Other | Admitting: Family Medicine

## 2020-08-16 VITALS — BP 128/82 | HR 83 | Resp 16 | Ht 62.0 in | Wt 165.0 lb

## 2020-08-16 DIAGNOSIS — E669 Obesity, unspecified: Secondary | ICD-10-CM

## 2020-08-16 DIAGNOSIS — R1310 Dysphagia, unspecified: Secondary | ICD-10-CM | POA: Insufficient documentation

## 2020-08-16 DIAGNOSIS — Z1211 Encounter for screening for malignant neoplasm of colon: Secondary | ICD-10-CM

## 2020-08-16 DIAGNOSIS — I10 Essential (primary) hypertension: Secondary | ICD-10-CM

## 2020-08-16 DIAGNOSIS — R7303 Prediabetes: Secondary | ICD-10-CM

## 2020-08-16 DIAGNOSIS — R6881 Early satiety: Secondary | ICD-10-CM

## 2020-08-16 DIAGNOSIS — F321 Major depressive disorder, single episode, moderate: Secondary | ICD-10-CM

## 2020-08-16 DIAGNOSIS — Z23 Encounter for immunization: Secondary | ICD-10-CM | POA: Diagnosis not present

## 2020-08-16 DIAGNOSIS — R12 Heartburn: Secondary | ICD-10-CM | POA: Insufficient documentation

## 2020-08-16 DIAGNOSIS — Z1231 Encounter for screening mammogram for malignant neoplasm of breast: Secondary | ICD-10-CM

## 2020-08-16 DIAGNOSIS — E559 Vitamin D deficiency, unspecified: Secondary | ICD-10-CM

## 2020-08-16 DIAGNOSIS — R110 Nausea: Secondary | ICD-10-CM

## 2020-08-16 DIAGNOSIS — E785 Hyperlipidemia, unspecified: Secondary | ICD-10-CM | POA: Diagnosis not present

## 2020-08-16 DIAGNOSIS — R0683 Snoring: Secondary | ICD-10-CM

## 2020-08-16 NOTE — Progress Notes (Signed)
[name]     MRN: [id number]      DOB: [birth year]   HPI [name] is here for follow up and re-evaluation of chronic medical conditions, medication management and review of any available recent lab and radiology data.  Preventive health is updated, specifically  Cancer screening and Immunization.   Questions or concerns regarding consultations or procedures which the PT has had in the interim are  addressed. The PT denies any adverse reactions to current medications since the last visit.  Nausea x 1 year, intermittent and food sticking when she tries to swallow , has heartburn and reflux symptoms x 9 months, fills up quickly  ROS Denies recent fever or chills. Denies sinus pressure, nasal congestion, ear pain or sore throat. Denies chest congestion, productive cough or wheezing. Denies chest pains, palpitations and leg swelling   Denies dysuria, frequency, hesitancy or incontinence. Denies joint pain, swelling and limitation in mobility. Denies headaches, seizures, numbness, or tingling. Denies uncontrolled  depression, anxiety or insomnia. Denies skin break down or rash.   PE  BP 137/75    Pulse 83    Resp 16    Ht 5\' 2"  (1.575 m)    Wt 165 lb (74.8 kg)    SpO2 97%    BMI 30.18 kg/m   Patient alert and oriented and in no cardiopulmonary distress.  HEENT: No facial asymmetry, EOMI,     Neck supple .  Chest: Clear to auscultation bilaterally.  CVS: S1, S2 no murmurs, no S3.Regular rate.  ABD: Soft non tender.   Ext: No edema  MS: Adequate ROM spine, shoulders, hips and knees.  Skin: Intact, no ulcerations or rash noted.  Psych: Good eye contact, normal affect. Memory intact not anxious or depressed appearing.  CNS: CN 2-12 intact, power,  normal throughout.no focal deficits noted.   Assessment & Plan  Hyperlipemia Hyperlipidemia:Low fat diet discussed and encouraged.   Lipid Panel  Lab Results  Component Value Date   CHOL 253 (H) 01/26/2016   HDL 70  01/26/2016   LDLCALC 164 (H) 01/26/2016   TRIG 97 01/26/2016   CHOLHDL 3.6 01/26/2016     Updated lab needed at/ before next visit. uncontrolled , lab today  Essential hypertension Controlled, no change in medication DASH diet and commitment to daily physical activity for a minimum of 30 minutes discussed and encouraged, as a part of hypertension management. The importance of attaining a healthy weight is also discussed.  BP/Weight 08/16/2020 02/18/2020 12/30/2019 10/28/2019 09/28/2019 09/21/2018 31/54/0086  Systolic BP 761 950 932 671 245 809 983  Diastolic BP 82 72 72 72 80 80 80  Wt. (Lbs) 165 168 168 168 169 169 171.04  BMI 30.18 30.73 30.73 30.73 30.91 30.91 31.28       Depression, major, single episode, moderate (HCC) Controlled, no change in medication   Prediabetes Patient educated about the importance of limiting  Carbohydrate intake , the need to commit to daily physical activity for a minimum of 30 minutes , and to commit weight loss. The fact that changes in all these areas will reduce or eliminate all together the development of diabetes is stressed.  Updated lab needed at/ before next visit.   Diabetic Labs Latest Ref Rng & Units 04/04/2017 03/19/2017 01/26/2016 04/12/2014 09/11/2013  HbA1c <5.7 % - - 5.9(H) 6.2(H) -  Chol 125 - 200 mg/dL - - 253(H) 233(H) 258(H)  HDL >=46 mg/dL - - 70 65 58  Calc LDL <130 mg/dL - -  164(H) 138(H) 170(H)  Triglycerides <150 mg/dL - - 97 150(H) 149  Creatinine 0.44 - 1.00 mg/dL 0.90 0.76 0.89 0.88 0.89   BP/Weight 08/16/2020 02/18/2020 12/30/2019 10/28/2019 09/28/2019 09/21/2018 01/56/1537  Systolic BP 943 276 147 092 957 473 403  Diastolic BP 82 72 72 72 80 80 80  Wt. (Lbs) 165 168 168 168 169 169 171.04  BMI 30.18 30.73 30.73 30.73 30.91 30.91 31.28   No flowsheet data found.    Heartburn 9 monht history with poor appetite, obtain breath test  Dysphagia Solid dysphagia x 6 months, refer to GI

## 2020-08-16 NOTE — Assessment & Plan Note (Signed)
Hyperlipidemia:Low fat diet discussed and encouraged.   Lipid Panel  Lab Results  Component Value Date   CHOL 253 (H) 01/26/2016   HDL 70 01/26/2016   LDLCALC 164 (H) 01/26/2016   TRIG 97 01/26/2016   CHOLHDL 3.6 01/26/2016     Updated lab needed at/ before next visit. uncontrolled , lab today

## 2020-08-16 NOTE — Assessment & Plan Note (Signed)
Controlled, no change in medication DASH diet and commitment to daily physical activity for a minimum of 30 minutes discussed and encouraged, as a part of hypertension management. The importance of attaining a healthy weight is also discussed.  BP/Weight 08/16/2020 02/18/2020 12/30/2019 10/28/2019 09/28/2019 09/21/2018 98/00/1239  Systolic BP 359 409 050 256 154 884 573  Diastolic BP 82 72 72 72 80 80 80  Wt. (Lbs) 165 168 168 168 169 169 171.04  BMI 30.18 30.73 30.73 30.73 30.91 30.91 31.28

## 2020-08-16 NOTE — Assessment & Plan Note (Signed)
Controlled, no change in medication  

## 2020-08-16 NOTE — Patient Instructions (Addendum)
Annual physical exam with MD in  [facility] January, call if you need me before  Please schedule mammogram with  mobile unit per patient request at checkout. Flu vaccine in office today.  Labs today CBC lipids CMP and EGFR TSH, HBA1C  vitamin D and breath test.  You are referred to GI specialist for colonoscopy and possibly upper endoscopy very important you keep this appointment  You are referred to pulmonary specialist regarding  possible sleep apnea.  Thanks for choosing [facility], we consider it a privelige to serve you.

## 2020-08-16 NOTE — Assessment & Plan Note (Signed)
Solid dysphagia x 6 months, refer to GI

## 2020-08-16 NOTE — Assessment & Plan Note (Signed)
Patient educated about the importance of limiting  Carbohydrate intake , the need to commit to daily physical activity for a minimum of 30 minutes , and to commit weight loss. The fact that changes in all these areas will reduce or eliminate all together the development of diabetes is stressed.  Updated lab needed at/ before next visit.   Diabetic Labs Latest Ref Rng & Units 04/04/2017 03/19/2017 01/26/2016 04/12/2014 09/11/2013  HbA1c <5.7 % - - 5.9(H) 6.2(H) -  Chol 125 - 200 mg/dL - - 253(H) 233(H) 258(H)  HDL >=46 mg/dL - - 70 65 58  Calc LDL <130 mg/dL - - 164(H) 138(H) 170(H)  Triglycerides <150 mg/dL - - 97 150(H) 149  Creatinine 0.44 - 1.00 mg/dL 0.90 0.76 0.89 0.88 0.89   BP/Weight 08/16/2020 02/18/2020 12/30/2019 10/28/2019 09/28/2019 09/21/2018 09/12/5207  Systolic BP 022 336 122 449 753 005 110  Diastolic BP 82 72 72 72 80 80 80  Wt. (Lbs) 165 168 168 168 169 169 171.04  BMI 30.18 30.73 30.73 30.73 30.91 30.91 31.28   No flowsheet data found.

## 2020-08-16 NOTE — Assessment & Plan Note (Signed)
9 monht history with poor appetite, obtain breath test

## 2020-08-17 ENCOUNTER — Telehealth: Payer: Self-pay | Admitting: *Deleted

## 2020-08-17 ENCOUNTER — Other Ambulatory Visit: Payer: Self-pay | Admitting: Family Medicine

## 2020-08-17 ENCOUNTER — Encounter (INDEPENDENT_AMBULATORY_CARE_PROVIDER_SITE_OTHER): Payer: Self-pay | Admitting: *Deleted

## 2020-08-17 LAB — CBC
HCT: 38 % (ref 35.0–45.0)
Hemoglobin: 12.7 g/dL (ref 11.7–15.5)
MCH: 31.1 pg (ref 27.0–33.0)
MCHC: 33.4 g/dL (ref 32.0–36.0)
MCV: 92.9 fL (ref 80.0–100.0)
MPV: 9.5 fL (ref 7.5–12.5)
Platelets: 305 10*3/uL (ref 140–400)
RBC: 4.09 10*6/uL (ref 3.80–5.10)
RDW: 13.1 % (ref 11.0–15.0)
WBC: 7.2 10*3/uL (ref 3.8–10.8)

## 2020-08-17 LAB — COMPLETE METABOLIC PANEL WITH GFR
AG Ratio: 1.3 (calc) (ref 1.0–2.5)
ALT: 22 U/L (ref 6–29)
AST: 26 U/L (ref 10–35)
Albumin: 4.4 g/dL (ref 3.6–5.1)
Alkaline phosphatase (APISO): 54 U/L (ref 37–153)
BUN: 10 mg/dL (ref 7–25)
CO2: 31 mmol/L (ref 20–32)
Calcium: 10.1 mg/dL (ref 8.6–10.4)
Chloride: 103 mmol/L (ref 98–110)
Creat: 0.91 mg/dL (ref 0.50–0.99)
GFR, Est African American: 75 mL/min/{1.73_m2} (ref >=60)
GFR, Est Non African American: 65 mL/min/{1.73_m2} (ref >=60)
Globulin: 3.3 g/dL (calc) (ref 1.9–3.7)
Glucose, Bld: 99 mg/dL (ref 65–99)
Potassium: 4.3 mmol/L (ref 3.5–5.3)
Sodium: 139 mmol/L (ref 135–146)
Total Bilirubin: 0.4 mg/dL (ref 0.2–1.2)
Total Protein: 7.7 g/dL (ref 6.1–8.1)

## 2020-08-17 LAB — HEMOGLOBIN A1C
Hgb A1c MFr Bld: 5.8 % of total Hgb — ABNORMAL HIGH (ref <5.7)
Mean Plasma Glucose: 120 (calc)
eAG (mmol/L): 6.6 (calc)

## 2020-08-17 LAB — LIPID PANEL
Cholesterol: 246 mg/dL — ABNORMAL HIGH (ref <200)
HDL: 70 mg/dL (ref >=50)
LDL Cholesterol (Calc): 151 mg/dL (calc) — ABNORMAL HIGH
Non-HDL Cholesterol (Calc): 176 mg/dL (calc) — ABNORMAL HIGH (ref <130)
Total CHOL/HDL Ratio: 3.5 (calc) (ref <5.0)
Triglycerides: 129 mg/dL (ref <150)

## 2020-08-17 LAB — VITAMIN D 25 HYDROXY (VIT D DEFICIENCY, FRACTURES): Vit D, 25-Hydroxy: 37 ng/mL (ref 30–100)

## 2020-08-17 LAB — H. PYLORI BREATH TEST: H. pylori Breath Test: NOT DETECTED

## 2020-08-17 LAB — TSH: TSH: 1.12 mIU/L (ref 0.40–4.50)

## 2020-08-17 MED ORDER — PANTOPRAZOLE SODIUM 20 MG PO TBEC: 20.0000 mg | DELAYED_RELEASE_TABLET | Freq: Every day | ORAL | 3 refills | Status: DC

## 2020-08-17 MED ORDER — ROSUVASTATIN CALCIUM 40 MG PO TABS: 40.0000 mg | ORAL_TABLET | Freq: Every day | ORAL | 1 refills | Status: DC

## 2020-08-17 NOTE — Telephone Encounter (Signed)
More likely from being anxious about coming off the aspirin. She has never had intestinal bleed and if she feels more comfortable and  wants to stay on it the option is hers. Just that new recommendation is that she does not Need to take it

## 2020-08-17 NOTE — Telephone Encounter (Signed)
Pt called she stated since coming off the aspirin sometimes her heart feels like it is fluttering would like to know is this coming from being off the aspirin?

## 2020-08-17 NOTE — Telephone Encounter (Signed)
Attempted to call pt NA NVM  

## 2020-08-22 ENCOUNTER — Other Ambulatory Visit: Payer: Self-pay | Admitting: *Deleted

## 2020-08-22 MED ORDER — GABAPENTIN 300 MG PO CAPS
300.0000 mg | ORAL_CAPSULE | Freq: Two times a day (BID) | ORAL | 4 refills | Status: DC
Start: 2020-08-22 — End: 2021-05-07

## 2020-08-22 NOTE — Telephone Encounter (Signed)
Pt notified with verbal understanding  °

## 2020-08-23 ENCOUNTER — Encounter (INDEPENDENT_AMBULATORY_CARE_PROVIDER_SITE_OTHER): Payer: Self-pay | Admitting: *Deleted

## 2020-08-24 ENCOUNTER — Ambulatory Visit: Payer: Medicare Other

## 2020-09-06 ENCOUNTER — Other Ambulatory Visit: Payer: Self-pay | Admitting: Family Medicine

## 2020-09-11 ENCOUNTER — Other Ambulatory Visit: Payer: Self-pay | Admitting: Family Medicine

## 2020-09-11 DIAGNOSIS — F4323 Adjustment disorder with mixed anxiety and depressed mood: Secondary | ICD-10-CM

## 2020-09-25 ENCOUNTER — Ambulatory Visit (INDEPENDENT_AMBULATORY_CARE_PROVIDER_SITE_OTHER): Payer: Medicare Other | Admitting: Nurse Practitioner

## 2020-09-25 ENCOUNTER — Encounter: Payer: Self-pay | Admitting: Nurse Practitioner

## 2020-09-25 ENCOUNTER — Other Ambulatory Visit: Payer: Self-pay

## 2020-09-25 VITALS — BP 117/73 | HR 101 | Temp 97.0°F | Resp 16

## 2020-09-25 DIAGNOSIS — M542 Cervicalgia: Secondary | ICD-10-CM | POA: Diagnosis not present

## 2020-09-25 MED ORDER — PREDNISONE 10 MG PO TABS: ORAL_TABLET | ORAL | 0 refills | Status: AC

## 2020-09-25 MED ORDER — MELOXICAM 15 MG PO TABS: 15.0000 mg | ORAL_TABLET | Freq: Every day | ORAL | 0 refills | Status: AC

## 2020-09-25 MED ORDER — TIZANIDINE HCL 4 MG PO TABS: 4.0000 mg | ORAL_TABLET | Freq: Four times a day (QID) | ORAL | 0 refills | Status: DC | PRN

## 2020-09-25 NOTE — Assessment & Plan Note (Addendum)
-  pinched nerve vs muscle strain -will treat for muscle strain -Rx. Prednisone -Rx. meloxicam -Rx. Tizanidine -if no improvement, will consider ortho referral

## 2020-09-25 NOTE — Progress Notes (Signed)
Acute Office Visit  Subjective:    Patient ID: [name], female    DOB: 1951/03/29, 69 y.o.   MRN: [id number]  Chief Complaint  Patient presents with  . Neck Pain    lower left side into L shoulder, ongoing x1 weeks    HPI Patient is in today for left neck/shoulder pain.  Her pain started on Thanksgiving.  The pain is a 10/10 and sometimes radiates into her arm. She took [facility] powder, tylenol, [employer], and another medication that she cannot recall.  Her pain is worse when she rotates her neck to the right.  Past Medical History:  Diagnosis Date  . Allergy   . Anxiety   . GERD (gastroesophageal reflux disease)   . Hyperlipidemia   . Osteoarthritis     Past Surgical History:  Procedure Laterality Date  . cyst removed from right breast    . LAPAROSCOPIC SALPINGO OOPHERECTOMY Right 04/09/2017   Procedure: LAPAROSCOPIC RIGHT SALPINGO OOPHORECTOMY; RIGHT OVARIAN BENIGN CYSTIC TERATOMA;  Surgeon: Florian Buff, MD;  Location: [facility];  Service: Gynecology;  Laterality: Right;  . PARTIAL HYSTERECTOMY    . TUBAL LIGATION      Family History  Problem Relation Age of Onset  . Heart disease Mother   . Heart disease Father   . Diabetes Father   . Hypertension Sister   . Parkinson's disease Brother   . Heart disease Maternal Grandmother   . Cancer Paternal Grandmother        breast  . Diabetes Paternal Grandfather   . Hypertension Sister   . Hypertension Sister   . Down syndrome Son   . Eczema Son   . Post-traumatic stress disorder Daughter     Social History   Socioeconomic History  . Marital status: Single    Spouse name: Not on file  . Number of children: 3  . Years of education: 88  . Highest education level: 12th grade  Occupational History  . Not on file  Tobacco Use  . Smoking status: Never Smoker  . Smokeless tobacco: Never Used  Vaping Use  . Vaping Use: Never used  Substance and Sexual Activity  . Alcohol use: Yes    Comment: occasionally wine  . Drug  use: No  . Sexual activity: Not Currently    Birth control/protection: Surgical    Comment: hyst  Other Topics Concern  . Not on file  Social History Narrative  . Not on file   Social Determinants of Health   Financial Resource Strain:   . Difficulty of Paying Living Expenses: Not on file  Food Insecurity:   . Worried About Charity fundraiser in the Last Year: Not on file  . Ran Out of Food in the Last Year: Not on file  Transportation Needs:   . Lack of Transportation (Medical): Not on file  . Lack of Transportation (Non-Medical): Not on file  Physical Activity:   . Days of Exercise per Week: Not on file  . Minutes of Exercise per Session: Not on file  Stress:   . Feeling of Stress : Not on file  Social Connections:   . Frequency of Communication with Friends and Family: Not on file  . Frequency of Social Gatherings with Friends and Family: Not on file  . Attends Religious Services: Not on file  . Active Member of Clubs or Organizations: Not on file  . Attends Archivist Meetings: Not on file  . Marital Status: Not  on file  Intimate Partner Violence:   . Fear of Current or Ex-Partner: Not on file  . Emotionally Abused: Not on file  . Physically Abused: Not on file  . Sexually Abused: Not on file    Outpatient Medications Prior to Visit  Medication Sig Dispense Refill  . amLODipine (NORVASC) 5 MG tablet TAKE ONE TABLET BY MOUTH ONCE DAILY. 30 tablet 4  . azelastine (ASTELIN) 0.1 % nasal spray Place 2 sprays into both nostrils 2 (two) times daily. Use in each nostril as directed 30 mL 5  . citalopram (CELEXA) 40 MG tablet TAKE ONE TABLET BY MOUTH ONCE DAILY. 30 tablet 0  . fluticasone (FLONASE) 50 MCG/ACT nasal spray Place 2 sprays into both nostrils daily. Can do in the morning, or do one spray in more and one at night. 16 g 2  . gabapentin (NEURONTIN) 300 MG capsule Take 1 capsule (300 mg total) by mouth 2 (two) times daily. 60 capsule 4  . loratadine  (CLARITIN) 10 MG tablet TAKE (1) TABLET BY MOUTH ONCE DAILY AS NEEDED FOR ALLERGIES. 90 tablet 0  . Omega-3 Fatty Acids (FISH OIL CONCENTRATE) 300 MG CAPS Take 600 mg by mouth daily.    Marland Kitchen oxybutynin (DITROPAN) 5 MG tablet TAKE (1) TABLET BY MOUTH TWICE DAILY. 60 tablet 5  . pantoprazole (PROTONIX) 20 MG tablet Take 1 tablet (20 mg total) by mouth daily. 30 tablet 3  . rosuvastatin (CRESTOR) 40 MG tablet Take 1 tablet (40 mg total) by mouth daily. 90 tablet 1  . vitamin C (ASCORBIC ACID) 500 MG tablet Take 500 mg by mouth daily.    . vitamin E 1000 UNIT capsule Take 1,000 Units by mouth daily.     No facility-administered medications prior to visit.    Allergies  Allergen Reactions  . Ibuprofen Palpitations and Other (See Comments)    Rapid heart rate.    Review of Systems  Constitutional: Negative.   Respiratory: Negative.   Cardiovascular: Negative.   Musculoskeletal: Positive for neck pain.       Per HPI       Objective:    Physical Exam Constitutional:      Appearance: Normal appearance.  Neck:     Comments: With ROM exercises; worse with rotation to her right Musculoskeletal:     Cervical back: Tenderness present.  Neurological:     Mental Status: She is alert.     There were no vitals taken for this visit. Wt Readings from Last 3 Encounters:  08/16/20 165 lb (74.8 kg)  02/18/20 168 lb (76.2 kg)  12/30/19 168 lb (76.2 kg)    Health Maintenance Due  Topic Date Due  . COLONOSCOPY  08/02/2018    There are no preventive care reminders to display for this patient.   Lab Results  Component Value Date   TSH 1.12 08/16/2020   Lab Results  Component Value Date   WBC 7.2 08/16/2020   HGB 12.7 08/16/2020   HCT 38.0 08/16/2020   MCV 92.9 08/16/2020   PLT 305 08/16/2020   Lab Results  Component Value Date   NA 139 08/16/2020   K 4.3 08/16/2020   CO2 31 08/16/2020   GLUCOSE 99 08/16/2020   BUN 10 08/16/2020   CREATININE 0.91 08/16/2020   BILITOT 0.4  08/16/2020   ALKPHOS 41 04/04/2017   AST 26 08/16/2020   ALT 22 08/16/2020   PROT 7.7 08/16/2020   ALBUMIN 3.8 04/04/2017   CALCIUM 10.1 08/16/2020  ANIONGAP 6 04/04/2017   Lab Results  Component Value Date   CHOL 246 (H) 08/16/2020   Lab Results  Component Value Date   HDL 70 08/16/2020   Lab Results  Component Value Date   LDLCALC 151 (H) 08/16/2020   Lab Results  Component Value Date   TRIG 129 08/16/2020   Lab Results  Component Value Date   CHOLHDL 3.5 08/16/2020   Lab Results  Component Value Date   HGBA1C 5.8 (H) 08/16/2020       Assessment & Plan:   Problem List Items Addressed This Visit    None       No orders of the defined types were placed in this encounter.    Noreene Larsson, NP

## 2020-09-28 ENCOUNTER — Encounter: Payer: Self-pay | Admitting: Family Medicine

## 2020-09-28 ENCOUNTER — Other Ambulatory Visit: Payer: Self-pay

## 2020-09-28 ENCOUNTER — Ambulatory Visit (INDEPENDENT_AMBULATORY_CARE_PROVIDER_SITE_OTHER): Payer: Medicare Other | Admitting: Family Medicine

## 2020-09-28 VITALS — BP 128/82 | Ht 62.0 in | Wt 165.0 lb

## 2020-09-28 DIAGNOSIS — Z0000 Encounter for general adult medical examination without abnormal findings: Secondary | ICD-10-CM

## 2020-09-28 NOTE — Patient Instructions (Addendum)
[name] , Thank you for taking time to come for your Medicare Wellness Visit. I appreciate your ongoing commitment to your health goals. Please review the following plan we discussed and let me know if I can assist you in the future.   Screening recommendations/referrals: Colonoscopy: Scheduled Mammogram: Scheduled Bone Density: Complete Recommended yearly ophthalmology/optometry visit for glaucoma screening and checkup Recommended yearly dental visit for hygiene and checkup  Vaccinations: Influenza vaccine: Fall 2022 Pneumococcal vaccine: Complete Tdap vaccine: Due Shingles vaccine: Declined  Advanced directives: No  Conditions/risks identified: None  Next appointment: 11/15/20 @ 8 am  Preventive Care 65 Years and Older, Female Preventive care refers to lifestyle choices and visits with your health care provider that can promote health and wellness. What does preventive care include?  A yearly physical exam. This is also called an annual well check.  Dental exams once or twice a year.  Routine eye exams. Ask your health care provider how often you should have your eyes checked.  Personal lifestyle choices, including:  Daily care of your teeth and gums.  Regular physical activity.  Eating a healthy diet.  Avoiding tobacco and drug use.  Limiting alcohol use.  Practicing safe sex.  Taking low-dose aspirin every day.  Taking vitamin and mineral supplements as recommended by your health care provider. What happens during an annual well check? The services and screenings done by your health care provider during your annual well check will depend on your age, overall health, lifestyle risk factors, and family history of disease. Counseling  Your health care provider may ask you questions about your:  Alcohol use.  Tobacco use.  Drug use.  Emotional well-being.  Home and relationship well-being.  Sexual activity.  Eating habits.  History of  falls.  Memory and ability to understand (cognition).  Work and work Statistician.  Reproductive health. Screening  You may have the following tests or measurements:  Height, weight, and BMI.  Blood pressure.  Lipid and cholesterol levels. These may be checked every 5 years, or more frequently if you are over 84 years old.  Skin check.  Lung cancer screening. You may have this screening every year starting at age 53 if you have a 30-pack-year history of smoking and currently smoke or have quit within the past 15 years.  Fecal occult blood test (FOBT) of the stool. You may have this test every year starting at age 83.  Flexible sigmoidoscopy or colonoscopy. You may have a sigmoidoscopy every 5 years or a colonoscopy every 10 years starting at age 79.  Hepatitis C blood test.  Hepatitis B blood test.  Sexually transmitted disease (STD) testing.  Diabetes screening. This is done by checking your blood sugar (glucose) after you have not eaten for a while (fasting). You may have this done every 1-3 years.  Bone density scan. This is done to screen for osteoporosis. You may have this done starting at age 94.  Mammogram. This may be done every 1-2 years. Talk to your health care provider about how often you should have regular mammograms. Talk with your health care provider about your test results, treatment options, and if necessary, the need for more tests. Vaccines  Your health care provider may recommend certain vaccines, such as:  Influenza vaccine. This is recommended every year.  Tetanus, diphtheria, and acellular pertussis (Tdap, Td) vaccine. You may need a Td booster every 10 years.  Zoster vaccine. You may need this after age 60.  Pneumococcal 13-valent conjugate (PCV13) vaccine.  One dose is recommended after age 24.  Pneumococcal polysaccharide (PPSV23) vaccine. One dose is recommended after age 61. Talk to your health care provider about which screenings and  vaccines you need and how often you need them. This information is not intended to replace advice given to you by your health care provider. Make sure you discuss any questions you have with your health care provider. Document Released: 11/10/2015 Document Revised: 07/03/2016 Document Reviewed: 08/15/2015 Elsevier Interactive Patient Education  2017 [place] Prevention in the Home Falls can cause injuries. They can happen to people of all ages. There are many things you can do to make your home safe and to help prevent falls. What can I do on the outside of my home?  Regularly fix the edges of walkways and driveways and fix any cracks.  Remove anything that might make you trip as you walk through a door, such as a raised step or threshold.  Trim any bushes or trees on the path to your home.  Use bright outdoor lighting.  Clear any walking paths of anything that might make someone trip, such as rocks or tools.  Regularly check to see if handrails are loose or broken. Make sure that both sides of any steps have handrails.  Any raised decks and porches should have guardrails on the edges.  Have any leaves, snow, or ice cleared regularly.  Use sand or salt on walking paths during winter.  Clean up any spills in your garage right away. This includes oil or grease spills. What can I do in the bathroom?  Use night lights.  Install grab bars by the toilet and in the tub and shower. Do not use towel bars as grab bars.  Use non-skid mats or decals in the tub or shower.  If you need to sit down in the shower, use a plastic, non-slip stool.  Keep the floor dry. Clean up any water that spills on the floor as soon as it happens.  Remove soap buildup in the tub or shower regularly.  Attach bath mats securely with double-sided non-slip rug tape.  Do not have throw rugs and other things on the floor that can make you trip. What can I do in the bedroom?  Use night  lights.  Make sure that you have a light by your bed that is easy to reach.  Do not use any sheets or blankets that are too big for your bed. They should not hang down onto the floor.  Have a firm chair that has side arms. You can use this for support while you get dressed.  Do not have throw rugs and other things on the floor that can make you trip. What can I do in the kitchen?  Clean up any spills right away.  Avoid walking on wet floors.  Keep items that you use a lot in easy-to-reach places.  If you need to reach something above you, use a strong step stool that has a grab bar.  Keep electrical cords out of the way.  Do not use floor polish or wax that makes floors slippery. If you must use wax, use non-skid floor wax.  Do not have throw rugs and other things on the floor that can make you trip. What can I do with my stairs?  Do not leave any items on the stairs.  Make sure that there are handrails on both sides of the stairs and use them. Fix handrails that are broken  or loose. Make sure that handrails are as long as the stairways.  Check any carpeting to make sure that it is firmly attached to the stairs. Fix any carpet that is loose or worn.  Avoid having throw rugs at the top or bottom of the stairs. If you do have throw rugs, attach them to the floor with carpet tape.  Make sure that you have a light switch at the top of the stairs and the bottom of the stairs. If you do not have them, ask someone to add them for you. What else can I do to help prevent falls?  Wear shoes that:  Do not have high heels.  Have rubber bottoms.  Are comfortable and fit you well.  Are closed at the toe. Do not wear sandals.  If you use a stepladder:  Make sure that it is fully opened. Do not climb a closed stepladder.  Make sure that both sides of the stepladder are locked into place.  Ask someone to hold it for you, if possible.  Clearly mark and make sure that you can  see:  Any grab bars or handrails.  First and last steps.  Where the edge of each step is.  Use tools that help you move around (mobility aids) if they are needed. These include:  Canes.  Walkers.  Scooters.  Crutches.  Turn on the lights when you go into a dark area. Replace any light bulbs as soon as they burn out.  Set up your furniture so you have a clear path. Avoid moving your furniture around.  If any of your floors are uneven, fix them.  If there are any pets around you, be aware of where they are.  Review your medicines with your doctor. Some medicines can make you feel dizzy. This can increase your chance of falling. Ask your doctor what other things that you can do to help prevent falls. This information is not intended to replace advice given to you by your health care provider. Make sure you discuss any questions you have with your health care provider. Document Released: 08/10/2009 Document Revised: 03/21/2016 Document Reviewed: 11/18/2014 Elsevier Interactive Patient Education  2017 [employer].

## 2020-09-28 NOTE — Progress Notes (Addendum)
Subjective:   [name] is a 69 y.o. female who presents for Medicare Annual (Subsequent) preventive examination.   Participants: Nurse/CMA for intake and visit; Patient and Provider for Visit and Wrap up  Method of visit: Telephone  Location of Patient: Home Location of Provider: Office Consent was obtain for visit over the telephone. Services rendered by provider: Visit was performed via telephone   I verified that I am speaking with the correct person using two identifiers.   Review of Systems Cardiac Risk Factors include: advanced age (>74men, >18 women);obesity (BMI >30kg/m2)     Objective:    Today's Vitals   09/28/20 0928 09/28/20 0932 09/28/20 0943  BP: 128/82    Weight: 165 lb (74.8 kg)    Height: 5\' 2"  (1.575 m)    PainSc: 8  0-No pain 8   PainLoc: Shoulder     Body mass index is 30.18 kg/m.  Advanced Directives 09/28/2020 09/28/2019 09/21/2018 04/04/2017 03/19/2017 09/11/2014  Does Patient Have a Medical Advance Directive? No No Yes;No No No No  Does patient want to make changes to medical advance directive? - - Yes (ED - Information included in AVS) - - -  Would patient like information on creating a medical advance directive? No - Patient declined No - Patient declined No - Patient declined No - Patient declined - No - patient declined information    Current Medications (verified) Outpatient Encounter Medications as of 09/28/2020  Medication Sig  . amLODipine (NORVASC) 5 MG tablet TAKE ONE TABLET BY MOUTH ONCE DAILY.  Marland Kitchen azelastine (ASTELIN) 0.1 % nasal spray Place 2 sprays into both nostrils 2 (two) times daily. Use in each nostril as directed  . citalopram (CELEXA) 40 MG tablet TAKE ONE TABLET BY MOUTH ONCE DAILY.  . fluticasone (FLONASE) 50 MCG/ACT nasal spray Place 2 sprays into both nostrils daily. Can do in the morning, or do one spray in more and one at night.  . gabapentin (NEURONTIN) 300 MG capsule Take 1 capsule (300 mg total) by mouth 2 (two)  times daily.  Marland Kitchen loratadine (CLARITIN) 10 MG tablet TAKE (1) TABLET BY MOUTH ONCE DAILY AS NEEDED FOR ALLERGIES.  Marland Kitchen meloxicam (MOBIC) 15 MG tablet Take 1 tablet (15 mg total) by mouth daily for 10 days.  . Omega-3 Fatty Acids (FISH OIL CONCENTRATE) 300 MG CAPS Take 600 mg by mouth daily.  Marland Kitchen oxybutynin (DITROPAN) 5 MG tablet TAKE (1) TABLET BY MOUTH TWICE DAILY.  . pantoprazole (PROTONIX) 20 MG tablet Take 1 tablet (20 mg total) by mouth daily.  . predniSONE (DELTASONE) 10 MG tablet Take 6 tablets (60 mg total) by mouth daily with breakfast for 1 day, THEN 5 tablets (50 mg total) daily with breakfast for 1 day, THEN 4 tablets (40 mg total) daily with breakfast for 1 day, THEN 3 tablets (30 mg total) daily with breakfast for 1 day, THEN 2 tablets (20 mg total) daily with breakfast for 1 day, THEN 1 tablet (10 mg total) daily with breakfast for 1 day.  . rosuvastatin (CRESTOR) 40 MG tablet Take 1 tablet (40 mg total) by mouth daily.  Marland Kitchen tiZANidine (ZANAFLEX) 4 MG tablet Take 1 tablet (4 mg total) by mouth every 6 (six) hours as needed for muscle spasms.  . vitamin C (ASCORBIC ACID) 500 MG tablet Take 500 mg by mouth daily.  . vitamin E 1000 UNIT capsule Take 1,000 Units by mouth daily.   No facility-administered encounter medications on file as of 09/28/2020.  Allergies (verified) Ibuprofen   History: Past Medical History:  Diagnosis Date  . Allergy   . Anxiety   . GERD (gastroesophageal reflux disease)   . Hyperlipidemia   . Osteoarthritis    Past Surgical History:  Procedure Laterality Date  . cyst removed from right breast    . LAPAROSCOPIC SALPINGO OOPHERECTOMY Right 04/09/2017   Procedure: LAPAROSCOPIC RIGHT SALPINGO OOPHORECTOMY; RIGHT OVARIAN BENIGN CYSTIC TERATOMA;  Surgeon: Florian Buff, MD;  Location: [facility];  Service: Gynecology;  Laterality: Right;  . PARTIAL HYSTERECTOMY    . TUBAL LIGATION     Family History  Problem Relation Age of Onset  . Heart disease Mother   .  Heart disease Father   . Diabetes Father   . Hypertension Sister   . Parkinson's disease Brother   . Heart disease Maternal Grandmother   . Cancer Paternal Grandmother        breast  . Diabetes Paternal Grandfather   . Hypertension Sister   . Hypertension Sister   . Down syndrome Son   . Eczema Son   . Post-traumatic stress disorder Daughter    Social History   Socioeconomic History  . Marital status: Single    Spouse name: Not on file  . Number of children: 3  . Years of education: 28  . Highest education level: 12th grade  Occupational History  . Not on file  Tobacco Use  . Smoking status: Never Smoker  . Smokeless tobacco: Never Used  Vaping Use  . Vaping Use: Never used  Substance and Sexual Activity  . Alcohol use: Yes    Comment: occasionally wine  . Drug use: No  . Sexual activity: Not Currently    Birth control/protection: Surgical    Comment: hyst  Other Topics Concern  . Not on file  Social History Narrative  . Not on file   Social Determinants of Health   Financial Resource Strain: Low Risk   . Difficulty of Paying Living Expenses: Not hard at all  Food Insecurity: No Food Insecurity  . Worried About Charity fundraiser in the Last Year: Never true  . Ran Out of Food in the Last Year: Never true  Transportation Needs: No Transportation Needs  . Lack of Transportation (Medical): No  . Lack of Transportation (Non-Medical): No  Physical Activity: Sufficiently Active  . Days of Exercise per Week: 7 days  . Minutes of Exercise per Session: 30 min  Stress: No Stress Concern Present  . Feeling of Stress : Not at all  Social Connections: Socially Integrated  . Frequency of Communication with Friends and Family: More than three times a week  . Frequency of Social Gatherings with Friends and Family: Once a week  . Attends Religious Services: More than 4 times per year  . Active Member of Clubs or Organizations: Yes  . Attends Archivist  Meetings: More than 4 times per year  . Marital Status: Living with partner    Tobacco Counseling Counseling given: Yes   Clinical Intake:  Pre-visit preparation completed: Yes  Pain : 0-10 Pain Score: 8  Pain Type: Acute pain Pain Location: Shoulder Pain Orientation: Left Pain Descriptors / Indicators: Stabbing, Pins and needles Pain Onset: In the past 7 days Pain Frequency: Intermittent Pain Relieving Factors: medication Effect of Pain on Daily Activities: mild  Pain Relieving Factors: medication  BMI - recorded: 30.18 Nutritional Status: BMI > 30  Obese Nutritional Risks: None Diabetes: No  How often do  you need to have someone help you when you read instructions, pamphlets, or other written materials from your doctor or pharmacy?: 1 - Never What is the last grade level you completed in school?: 12  Diabetic? No   Interpreter Needed?: No  Information entered by :: Laretta Bolster, LPN   Activities of Daily Living In your present state of health, do you have any difficulty performing the following activities: 09/28/2020  Hearing? N  Vision? N  Difficulty concentrating or making decisions? N  Walking or climbing stairs? N  Dressing or bathing? N  Doing errands, shopping? N  Preparing Food and eating ? N  Using the Toilet? N  In the past six months, have you accidently leaked urine? N  Do you have problems with loss of bowel control? N  Managing your Medications? N  Managing your Finances? N  Housekeeping or managing your Housekeeping? N  Some recent data might be hidden    Patient Care Team: Fayrene Helper, MD as PCP - General  Indicate any recent Medical Services you may have received from other than [facility] providers in the past year (date may be approximate).     Assessment:   This is a routine wellness examination for [name].  Hearing/Vision screen No exam data present  Dietary issues and exercise activities discussed: Current Exercise Habits:  Home exercise routine, Type of exercise: walking, Time (Minutes): 30, Frequency (Times/Week): 7, Weekly Exercise (Minutes/Week): 210, Intensity: Mild  Goals    . Increase physical activity      Depression Screen PHQ 2/9 Scores 09/28/2020 09/28/2020 09/25/2020 08/16/2020 02/18/2020 12/30/2019 10/28/2019  PHQ - 2 Score 0 0 0 0 0 1 5  PHQ- 9 Score 1 1 1 1  0 - 10    Fall Risk Fall Risk  09/28/2020 09/25/2020 08/16/2020 02/18/2020 12/30/2019  Falls in the past year? 0 0 0 0 0  Number falls in past yr: 0 0 0 0 0  Injury with Fall? 0 0 0 0 0  Risk for fall due to : No Fall Risks No Fall Risks - No Fall Risks -  Follow up Falls evaluation completed Falls evaluation completed - - -    Any stairs in or around the home? No  If so, are there any without handrails? No  Home free of loose throw rugs in walkways, pet beds, electrical cords, etc? Yes  Adequate lighting in your home to reduce risk of falls? Yes   ASSISTIVE DEVICES UTILIZED TO PREVENT FALLS:  Life alert? No  Use of a cane, walker or w/c? No  Grab bars in the bathroom? No  Shower chair or bench in shower? No  Elevated toilet seat or a handicapped toilet? No   TIMED UP AND GO:  Was the test performed? No .  Length of time to ambulate n/a.    Cognitive Function:     6CIT Screen 09/28/2020 09/28/2019 09/21/2018  What Year? 0 points 0 points 0 points  What month? 0 points 0 points 0 points  What time? 0 points 0 points 0 points  Count back from 20 0 points 0 points 0 points  Months in reverse 0 points 0 points 0 points  Repeat phrase 0 points 0 points 2 points  Total Score 0 0 2    Immunizations Immunization History  Administered Date(s) Administered  . Fluad Quad(high Dose 65+) 06/28/2019, 08/16/2020  . Influenza Split 07/13/2012, 08/13/2016  . Influenza, High Dose Seasonal PF 09/07/2018  . Moderna SARS-COVID-2 Vaccination 02/05/2020,  03/07/2020  . Pneumococcal Conjugate-13 03/06/2017  . Pneumococcal Polysaccharide-23  09/08/2018  . Td 05/17/2008  . Zoster 07/13/2012    TDAP status: Up to date Flu Vaccine status: Up to date Pneumococcal vaccine status: Up to date Covid-19 vaccine status: Completed vaccines  Qualifies for Shingles Vaccine? Yes   Zostavax completed No   Shingrix Completed?: No.    Education has been provided regarding the importance of this vaccine. Patient has been advised to call insurance company to determine out of pocket expense if they have not yet received this vaccine. Advised may also receive vaccine at local pharmacy or Health Dept. Verbalized acceptance and understanding.  Screening Tests Health Maintenance  Topic Date Due  . TETANUS/TDAP  05/17/2018  . COLONOSCOPY  08/02/2018  . MAMMOGRAM  04/06/2021  . INFLUENZA VACCINE  Completed  . DEXA SCAN  Completed  . COVID-19 Vaccine  Completed  . Hepatitis C Screening  Completed  . PNA vac Low Risk Adult  Completed    Health Maintenance  Health Maintenance Due  Topic Date Due  . TETANUS/TDAP  05/17/2018  . COLONOSCOPY  08/02/2018    Colorectal cancer screening: Completed scheduled.. Repeat every 10 years Mammogram status: Completed scheduled. Repeat every year Bone Density status: Completed 11/16/17. Results reflect: Bone density results: NORMAL. Repeat every 5 years.  Lung Cancer Screening: (Low Dose CT Chest recommended if Age 36-80 years, 30 pack-year currently smoking OR have quit w/in 15years.) does not qualify.   Lung Cancer Screening Referral: n/a  Additional Screening:  Hepatitis C Screening: does not qualify  Vision Screening: Recommended annual ophthalmology exams for early detection of glaucoma and other disorders of the eye. Is the patient up to date with their annual eye exam?  Yes  Who is the provider or what is the name of the office in which the patient attends annual eye exams? Dr. Jorja Loa  If pt is not established with a provider, would they like to be referred to a provider to establish care? No  .   Dental Screening: Recommended annual dental exams for proper oral hygiene  Community Resource Referral / Chronic Care Management: CRR required this visit?  No   CCM required this visit?  No      Plan:     1. Encounter for Medicare annual wellness exam  I have personally reviewed and noted the following in the patient's chart:   . Medical and social history . Use of alcohol, tobacco or illicit drugs  . Current medications and supplements . Functional ability and status . Nutritional status . Physical activity . Advanced directives . List of other physicians . Hospitalizations, surgeries, and ER visits in previous 12 months . Vitals . Screenings to include cognitive, depression, and falls . Referrals and appointments  In addition, I have reviewed and discussed with patient certain preventive protocols, quality metrics, and best practice recommendations. A written personalized care plan for preventive services as well as general preventive health recommendations were provided to patient.     Perlie Mayo, NP   09/28/2020   Nurse Notes: AWV conducted by nurse in the office by telephone. Patient gave consent to telehealth visit via audio. Provider also in the office. Visit took 25 minutes to complete.   Agree with Documentation

## 2020-10-05 ENCOUNTER — Ambulatory Visit: Payer: Medicare Other | Attending: Internal Medicine

## 2020-10-05 DIAGNOSIS — Z23 Encounter for immunization: Secondary | ICD-10-CM

## 2020-10-05 NOTE — Progress Notes (Signed)
   Covid-19 Vaccination Clinic  Name:  [name]    MRN: [id number] DOB: Jun 11, 1951  10/05/2020  [name] was observed post Covid-19 immunization for 15 minutes without incident. She was provided with Vaccine Information Sheet and instruction to access the V-Safe system.   [name] was instructed to call 911 with any severe reactions post vaccine: Marland Kitchen Difficulty breathing  . Swelling of face and throat  . A fast heartbeat  . A bad rash all over body  . Dizziness and weakness   Immunizations Administered    No immunizations on file.

## 2020-10-12 ENCOUNTER — Other Ambulatory Visit: Payer: Self-pay

## 2020-10-12 ENCOUNTER — Other Ambulatory Visit (INDEPENDENT_AMBULATORY_CARE_PROVIDER_SITE_OTHER): Payer: Self-pay

## 2020-10-12 ENCOUNTER — Telehealth (INDEPENDENT_AMBULATORY_CARE_PROVIDER_SITE_OTHER): Payer: Self-pay

## 2020-10-12 ENCOUNTER — Ambulatory Visit (INDEPENDENT_AMBULATORY_CARE_PROVIDER_SITE_OTHER): Payer: Medicare Other | Admitting: Gastroenterology

## 2020-10-12 ENCOUNTER — Encounter (INDEPENDENT_AMBULATORY_CARE_PROVIDER_SITE_OTHER): Payer: Self-pay

## 2020-10-12 ENCOUNTER — Encounter (INDEPENDENT_AMBULATORY_CARE_PROVIDER_SITE_OTHER): Payer: Self-pay | Admitting: Gastroenterology

## 2020-10-12 VITALS — BP 121/74 | HR 86 | Temp 98.0°F | Ht 62.0 in | Wt 164.0 lb

## 2020-10-12 DIAGNOSIS — R6881 Early satiety: Secondary | ICD-10-CM | POA: Diagnosis not present

## 2020-10-12 DIAGNOSIS — K219 Gastro-esophageal reflux disease without esophagitis: Secondary | ICD-10-CM

## 2020-10-12 DIAGNOSIS — R1310 Dysphagia, unspecified: Secondary | ICD-10-CM | POA: Diagnosis not present

## 2020-10-12 DIAGNOSIS — Z1212 Encounter for screening for malignant neoplasm of rectum: Secondary | ICD-10-CM

## 2020-10-12 DIAGNOSIS — Z1211 Encounter for screening for malignant neoplasm of colon: Secondary | ICD-10-CM

## 2020-10-12 MED ORDER — NA SULFATE-K SULFATE-MG SULF 17.5-3.13-1.6 GM/177ML PO SOLN: 354.0000 mL | Freq: Once | ORAL | 0 refills | Status: AC

## 2020-10-12 NOTE — Patient Instructions (Addendum)
Schedule EGD and colonoscopy Continue pantoprazole 20 mg qday

## 2020-10-12 NOTE — Telephone Encounter (Signed)
[name], CMA  

## 2020-10-12 NOTE — Progress Notes (Signed)
[name], M.D. Gastroenterology & Hepatology [facility] [address] [place], [place] [address] Primary Care Physician: Fayrene Helper, MD [address] [place] [address]  Referring MD: Tula Nakayama, MD  I will communicate my assessment and recommendations to the referring MD via EMR. Note: Occasional unusual wording and randomly placed punctuation marks may result from the use of speech recognition technology to transcribe this document"  Chief Complaint: dysphagia  History of Present Illness: [name] is a 69 y.o. female with PMH GERD, anxiety, HLD, OA, who presents for evaluation of dysphagia.  Patient reports that at least for the last 5 years she has presented recurrent episodes of dysphagia when swallowing solids. She reports that the "food is taking longer to go down". She reports that she has regurgitation of the food after having a meal. She does have symptoms through the day. She has modified her diet to eat three small meals per day. She has recently felt nausea for the last 6 month, states it happens off and on, but does not vomit as she takes some ginger to improve her symptoms. She also endorses having early satiety for less than a year. Has only noticed loss of a couple of pounds since her symptoms started. She was having heartburn on a daily basis in the past for a long period of time. She reports that onions or spicy food triggers her symptoms. States that symptoms are better controlled after she was started on pantoprazole 20 mg every day. Has not presented any more heartburn episodes since then. The patient denies having any fever, chills, hematochezia, melena, hematemesis, abdominal distention, abdominal pain, diarrhea, jaundice, pruritus.  Last EGD: never Last Colonoscopy: 2009 - normal per chart report  FHx: neg for any gastrointestinal/liver disease, grandmother breast cancer Social:  neg smoking. Drinks 2 glasses of wine daily. Smokes marihuana occasionally Surgical Hx: partuial hysterectomy , salpingoophorectomy for teratoma, tubal ligation  Past Medical History: Past Medical History:  Diagnosis Date  . Allergy   . Anxiety   . GERD (gastroesophageal reflux disease)   . Hyperlipidemia   . Osteoarthritis     Past Surgical History: Past Surgical History:  Procedure Laterality Date  . cyst removed from right breast    . LAPAROSCOPIC SALPINGO OOPHERECTOMY Right 04/09/2017   Procedure: LAPAROSCOPIC RIGHT SALPINGO OOPHORECTOMY; RIGHT OVARIAN BENIGN CYSTIC TERATOMA;  Surgeon: Florian Buff, MD;  Location: [facility];  Service: Gynecology;  Laterality: Right;  . PARTIAL HYSTERECTOMY    . TUBAL LIGATION      Family History: Family History  Problem Relation Age of Onset  . Heart disease Mother   . Heart disease Father   . Diabetes Father   . Hypertension Sister   . Parkinson's disease Brother   . Heart disease Maternal Grandmother   . Cancer Paternal Grandmother        breast  . Diabetes Paternal Grandfather   . Hypertension Sister   . Hypertension Sister   . Down syndrome Son   . Eczema Son   . Post-traumatic stress disorder Daughter     Social History: Social History   Tobacco Use  Smoking Status Never Smoker  Smokeless Tobacco Never Used   Social History   Substance and Sexual Activity  Alcohol Use Yes   Comment: occasionally wine   Social History   Substance and Sexual Activity  Drug Use No    Allergies: Allergies  Allergen Reactions  . Ibuprofen  Palpitations and Other (See Comments)    Rapid heart rate.    Medications: Current Outpatient Medications  Medication Sig Dispense Refill  . amLODipine (NORVASC) 5 MG tablet TAKE ONE TABLET BY MOUTH ONCE DAILY. 30 tablet 4  . azelastine (ASTELIN) 0.1 % nasal spray Place 2 sprays into both nostrils 2 (two) times daily. Use in each nostril as directed 30 mL 5  . citalopram (CELEXA) 40 MG  tablet TAKE ONE TABLET BY MOUTH ONCE DAILY. 30 tablet 0  . fluticasone (FLONASE) 50 MCG/ACT nasal spray Place 2 sprays into both nostrils daily. Can do in the morning, or do one spray in more and one at night. 16 g 2  . gabapentin (NEURONTIN) 300 MG capsule Take 1 capsule (300 mg total) by mouth 2 (two) times daily. 60 capsule 4  . loratadine (CLARITIN) 10 MG tablet TAKE (1) TABLET BY MOUTH ONCE DAILY AS NEEDED FOR ALLERGIES. 90 tablet 0  . Omega-3 Fatty Acids (FISH OIL CONCENTRATE) 300 MG CAPS Take 600 mg by mouth daily.    Marland Kitchen oxybutynin (DITROPAN) 5 MG tablet TAKE (1) TABLET BY MOUTH TWICE DAILY. 60 tablet 5  . pantoprazole (PROTONIX) 20 MG tablet Take 1 tablet (20 mg total) by mouth daily. 30 tablet 3  . rosuvastatin (CRESTOR) 40 MG tablet Take 1 tablet (40 mg total) by mouth daily. 90 tablet 1  . tiZANidine (ZANAFLEX) 4 MG tablet Take 1 tablet (4 mg total) by mouth every 6 (six) hours as needed for muscle spasms. 30 tablet 0  . vitamin C (ASCORBIC ACID) 500 MG tablet Take 500 mg by mouth daily.    . vitamin E 1000 UNIT capsule Take 1,000 Units by mouth daily.     No current facility-administered medications for this visit.    Review of Systems: GENERAL: negative for malaise, night sweats HEENT: No changes in hearing or vision, no nose bleeds or other nasal problems. NECK: Negative for lumps, goiter, pain and significant neck swelling RESPIRATORY: Negative for cough, wheezing CARDIOVASCULAR: Negative for chest pain, leg swelling, palpitations, orthopnea GI: SEE HPI MUSCULOSKELETAL: Negative for joint pain or swelling, back pain, and muscle pain. SKIN: Negative for lesions, rash PSYCH: Negative for sleep disturbance, mood disorder and recent psychosocial stressors. HEMATOLOGY Negative for prolonged bleeding, bruising easily, and swollen nodes. ENDOCRINE: Negative for cold or heat intolerance, polyuria, polydipsia and goiter. NEURO: negative for tremor, gait imbalance, syncope and  seizures. The remainder of the review of systems is noncontributory.   Physical Exam: BP 121/74 (BP Location: Left Arm, Patient Position: Sitting, Cuff Size: Large)   Pulse 86   Temp 98 F (36.7 C) (Oral)   Ht 5\' 2"  (1.575 m)   Wt 164 lb (74.4 kg)   BMI 30.00 kg/m  GENERAL: The patient is AO x3, in no acute distress. HEENT: Head is normocephalic and atraumatic. EOMI are intact. Mouth is well hydrated and without lesions. NECK: Supple. No masses LUNGS: Clear to auscultation. No presence of rhonchi/wheezing/rales. Adequate chest expansion HEART: RRR, normal s1 and s2. ABDOMEN: Soft, nontender, no guarding, no peritoneal signs, and nondistended. BS +. No masses. EXTREMITIES: Without any cyanosis, clubbing, rash, lesions or edema. NEUROLOGIC: AOx3, no focal motor deficit. SKIN: no jaundice, no rashes   Imaging/Labs: as above  I personally reviewed and interpreted the available labs, imaging and endoscopic files.  Impression and Plan: [name] is a 69 y.o. female with PMH GERD, anxiety, HLD, OA, who presents for evaluation of dysphagia. Patient endorses having recurrent episodes  of dysphagia with presence of heartburn, which has improved with the intake of omeprazole. However, she has presented early satiety which is concerning given her age. Due to this, will proceed with an EGD with possible ED based on findings.  If there is no clear examination for her early satiety, we will consider performing a gastric emptying study.  She should continue taking the pantoprazole 20 mg every day as it has helped her with her symptoms.  Finally, she is due for colorectal cancer screening, for which I will order a colonoscopy.  - Schedule EGD and colonoscopy -Continue pantoprazole 20 mg qday -Will consider GES based on endoscopic findings  All questions were answered.      [name], MD Gastroenterology and Hepatology [facility]

## 2020-10-19 ENCOUNTER — Other Ambulatory Visit: Payer: Self-pay | Admitting: Family Medicine

## 2020-10-19 ENCOUNTER — Other Ambulatory Visit: Payer: Medicare Other

## 2020-10-19 DIAGNOSIS — F4323 Adjustment disorder with mixed anxiety and depressed mood: Secondary | ICD-10-CM

## 2020-10-19 DIAGNOSIS — Z20822 Contact with and (suspected) exposure to covid-19: Secondary | ICD-10-CM

## 2020-10-21 LAB — SARS-COV-2, NAA 2 DAY TAT

## 2020-10-21 LAB — NOVEL CORONAVIRUS, NAA: SARS-CoV-2, NAA: NOT DETECTED

## 2020-10-26 ENCOUNTER — Other Ambulatory Visit: Payer: Self-pay | Admitting: Family Medicine

## 2020-11-10 ENCOUNTER — Encounter (HOSPITAL_COMMUNITY): Payer: Self-pay | Admitting: Gastroenterology

## 2020-11-13 ENCOUNTER — Encounter (HOSPITAL_COMMUNITY): Payer: Self-pay | Admitting: Anesthesiology

## 2020-11-13 ENCOUNTER — Other Ambulatory Visit (HOSPITAL_COMMUNITY)
Admission: RE | Admit: 2020-11-13 | Discharge: 2020-11-13 | Disposition: A | Discharge status: Home / Self Care | Payer: Medicare Other | Source: Ambulatory Visit | Attending: Gastroenterology | Admitting: Gastroenterology

## 2020-11-14 ENCOUNTER — Encounter (HOSPITAL_COMMUNITY): Admission: RE | Payer: Self-pay | Source: Home / Self Care

## 2020-11-14 ENCOUNTER — Ambulatory Visit (HOSPITAL_COMMUNITY): Admission: RE | Admit: 2020-11-14 | Payer: Medicare Other | Source: Home / Self Care | Admitting: Gastroenterology

## 2020-11-14 ENCOUNTER — Other Ambulatory Visit: Payer: Self-pay | Admitting: Family Medicine

## 2020-11-14 SURGERY — COLONOSCOPY WITH PROPOFOL
Anesthesia: Monitor Anesthesia Care

## 2020-11-15 ENCOUNTER — Encounter: Payer: Medicare Other | Admitting: Family Medicine

## 2020-11-15 ENCOUNTER — Encounter (INDEPENDENT_AMBULATORY_CARE_PROVIDER_SITE_OTHER): Payer: Self-pay

## 2020-11-20 ENCOUNTER — Ambulatory Visit (HOSPITAL_COMMUNITY)
Admission: RE | Admit: 2020-11-20 | Discharge: 2020-11-20 | Disposition: A | Discharge status: Home / Self Care | Payer: Medicare Other | Source: Ambulatory Visit | Attending: Family Medicine | Admitting: Family Medicine

## 2020-11-20 DIAGNOSIS — Z1231 Encounter for screening mammogram for malignant neoplasm of breast: Secondary | ICD-10-CM | POA: Insufficient documentation

## 2020-11-22 ENCOUNTER — Other Ambulatory Visit: Payer: Self-pay | Admitting: Family Medicine

## 2020-11-23 ENCOUNTER — Other Ambulatory Visit: Payer: Self-pay | Admitting: Family Medicine

## 2020-11-23 DIAGNOSIS — F4323 Adjustment disorder with mixed anxiety and depressed mood: Secondary | ICD-10-CM

## 2020-12-04 ENCOUNTER — Other Ambulatory Visit (HOSPITAL_COMMUNITY): Payer: Medicare Other

## 2020-12-05 ENCOUNTER — Other Ambulatory Visit: Payer: Self-pay | Admitting: Family Medicine

## 2020-12-06 ENCOUNTER — Encounter (HOSPITAL_COMMUNITY): Admission: RE | Payer: Self-pay | Source: Home / Self Care

## 2020-12-06 ENCOUNTER — Ambulatory Visit (HOSPITAL_COMMUNITY): Admission: RE | Admit: 2020-12-06 | Payer: Medicare Other | Source: Home / Self Care | Admitting: Gastroenterology

## 2020-12-06 SURGERY — COLONOSCOPY WITH PROPOFOL
Anesthesia: Monitor Anesthesia Care

## 2020-12-26 ENCOUNTER — Other Ambulatory Visit: Payer: Self-pay | Admitting: Family Medicine

## 2020-12-26 ENCOUNTER — Encounter: Payer: Medicare Other | Admitting: Family Medicine

## 2020-12-27 ENCOUNTER — Other Ambulatory Visit: Payer: Self-pay | Admitting: Family Medicine

## 2020-12-27 DIAGNOSIS — F4323 Adjustment disorder with mixed anxiety and depressed mood: Secondary | ICD-10-CM

## 2020-12-28 ENCOUNTER — Encounter: Payer: Self-pay | Admitting: Family Medicine

## 2020-12-28 ENCOUNTER — Other Ambulatory Visit: Payer: Self-pay

## 2020-12-28 ENCOUNTER — Ambulatory Visit (INDEPENDENT_AMBULATORY_CARE_PROVIDER_SITE_OTHER): Payer: Medicare Other | Admitting: Family Medicine

## 2020-12-28 VITALS — BP 120/72 | HR 83 | Resp 15 | Ht 62.0 in | Wt 163.1 lb

## 2020-12-28 DIAGNOSIS — Z0000 Encounter for general adult medical examination without abnormal findings: Secondary | ICD-10-CM

## 2020-12-28 DIAGNOSIS — R7303 Prediabetes: Secondary | ICD-10-CM

## 2020-12-28 DIAGNOSIS — E785 Hyperlipidemia, unspecified: Secondary | ICD-10-CM

## 2020-12-28 DIAGNOSIS — E669 Obesity, unspecified: Secondary | ICD-10-CM

## 2020-12-28 DIAGNOSIS — R7301 Impaired fasting glucose: Secondary | ICD-10-CM

## 2020-12-28 NOTE — Patient Instructions (Signed)
F/u in 5 months, call if you need me sooner  Please get fasting lipid, cmp and eGFR and hBA1C in the next 1 week  colonoscopy , needs to be rescheduled, we will assist with this process, Nurse will direct you  It is important that you exercise regularly at least 30 minutes 5 times a week. If you develop chest pain, have severe difficulty breathing, or feel very tired, stop exercising immediately and seek medical attention  Think about what you will eat, plan ahead. Choose " clean, green, fresh or frozen" over canned, processed or packaged foods which are more sugary, salty and fatty. 70 to 75% of food eaten should be vegetables and fruit. Three meals at set times with snacks allowed between meals, but they must be fruit or vegetables. Aim to eat over a 12 hour period , example 7 am to 7 pm, and STOP after  your last meal of the day. Drink water,generally about 64 ounces per day, no other drink is as healthy. Fruit juice is best enjoyed in a healthy way, by EATING the fruit. Thanks for choosing [facility], we consider it a privelige to serve you.

## 2020-12-28 NOTE — Progress Notes (Signed)
    [name]     MRN: [id number]      DOB: 1951/05/14  HPI: Patient is in for annual physical exam. No other health concerns are expressed or addressed at the visit. Recent labs,  are reviewed. Immunization is reviewed , and  updated if needed.   PE: Pulse 83   Resp 15   Ht 5\' 2"  (1.575 m)   Wt 163 lb 1.9 oz (74 kg)   SpO2 99%   BMI 29.84 kg/m   Pleasant  female, alert and oriented x 3, in no cardio-pulmonary distress. Afebrile. HEENT No facial trauma or asymetry. Sinuses non tender.  Extra occullar muscles intact.. External ears normal, . Neck: supple, no adenopathy,JVD or thyromegaly.No bruits.  Chest: Clear to ascultation bilaterally.No crackles or wheezes. Non tender to palpation  Breast: Not examined, asymptomatic, normal mammogram in 10/2020  Cardiovascular system; Heart sounds normal,  S1 and  S2 ,no S3.  No murmur, or thrill. Apical beat not displaced Peripheral pulses normal.  Abdomen: Soft, non tender, no organomegaly or masses. No bruits. Bowel sounds normal. No guarding, tenderness or rebound.   GU: C/o vaginal dryness, no exam performed  Musculoskeletal exam: Full ROM of spine, hips , shoulders and knees. No deformity ,swelling or crepitus noted. No muscle wasting or atrophy.   Neurologic: Cranial nerves 2 to 12 intact. Power, tone ,sensation and reflexes normal throughout. No disturbance in gait. No tremor.  Skin: Intact, no ulceration, erythema , scaling or rash noted. Pigmentation normal throughout  Psych; Normal mood and affect. Judgement and concentration normal   Assessment & Plan:  Encounter for annual physical exam Annual exam as documented. Counseling done  re healthy lifestyle involving commitment to 150 minutes exercise per week, heart healthy diet, and attaining healthy weight.The importance of adequate sleep also discussed. Regular seat belt use and home safety, is also discussed. Changes in health habits are decided  on by the patient with goals and time frames  set for achieving them. Immunization and cancer screening needs are specifically addressed at this visit.

## 2020-12-29 ENCOUNTER — Encounter: Payer: Self-pay | Admitting: Family Medicine

## 2020-12-29 NOTE — Assessment & Plan Note (Signed)

## 2021-01-18 ENCOUNTER — Ambulatory Visit (INDEPENDENT_AMBULATORY_CARE_PROVIDER_SITE_OTHER): Payer: Medicare Other | Admitting: Gastroenterology

## 2021-01-19 ENCOUNTER — Other Ambulatory Visit: Payer: Self-pay | Admitting: Family Medicine

## 2021-01-26 ENCOUNTER — Other Ambulatory Visit: Payer: Self-pay | Admitting: Family Medicine

## 2021-02-05 ENCOUNTER — Other Ambulatory Visit: Payer: Self-pay | Admitting: Family Medicine

## 2021-02-05 DIAGNOSIS — F4323 Adjustment disorder with mixed anxiety and depressed mood: Secondary | ICD-10-CM

## 2021-02-20 ENCOUNTER — Other Ambulatory Visit: Payer: Self-pay | Admitting: Family Medicine

## 2021-02-25 ENCOUNTER — Emergency Department (HOSPITAL_COMMUNITY): Payer: Medicare Other

## 2021-02-25 ENCOUNTER — Encounter (HOSPITAL_COMMUNITY): Payer: Self-pay | Admitting: *Deleted

## 2021-02-25 ENCOUNTER — Emergency Department (HOSPITAL_COMMUNITY)
Admission: EM | Admit: 2021-02-25 | Discharge: 2021-02-25 | Disposition: A | Discharge status: Home / Self Care | Payer: Medicare Other | Attending: Emergency Medicine | Admitting: Emergency Medicine

## 2021-02-25 ENCOUNTER — Other Ambulatory Visit: Payer: Self-pay

## 2021-02-25 DIAGNOSIS — R0602 Shortness of breath: Secondary | ICD-10-CM | POA: Diagnosis not present

## 2021-02-25 DIAGNOSIS — R059 Cough, unspecified: Secondary | ICD-10-CM | POA: Diagnosis not present

## 2021-02-25 DIAGNOSIS — I10 Essential (primary) hypertension: Secondary | ICD-10-CM | POA: Insufficient documentation

## 2021-02-25 DIAGNOSIS — Z79899 Other long term (current) drug therapy: Secondary | ICD-10-CM | POA: Insufficient documentation

## 2021-02-25 DIAGNOSIS — Z20822 Contact with and (suspected) exposure to covid-19: Secondary | ICD-10-CM | POA: Diagnosis not present

## 2021-02-25 DIAGNOSIS — R0981 Nasal congestion: Secondary | ICD-10-CM | POA: Insufficient documentation

## 2021-02-25 LAB — BASIC METABOLIC PANEL
Anion gap: 5 (ref 5–15)
BUN: 11 mg/dL (ref 8–23)
CO2: 27 mmol/L (ref 22–32)
Calcium: 9.5 mg/dL (ref 8.9–10.3)
Chloride: 104 mmol/L (ref 98–111)
Creatinine, Ser: 0.81 mg/dL (ref 0.44–1.00)
GFR, Estimated: >60 mL/min (ref >60)
Glucose, Bld: 94 mg/dL (ref 70–99)
Potassium: 3.4 mmol/L — ABNORMAL LOW (ref 3.5–5.1)
Sodium: 136 mmol/L (ref 135–145)

## 2021-02-25 LAB — CBC WITH DIFFERENTIAL/PLATELET
Abs Immature Granulocytes: 0 10*3/uL (ref 0.00–0.07)
Basophils Absolute: 0 10*3/uL (ref 0.0–0.1)
Basophils Relative: 0 %
Eosinophils Absolute: 0.5 10*3/uL (ref 0.0–0.5)
Eosinophils Relative: 7 %
HCT: 36.7 % (ref 36.0–46.0)
Hemoglobin: 12.1 g/dL (ref 12.0–15.0)
Immature Granulocytes: 0 %
Lymphocytes Relative: 58 %
Lymphs Abs: 4.2 10*3/uL — ABNORMAL HIGH (ref 0.7–4.0)
MCH: 30.8 pg (ref 26.0–34.0)
MCHC: 33 g/dL (ref 30.0–36.0)
MCV: 93.4 fL (ref 80.0–100.0)
Monocytes Absolute: 0.6 10*3/uL (ref 0.1–1.0)
Monocytes Relative: 9 %
Neutro Abs: 1.8 10*3/uL (ref 1.7–7.7)
Neutrophils Relative %: 26 %
Platelets: 290 10*3/uL (ref 150–400)
RBC: 3.93 MIL/uL (ref 3.87–5.11)
RDW: 14.4 % (ref 11.5–15.5)
WBC: 7.1 10*3/uL (ref 4.0–10.5)
nRBC: 0 % (ref 0.0–0.2)

## 2021-02-25 LAB — RESP PANEL BY RT-PCR (FLU A&B, COVID) ARPGX2
Influenza A by PCR: NEGATIVE
Influenza B by PCR: NEGATIVE
SARS Coronavirus 2 by RT PCR: NEGATIVE

## 2021-02-25 LAB — BRAIN NATRIURETIC PEPTIDE: B Natriuretic Peptide: 11 pg/mL (ref 0.0–100.0)

## 2021-02-25 LAB — D-DIMER, QUANTITATIVE: D-Dimer, Quant: 0.44 ug/mL-FEU (ref 0.00–0.50)

## 2021-02-25 MED ORDER — ALBUTEROL SULFATE HFA 108 (90 BASE) MCG/ACT IN AERS
2.0000 | INHALATION_SPRAY | Freq: Once | RESPIRATORY_TRACT | Status: AC
  Administered 2021-02-25: 2 via RESPIRATORY_TRACT
  Filled 2021-02-25: qty 6.7

## 2021-02-25 NOTE — ED Notes (Signed)
Pt in bed, pt states that she is feeling better and wants to go home. D/c pt iv, cath intact, pt resps even and unlabored, pt from dpt

## 2021-02-25 NOTE — ED Notes (Signed)
Pt ambulatory to er room number 34, pt states that her allergies are getting worse, states that she takes loratadine, resps even and unlabored, pt talking in full sentences,

## 2021-02-25 NOTE — ED Notes (Signed)
Pt was ambulated without assistance at this time  was 98% and 80 heart rate no distress noted [name]

## 2021-02-25 NOTE — Discharge Instructions (Addendum)
You were seen in the emergency department today with trouble breathing.  Your lab work here was all reassuring.  We sent you home with an inhaler to take as directed if's shortness of breath symptoms develop.  Please call your primary care doctor tomorrow to schedule a follow-up visit in the coming week.  Additional testing and/or referrals may be required if symptoms continue.   If you develop chest pain or sudden worsening shortness of breath he should be reevaluated in the emergency department immediately.

## 2021-02-25 NOTE — ED Triage Notes (Signed)
Pt with c/o SOB for past few days. +productive cough, denies any fevers.

## 2021-02-25 NOTE — ED Provider Notes (Signed)
[facility] EMERGENCY DEPARTMENT Provider Note   CSN: [id number] Arrival date & time: 02/25/21  1614     History Chief Complaint  Patient presents with  . Shortness of Breath    [name] is a 70 y.o. female.  70 year old female with history of allergies, hyperlipidemia, hypertension, prediabetes presents with complaint of shortness of breath.  Reports progressively worsening over the past few weeks with congestion.  No relief with Mucinex and OTC allergy medication.  Patient is a non-smoker, no history of asthma or COPD.  No other complaints or concerns.        Past Medical History:  Diagnosis Date  . Allergy   . Anxiety   . Back pain with radiation 07/13/2012  . GERD (gastroesophageal reflux disease)   . Hyperlipidemia   . Osteoarthritis     Patient Active Problem List   Diagnosis Date Noted  . Cervicalgia 09/25/2020  . Heartburn 08/16/2020  . Seasonal allergies 02/18/2020  . Depression, major, single episode, moderate ([place]) 10/29/2019  . Obesity (BMI 30.0-34.9) 10/29/2019  . Essential hypertension 01/01/2018  . Encounter for annual physical exam 03/06/2017  . Prediabetes 03/30/2015  . Hyperlipemia 06/22/2008  . GERD (gastroesophageal reflux disease) 05/24/2008  . Allergic rhinitis 01/08/2008  . OVERACTIVE BLADDER 04/10/2007  . GAD (generalized anxiety disorder) 08/22/2006  . OSTEOARTHRITIS 08/22/2006    Past Surgical History:  Procedure Laterality Date  . cyst removed from right breast    . LAPAROSCOPIC SALPINGO OOPHERECTOMY Right 04/09/2017   Procedure: LAPAROSCOPIC RIGHT SALPINGO OOPHORECTOMY; RIGHT OVARIAN BENIGN CYSTIC TERATOMA;  Surgeon: Florian Buff, MD;  Location: [facility];  Service: Gynecology;  Laterality: Right;  . PARTIAL HYSTERECTOMY    . TUBAL LIGATION       OB History    Gravida  3   Para  3   Term  3   Preterm      AB      Living  3     SAB      IAB      Ectopic      Multiple      Live Births  3           Family  History  Problem Relation Age of Onset  . Heart disease Mother   . Heart disease Father   . Diabetes Father   . Hypertension Sister   . Parkinson's disease Brother   . Heart disease Maternal Grandmother   . Cancer Paternal Grandmother        breast  . Diabetes Paternal Grandfather   . Hypertension Sister   . Hypertension Sister   . Down syndrome Son   . Eczema Son   . Post-traumatic stress disorder Daughter     Social History   Tobacco Use  . Smoking status: Never Smoker  . Smokeless tobacco: Never Used  Vaping Use  . Vaping Use: Never used  Substance Use Topics  . Alcohol use: Yes    Comment: occasionally wine  . Drug use: No    Home Medications Prior to Admission medications   Medication Sig Start Date End Date Taking? Authorizing Provider  amLODipine (NORVASC) 5 MG tablet TAKE ONE TABLET BY MOUTH ONCE DAILY. 01/29/21  Yes Fayrene Helper, MD  Ascorbic Acid (VITAMIN C PO) Take 2 tablets by mouth daily. Power C   Yes [provider]  citalopram (CELEXA) 40 MG tablet TAKE ONE TABLET BY MOUTH ONCE DAILY. 02/05/21  Yes Fayrene Helper, MD  ELDERBERRY PO Take 2 capsules by mouth daily.   Yes [provider]  fluticasone (FLONASE) 50 MCG/ACT nasal spray Place 2 sprays into both nostrils daily. Can do in the morning, or do one spray in more and one at night. Patient taking differently: Place 2 sprays into both nostrils daily. 02/18/20  Yes Perlie Mayo, NP  gabapentin (NEURONTIN) 300 MG capsule Take 1 capsule (300 mg total) by mouth 2 (two) times daily. 08/22/20  Yes Fayrene Helper, MD  loratadine (CLARITIN) 10 MG tablet TAKE (1) TABLET BY MOUTH ONCE DAILY AS NEEDED FOR ALLERGIES. Patient taking differently: Take 10 mg by mouth daily as needed for allergies. 12/05/20  Yes Fayrene Helper, MD  Omega-3 Fatty Acids (FISH OIL CONCENTRATE PO) Take 1,576 mg by mouth daily. 788 mg each   Yes [provider]  oxybutynin (DITROPAN) 5 MG tablet  TAKE (1) TABLET BY MOUTH TWICE DAILY. Patient taking differently: Take 5 mg by mouth 2 (two) times daily. 02/09/20  Yes Fayrene Helper, MD  pantoprazole (PROTONIX) 20 MG tablet TAKE 1 TABLET BY MOUTH ONCE DAILY. 02/20/21  Yes Fayrene Helper, MD  rosuvastatin (CRESTOR) 40 MG tablet TAKE 1 TABLET BY MOUTH ONCE DAILY. 11/22/20  Yes Fayrene Helper, MD  vitamin B-12 (CYANOCOBALAMIN) 1000 MCG tablet Take 1,000 mcg by mouth daily.   Yes [provider]  Vitamin E 400 units TABS Take 400 Units by mouth daily.   Yes [provider]    Allergies    Ibuprofen  Review of Systems   Review of Systems  Constitutional: Negative for chills and fever.  HENT: Positive for congestion.   Respiratory: Positive for cough and shortness of breath. Negative for wheezing.   Cardiovascular: Negative for chest pain.  Gastrointestinal: Negative for abdominal pain, nausea and vomiting.  Genitourinary: Negative for dysuria and frequency.  Musculoskeletal: Negative for back pain.  Skin: Negative for rash and wound.  Allergic/Immunologic: Negative for immunocompromised state.  Neurological: Negative for weakness.  Hematological: Negative for adenopathy.  Psychiatric/Behavioral: Negative for confusion.  All other systems reviewed and are negative.   Physical Exam Updated Vital Signs BP (!) 157/82 (BP Location: Left Arm)   Pulse 67   Temp 99.9 F (37.7 C) (Oral)   Resp 18   Ht 5\' 2"  (1.575 m)   Wt 70.3 kg   SpO2 100%   BMI 28.35 kg/m   Physical Exam Vitals and nursing note reviewed.  Constitutional:      General: She is not in acute distress.    Appearance: She is well-developed. She is not diaphoretic.  HENT:     Head: Normocephalic and atraumatic.  Cardiovascular:     Rate and Rhythm: Normal rate and regular rhythm.  Pulmonary:     Effort: Pulmonary effort is normal.     Breath sounds: Normal breath sounds.  Chest:     Chest wall: No tenderness.  Abdominal:      Palpations: Abdomen is soft.     Tenderness: There is no abdominal tenderness.  Musculoskeletal:     Cervical back: Neck supple.     Right lower leg: No tenderness. No edema.     Left lower leg: No tenderness. No edema.  Skin:    General: Skin is warm and dry.  Neurological:     Mental Status: She is alert and oriented to person, place, and time.  Psychiatric:        Behavior: Behavior normal.     ED  Results / Procedures / Treatments   Labs (all labs ordered are listed, but only abnormal results are displayed) Labs Reviewed  BASIC METABOLIC PANEL - Abnormal; Notable for the following components:      Result Value   Potassium 3.4 (*)    All other components within normal limits  CBC WITH DIFFERENTIAL/PLATELET - Abnormal; Notable for the following components:   Lymphs Abs 4.2 (*)    All other components within normal limits  RESP PANEL BY RT-PCR (FLU A&B, COVID) ARPGX2  BRAIN NATRIURETIC PEPTIDE  D-DIMER, QUANTITATIVE    EKG EKG Interpretation  Date/Time:  Sunday Feb 25 2021 17:26:00 EDT Ventricular Rate:  66 PR Interval:  191 QRS Duration: 91 QT Interval:  419 QTC Calculation: 439 R Axis:   33 Text Interpretation: Sinus rhythm Abnormal R-wave progression, early transition Confirmed by Nanda Quinton [phone]) on 02/25/2021 5:32:39 PM   Radiology DG Chest Portable 1 View  Result Date: 02/25/2021 CLINICAL DATA:  Shortness of breath, productive cough EXAM: PORTABLE CHEST 1 VIEW COMPARISON:  Chest x-ray dated 01/29/2012 FINDINGS: Heart size is upper normal. Lungs are clear. No pleural effusion or pneumothorax is seen. Osseous structures about the chest are unremarkable. IMPRESSION: No active disease. No evidence of pneumonia or pulmonary edema. Electronically Signed   By: Franki Cabot M.D.   On: 02/25/2021 16:44    Procedures Procedures   Medications Ordered in ED Medications  albuterol (VENTOLIN HFA) 108 (90 Base) MCG/ACT inhaler 2 puff (has no administration in time  range)    ED Course  I have reviewed the triage vital signs and the nursing notes.  Pertinent labs & imaging results that were available during my care of the patient were reviewed by me and considered in my medical decision making (see chart for details).  Clinical Course as of 02/25/21 1844  Sun Feb 26, 4636  [id number] 70 year old female with complaint of shortness of breath with congestion, progressively worsening and not improving with OTC allergy relief and Mucinex as above. Review of vitals, slightly elevated temp at 99.9 oral with O2 sat normal at room air.  Vitals otherwise unremarkable. Chest x-ray is unremarkable. Add on lab work and COVID test, CBC, BMP, BNP reassuring, COVID and flu negative. Discussed with Dr. Laverta Baltimore, ER attending, plan is for albuterol inhaler, ambulatory pulse ox and add on D-dimer.  Dr. Laverta Baltimore to follow with results at change of shift. Discussed results and plan of care with patient who verbalizes understanding. [LM]    Clinical Course User Index [LM] Roque Lias   MDM Rules/Calculators/A&P                          Final Clinical Impression(s) / ED Diagnoses Final diagnoses:  SOB (shortness of breath)    Rx / DC Orders ED Discharge Orders    None       Roque Lias 02/25/21 1844    Long, Wonda Olds, MD 02/25/21 1921

## 2021-02-26 ENCOUNTER — Other Ambulatory Visit: Payer: Self-pay | Admitting: Family Medicine

## 2021-03-12 ENCOUNTER — Other Ambulatory Visit: Payer: Self-pay | Admitting: Family Medicine

## 2021-03-21 ENCOUNTER — Other Ambulatory Visit: Payer: Self-pay | Admitting: Family Medicine

## 2021-03-26 ENCOUNTER — Other Ambulatory Visit: Payer: Self-pay | Admitting: Family Medicine

## 2021-03-26 DIAGNOSIS — F4323 Adjustment disorder with mixed anxiety and depressed mood: Secondary | ICD-10-CM

## 2021-04-23 ENCOUNTER — Other Ambulatory Visit: Payer: Self-pay | Admitting: Family Medicine

## 2021-04-24 ENCOUNTER — Other Ambulatory Visit: Payer: Self-pay | Admitting: Family Medicine

## 2021-04-26 ENCOUNTER — Ambulatory Visit (INDEPENDENT_AMBULATORY_CARE_PROVIDER_SITE_OTHER): Payer: Medicare Other | Admitting: Gastroenterology

## 2021-05-07 ENCOUNTER — Other Ambulatory Visit: Payer: Self-pay | Admitting: Family Medicine

## 2021-05-07 DIAGNOSIS — F4323 Adjustment disorder with mixed anxiety and depressed mood: Secondary | ICD-10-CM

## 2021-05-24 ENCOUNTER — Other Ambulatory Visit: Payer: Self-pay | Admitting: Family Medicine

## 2021-05-31 ENCOUNTER — Ambulatory Visit: Payer: Medicare Other | Admitting: Family Medicine

## 2021-06-14 ENCOUNTER — Ambulatory Visit: Payer: Medicare Other | Admitting: Family Medicine

## 2021-06-20 ENCOUNTER — Other Ambulatory Visit: Payer: Self-pay | Admitting: Family Medicine

## 2021-06-25 ENCOUNTER — Other Ambulatory Visit: Payer: Self-pay | Admitting: Family Medicine

## 2021-06-25 DIAGNOSIS — F4323 Adjustment disorder with mixed anxiety and depressed mood: Secondary | ICD-10-CM

## 2021-06-26 ENCOUNTER — Other Ambulatory Visit: Payer: Self-pay | Admitting: Family Medicine

## 2021-07-07 ENCOUNTER — Other Ambulatory Visit: Payer: Self-pay | Admitting: Family Medicine

## 2021-07-09 ENCOUNTER — Other Ambulatory Visit: Payer: Self-pay | Admitting: Family Medicine

## 2021-07-20 ENCOUNTER — Ambulatory Visit: Payer: Medicare Other | Admitting: Family Medicine

## 2021-07-23 ENCOUNTER — Other Ambulatory Visit: Payer: Self-pay | Admitting: Family Medicine

## 2021-07-23 DIAGNOSIS — H524 Presbyopia: Secondary | ICD-10-CM | POA: Diagnosis not present

## 2021-08-20 ENCOUNTER — Other Ambulatory Visit: Payer: Self-pay | Admitting: Family Medicine

## 2021-08-20 DIAGNOSIS — F4323 Adjustment disorder with mixed anxiety and depressed mood: Secondary | ICD-10-CM

## 2021-08-23 ENCOUNTER — Ambulatory Visit (INDEPENDENT_AMBULATORY_CARE_PROVIDER_SITE_OTHER): Payer: Medicare Other | Admitting: Gastroenterology

## 2021-08-30 ENCOUNTER — Other Ambulatory Visit: Payer: Self-pay | Admitting: Family Medicine

## 2021-09-07 ENCOUNTER — Other Ambulatory Visit: Payer: Self-pay | Admitting: Family Medicine

## 2021-09-11 ENCOUNTER — Other Ambulatory Visit: Payer: Self-pay | Admitting: Family Medicine

## 2021-09-25 ENCOUNTER — Other Ambulatory Visit: Payer: Self-pay | Admitting: Family Medicine

## 2021-10-02 ENCOUNTER — Other Ambulatory Visit: Payer: Self-pay

## 2021-10-02 ENCOUNTER — Encounter: Payer: Medicare Other | Admitting: Family Medicine

## 2021-10-02 NOTE — Progress Notes (Signed)
This encounter was created in error - please disregard.

## 2021-10-02 NOTE — Patient Instructions (Signed)

## 2021-10-05 ENCOUNTER — Other Ambulatory Visit: Payer: Self-pay | Admitting: Family Medicine

## 2021-10-08 ENCOUNTER — Other Ambulatory Visit: Payer: Self-pay | Admitting: Nurse Practitioner

## 2021-10-10 ENCOUNTER — Ambulatory Visit (INDEPENDENT_AMBULATORY_CARE_PROVIDER_SITE_OTHER): Payer: Medicare Other | Admitting: Nurse Practitioner

## 2021-10-10 ENCOUNTER — Other Ambulatory Visit: Payer: Self-pay

## 2021-10-10 ENCOUNTER — Encounter: Payer: Self-pay | Admitting: Nurse Practitioner

## 2021-10-10 ENCOUNTER — Ambulatory Visit: Payer: Medicare Other

## 2021-10-10 DIAGNOSIS — J069 Acute upper respiratory infection, unspecified: Secondary | ICD-10-CM | POA: Diagnosis not present

## 2021-10-10 LAB — POCT INFLUENZA A/B
Influenza A, POC: NEGATIVE
Influenza B, POC: NEGATIVE

## 2021-10-10 MED ORDER — CORICIDIN HBP COUGH/COLD 4-30 MG PO TABS
1.0000 | ORAL_TABLET | Freq: Four times a day (QID) | ORAL | 0 refills | Status: DC | PRN
Start: 2021-10-10 — End: 2021-11-20

## 2021-10-10 NOTE — Assessment & Plan Note (Addendum)
-  acute; symptoms started 2 days ago -Rx. Coricidin HBP -will get flu and COVID test -will consider abx if symptoms last over the weekend

## 2021-10-10 NOTE — Progress Notes (Signed)
Acute Office Visit  Subjective:    Patient ID: [name], female    DOB: Mar 30, 1951, 70 y.o.   MRN: [id number]  Chief Complaint  Patient presents with   Sore Throat   Cough    Sore throat cough runny nose drainage making her nauseous ears hurt no fever     Sore Throat  Associated symptoms include congestion, coughing and ear pain. Pertinent negatives include no shortness of breath.  Cough Associated symptoms include ear pain, postnasal drip, rhinorrhea and a sore throat. Pertinent negatives include no chills, fever, shortness of breath or wheezing.  Patient is in today for sick visit. Symptoms started 10/08/21 with a scratchy throat. She has tried nyquil and loratadine.  Past Medical History:  Diagnosis Date   Allergy    Anxiety    Back pain with radiation 07/13/2012   GERD (gastroesophageal reflux disease)    Hyperlipidemia    Osteoarthritis     Past Surgical History:  Procedure Laterality Date   cyst removed from right breast     LAPAROSCOPIC SALPINGO OOPHERECTOMY Right 04/09/2017   Procedure: LAPAROSCOPIC RIGHT SALPINGO OOPHORECTOMY; RIGHT OVARIAN BENIGN CYSTIC TERATOMA;  Surgeon: Florian Buff, MD;  Location: [facility];  Service: Gynecology;  Laterality: Right;   PARTIAL HYSTERECTOMY     TUBAL LIGATION      Family History  Problem Relation Age of Onset   Heart disease Mother    Heart disease Father    Diabetes Father    Hypertension Sister    Parkinson's disease Brother    Heart disease Maternal Grandmother    Cancer Paternal Grandmother        breast   Diabetes Paternal Grandfather    Hypertension Sister    Hypertension Sister    Down syndrome Son    Eczema Son    Post-traumatic stress disorder Daughter     Social History   Socioeconomic History   Marital status: Single    Spouse name: Not on file   Number of children: 3   Years of education: 12   Highest education level: 12th grade  Occupational History   Not on file  Tobacco Use   Smoking  status: Never   Smokeless tobacco: Never  Vaping Use   Vaping Use: Never used  Substance and Sexual Activity   Alcohol use: Yes    Comment: occasionally wine   Drug use: No   Sexual activity: Not Currently    Birth control/protection: Surgical    Comment: hyst  Other Topics Concern   Not on file  Social History Narrative   Not on file   Social Determinants of Health   Financial Resource Strain: Not on file  Food Insecurity: Not on file  Transportation Needs: Not on file  Physical Activity: Not on file  Stress: Not on file  Social Connections: Not on file  Intimate Partner Violence: Not on file    Outpatient Medications Prior to Visit  Medication Sig Dispense Refill   amLODipine (NORVASC) 5 MG tablet TAKE ONE TABLET BY MOUTH ONCE DAILY. 30 tablet 0   Ascorbic Acid (VITAMIN C PO) Take 2 tablets by mouth daily. Power C     citalopram (CELEXA) 40 MG tablet TAKE ONE TABLET BY MOUTH ONCE DAILY. 30 tablet 0   ELDERBERRY PO Take 2 capsules by mouth daily.     fluticasone (FLONASE) 50 MCG/ACT nasal spray Place 2 sprays into both nostrils daily. Can do in the morning, or do one spray in more  and one at night. (Patient taking differently: Place 2 sprays into both nostrils daily.) 16 g 2   gabapentin (NEURONTIN) 300 MG capsule TAKE (1) CAPSULE BY MOUTH TWICE DAILY. 60 capsule 0   loratadine (CLARITIN) 10 MG tablet TAKE (1) TABLET BY MOUTH ONCE DAILY AS NEEDED FOR ALLERGIES. 90 tablet 0   Omega-3 Fatty Acids (FISH OIL CONCENTRATE PO) Take 1,576 mg by mouth daily. 788 mg each     oxybutynin (DITROPAN) 5 MG tablet TAKE (1) TABLET BY MOUTH TWICE DAILY. 60 tablet 0   pantoprazole (PROTONIX) 20 MG tablet TAKE 1 TABLET BY MOUTH ONCE DAILY. 30 tablet 0   rosuvastatin (CRESTOR) 40 MG tablet TAKE 1 TABLET BY MOUTH ONCE DAILY. 90 tablet 0   vitamin B-12 (CYANOCOBALAMIN) 1000 MCG tablet Take 1,000 mcg by mouth daily.     Vitamin E 400 units TABS Take 400 Units by mouth daily.     No  facility-administered medications prior to visit.    Allergies  Allergen Reactions   Ibuprofen Palpitations and Other (See Comments)    Rapid heart rate.    Review of Systems  Constitutional:  Positive for fatigue. Negative for chills and fever.  HENT:  Positive for congestion, ear pain, postnasal drip, rhinorrhea, sinus pressure, sinus pain and sore throat.   Respiratory:  Positive for cough. Negative for shortness of breath and wheezing.       Objective:    Physical Exam  There were no vitals taken for this visit. Wt Readings from Last 3 Encounters:  02/25/21 155 lb (70.3 kg)  12/28/20 163 lb 1.9 oz (74 kg)  10/12/20 164 lb (74.4 kg)    Health Maintenance Due  Topic Date Due   Zoster Vaccines- Shingrix (1 of 2) Never done   TETANUS/TDAP  05/17/2018   COLONOSCOPY (Pts 45-25yrs Insurance coverage will need to be confirmed)  08/02/2018   COVID-19 Vaccine (3 - Booster for Moderna series) 11/30/2020   INFLUENZA VACCINE  05/28/2021    There are no preventive care reminders to display for this patient.   Lab Results  Component Value Date   TSH 1.12 08/16/2020   Lab Results  Component Value Date   WBC 7.1 02/25/2021   HGB 12.1 02/25/2021   HCT 36.7 02/25/2021   MCV 93.4 02/25/2021   PLT 290 02/25/2021   Lab Results  Component Value Date   NA 136 02/25/2021   K 3.4 (L) 02/25/2021   CO2 27 02/25/2021   GLUCOSE 94 02/25/2021   BUN 11 02/25/2021   CREATININE 0.81 02/25/2021   BILITOT 0.4 08/16/2020   ALKPHOS 41 04/04/2017   AST 26 08/16/2020   ALT 22 08/16/2020   PROT 7.7 08/16/2020   ALBUMIN 3.8 04/04/2017   CALCIUM 9.5 02/25/2021   ANIONGAP 5 02/25/2021   Lab Results  Component Value Date   CHOL 246 (H) 08/16/2020   Lab Results  Component Value Date   HDL 70 08/16/2020   Lab Results  Component Value Date   LDLCALC 151 (H) 08/16/2020   Lab Results  Component Value Date   TRIG 129 08/16/2020   Lab Results  Component Value Date   CHOLHDL 3.5  08/16/2020   Lab Results  Component Value Date   HGBA1C 5.8 (H) 08/16/2020       Assessment & Plan:   Problem List Items Addressed This Visit       Respiratory   URI (upper respiratory infection) - Primary    -acute; symptoms started 2 days  ago -Rx. Coricidin HBP -will get flu and COVID test      Relevant Medications   Chlorpheniramine-DM (CORICIDIN COUGH/COLD) 4-30 MG TABS   Other Relevant Orders   Novel Coronavirus, NAA ([facility])   POCT Influenza A/B     Meds ordered this encounter  Medications   Chlorpheniramine-DM (CORICIDIN COUGH/COLD) 4-30 MG TABS    Sig: Take 1 tablet by mouth every 6 (six) hours as needed.    Dispense:  28 tablet    Refill:  0   Date:  10/10/2021   Location of Patient: Home Location of Provider: Office Consent was obtain for visit to be over via telehealth. I verified that I am speaking with the correct person using two identifiers.  I connected with  [name] on 10/10/21 via telephone and verified that I am speaking with the correct person using two identifiers.   I discussed the limitations of evaluation and management by telemedicine. The patient expressed understanding and agreed to proceed.  Time spent: 6 min   Noreene Larsson, NP

## 2021-10-11 ENCOUNTER — Telehealth: Payer: Self-pay | Admitting: Family Medicine

## 2021-10-11 NOTE — Telephone Encounter (Signed)
Patient aware..... patient just started the coricidin will let me know how she is feeling tomorrow.

## 2021-10-11 NOTE — Telephone Encounter (Signed)
The coricidin has dextromethorphan which is a cough medicine in addition to the decongestant. Is it helping with her cough?

## 2021-10-11 NOTE — Telephone Encounter (Signed)
Pt called in regard to swab results from 12/14  Pt also needs cough med filled from tele visit 12/14

## 2021-10-12 ENCOUNTER — Telehealth: Payer: Self-pay | Admitting: Family Medicine

## 2021-10-12 LAB — SARS-COV-2, NAA 2 DAY TAT

## 2021-10-12 LAB — NOVEL CORONAVIRUS, NAA: SARS-CoV-2, NAA: NOT DETECTED

## 2021-10-12 NOTE — Telephone Encounter (Signed)
Pt called to return phone call

## 2021-10-12 NOTE — Progress Notes (Signed)
Flu and covid negative

## 2021-10-12 NOTE — Telephone Encounter (Signed)
Pt aware of results 

## 2021-10-15 ENCOUNTER — Other Ambulatory Visit: Payer: Self-pay | Admitting: Nurse Practitioner

## 2021-10-15 ENCOUNTER — Telehealth: Payer: Self-pay | Admitting: Family Medicine

## 2021-10-15 DIAGNOSIS — J069 Acute upper respiratory infection, unspecified: Secondary | ICD-10-CM

## 2021-10-15 MED ORDER — AZITHROMYCIN 250 MG PO TABS: ORAL_TABLET | ORAL | 0 refills | Status: DC

## 2021-10-15 NOTE — Assessment & Plan Note (Signed)
-  she is still symptomatic and it has been over a week -Rx. z-pack

## 2021-10-15 NOTE — Telephone Encounter (Signed)
I sent in a z-pack for her. I hope she feels better.

## 2021-10-15 NOTE — Telephone Encounter (Signed)
Patient aware.

## 2021-10-15 NOTE — Telephone Encounter (Signed)
Pt called in regsrd to   Chlorpheniramine-DM (CORICIDIN COUGH/COLD) 4-30 MG TABS  Prescribed on 12/14  Pt states that med is not helping with cough seems to be making cough more  Wants to discuss

## 2021-10-16 ENCOUNTER — Other Ambulatory Visit: Payer: Self-pay | Admitting: Family Medicine

## 2021-10-16 DIAGNOSIS — F4323 Adjustment disorder with mixed anxiety and depressed mood: Secondary | ICD-10-CM

## 2021-10-24 ENCOUNTER — Other Ambulatory Visit: Payer: Self-pay | Admitting: Family Medicine

## 2021-11-08 ENCOUNTER — Other Ambulatory Visit: Payer: Self-pay | Admitting: Family Medicine

## 2021-11-19 ENCOUNTER — Other Ambulatory Visit: Payer: Self-pay | Admitting: Family Medicine

## 2021-11-20 ENCOUNTER — Other Ambulatory Visit: Payer: Self-pay

## 2021-11-20 ENCOUNTER — Other Ambulatory Visit: Payer: Self-pay | Admitting: Family Medicine

## 2021-11-20 ENCOUNTER — Ambulatory Visit (INDEPENDENT_AMBULATORY_CARE_PROVIDER_SITE_OTHER): Payer: Medicare Other | Admitting: Family Medicine

## 2021-11-20 ENCOUNTER — Encounter: Payer: Self-pay | Admitting: Family Medicine

## 2021-11-20 VITALS — BP 128/72 | HR 78 | Ht 62.0 in | Wt 163.0 lb

## 2021-11-20 DIAGNOSIS — R7303 Prediabetes: Secondary | ICD-10-CM

## 2021-11-20 DIAGNOSIS — F411 Generalized anxiety disorder: Secondary | ICD-10-CM

## 2021-11-20 DIAGNOSIS — I10 Essential (primary) hypertension: Secondary | ICD-10-CM

## 2021-11-20 DIAGNOSIS — E559 Vitamin D deficiency, unspecified: Secondary | ICD-10-CM | POA: Diagnosis not present

## 2021-11-20 DIAGNOSIS — K219 Gastro-esophageal reflux disease without esophagitis: Secondary | ICD-10-CM

## 2021-11-20 DIAGNOSIS — N318 Other neuromuscular dysfunction of bladder: Secondary | ICD-10-CM

## 2021-11-20 DIAGNOSIS — Z1231 Encounter for screening mammogram for malignant neoplasm of breast: Secondary | ICD-10-CM

## 2021-11-20 DIAGNOSIS — E785 Hyperlipidemia, unspecified: Secondary | ICD-10-CM

## 2021-11-20 DIAGNOSIS — F321 Major depressive disorder, single episode, moderate: Secondary | ICD-10-CM

## 2021-11-20 DIAGNOSIS — Z1211 Encounter for screening for malignant neoplasm of colon: Secondary | ICD-10-CM

## 2021-11-20 DIAGNOSIS — J302 Other seasonal allergic rhinitis: Secondary | ICD-10-CM

## 2021-11-20 MED ORDER — CLOTRIMAZOLE-BETAMETHASONE 1-0.05 % EX CREA: 1.0000 "application " | TOPICAL_CREAM | Freq: Two times a day (BID) | CUTANEOUS | 1 refills | Status: DC

## 2021-11-20 MED ORDER — GABAPENTIN 300 MG PO CAPS: 300.0000 mg | ORAL_CAPSULE | Freq: Every day | ORAL | 3 refills | Status: DC

## 2021-11-20 NOTE — Progress Notes (Signed)
[name]     MRN: [id number]      DOB: 1951-10-01   HPI [name] is here for follow up and re-evaluation of chronic medical conditions, medication management and review of any available recent lab and radiology data.  Preventive health is updated, specifically  Cancer screening and Immunization.    The PT denies any adverse reactions to current medications since the last visit.  C/o itching on lower back  ROS Denies recent fever or chills. Denies sinus pressure, nasal congestion, ear pain or sore throat. Denies chest congestion, productive cough or wheezing. Denies chest pains, palpitations and leg swelling Denies abdominal pain, nausea, vomiting,diarrhea or constipation.   Denies dysuria, frequency, hesitancy or incontinence. Denies joint pain, swelling and limitation in mobility. Denies headaches, seizures, numbness, or tingling. Denies  uncontrolled depression, anxiety or insomnia.  PE  BP 128/72    Pulse 78    Ht 5\' 2"  (1.575 m)    Wt 163 lb (73.9 kg)    SpO2 98%    BMI 29.81 kg/m    Patient alert and oriented and in no cardiopulmonary distress.  HEENT: No facial asymmetry, EOMI,     Neck supple .  Chest: Clear to auscultation bilaterally.  CVS: S1, S2 no murmurs, no S3.Regular rate.  ABD: Soft non tender.   Ext: No edema  MS: Adequate ROM spine, shoulders, hips and knees.  Skin: Intact, fungal  rash noted.on lower back  Psych: Good eye contact, normal affect. Memory intact not anxious or depressed appearing.  CNS: CN 2-12 intact, power,  normal throughout.no focal deficits noted.   Assessment & Plan  Essential hypertension DASH diet and commitment to daily physical activity for a minimum of 30 minutes discussed and encouraged, as a part of hypertension management. The importance of attaining a healthy weight is also discussed.  BP/Weight 11/20/2021 02/25/2021 12/28/2020 10/12/2020 09/28/2020 09/25/2020 09/32/3557  Systolic BP 322 025 427 062 128 376 283   Diastolic BP 72 75 72 74 82 73 82  Wt. (Lbs) 163 155 163.12 164 165 - 165  BMI 29.81 28.35 29.84 30 30.18 - 30.18     Controlled, no change in medication   Hyperlipemia Hyperlipidemia:Low fat diet discussed and encouraged.   Lipid Panel  Lab Results  Component Value Date   CHOL 179 11/20/2021   HDL 76 11/20/2021   LDLCALC 88 11/20/2021   TRIG 85 11/20/2021   CHOLHDL 2.4 11/20/2021     Controlled, no change in medication   GERD (gastroesophageal reflux disease) Controlled, no change in medication   GAD (generalized anxiety disorder) Controlled, no change in medication   Depression, major, single episode, moderate (HCC) Controlled, no change in medication   OVERACTIVE BLADDER Controlled, no change in medication   Prediabetes Deteriorated Patient educated about the importance of limiting  Carbohydrate intake , the need to commit to daily physical activity for a minimum of 30 minutes , and to commit weight loss. The fact that changes in all these areas will reduce or eliminate all together the development of diabetes is stressed.   Diabetic Labs Latest Ref Rng & Units 11/20/2021 02/25/2021 08/16/2020 04/04/2017 03/19/2017  HbA1c 4.8 - 5.6 % 6.1(H) - 5.8(H) - -  Chol 100 - 199 mg/dL 179 - 246(H) - -  HDL >39 mg/dL 76 - 70 - -  Calc LDL 0 - 99 mg/dL 88 - 151(H) - -  Triglycerides 0 - 149 mg/dL 85 - 129 - -  Creatinine 0.57 -  1.00 mg/dL 0.87 0.81 0.91 0.90 0.76   BP/Weight 11/20/2021 02/25/2021 12/28/2020 10/12/2020 09/28/2020 09/25/2020 92/42/6834  Systolic BP 196 222 979 892 119 417 408  Diastolic BP 72 75 72 74 82 73 82  Wt. (Lbs) 163 155 163.12 164 165 - 165  BMI 29.81 28.35 29.84 30 30.18 - 30.18   No flowsheet data found.    Seasonal allergies Controlled, no change in medication

## 2021-11-20 NOTE — Patient Instructions (Signed)
Annual exam and April call if you need me sooner.  Labs today CBC lipid CMP and EGFR hemoglobin A1c TSH and vitamin D.  Please get shingrix vaccines at your pharmacy  Please schedule mammogram at checkout.  You are referred for colonoscopy important that you go through with this.  Use Flonase every day and take Claritin every day for allergies.  Cream sent in for itchy rash on lower back.  Gabapentin dose is reduced to 1 at bedtime.  Great that you are committed to regular aerobic exercise this is improving your health greatly and your blood pressure is excellent.  Thanks for choosing [facility], we consider it a privelige to serve you.

## 2021-11-21 LAB — CBC
Hematocrit: 39.2 % (ref 34.0–46.6)
Hemoglobin: 13 g/dL (ref 11.1–15.9)
MCH: 30.2 pg (ref 26.6–33.0)
MCHC: 33.2 g/dL (ref 31.5–35.7)
MCV: 91 fL (ref 79–97)
Platelets: 276 10*3/uL (ref 150–450)
RBC: 4.31 x10E6/uL (ref 3.77–5.28)
RDW: 13.2 % (ref 11.7–15.4)
WBC: 7.4 10*3/uL (ref 3.4–10.8)

## 2021-11-21 LAB — CMP14+EGFR
ALT: 40 IU/L — ABNORMAL HIGH (ref 0–32)
AST: 36 IU/L (ref 0–40)
Albumin/Globulin Ratio: 1.6 (ref 1.2–2.2)
Albumin: 4.7 g/dL (ref 3.8–4.8)
Alkaline Phosphatase: 60 IU/L (ref 44–121)
BUN/Creatinine Ratio: 11 — ABNORMAL LOW (ref 12–28)
BUN: 10 mg/dL (ref 8–27)
Bilirubin Total: 0.3 mg/dL (ref 0.0–1.2)
CO2: 27 mmol/L (ref 20–29)
Calcium: 9.8 mg/dL (ref 8.7–10.3)
Chloride: 103 mmol/L (ref 96–106)
Creatinine, Ser: 0.87 mg/dL (ref 0.57–1.00)
Globulin, Total: 2.9 g/dL (ref 1.5–4.5)
Glucose: 108 mg/dL — ABNORMAL HIGH (ref 70–99)
Potassium: 4.3 mmol/L (ref 3.5–5.2)
Sodium: 141 mmol/L (ref 134–144)
Total Protein: 7.6 g/dL (ref 6.0–8.5)
eGFR: 72 mL/min/{1.73_m2} (ref >59)

## 2021-11-21 LAB — VITAMIN D 25 HYDROXY (VIT D DEFICIENCY, FRACTURES): Vit D, 25-Hydroxy: 68.1 ng/mL (ref 30.0–100.0)

## 2021-11-21 LAB — LIPID PANEL
Chol/HDL Ratio: 2.4 ratio (ref 0.0–4.4)
Cholesterol, Total: 179 mg/dL (ref 100–199)
HDL: 76 mg/dL (ref >39)
LDL Chol Calc (NIH): 88 mg/dL (ref 0–99)
Triglycerides: 85 mg/dL (ref 0–149)
VLDL Cholesterol Cal: 15 mg/dL (ref 5–40)

## 2021-11-21 LAB — HEMOGLOBIN A1C
Est. average glucose Bld gHb Est-mCnc: 128 mg/dL
Hgb A1c MFr Bld: 6.1 % — ABNORMAL HIGH (ref 4.8–5.6)

## 2021-11-21 LAB — TSH: TSH: 2.04 u[IU]/mL (ref 0.450–4.500)

## 2021-11-22 ENCOUNTER — Inpatient Hospital Stay (HOSPITAL_COMMUNITY): Admission: RE | Admit: 2021-11-22 | Payer: Medicare Other | Source: Ambulatory Visit

## 2021-11-22 ENCOUNTER — Telehealth: Payer: Self-pay

## 2021-11-22 ENCOUNTER — Telehealth: Payer: Self-pay | Admitting: Family Medicine

## 2021-11-22 NOTE — Telephone Encounter (Signed)
Patient return call about lab results. Call [phone].

## 2021-11-22 NOTE — Telephone Encounter (Signed)
Pt aware of results 

## 2021-11-22 NOTE — Telephone Encounter (Signed)
Please call the pt  °

## 2021-11-22 NOTE — Telephone Encounter (Signed)
Aware of results. 

## 2021-11-23 ENCOUNTER — Other Ambulatory Visit: Payer: Self-pay

## 2021-11-23 ENCOUNTER — Telehealth: Payer: Self-pay

## 2021-11-23 DIAGNOSIS — J302 Other seasonal allergic rhinitis: Secondary | ICD-10-CM

## 2021-11-23 MED ORDER — FLUTICASONE PROPIONATE 50 MCG/ACT NA SUSP: 2.0000 | Freq: Every day | NASAL | 2 refills | Status: DC

## 2021-11-23 NOTE — Telephone Encounter (Signed)
Rx sent in

## 2021-11-23 NOTE — Telephone Encounter (Signed)
Patient called need refill fluticasone (FLONASE) 50 MCG/ACT nasal spray  [employer]

## 2021-11-24 ENCOUNTER — Encounter: Payer: Self-pay | Admitting: Family Medicine

## 2021-11-24 NOTE — Assessment & Plan Note (Signed)
Controlled, no change in medication  

## 2021-11-24 NOTE — Assessment & Plan Note (Signed)
DASH diet and commitment to daily physical activity for a minimum of 30 minutes discussed and encouraged, as a part of hypertension management. The importance of attaining a healthy weight is also discussed.  BP/Weight 11/20/2021 02/25/2021 12/28/2020 10/12/2020 09/28/2020 09/25/2020 09/81/1914  Systolic BP 782 956 213 086 578 469 629  Diastolic BP 72 75 72 74 82 73 82  Wt. (Lbs) 163 155 163.12 164 165 - 165  BMI 29.81 28.35 29.84 30 30.18 - 30.18     Controlled, no change in medication

## 2021-11-24 NOTE — Assessment & Plan Note (Signed)
Deteriorated Patient educated about the importance of limiting  Carbohydrate intake , the need to commit to daily physical activity for a minimum of 30 minutes , and to commit weight loss. The fact that changes in all these areas will reduce or eliminate all together the development of diabetes is stressed.   Diabetic Labs Latest Ref Rng & Units 11/20/2021 02/25/2021 08/16/2020 04/04/2017 03/19/2017  HbA1c 4.8 - 5.6 % 6.1(H) - 5.8(H) - -  Chol 100 - 199 mg/dL 179 - 246(H) - -  HDL >39 mg/dL 76 - 70 - -  Calc LDL 0 - 99 mg/dL 88 - 151(H) - -  Triglycerides 0 - 149 mg/dL 85 - 129 - -  Creatinine 0.57 - 1.00 mg/dL 0.87 0.81 0.91 0.90 0.76   BP/Weight 11/20/2021 02/25/2021 12/28/2020 10/12/2020 09/28/2020 09/25/2020 68/12/2120  Systolic BP 482 500 370 488 891 694 503  Diastolic BP 72 75 72 74 82 73 82  Wt. (Lbs) 163 155 163.12 164 165 - 165  BMI 29.81 28.35 29.84 30 30.18 - 30.18   No flowsheet data found.

## 2021-11-24 NOTE — Assessment & Plan Note (Signed)
Hyperlipidemia:Low fat diet discussed and encouraged.   Lipid Panel  Lab Results  Component Value Date   CHOL 179 11/20/2021   HDL 76 11/20/2021   LDLCALC 88 11/20/2021   TRIG 85 11/20/2021   CHOLHDL 2.4 11/20/2021     Controlled, no change in medication

## 2021-11-28 ENCOUNTER — Other Ambulatory Visit: Payer: Self-pay | Admitting: Family Medicine

## 2021-11-28 ENCOUNTER — Other Ambulatory Visit: Payer: Self-pay

## 2021-11-28 ENCOUNTER — Inpatient Hospital Stay (HOSPITAL_COMMUNITY): Admission: RE | Admit: 2021-11-28 | Payer: Medicare Other | Source: Ambulatory Visit

## 2021-11-28 DIAGNOSIS — F4323 Adjustment disorder with mixed anxiety and depressed mood: Secondary | ICD-10-CM

## 2021-12-03 ENCOUNTER — Other Ambulatory Visit: Payer: Self-pay

## 2021-12-10 ENCOUNTER — Encounter (INDEPENDENT_AMBULATORY_CARE_PROVIDER_SITE_OTHER): Payer: Self-pay | Admitting: *Deleted

## 2021-12-17 ENCOUNTER — Encounter (INDEPENDENT_AMBULATORY_CARE_PROVIDER_SITE_OTHER): Payer: Self-pay

## 2021-12-17 ENCOUNTER — Ambulatory Visit (INDEPENDENT_AMBULATORY_CARE_PROVIDER_SITE_OTHER): Payer: Medicare Other | Admitting: Gastroenterology

## 2021-12-17 ENCOUNTER — Other Ambulatory Visit (INDEPENDENT_AMBULATORY_CARE_PROVIDER_SITE_OTHER): Payer: Self-pay

## 2021-12-17 ENCOUNTER — Encounter (INDEPENDENT_AMBULATORY_CARE_PROVIDER_SITE_OTHER): Payer: Self-pay | Admitting: Gastroenterology

## 2021-12-17 ENCOUNTER — Other Ambulatory Visit: Payer: Self-pay

## 2021-12-17 ENCOUNTER — Telehealth (INDEPENDENT_AMBULATORY_CARE_PROVIDER_SITE_OTHER): Payer: Self-pay

## 2021-12-17 ENCOUNTER — Other Ambulatory Visit: Payer: Self-pay | Admitting: Family Medicine

## 2021-12-17 VITALS — BP 110/69 | HR 89 | Temp 98.1°F | Ht 63.0 in | Wt 161.4 lb

## 2021-12-17 DIAGNOSIS — K219 Gastro-esophageal reflux disease without esophagitis: Secondary | ICD-10-CM | POA: Diagnosis not present

## 2021-12-17 MED ORDER — PEG 3350-KCL-NA BICARB-NACL 420 G PO SOLR: 4000.0000 mL | ORAL | 0 refills | Status: DC

## 2021-12-17 NOTE — Progress Notes (Signed)
[name], M.D. Gastroenterology & Hepatology [facility] [address] [place], [place] [address]  Primary Care Physician: Fayrene Helper, MD [address], [place] [address]  I will communicate my assessment and recommendations to the referring MD via EMR.  Problems: GERD  History of Present Illness: [name] is a 71 y.o. female with PMH GERD, anxiety, HLD, OA, who presents for follow up of GERD and CRC screening.  The patient was last seen on 10/12/2020. At that time, the patient was endorsing dysphagia symptoms and early satiety for which she was scheduled to undergo an EGD, as well as a colonoscopy for screening purposes.  However these procedures were canceled due to COVID and she did not reschedule them.  Patient states her GERD is well controlled while taking pantoprazole 20 mg daily. She avoids eating food that is fried or hot sauce, also avoid red meat - all these food triggers heartburn. No dysphagia or odynophagia as she has been taking the pantoprazole compliantly.  The patient denies having any nausea, vomiting, fever, chills, hematochezia, melena, hematemesis, abdominal distention, abdominal pain, diarrhea, jaundice, pruritus or weight loss.  Last EGD: never Last Colonoscopy: 2009 - normal per chart report  Past Medical History: Past Medical History:  Diagnosis Date   Allergy    Anxiety    Back pain with radiation 07/13/2012   GERD (gastroesophageal reflux disease)    Hyperlipidemia    Osteoarthritis     Past Surgical History: Past Surgical History:  Procedure Laterality Date   cyst removed from right breast     LAPAROSCOPIC SALPINGO OOPHERECTOMY Right 04/09/2017   Procedure: LAPAROSCOPIC RIGHT SALPINGO OOPHORECTOMY; RIGHT OVARIAN BENIGN CYSTIC TERATOMA;  Surgeon: Florian Buff, MD;  Location: [facility];  Service: Gynecology;  Laterality: Right;   PARTIAL HYSTERECTOMY     TUBAL  LIGATION      Family History: Family History  Problem Relation Age of Onset   Heart disease Mother    Heart disease Father    Diabetes Father    Hypertension Sister    Parkinson's disease Brother    Heart disease Maternal Grandmother    Cancer Paternal Grandmother        breast   Diabetes Paternal Grandfather    Hypertension Sister    Hypertension Sister    Down syndrome Son    Eczema Son    Post-traumatic stress disorder Daughter     Social History: Social History   Tobacco Use  Smoking Status Never  Smokeless Tobacco Never   Social History   Substance and Sexual Activity  Alcohol Use Yes   Comment: occasionally wine   Social History   Substance and Sexual Activity  Drug Use No    Allergies: Allergies  Allergen Reactions   Ibuprofen Palpitations and Other (See Comments)    Rapid heart rate.    Medications: Current Outpatient Medications  Medication Sig Dispense Refill   amLODipine (NORVASC) 5 MG tablet TAKE ONE TABLET BY MOUTH ONCE DAILY. 30 tablet 0   Ascorbic Acid (VITAMIN C PO) Take 1 tablet by mouth daily. Power C     citalopram (CELEXA) 40 MG tablet TAKE ONE TABLET BY MOUTH ONCE DAILY. 30 tablet 0   clotrimazole-betamethasone (LOTRISONE) cream Apply 1 application topically 2 (two) times daily. 45 g 1   ELDERBERRY PO Take 2 capsules by mouth daily.     fluticasone (FLONASE) 50 MCG/ACT nasal spray Place 2 sprays into both nostrils  daily. Can do in the morning, or do one spray in more and one at night. 16 g 2   gabapentin (NEURONTIN) 300 MG capsule Take 1 capsule (300 mg total) by mouth at bedtime. 90 capsule 3   loratadine (CLARITIN) 10 MG tablet TAKE (1) TABLET BY MOUTH ONCE DAILY AS NEEDED FOR ALLERGIES. 90 tablet 0   Omega-3 Fatty Acids (FISH OIL CONCENTRATE PO) Take 1,576 mg by mouth daily. 788 mg each     oxybutynin (DITROPAN) 5 MG tablet TAKE (1) TABLET BY MOUTH TWICE DAILY. (Patient taking differently: daily at 6 (six) AM.) 60 tablet 0    pantoprazole (PROTONIX) 20 MG tablet TAKE 1 TABLET BY MOUTH ONCE DAILY. 30 tablet 0   rosuvastatin (CRESTOR) 40 MG tablet TAKE 1 TABLET BY MOUTH ONCE DAILY. 90 tablet 0   vitamin B-12 (CYANOCOBALAMIN) 1000 MCG tablet Take 1,000 mcg by mouth daily.     Vitamin E 400 units TABS Take 400 Units by mouth daily.     No current facility-administered medications for this visit.    Review of Systems: GENERAL: negative for malaise, night sweats HEENT: No changes in hearing or vision, no nose bleeds or other nasal problems. NECK: Negative for lumps, goiter, pain and significant neck swelling RESPIRATORY: Negative for cough, wheezing CARDIOVASCULAR: Negative for chest pain, leg swelling, palpitations, orthopnea GI: SEE HPI MUSCULOSKELETAL: Negative for joint pain or swelling, back pain, and muscle pain. SKIN: Negative for lesions, rash PSYCH: Negative for sleep disturbance, mood disorder and recent psychosocial stressors. HEMATOLOGY Negative for prolonged bleeding, bruising easily, and swollen nodes. ENDOCRINE: Negative for cold or heat intolerance, polyuria, polydipsia and goiter. NEURO: negative for tremor, gait imbalance, syncope and seizures. The remainder of the review of systems is noncontributory.   Physical Exam: BP 110/69 (BP Location: Right Arm, Patient Position: Sitting, Cuff Size: Large)    Pulse 89    Temp 98.1 F (36.7 C) (Oral)    Ht 5\' 3"  (1.6 m)    Wt 161 lb 6.4 oz (73.2 kg)    BMI 28.59 kg/m  GENERAL: The patient is AO x3, in no acute distress. HEENT: Head is normocephalic and atraumatic. EOMI are intact. Mouth is well hydrated and without lesions. NECK: Supple. No masses LUNGS: Clear to auscultation. No presence of rhonchi/wheezing/rales. Adequate chest expansion HEART: RRR, normal s1 and s2. ABDOMEN: Soft, nontender, no guarding, no peritoneal signs, and nondistended. BS +. No masses. EXTREMITIES: Without any cyanosis, clubbing, rash, lesions or edema. NEUROLOGIC: AOx3, no  focal motor deficit. SKIN: no jaundice, no rashes  Imaging/Labs: as above  I personally reviewed and interpreted the available labs, imaging and endoscopic files.  Impression and Plan: [name] is a 71 y.o. female with PMH GERD, anxiety, HLD, OA, who presents for follow up of GERD and CRC screening.  The patient has presented major improvement of her symptoms of GERD after she started taking the low-dose PPI compliantly, this actually led to resolution of her dysphagia.  I do not consider we need to perform any further endoscopic evaluation of her her dysphagia at this point as this has resolved.  She should continue on the same dose of pantoprazole.  However, she is due for colorectal cancer screening. More than 50% of the office visit was dedicated to discussing the procedure, including the day of and risks involved. Patient understands what the procedure involves including the benefits and any risks. Patient understands alternatives to the proposed procedure. Risks including (but not limited to) bleeding, tearing  of the lining (perforation), rupture of adjacent organs, problems with heart and lung function, infection, and medication reactions. A small percentage of complications may require surgery, hospitalization, repeat endoscopic procedure, and/or transfusion. A small percentage of polyps and other tumors may not be seen.  - Continue  pantoprazole 20 mg daily - Schedule colonoscopy  All questions were answered.      Harvel Quale, MD Gastroenterology and Hepatology [facility]

## 2021-12-17 NOTE — Telephone Encounter (Signed)
Achille Xiang Ann Carlous Olivares, CMA  ?

## 2021-12-17 NOTE — H&P (View-Only) (Signed)
[name], M.D. Gastroenterology & Hepatology [facility] [address] [place], [place] [address]  Primary Care Physician: Fayrene Helper, MD [address], [place] [address]  I will communicate my assessment and recommendations to the referring MD via EMR.  Problems: GERD  History of Present Illness: NAKITA SANTERRE is a 71 y.o. female with PMH GERD, anxiety, HLD, OA, who presents for follow up of GERD and CRC screening.  The patient was last seen on 10/12/2020. At that time, the patient was endorsing dysphagia symptoms and early satiety for which she was scheduled to undergo an EGD, as well as a colonoscopy for screening purposes.  However these procedures were canceled due to COVID and she did not reschedule them.  Patient states her GERD is well controlled while taking pantoprazole 20 mg daily. She avoids eating food that is fried or hot sauce, also avoid red meat - all these food triggers heartburn. No dysphagia or odynophagia as she has been taking the pantoprazole compliantly.  The patient denies having any nausea, vomiting, fever, chills, hematochezia, melena, hematemesis, abdominal distention, abdominal pain, diarrhea, jaundice, pruritus or weight loss.  Last EGD: never Last Colonoscopy: 2009 - normal per chart report  Past Medical History: Past Medical History:  Diagnosis Date   Allergy    Anxiety    Back pain with radiation 07/13/2012   GERD (gastroesophageal reflux disease)    Hyperlipidemia    Osteoarthritis     Past Surgical History: Past Surgical History:  Procedure Laterality Date   cyst removed from right breast     LAPAROSCOPIC SALPINGO OOPHERECTOMY Right 04/09/2017   Procedure: LAPAROSCOPIC RIGHT SALPINGO OOPHORECTOMY; RIGHT OVARIAN BENIGN CYSTIC TERATOMA;  Surgeon: Florian Buff, MD;  Location: [facility];  Service: Gynecology;  Laterality: Right;   PARTIAL HYSTERECTOMY     TUBAL  LIGATION      Family History: Family History  Problem Relation Age of Onset   Heart disease Mother    Heart disease Father    Diabetes Father    Hypertension Sister    Parkinson's disease Brother    Heart disease Maternal Grandmother    Cancer Paternal Grandmother        breast   Diabetes Paternal Grandfather    Hypertension Sister    Hypertension Sister    Down syndrome Son    Eczema Son    Post-traumatic stress disorder Daughter     Social History: Social History   Tobacco Use  Smoking Status Never  Smokeless Tobacco Never   Social History   Substance and Sexual Activity  Alcohol Use Yes   Comment: occasionally wine   Social History   Substance and Sexual Activity  Drug Use No    Allergies: Allergies  Allergen Reactions   Ibuprofen Palpitations and Other (See Comments)    Rapid heart rate.    Medications: Current Outpatient Medications  Medication Sig Dispense Refill   amLODipine (NORVASC) 5 MG tablet TAKE ONE TABLET BY MOUTH ONCE DAILY. 30 tablet 0   Ascorbic Acid (VITAMIN C PO) Take 1 tablet by mouth daily. Power C     citalopram (CELEXA) 40 MG tablet TAKE ONE TABLET BY MOUTH ONCE DAILY. 30 tablet 0   clotrimazole-betamethasone (LOTRISONE) cream Apply 1 application topically 2 (two) times daily. 45 g 1   ELDERBERRY PO Take 2 capsules by mouth daily.     fluticasone (FLONASE) 50 MCG/ACT nasal spray Place 2 sprays into both nostrils  daily. Can do in the morning, or do one spray in more and one at night. 16 g 2   gabapentin (NEURONTIN) 300 MG capsule Take 1 capsule (300 mg total) by mouth at bedtime. 90 capsule 3   loratadine (CLARITIN) 10 MG tablet TAKE (1) TABLET BY MOUTH ONCE DAILY AS NEEDED FOR ALLERGIES. 90 tablet 0   Omega-3 Fatty Acids (FISH OIL CONCENTRATE PO) Take 1,576 mg by mouth daily. 788 mg each     oxybutynin (DITROPAN) 5 MG tablet TAKE (1) TABLET BY MOUTH TWICE DAILY. (Patient taking differently: daily at 6 (six) AM.) 60 tablet 0    pantoprazole (PROTONIX) 20 MG tablet TAKE 1 TABLET BY MOUTH ONCE DAILY. 30 tablet 0   rosuvastatin (CRESTOR) 40 MG tablet TAKE 1 TABLET BY MOUTH ONCE DAILY. 90 tablet 0   vitamin B-12 (CYANOCOBALAMIN) 1000 MCG tablet Take 1,000 mcg by mouth daily.     Vitamin E 400 units TABS Take 400 Units by mouth daily.     No current facility-administered medications for this visit.    Review of Systems: GENERAL: negative for malaise, night sweats HEENT: No changes in hearing or vision, no nose bleeds or other nasal problems. NECK: Negative for lumps, goiter, pain and significant neck swelling RESPIRATORY: Negative for cough, wheezing CARDIOVASCULAR: Negative for chest pain, leg swelling, palpitations, orthopnea GI: SEE HPI MUSCULOSKELETAL: Negative for joint pain or swelling, back pain, and muscle pain. SKIN: Negative for lesions, rash PSYCH: Negative for sleep disturbance, mood disorder and recent psychosocial stressors. HEMATOLOGY Negative for prolonged bleeding, bruising easily, and swollen nodes. ENDOCRINE: Negative for cold or heat intolerance, polyuria, polydipsia and goiter. NEURO: negative for tremor, gait imbalance, syncope and seizures. The remainder of the review of systems is noncontributory.   Physical Exam: BP 110/69 (BP Location: Right Arm, Patient Position: Sitting, Cuff Size: Large)    Pulse 89    Temp 98.1 F (36.7 C) (Oral)    Ht 5\' 3"  (1.6 m)    Wt 161 lb 6.4 oz (73.2 kg)    BMI 28.59 kg/m  GENERAL: The patient is AO x3, in no acute distress. HEENT: Head is normocephalic and atraumatic. EOMI are intact. Mouth is well hydrated and without lesions. NECK: Supple. No masses LUNGS: Clear to auscultation. No presence of rhonchi/wheezing/rales. Adequate chest expansion HEART: RRR, normal s1 and s2. ABDOMEN: Soft, nontender, no guarding, no peritoneal signs, and nondistended. BS +. No masses. EXTREMITIES: Without any cyanosis, clubbing, rash, lesions or edema. NEUROLOGIC: AOx3, no  focal motor deficit. SKIN: no jaundice, no rashes  Imaging/Labs: as above  I personally reviewed and interpreted the available labs, imaging and endoscopic files.  Impression and Plan: KIRTI CARL is a 71 y.o. female with PMH GERD, anxiety, HLD, OA, who presents for follow up of GERD and CRC screening.  The patient has presented major improvement of her symptoms of GERD after she started taking the low-dose PPI compliantly, this actually led to resolution of her dysphagia.  I do not consider we need to perform any further endoscopic evaluation of her her dysphagia at this point as this has resolved.  She should continue on the same dose of pantoprazole.  However, she is due for colorectal cancer screening. More than 50% of the office visit was dedicated to discussing the procedure, including the day of and risks involved. Patient understands what the procedure involves including the benefits and any risks. Patient understands alternatives to the proposed procedure. Risks including (but not limited to) bleeding, tearing  of the lining (perforation), rupture of adjacent organs, problems with heart and lung function, infection, and medication reactions. A small percentage of complications may require surgery, hospitalization, repeat endoscopic procedure, and/or transfusion. A small percentage of polyps and other tumors may not be seen.  - Continue  pantoprazole 20 mg daily - Schedule colonoscopy  All questions were answered.      Harvel Quale, MD Gastroenterology and Hepatology [facility]

## 2021-12-17 NOTE — Progress Notes (Signed)
This encounter was created in error - please disregard.

## 2021-12-17 NOTE — Patient Instructions (Signed)

## 2021-12-17 NOTE — Patient Instructions (Signed)
Continue  pantoprazole 20 mg daily Schedule colonoscopy

## 2021-12-26 NOTE — Patient Instructions (Signed)
? ? ? ? ? ? [name] ? 12/26/2021  ?  ? @PREFPERIOPPHARMACY @ ? ? Your procedure is scheduled on  01/02/2022. ? ? Report to [facility] at  0930  A.M. ? ? Call this number if you have problems the morning of surgery: ? [phone] ? ? Remember: Follow the diet and prep instructions given to you by the office. ?  ?  ? Take these medicines the morning of surgery with A SIP OF WATER  ? ?               amlodipine, celexa, ditropan, protonix. ?  ? ? Do not wear jewelry, make-up or nail polish. ? Do not wear lotions, powders, or perfumes, or deodorant. ? Do not shave 48 hours prior to surgery.  Men may shave face and neck. ? Do not bring valuables to the hospital. ? [name] is not responsible for any belongings or valuables. ? ?Contacts, dentures or bridgework may not be worn into surgery.  Leave your suitcase in the car.  After surgery it may be brought to your room. ? ?For patients admitted to the hospital, discharge time will be determined by your treatment team. ? ?Patients discharged the day of surgery will not be allowed to drive home and must have someone with them for 24 hours.  ? ? ?Special instructions:   DO NOT smoke tobacco or vape for 24 hours before your procedure. ? ?Please read over the following fact sheets that you were given. ?Anesthesia Post-op Instructions and Care and Recovery After Surgery ?  ? ? ? Colonoscopy, Adult, Care After ?This sheet gives you information about how to care for yourself after your procedure. Your health care provider may also give you more specific instructions. If you have problems or questions, contact your health care provider. ?What can I expect after the procedure? ?After the procedure, it is common to have: ?A small amount of blood in your stool for 24 hours after the procedure. ?Some gas. ?Mild cramping or bloating of your abdomen. ?Follow these instructions at home: ?Eating and drinking ? ?Drink enough fluid to keep your urine pale yellow. ?Follow instructions from  your health care provider about eating or drinking restrictions. ?Resume your normal diet as instructed by your health care provider. Avoid heavy or fried foods that are hard to digest. ?Activity ?Rest as told by your health care provider. ?Avoid sitting for a long time without moving. Get up to take short walks every 1-2 hours. This is important to improve blood flow and breathing. Ask for help if you feel weak or unsteady. ?Return to your normal activities as told by your health care provider. Ask your health care provider what activities are safe for you. ?Managing cramping and bloating ? ?Try walking around when you have cramps or feel bloated. ?Apply heat to your abdomen as told by your health care provider. Use the heat source that your health care provider recommends, such as a moist heat pack or a heating pad. ?Place a towel between your skin and the heat source. ?Leave the heat on for 20-30 minutes. ?Remove the heat if your skin turns bright red. This is especially important if you are unable to feel pain, heat, or cold. You may have a greater risk of getting burned. ?General instructions ?If you were given a sedative during the procedure, it can affect you for several hours. Do not drive or operate machinery until your health care provider says that it is safe. ?  For the first 24 hours after the procedure: ?Do not sign important documents. ?Do not drink alcohol. ?Do your regular daily activities at a slower pace than normal. ?Eat soft foods that are easy to digest. ?Take over-the-counter and prescription medicines only as told by your health care provider. ?Keep all follow-up visits as told by your health care provider. This is important. ?Contact a health care provider if: ?You have blood in your stool 2-3 days after the procedure. ?Get help right away if you have: ?More than a small spotting of blood in your stool. ?Large blood clots in your stool. ?Swelling of your abdomen. ?Nausea or vomiting. ?A  fever. ?Increasing pain in your abdomen that is not relieved with medicine. ?Summary ?After the procedure, it is common to have a small amount of blood in your stool. You may also have mild cramping and bloating of your abdomen. ?If you were given a sedative during the procedure, it can affect you for several hours. Do not drive or operate machinery until your health care provider says that it is safe. ?Get help right away if you have a lot of blood in your stool, nausea or vomiting, a fever, or increased pain in your abdomen. ?This information is not intended to replace advice given to you by your health care provider. Make sure you discuss any questions you have with your health care provider. ?Document Revised: 08/20/2019 Document Reviewed: 05/10/2019 ?Elsevier Patient Education ? [place]. ?Monitored Anesthesia Care, Care After ?This sheet gives you information about how to care for yourself after your procedure. Your health care provider may also give you more specific instructions. If you have problems or questions, contact your health care provider. ?What can I expect after the procedure? ?After the procedure, it is common to have: ?Tiredness. ?Forgetfulness about what happened after the procedure. ?Impaired judgment for important decisions. ?Nausea or vomiting. ?Some difficulty with balance. ?Follow these instructions at home: ?For the time period you were told by your health care provider: ?  ?Rest as needed. ?Do not participate in activities where you could fall or become injured. ?Do not drive or use machinery. ?Do not drink alcohol. ?Do not take sleeping pills or medicines that cause drowsiness. ?Do not make important decisions or sign legal documents. ?Do not take care of children on your own. ?Eating and drinking ?Follow the diet that is recommended by your health care provider. ?Drink enough fluid to keep your urine pale yellow. ?If you vomit: ?Drink water, juice, or soup when you can drink  without vomiting. ?Make sure you have little or no nausea before eating solid foods. ?General instructions ?Have a responsible adult stay with you for the time you are told. It is important to have someone help care for you until you are awake and alert. ?Take over-the-counter and prescription medicines only as told by your health care provider. ?If you have sleep apnea, surgery and certain medicines can increase your risk for breathing problems. Follow instructions from your health care provider about wearing your sleep device: ?Anytime you are sleeping, including during daytime naps. ?While taking prescription pain medicines, sleeping medicines, or medicines that make you drowsy. ?Avoid smoking. ?Keep all follow-up visits as told by your health care provider. This is important. ?Contact a health care provider if: ?You keep feeling nauseous or you keep vomiting. ?You feel light-headed. ?You are still sleepy or having trouble with balance after 24 hours. ?You develop a rash. ?You have a fever. ?You have redness or swelling  around the IV site. ?Get help right away if: ?You have trouble breathing. ?You have new-onset confusion at home. ?Summary ?For several hours after your procedure, you may feel tired. You may also be forgetful and have poor judgment. ?Have a responsible adult stay with you for the time you are told. It is important to have someone help care for you until you are awake and alert. ?Rest as told. Do not drive or operate machinery. Do not drink alcohol or take sleeping pills. ?Get help right away if you have trouble breathing, or if you suddenly become confused. ?This information is not intended to replace advice given to you by your health care provider. Make sure you discuss any questions you have with your health care provider. ?Document Revised: 06/29/2020 Document Reviewed: 09/16/2019 ?Elsevier Patient Education ? [place]. ? ?

## 2021-12-28 ENCOUNTER — Encounter (HOSPITAL_COMMUNITY)
Admission: RE | Admit: 2021-12-28 | Discharge: 2021-12-28 | Disposition: A | Discharge status: Home / Self Care | Payer: Medicare Other | Source: Ambulatory Visit | Attending: Gastroenterology | Admitting: Gastroenterology

## 2021-12-28 ENCOUNTER — Other Ambulatory Visit: Payer: Self-pay

## 2021-12-28 ENCOUNTER — Encounter (HOSPITAL_COMMUNITY): Payer: Self-pay

## 2021-12-28 HISTORY — DX: Unspecified glaucoma: H40.9

## 2022-01-02 ENCOUNTER — Encounter (INDEPENDENT_AMBULATORY_CARE_PROVIDER_SITE_OTHER): Payer: Self-pay | Admitting: *Deleted

## 2022-01-02 ENCOUNTER — Encounter (HOSPITAL_COMMUNITY)
Admission: RE | Disposition: A | Discharge status: Home / Self Care | Payer: Self-pay | Source: Home / Self Care | Attending: Gastroenterology

## 2022-01-02 ENCOUNTER — Encounter (HOSPITAL_COMMUNITY): Payer: Self-pay | Admitting: Gastroenterology

## 2022-01-02 ENCOUNTER — Ambulatory Visit (HOSPITAL_BASED_OUTPATIENT_CLINIC_OR_DEPARTMENT_OTHER): Payer: Medicare Other | Admitting: Anesthesiology

## 2022-01-02 ENCOUNTER — Ambulatory Visit (HOSPITAL_COMMUNITY)
Admission: RE | Admit: 2022-01-02 | Discharge: 2022-01-02 | Disposition: A | Discharge status: Home / Self Care | Payer: Medicare Other | Attending: Gastroenterology | Admitting: Gastroenterology

## 2022-01-02 ENCOUNTER — Ambulatory Visit (HOSPITAL_COMMUNITY): Payer: Medicare Other | Admitting: Anesthesiology

## 2022-01-02 DIAGNOSIS — K648 Other hemorrhoids: Secondary | ICD-10-CM | POA: Diagnosis not present

## 2022-01-02 DIAGNOSIS — Z79899 Other long term (current) drug therapy: Secondary | ICD-10-CM | POA: Diagnosis not present

## 2022-01-02 DIAGNOSIS — E785 Hyperlipidemia, unspecified: Secondary | ICD-10-CM | POA: Diagnosis not present

## 2022-01-02 DIAGNOSIS — K219 Gastro-esophageal reflux disease without esophagitis: Secondary | ICD-10-CM | POA: Diagnosis not present

## 2022-01-02 DIAGNOSIS — F32A Depression, unspecified: Secondary | ICD-10-CM | POA: Diagnosis not present

## 2022-01-02 DIAGNOSIS — Z1211 Encounter for screening for malignant neoplasm of colon: Secondary | ICD-10-CM

## 2022-01-02 DIAGNOSIS — M1990 Unspecified osteoarthritis, unspecified site: Secondary | ICD-10-CM | POA: Insufficient documentation

## 2022-01-02 DIAGNOSIS — F419 Anxiety disorder, unspecified: Secondary | ICD-10-CM | POA: Diagnosis not present

## 2022-01-02 HISTORY — PX: COLONOSCOPY WITH PROPOFOL: SHX5780

## 2022-01-02 LAB — HM COLONOSCOPY

## 2022-01-02 SURGERY — COLONOSCOPY WITH PROPOFOL
Anesthesia: General

## 2022-01-02 MED ORDER — PROPOFOL 10 MG/ML IV BOLUS
INTRAVENOUS | Status: DC | PRN
  Administered 2022-01-02: 20 mg via INTRAVENOUS
  Administered 2022-01-02: 80 mg via INTRAVENOUS

## 2022-01-02 MED ORDER — LACTATED RINGERS IV SOLN
INTRAVENOUS | Status: DC
  Administered 2022-01-02: 1000 mL via INTRAVENOUS

## 2022-01-02 MED ORDER — LACTATED RINGERS IV SOLN
INTRAVENOUS | Status: DC | PRN
Start: 2022-01-02 — End: 2022-01-02

## 2022-01-02 MED ORDER — PROPOFOL 500 MG/50ML IV EMUL
INTRAVENOUS | Status: DC | PRN
  Administered 2022-01-02: 200 ug/kg/min via INTRAVENOUS

## 2022-01-02 MED ORDER — LIDOCAINE HCL (CARDIAC) PF 100 MG/5ML IV SOSY
PREFILLED_SYRINGE | INTRAVENOUS | Status: DC | PRN
  Administered 2022-01-02: 50 mg via INTRAVENOUS

## 2022-01-02 NOTE — Transfer of Care (Signed)
Immediate Anesthesia Transfer of Care Note ? ?Patient: [name] ? ?Procedure(s) Performed: COLONOSCOPY WITH PROPOFOL ? ?Patient Location: Short Stay ? ?Anesthesia Type:General ? ?Level of Consciousness: awake and alert  ? ?Airway & Oxygen Therapy: Patient Spontanous Breathing ? ?Post-op Assessment: Report given to RN and Post -op Vital signs reviewed and stable ? ?Post vital signs: Reviewed and stable ? ?Last Vitals:  ?Vitals Value Taken Time  ?BP    ?Temp    ?Pulse    ?Resp    ?SpO2    ? ? ?Last Pain:  ?Vitals:  ? 01/02/22 1213  ?TempSrc:   ?PainSc: 0-No pain  ?   ? ?Patients Stated Pain Goal: 4 (01/02/22 0959) ? ?Complications: No notable events documented. ?

## 2022-01-02 NOTE — Interval H&P Note (Signed)
History and Physical Interval Note: ? ?01/02/2022 ?10:03 AM ? ?[name]  has presented today for surgery, with the diagnosis of Screening colonoscopy.  The various methods of treatment have been discussed with the patient and family. After consideration of risks, benefits and other options for treatment, the patient has consented to  Procedure(s) with comments: ?COLONOSCOPY WITH PROPOFOL (N/A) - 1130 as a surgical intervention.  The patient's history has been reviewed, patient examined, no change in status, stable for surgery.  I have reviewed the patient's chart and labs.  Questions were answered to the patient's satisfaction.   ? ? ?[name] ? ? ?

## 2022-01-02 NOTE — Discharge Instructions (Addendum)
You are being discharged to home.  ?Resume your previous diet.  ?Your physician has recommended a repeat colonoscopy in 10 years for screening purposes, if medically fit.  ?

## 2022-01-02 NOTE — Anesthesia Postprocedure Evaluation (Signed)
Anesthesia Post Note ? ?Patient: [name] ? ?Procedure(s) Performed: COLONOSCOPY WITH PROPOFOL ? ?Patient location during evaluation: Phase II ?Anesthesia Type: General ?Level of consciousness: awake ?Pain management: pain level controlled ?Vital Signs Assessment: post-procedure vital signs reviewed and stable ?Respiratory status: spontaneous breathing and respiratory function stable ?Cardiovascular status: blood pressure returned to baseline and stable ?Postop Assessment: no headache and no apparent nausea or vomiting ?Anesthetic complications: no ?Comments: Late entry ? ? ?No notable events documented. ? ? ?Last Vitals:  ?Vitals:  ? 01/02/22 0959 01/02/22 1243  ?BP: (!) 146/88 118/64  ?Pulse:  71  ?Resp: 18 18  ?Temp:  36.6 ?C  ?SpO2: 100% 100%  ?  ?Last Pain:  ?Vitals:  ? 01/02/22 1243  ?TempSrc: Oral  ?PainSc: 0-No pain  ? ? ?  ?  ?  ?  ?  ?  ? ?Louann Sjogren ? ? ? ? ?

## 2022-01-02 NOTE — Anesthesia Preprocedure Evaluation (Signed)
Anesthesia Evaluation  ?Patient identified by MRN, date of birth, ID band ?Patient awake ? ? ? ?Reviewed: ?Allergy & Precautions, H&P , NPO status , Patient's Chart, lab work & pertinent test results, reviewed documented beta blocker date and time  ? ?Airway ?Mallampati: II ? ?TM Distance: >3 FB ?Neck ROM: full ? ? ? Dental ?no notable dental hx. ? ?  ?Pulmonary ?neg pulmonary ROS,  ?  ?Pulmonary exam normal ?breath sounds clear to auscultation ? ? ? ? ? ? Cardiovascular ?Exercise Tolerance: Good ?negative cardio ROS ? ? ?Rhythm:regular Rate:Normal ? ? ?  ?Neuro/Psych ?PSYCHIATRIC DISORDERS Anxiety Depression negative neurological ROS ?   ? GI/Hepatic ?Neg liver ROS, GERD  Medicated,  ?Endo/Other  ?negative endocrine ROS ? Renal/GU ?negative Renal ROS  ?negative genitourinary ?  ?Musculoskeletal ? ? Abdominal ?  ?Peds ? Hematology ?negative hematology ROS ?(+)   ?Anesthesia Other Findings ? ? Reproductive/Obstetrics ?negative OB ROS ? ?  ? ? ? ? ? ? ? ? ? ? ? ? ? ?  ?  ? ? ? ? ? ? ? ? ?Anesthesia Physical ?Anesthesia Plan ? ?ASA: 2 ? ?Anesthesia Plan: General  ? ?Post-op Pain Management:   ? ?Induction:  ? ?PONV Risk Score and Plan: Propofol infusion ? ?Airway Management Planned:  ? ?Additional Equipment:  ? ?Intra-op Plan:  ? ?Post-operative Plan:  ? ?Informed Consent: I have reviewed the patients History and Physical, chart, labs and discussed the procedure including the risks, benefits and alternatives for the proposed anesthesia with the patient or authorized representative who has indicated his/her understanding and acceptance.  ? ? ? ?Dental Advisory Given ? ?Plan Discussed with: CRNA ? ?Anesthesia Plan Comments:   ? ? ? ? ? ? ?Anesthesia Quick Evaluation ? ?

## 2022-01-02 NOTE — Op Note (Signed)
[facility] ?Patient Name: [name] ?Procedure Date: 01/02/2022 12:02 PM ?MRN: [id number] ?Date of Birth: 1951-01-16 ?Attending MD: [name] ,  ?CSN: [id number] ?Age: 71 ?Admit Type: Outpatient ?Procedure:                Colonoscopy ?Indications:              Screening for colorectal malignant neoplasm ?Providers:                [name], Hughie Closs RN, RN, Caprice Kluver,  ?                          Raphael Gibney, Technician ?Referring MD:              ?Medicines:                Monitored Anesthesia Care ?Complications:            No immediate complications. ?Estimated Blood Loss:     Estimated blood loss: none. ?Procedure:                Pre-Anesthesia Assessment: ?                          - Prior to the procedure, a History and Physical  ?                          was performed, and patient medications, allergies  ?                          and sensitivities were reviewed. The patient's  ?                          tolerance of previous anesthesia was reviewed. ?                          After obtaining informed consent, the colonoscope  ?                          was passed under direct vision. Throughout the  ?                          procedure, the patient's blood pressure, pulse, and  ?                          oxygen saturations were monitored continuously. The  ?                          PCF-HQ190L ([id number]) scope was introduced through  ?                          the anus and advanced to the the cecum, identified  ?                          by appendiceal orifice and ileocecal valve. The  ?                          colonoscopy was performed without  difficulty. The  ?                          patient tolerated the procedure well. The quality  ?                          of the bowel preparation was good. ?Scope In: 12:16:43 PM ?Scope Out: 12:39:31 PM ?Scope Withdrawal Time: 0 hours 12 minutes 15 seconds  ?Total Procedure Duration: 0 hours 22 minutes 48 seconds  ?Findings: ?     The  perianal and digital rectal examinations were normal. ?     The colon (entire examined portion) appeared normal. ?     Non-bleeding internal hemorrhoids were found during retroflexion. The  ?     hemorrhoids were small. ?Impression:               - The entire examined colon is normal. ?                          - Non-bleeding internal hemorrhoids. ?                          - No specimens collected. ?Moderate Sedation: ?     Per Anesthesia Care ?Recommendation:           - Discharge patient to home (ambulatory). ?                          - Resume previous diet. ?                          - Repeat colonoscopy in 10 years for screening  ?                          purposes, if medically fit. ?Procedure Code(s):        --- Professional --- ?                          O8416, Colorectal cancer screening; colonoscopy on  ?                          individual not meeting criteria for high risk ?Diagnosis Code(s):        --- Professional --- ?                          Z12.11, Encounter for screening for malignant  ?                          neoplasm of colon ?                          K64.8, Other hemorrhoids ?CPT copyright 2019 American Medical Association. All rights reserved. ?The codes documented in this report are preliminary and upon coder review may  ?be revised to meet current compliance requirements. ?[name], MD ?[name],  ?01/02/2022 12:45:45 PM ?This report has been signed electronically. ?Number of Addenda: 0 ?

## 2022-01-07 ENCOUNTER — Encounter (HOSPITAL_COMMUNITY): Payer: Self-pay | Admitting: Gastroenterology

## 2022-01-11 ENCOUNTER — Other Ambulatory Visit: Payer: Self-pay | Admitting: Family Medicine

## 2022-01-11 ENCOUNTER — Ambulatory Visit (HOSPITAL_COMMUNITY)
Admission: RE | Admit: 2022-01-11 | Discharge: 2022-01-11 | Disposition: A | Discharge status: Home / Self Care | Payer: Medicare Other | Source: Ambulatory Visit | Attending: Family Medicine | Admitting: Family Medicine

## 2022-01-11 ENCOUNTER — Other Ambulatory Visit: Payer: Self-pay

## 2022-01-11 DIAGNOSIS — F4323 Adjustment disorder with mixed anxiety and depressed mood: Secondary | ICD-10-CM

## 2022-01-11 DIAGNOSIS — Z1231 Encounter for screening mammogram for malignant neoplasm of breast: Secondary | ICD-10-CM | POA: Diagnosis not present

## 2022-01-14 ENCOUNTER — Ambulatory Visit (INDEPENDENT_AMBULATORY_CARE_PROVIDER_SITE_OTHER): Payer: Medicare Other

## 2022-01-14 ENCOUNTER — Other Ambulatory Visit: Payer: Self-pay

## 2022-01-14 DIAGNOSIS — Z0000 Encounter for general adult medical examination without abnormal findings: Secondary | ICD-10-CM | POA: Diagnosis not present

## 2022-01-14 NOTE — Progress Notes (Signed)
? ?Subjective:  ? [name] is a 71 y.o. female who presents for Medicare Annual (Subsequent) preventive examination. ?I connected with  [name] on 01/14/22 by a audio enabled telemedicine application and verified that I am speaking with the correct person using two identifiers. ? ?Patient Location: Home ? ?Provider Location: Office/Clinic ? ?I discussed the limitations of evaluation and management by telemedicine. The patient expressed understanding and agreed to proceed.  ?Review of Systems    ? ?  ? ?   ?Objective:  ?  ?There were no vitals filed for this visit. ?There is no height or weight on file to calculate BMI. ? ?Advanced Directives 12/28/2021 09/28/2020 09/28/2019 09/21/2018 04/04/2017 03/19/2017 09/11/2014  ?Does Patient Have a Medical Advance Directive? No No No Yes;No No No No  ?Does patient want to make changes to medical advance directive? - - - Yes (ED - Information included in AVS) - - -  ?Would patient like information on creating a medical advance directive? No - Patient declined No - Patient declined No - Patient declined No - Patient declined No - Patient declined - No - patient declined information  ? ? ?Current Medications (verified) ?Outpatient Encounter Medications as of 01/14/2022  ?Medication Sig  ? amLODipine (NORVASC) 5 MG tablet TAKE ONE TABLET BY MOUTH ONCE DAILY.  ? Ascorbic Acid (VITAMIN C PO) Take 1 tablet by mouth daily. Power C  ? citalopram (CELEXA) 40 MG tablet TAKE ONE TABLET BY MOUTH ONCE DAILY.  ? clotrimazole-betamethasone (LOTRISONE) cream Apply 1 application topically 2 (two) times daily.  ? ELDERBERRY PO Take 2 capsules by mouth daily.  ? fluticasone (FLONASE) 50 MCG/ACT nasal spray Place 2 sprays into both nostrils daily. Can do in the morning, or do one spray in more and one at night.  ? gabapentin (NEURONTIN) 300 MG capsule Take 1 capsule (300 mg total) by mouth at bedtime.  ? loratadine (CLARITIN) 10 MG tablet TAKE (1) TABLET BY MOUTH ONCE DAILY AS NEEDED FOR  ALLERGIES.  ? Omega-3 Fatty Acids (FISH OIL CONCENTRATE PO) Take 1,576 mg by mouth daily. 788 mg each  ? oxybutynin (DITROPAN) 5 MG tablet TAKE (1) TABLET BY MOUTH TWICE DAILY. (Patient taking differently: daily at 6 (six) AM.)  ? pantoprazole (PROTONIX) 20 MG tablet TAKE 1 TABLET BY MOUTH ONCE DAILY.  ? rosuvastatin (CRESTOR) 40 MG tablet TAKE 1 TABLET BY MOUTH ONCE DAILY.  ? vitamin B-12 (CYANOCOBALAMIN) 1000 MCG tablet Take 1,000 mcg by mouth daily.  ? Vitamin E 400 units TABS Take 400 Units by mouth daily.  ? ?No facility-administered encounter medications on file as of 01/14/2022.  ? ? ?Allergies (verified) ?Ibuprofen  ? ?History: ?Past Medical History:  ?Diagnosis Date  ? Allergy   ? Anxiety   ? 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 GERD (gastroesophageal reflux disease)   ? Glaucoma   ? Hyperlipidemia   ? Osteoarthritis   ? ?Past Surgical History:  ?Procedure Laterality Date  ? COLONOSCOPY WITH PROPOFOL N/A 01/02/2022  ? Procedure: COLONOSCOPY WITH PROPOFOL;  Surgeon: Harvel Quale, MD;  Location: [facility];  Service: Gastroenterology;  Laterality: N/A;  1130  ? cyst removed from right breast    ? LAPAROSCOPIC SALPINGO OOPHERECTOMY Right 04/09/2017  ? Procedure: LAPAROSCOPIC RIGHT SALPINGO OOPHORECTOMY; RIGHT OVARIAN BENIGN CYSTIC TERATOMA;  Surgeon: Florian Buff, MD;  Location: [facility];  Service: Gynecology;  Laterality: Right;  ? PARTIAL HYSTERECTOMY    ? TUBAL LIGATION    ? ?Family  History  ?Problem Relation Age of Onset  ? Heart disease Mother   ? Heart disease Father   ? Diabetes Father   ? Hypertension Sister   ? Parkinson's disease Brother   ? Heart disease Maternal Grandmother   ? Cancer Paternal Grandmother   ?     breast  ? Diabetes Paternal Grandfather   ? Hypertension Sister   ? Hypertension Sister   ? Down syndrome Son   ? Eczema Son   ? Post-traumatic stress disorder Daughter   ? ?Social History  ? ?Socioeconomic History  ? Marital status: Single  ?  Spouse name: Not on file  ?  Number of children: 3  ? Years of education: 23  ? Highest education level: 12th grade  ?Occupational History  ? Not on file  ?Tobacco Use  ? Smoking status: Never  ? Smokeless tobacco: Never  ?Vaping Use  ? Vaping Use: Never used  ?Substance and Sexual Activity  ? Alcohol use: Yes  ?  Comment: occasionally wine  ? Drug use: No  ? Sexual activity: Not Currently  ?  Birth control/protection: Surgical  ?  Comment: hyst  ?Other Topics Concern  ? Not on file  ?Social History Narrative  ? Not on file  ? ?Social Determinants of Health  ? ?Financial Resource Strain: Not on file  ?Food Insecurity: Not on file  ?Transportation Needs: Not on file  ?Physical Activity: Not on file  ?Stress: Not on file  ?Social Connections: Not on file  ? ? ?Tobacco Counseling ?Counseling given: Not Answered ? ? ?Clinical Intake: ? ?  ? ?  ? ?  ? ?  ? ?  ? ?Diabetic?no ? ?  ? ?  ? ? ?Activities of Daily Living ?In your present state of health, do you have any difficulty performing the following activities: 12/28/2021  ?Hearing? N  ?Vision? N  ?Difficulty concentrating or making decisions? N  ?Walking or climbing stairs? N  ?Dressing or bathing? N  ?Doing errands, shopping? N  ?Some recent data might be hidden  ? ? ?Patient Care Team: ?Fayrene Helper, MD as PCP - General ? ?Indicate any recent Medical Services you may have received from other than [facility] providers in the past year (date may be approximate). ? ?   ?Assessment:  ? This is a routine wellness examination for [name]. ? ?Hearing/Vision screen ?No results found. ? ?Dietary issues and exercise activities discussed: ?  ? ? Goals Addressed   ?None ?  ? ?Depression Screen ?PHQ 2/9 Scores 11/20/2021 10/10/2021 12/28/2020 09/28/2020 09/28/2020 09/25/2020 08/16/2020  ?PHQ - 2 Score 0 0 0 0 0 0 0  ?PHQ- 9 Score - - - '1 1 1 1  '$ ?  ?Fall Risk ?Fall Risk  11/20/2021 10/10/2021 12/28/2020 12/28/2020 09/28/2020  ?Falls in the past year? 0 0 0 0 0  ?Number falls in past yr: 0 0 - 0 0  ?Injury with Fall? 0 0 - 0  0  ?Risk for fall due to : No Fall Risks No Fall Risks - - No Fall Risks  ?Follow up Falls evaluation completed Falls evaluation completed - - Falls evaluation completed  ? ? ?FALL RISK PREVENTION PERTAINING TO THE HOME: ? ?Any stairs in or around the home? Yes  ?If so, are there any without handrails? No  ?Home free of loose throw rugs in walkways, pet beds, electrical cords, etc? Yes  ?Adequate lighting in your home to reduce risk of falls? Yes  ? ?ASSISTIVE  DEVICES UTILIZED TO PREVENT FALLS: ? ?Life alert? No  ?Use of a cane, walker or w/c? No  ?Grab bars in the bathroom? No  ?Shower chair or bench in shower? Yes  ?Elevated toilet seat or a handicapped toilet? No  ? ?TIMED UP AND GO: ? ?Was the test performed? No .  ?Length of time to ambulate 10 feet:  sec.  ? ? ? ?Cognitive Function: ?  ?  ?6CIT Screen 09/28/2020 09/28/2019 09/21/2018  ?What Year? 0 points 0 points 0 points  ?What month? 0 points 0 points 0 points  ?What time? 0 points 0 points 0 points  ?Count back from 20 0 points 0 points 0 points  ?Months in reverse 0 points 0 points 0 points  ?Repeat phrase 0 points 0 points 2 points  ?Total Score 0 0 2  ? ? ?Immunizations ?Immunization History  ?Administered Date(s) Administered  ? Fluad Quad(high Dose 65+) 06/28/2019, 08/16/2020  ? Influenza Split 07/13/2012, 08/13/2016  ? Influenza, High Dose Seasonal PF 09/07/2018  ? Influenza-Unspecified 08/21/2021  ? Moderna SARS-COV2 Booster Vaccination 10/05/2020  ? Moderna Sars-Covid-2 Vaccination 02/05/2020, 03/07/2020  ? Pneumococcal Conjugate-13 03/06/2017  ? Pneumococcal Polysaccharide-23 09/08/2018  ? Td 05/17/2008  ? Zoster Recombinat (Shingrix) 11/22/2021  ? Zoster, Live 07/13/2012  ? ? ?TDAP status: Due, Education has been provided regarding the importance of this vaccine. Advised may receive this vaccine at local pharmacy or Health Dept. Aware to provide a copy of the vaccination record if obtained from local pharmacy or Health Dept. Verbalized acceptance  and understanding. ? ?Flu Vaccine status: Up to date ? ?Pneumococcal vaccine status: Up to date ? ?Covid-19 vaccine status: Information provided on how to obtain vaccines.  ? ?Qualifies for Shingles Vaccine? Yes

## 2022-01-14 NOTE — Patient Instructions (Signed)
?  Ms. Patricelli , ?Thank you for taking time to come for your Medicare Wellness Visit. I appreciate your ongoing commitment to your health goals. Please review the following plan we discussed and let me know if I can assist you in the future.  ? ?These are the goals we discussed: ? Goals   ? ?  Increase physical activity   ?  Patient Stated   ?  Patient states that her goal is to stay healthy  ?  ? ?  ?  ?This is a list of the screening recommended for you and due dates:  ?Health Maintenance  ?Topic Date Due  ? Tetanus Vaccine  05/17/2018  ? COVID-19 Vaccine (3 - Booster for Moderna series) 11/30/2020  ? Zoster (Shingles) Vaccine (2 of 2) 01/17/2022  ? Mammogram  01/12/2024  ? Colon Cancer Screening  01/03/2032  ? Pneumonia Vaccine  Completed  ? Flu Shot  Completed  ? DEXA scan (bone density measurement)  Completed  ? Hepatitis C Screening: USPSTF Recommendation to screen - Ages 59-79 yo.  Completed  ? HPV Vaccine  Aged Out  ?  ?

## 2022-01-15 ENCOUNTER — Other Ambulatory Visit: Payer: Self-pay | Admitting: Family Medicine

## 2022-01-24 ENCOUNTER — Other Ambulatory Visit: Payer: Self-pay | Admitting: Family Medicine

## 2022-02-04 ENCOUNTER — Other Ambulatory Visit: Payer: Self-pay | Admitting: *Deleted

## 2022-02-04 ENCOUNTER — Telehealth: Payer: Self-pay

## 2022-02-04 MED ORDER — GABAPENTIN 300 MG PO CAPS: 300.0000 mg | ORAL_CAPSULE | Freq: Every day | ORAL | 3 refills | Status: DC

## 2022-02-04 NOTE — Telephone Encounter (Signed)
Need med refill ? ?gabapentin (NEURONTIN) 300 MG capsule  ? ?[place] ?

## 2022-02-04 NOTE — Telephone Encounter (Signed)
Medication sent to pharmacy  

## 2022-02-06 ENCOUNTER — Other Ambulatory Visit: Payer: Self-pay | Admitting: Family Medicine

## 2022-02-07 NOTE — Telephone Encounter (Signed)
noted 

## 2022-02-07 NOTE — Telephone Encounter (Signed)
Patient came in office and asked if nurse could please give her a call about her Gabapentin pharmacy told her too early, but she completely out and needs to speak to nurse about it. ?

## 2022-02-07 NOTE — Telephone Encounter (Signed)
It was dispensed for 90 days on 11/26/2021 but she states she is out now an the pharmacy is saying its too early to fill. Please advise  ?

## 2022-02-08 ENCOUNTER — Encounter: Payer: Self-pay | Admitting: Family Medicine

## 2022-02-08 ENCOUNTER — Ambulatory Visit (INDEPENDENT_AMBULATORY_CARE_PROVIDER_SITE_OTHER): Payer: Medicare Other | Admitting: Family Medicine

## 2022-02-08 VITALS — BP 128/78 | HR 83 | Resp 16 | Ht 63.0 in | Wt 159.4 lb

## 2022-02-08 DIAGNOSIS — Z0000 Encounter for general adult medical examination without abnormal findings: Secondary | ICD-10-CM

## 2022-02-08 DIAGNOSIS — E785 Hyperlipidemia, unspecified: Secondary | ICD-10-CM

## 2022-02-08 DIAGNOSIS — I10 Essential (primary) hypertension: Secondary | ICD-10-CM

## 2022-02-08 DIAGNOSIS — R7303 Prediabetes: Secondary | ICD-10-CM

## 2022-02-08 NOTE — Assessment & Plan Note (Signed)

## 2022-02-08 NOTE — Patient Instructions (Addendum)
F/U in September , call if you need me before ? ?Please get fasting lipid, cmp and EGFr, hBA1C 1 week before visit ? ?It is important that you exercise regularly at least 30 minutes 5 times a week. If you develop chest pain, have severe difficulty breathing, or feel very tired, stop exercising immediately and seek medical attention  ? ? ?Think about what you will eat, plan ahead. ?Choose " clean, green, fresh or frozen" over canned, processed or packaged foods which are more sugary, salty and fatty. ?70 to 75% of food eaten should be vegetables and fruit. ?Three meals at set times with snacks allowed between meals, but they must be fruit or vegetables. ?Aim to eat over a 12 hour period , example 7 am to 7 pm, and STOP after  your last meal of the day. ?Drink water,generally about 64 ounces per day, no other drink is as healthy. Fruit juice is best enjoyed in a healthy way, by EATING the fruit. ? ? ?Use benadryl one at bedtime for itching ? ? ?

## 2022-02-08 NOTE — Progress Notes (Deleted)
Does [employer] medicare pay for tdap  ? ?

## 2022-02-08 NOTE — Progress Notes (Signed)
? ? ?  [name]     MRN: [id number]      DOB: [birth year] ? ?HPI: ?Patient is in for annual physical exam. ?No other health concerns are expressed or addressed at the visit. ?Recent labs,  are reviewed. ?Immunization is reviewed , and  updated if needed. ? ? ?PE: ?BP 128/78   Pulse 83   Resp 16   Ht '5\' 3"'$  (1.6 m)   Wt 159 lb 6.4 oz (72.3 kg)   SpO2 95%   BMI 28.24 kg/m?  ? ?Pleasant  female, alert and oriented x 3, in no cardio-pulmonary distress. ?Afebrile. ?HEENT ?No facial trauma or asymetry. Sinuses non tender.  ?Extra occullar muscles intact.Marland Kitchen ?External ears normal, . ?Neck: supple, no adenopathy,JVD or thyromegaly.No bruits. ? ?Chest: ?Clear to ascultation bilaterally.No crackles or wheezes. ?Non tender to palpation ? ?Cardiovascular system; ?Heart sounds normal,  S1 and  S2 ,no S3.  No murmur, or thrill. ?Apical beat not displaced ?Peripheral pulses normal. ? ?Abdomen: ?Soft, non tender,  ? ? ? ? ? ?Musculoskeletal exam: ?Full ROM of spine, hips , shoulders and knees. ?No deformity ,swelling or crepitus noted. ?No muscle wasting or atrophy.  ? ?Neurologic: ?Cranial nerves 2 to 12 intact. ?Power, tone ,sensation and reflexes normal throughout. ?No disturbance in gait. No tremor. ? ?Skin: ?Intact, no ulceration, erythema , scaling or rash noted. ?Pigmentation normal throughout ? ?Psych; ?Normal mood and affect. Judgement and concentration normal ? ? ?Assessment & Plan:  ?Annual physical exam ?Annual exam as documented. ?Counseling done  re healthy lifestyle involving commitment to 150 minutes exercise per week, heart healthy diet, and attaining healthy weight.The importance of adequate sleep also discussed. ?Regular seat belt use and home safety, is also discussed. ?Changes in health habits are decided on by the patient with goals and time frames  set for achieving them. ?Immunization and cancer screening needs are specifically addressed at this visit. ? ? ?

## 2022-02-10 ENCOUNTER — Encounter: Payer: Self-pay | Admitting: Family Medicine

## 2022-02-18 ENCOUNTER — Other Ambulatory Visit: Payer: Self-pay | Admitting: Family Medicine

## 2022-03-05 ENCOUNTER — Ambulatory Visit
Admission: EM | Admit: 2022-03-05 | Discharge: 2022-03-05 | Disposition: A | Discharge status: Home / Self Care | Payer: Medicare Other | Attending: Nurse Practitioner | Admitting: Nurse Practitioner

## 2022-03-05 ENCOUNTER — Encounter: Payer: Self-pay | Admitting: Emergency Medicine

## 2022-03-05 DIAGNOSIS — S80861A Insect bite (nonvenomous), right lower leg, initial encounter: Secondary | ICD-10-CM

## 2022-03-05 DIAGNOSIS — W57XXXA Bitten or stung by nonvenomous insect and other nonvenomous arthropods, initial encounter: Secondary | ICD-10-CM

## 2022-03-05 MED ORDER — MUPIROCIN 2 % EX OINT: 1.0000 "application " | TOPICAL_OINTMENT | Freq: Two times a day (BID) | CUTANEOUS | 0 refills | Status: DC

## 2022-03-05 NOTE — Discharge Instructions (Addendum)
Use medication as prescribed. ?Keep the area clean and dry. ?May apply cool compresses to the affected area to help with pain or swelling. ?Do not rub pick or manipulate the area while symptoms persist. ?Follow-up if you have fever, chills, generalized fatigue, redness, swelling, or foul-smelling drainage to the site.  Follow-up as needed. ?

## 2022-03-05 NOTE — ED Provider Notes (Signed)
?[facility] ? ? ? ?CSN: [id number] ?Arrival date & time: 03/05/22  9024 ? ? ?  ? ?History   ?Chief Complaint ?No chief complaint on file. ? ? ?HPI ?[name] is a 71 y.o. female.  ? ?The patient is a 71 year old female who presents for a possible tick bite to her right lower extremity.  She states she noticed the tick on her leg when she was driving approximately 3 days ago.  She states "it felt like something bit me".  Since that time she has had small induration to the lateral aspect of the right lower extremity above her ankle.  She states the area was swollen, but has since improved.  She denies fever, chills, weakness, malaise, abdominal pain, nausea or vomiting. ? ?The history is provided by the patient.  ? ?Past Medical History:  ?Diagnosis Date  ? Allergy   ? Anxiety   ? 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 GERD (gastroesophageal reflux disease)   ? Glaucoma   ? Hyperlipidemia   ? Osteoarthritis   ? ? ?Patient Active Problem List  ? Diagnosis Date Noted  ? Cervicalgia 09/25/2020  ? Seasonal allergies 02/18/2020  ? Depression, major, single episode, moderate ([place]) 10/29/2019  ? Essential hypertension 01/01/2018  ? Prediabetes 03/30/2015  ? Annual physical exam 04/08/2014  ? Hyperlipemia 06/22/2008  ? GERD (gastroesophageal reflux disease) 05/24/2008  ? Allergic rhinitis 01/08/2008  ? OVERACTIVE BLADDER 04/10/2007  ? GAD (generalized anxiety disorder) 08/22/2006  ? OSTEOARTHRITIS 08/22/2006  ? ? ?Past Surgical History:  ?Procedure Laterality Date  ? COLONOSCOPY WITH PROPOFOL N/A 01/02/2022  ? Procedure: COLONOSCOPY WITH PROPOFOL;  Surgeon: Harvel Quale, MD;  Location: [facility];  Service: Gastroenterology;  Laterality: N/A;  1130  ? cyst removed from right breast    ? LAPAROSCOPIC SALPINGO OOPHERECTOMY Right 04/09/2017  ? Procedure: LAPAROSCOPIC RIGHT SALPINGO OOPHORECTOMY; RIGHT OVARIAN BENIGN CYSTIC TERATOMA;  Surgeon: Florian Buff, MD;  Location: [facility];  Service: Gynecology;   Laterality: Right;  ? PARTIAL HYSTERECTOMY    ? TUBAL LIGATION    ? ? ?OB History   ? ? Gravida  ?3  ? Para  ?3  ? Term  ?3  ? Preterm  ?   ? AB  ?   ? Living  ?3  ?  ? ? SAB  ?   ? IAB  ?   ? Ectopic  ?   ? Multiple  ?   ? Live Births  ?3  ?   ?  ?  ? ? ? ?Home Medications   ? ?Prior to Admission medications   ?Medication Sig Start Date End Date Taking? Authorizing Provider  ?mupirocin ointment (BACTROBAN) 2 % Apply 1 application. topically 2 (two) times daily. 03/05/22  Yes Barnes Florek-Warren, Alda Lea, NP  ?amLODipine (NORVASC) 5 MG tablet TAKE ONE TABLET BY MOUTH ONCE DAILY. 02/18/22   Fayrene Helper, MD  ?Ascorbic Acid (VITAMIN C PO) Take 1 tablet by mouth daily. Power C    [provider]  ?citalopram (CELEXA) 40 MG tablet TAKE ONE TABLET BY MOUTH ONCE DAILY. 01/11/22   Fayrene Helper, MD  ?clotrimazole-betamethasone (LOTRISONE) cream Apply 1 application topically 2 (two) times daily. 11/20/21   Fayrene Helper, MD  ?ELDERBERRY PO Take 2 capsules by mouth daily.    [provider]  ?fluticasone (FLONASE) 50 MCG/ACT nasal spray Place 2 sprays into both nostrils daily. Can do in the morning, or do one spray in  more and one at night. 11/23/21   Fayrene Helper, MD  ?gabapentin (NEURONTIN) 300 MG capsule Take 1 capsule (300 mg total) by mouth at bedtime. 02/04/22   Fayrene Helper, MD  ?loratadine (ALLERGY RELIEF) 10 MG tablet TAKE (1) TABLET BY MOUTH ONCE DAILY AS NEEDED FOR ALLERGIES. 01/25/22   Fayrene Helper, MD  ?Omega-3 Fatty Acids (FISH OIL CONCENTRATE PO) Take 1,576 mg by mouth daily. 788 mg each    [provider]  ?oxybutynin (DITROPAN) 5 MG tablet TAKE (1) TABLET BY MOUTH TWICE DAILY. 01/15/22   Fayrene Helper, MD  ?pantoprazole (PROTONIX) 20 MG tablet TAKE 1 TABLET BY MOUTH ONCE DAILY. 02/06/22   Fayrene Helper, MD  ?rosuvastatin (CRESTOR) 40 MG tablet TAKE 1 TABLET BY MOUTH ONCE DAILY. 10/08/21 12/17/21  Renee Rival, FNP  ?vitamin B-12  (CYANOCOBALAMIN) 1000 MCG tablet Take 1,000 mcg by mouth daily.    [provider]  ?Vitamin E 400 units TABS Take 400 Units by mouth daily.    [provider]  ? ? ?Family History ?Family History  ?Problem Relation Age of Onset  ? Heart disease Mother   ? Heart disease Father   ? Diabetes Father   ? Hypertension Sister   ? Parkinson's disease Brother   ? Heart disease Maternal Grandmother   ? Cancer Paternal Grandmother   ?     breast  ? Diabetes Paternal Grandfather   ? Hypertension Sister   ? Hypertension Sister   ? Down syndrome Son   ? Eczema Son   ? Post-traumatic stress disorder Daughter   ? ? ?Social History ?Social History  ? ?Tobacco Use  ? Smoking status: Never  ? Smokeless tobacco: Never  ?Vaping Use  ? Vaping Use: Never used  ?Substance Use Topics  ? Alcohol use: Not Currently  ?  Comment: occasionally wine  ? Drug use: No  ? ? ? ?Allergies   ?Ibuprofen ? ? ?Review of Systems ?Review of Systems  ?Constitutional: Negative.   ?Skin:   ?     Insect bite to right lower leg  ?Psychiatric/Behavioral: Negative.    ? ? ?Physical Exam ?Triage Vital Signs ?ED Triage Vitals  ?Enc Vitals Group  ?   BP 03/05/22 0937 123/73  ?   Pulse Rate 03/05/22 0937 87  ?   Resp 03/05/22 0937 18  ?   Temp 03/05/22 0937 97.8 ?F (36.6 ?C)  ?   Temp Source 03/05/22 0937 Oral  ?   SpO2 03/05/22 0937 97 %  ?   Weight --   ?   Height --   ?   Head Circumference --   ?   Peak Flow --   ?   Pain Score 03/05/22 0938 3  ?   Pain Loc --   ?   Pain Edu? --   ?   Excl. in [place]? --   ? ?No data found. ? ?Updated Vital Signs ?BP 123/73 (BP Location: Right Arm)   Pulse 87   Temp 97.8 ?F (36.6 ?C) (Oral)   Resp 18   SpO2 97%  ? ?Visual Acuity ?Right Eye Distance:   ?Left Eye Distance:   ?Bilateral Distance:   ? ?Right Eye Near:   ?Left Eye Near:    ?Bilateral Near:    ? ?Physical Exam ?Vitals reviewed.  ?Constitutional:   ?   Appearance: Normal appearance.  ?Cardiovascular:  ?   Rate and Rhythm: Normal rate and regular rhythm.   ?  Pulses: Normal pulses.  ?   Heart sounds: Normal heart sounds.  ?Pulmonary:  ?   Breath sounds: Normal breath sounds.  ?Abdominal:  ?   General: Bowel sounds are normal.  ?   Palpations: Abdomen is soft.  ?Skin: ?   General: Skin is warm and dry.  ?   Capillary Refill: Capillary refill takes less than 2 seconds.  ?   Findings: Wound present.  ?   Comments: Induration to the lateral aspect of the right lower extremity superior to the ankle.  Area with crusting with erythematous base.  Area is tender to palpation.  No warmth or swelling noted to the site.  ?Neurological:  ?   General: No focal deficit present.  ?   Mental Status: She is alert and oriented to person, place, and time.  ?Psychiatric:     ?   Mood and Affect: Mood normal.  ? ? ? ?UC Treatments / Results  ?Labs ?(all labs ordered are listed, but only abnormal results are displayed) ?Labs Reviewed - No data to display ? ?EKG ? ? ?Radiology ?No results found. ? ?Procedures ?Procedures (including critical care time) ? ?Medications Ordered in UC ?Medications - No data to display ? ?Initial Impression / Assessment and Plan / UC Course  ?I have reviewed the triage vital signs and the nursing notes. ? ?Pertinent labs & imaging results that were available during my care of the patient were reviewed by me and considered in my medical decision making (see chart for details). ? ?The patient is a 71 year old female who presents for an insect bite to her right lower extremity.  She noticed the insect on her approximately 2 days ago.  She states since that time she has had redness and swelling to the right lower leg.  Her exam is indicative of the insect bite, but there is no concern for cellulitis at this time.  The area does have some redness around the scabbing of the bite, but there is no warmth.  She also has tenderness to the area surrounding the insect bite.  She reports the area has decreased swelling that she has noticed over the past day.  Will prescribe  mupirocin for her symptoms.  Patient advised to keep the area clean and dry.  Strict return precautions were provided to the patient.  Patient advised to follow-up as needed. ?Final Clinical Impressions(s) / UC D

## 2022-03-05 NOTE — ED Triage Notes (Signed)
Insect bite on Friday to right lower leg.  Pain and swelling to area ?

## 2022-03-06 ENCOUNTER — Other Ambulatory Visit: Payer: Self-pay | Admitting: Family Medicine

## 2022-03-14 ENCOUNTER — Other Ambulatory Visit: Payer: Self-pay | Admitting: Family Medicine

## 2022-03-14 DIAGNOSIS — F4323 Adjustment disorder with mixed anxiety and depressed mood: Secondary | ICD-10-CM

## 2022-03-19 ENCOUNTER — Other Ambulatory Visit: Payer: Self-pay | Admitting: Family Medicine

## 2022-04-03 ENCOUNTER — Other Ambulatory Visit: Payer: Self-pay | Admitting: Family Medicine

## 2022-04-18 ENCOUNTER — Other Ambulatory Visit: Payer: Self-pay | Admitting: Family Medicine

## 2022-04-29 ENCOUNTER — Other Ambulatory Visit: Payer: Self-pay | Admitting: Family Medicine

## 2022-04-29 ENCOUNTER — Other Ambulatory Visit: Payer: Self-pay | Admitting: Nurse Practitioner

## 2022-04-29 DIAGNOSIS — F4323 Adjustment disorder with mixed anxiety and depressed mood: Secondary | ICD-10-CM

## 2022-05-10 ENCOUNTER — Other Ambulatory Visit: Payer: Self-pay | Admitting: Family Medicine

## 2022-05-15 ENCOUNTER — Other Ambulatory Visit: Payer: Self-pay | Admitting: Family Medicine

## 2022-05-18 ENCOUNTER — Other Ambulatory Visit: Payer: Self-pay | Admitting: Family Medicine

## 2022-05-29 ENCOUNTER — Other Ambulatory Visit: Payer: Self-pay | Admitting: Family Medicine

## 2022-06-04 ENCOUNTER — Telehealth: Payer: Self-pay | Admitting: Family Medicine

## 2022-06-04 NOTE — Telephone Encounter (Signed)
Pt called asking to speak to nurse in regards to the change in medication. Pt states she has been taking Furosemide think it was supposed to be her acid reflux medication. She was on pantoprazole (PROTONIX) 20 MG tablet . Please contact patient ASAP she is trying to find out what is going on and why she is feeling so bad.

## 2022-06-05 ENCOUNTER — Other Ambulatory Visit: Payer: Self-pay

## 2022-06-05 MED ORDER — PANTOPRAZOLE SODIUM 20 MG PO TBEC: 20.0000 mg | DELAYED_RELEASE_TABLET | Freq: Every day | ORAL | 3 refills | Status: DC

## 2022-06-05 NOTE — Telephone Encounter (Signed)
Spoke to patient protonix sent to [facility]

## 2022-06-05 NOTE — Telephone Encounter (Signed)
Patient calling back and said she is needing her acid reflux medicine pantoprazole (PROTONIX) 20 MG tablet patient does not need furosmide (lasix 20 mg ) water pills that was called in. Patient says it gives her the diarrhea and dizzy spells. Never has taken lasix before.  Please have nurse give patient a call back [phone].   Pharmacy:  [facility]

## 2022-06-18 ENCOUNTER — Other Ambulatory Visit: Payer: Self-pay | Admitting: Family Medicine

## 2022-06-20 ENCOUNTER — Other Ambulatory Visit: Payer: Self-pay | Admitting: Family Medicine

## 2022-06-20 DIAGNOSIS — F4323 Adjustment disorder with mixed anxiety and depressed mood: Secondary | ICD-10-CM

## 2022-07-10 ENCOUNTER — Encounter: Payer: Self-pay | Admitting: Family Medicine

## 2022-07-10 ENCOUNTER — Ambulatory Visit: Payer: Medicare Other | Admitting: Family Medicine

## 2022-07-17 ENCOUNTER — Other Ambulatory Visit: Payer: Self-pay | Admitting: Family Medicine

## 2022-07-26 ENCOUNTER — Ambulatory Visit (INDEPENDENT_AMBULATORY_CARE_PROVIDER_SITE_OTHER): Payer: Medicare Other | Admitting: Family Medicine

## 2022-07-26 ENCOUNTER — Encounter: Payer: Self-pay | Admitting: Family Medicine

## 2022-07-26 VITALS — BP 112/68 | HR 70 | Ht 63.0 in | Wt 158.0 lb

## 2022-07-26 DIAGNOSIS — E785 Hyperlipidemia, unspecified: Secondary | ICD-10-CM | POA: Diagnosis not present

## 2022-07-26 DIAGNOSIS — I10 Essential (primary) hypertension: Secondary | ICD-10-CM

## 2022-07-26 DIAGNOSIS — Z23 Encounter for immunization: Secondary | ICD-10-CM | POA: Diagnosis not present

## 2022-07-26 DIAGNOSIS — R7303 Prediabetes: Secondary | ICD-10-CM

## 2022-07-26 DIAGNOSIS — F411 Generalized anxiety disorder: Secondary | ICD-10-CM | POA: Diagnosis not present

## 2022-07-26 DIAGNOSIS — F324 Major depressive disorder, single episode, in partial remission: Secondary | ICD-10-CM | POA: Diagnosis not present

## 2022-07-26 NOTE — Patient Instructions (Addendum)
Annual exam 4/15 or after, call if you need me sooner  Flu vaccine today Need COVID VACCINE AND tdap AT [place] 02/08/2022  It is important that you exercise regularly at least 30 minutes 5 times a week. If you develop chest pain, have severe difficulty breathing, or feel very tired, stop exercising immediately and seek medical attention  Think about what you will eat, plan ahead. Choose " clean, green, fresh or frozen" over canned, processed or packaged foods which are more sugary, salty and fatty. 70 to 75% of food eaten should be vegetables and fruit. Three meals at set times with snacks allowed between meals, but they must be fruit or vegetables. Aim to eat over a 12 hour period , example 7 am to 7 pm, and STOP after  your last meal of the day. Drink water,generally about 64 ounces per day, no other drink is as healthy. Fruit juice is best enjoyed in a healthy way, by EATING the fruit. Thanks for choosing [facility], we consider it a privelige to serve you.

## 2022-07-28 ENCOUNTER — Encounter: Payer: Self-pay | Admitting: Family Medicine

## 2022-07-28 LAB — CMP14+EGFR
ALT: 31 IU/L (ref 0–32)
AST: 34 IU/L (ref 0–40)
Albumin/Globulin Ratio: 1.4 (ref 1.2–2.2)
Albumin: 4.7 g/dL (ref 3.9–4.9)
Alkaline Phosphatase: 60 IU/L (ref 44–121)
BUN/Creatinine Ratio: 11 — ABNORMAL LOW (ref 12–28)
BUN: 9 mg/dL (ref 8–27)
Bilirubin Total: 0.3 mg/dL (ref 0.0–1.2)
CO2: 27 mmol/L (ref 20–29)
Calcium: 10.2 mg/dL (ref 8.7–10.3)
Chloride: 101 mmol/L (ref 96–106)
Creatinine, Ser: 0.83 mg/dL (ref 0.57–1.00)
Globulin, Total: 3.3 g/dL (ref 1.5–4.5)
Glucose: 89 mg/dL (ref 70–99)
Potassium: 4.4 mmol/L (ref 3.5–5.2)
Sodium: 139 mmol/L (ref 134–144)
Total Protein: 8 g/dL (ref 6.0–8.5)
eGFR: 76 mL/min/{1.73_m2} (ref >59)

## 2022-07-28 LAB — HEMOGLOBIN A1C
Est. average glucose Bld gHb Est-mCnc: 117 mg/dL
Hgb A1c MFr Bld: 5.7 % — ABNORMAL HIGH (ref 4.8–5.6)

## 2022-07-28 LAB — LIPID PANEL
Chol/HDL Ratio: 2.6 ratio (ref 0.0–4.4)
Cholesterol, Total: 202 mg/dL — ABNORMAL HIGH (ref 100–199)
HDL: 79 mg/dL (ref >39)
LDL Chol Calc (NIH): 102 mg/dL — ABNORMAL HIGH (ref 0–99)
Triglycerides: 122 mg/dL (ref 0–149)
VLDL Cholesterol Cal: 21 mg/dL (ref 5–40)

## 2022-07-28 NOTE — Assessment & Plan Note (Signed)
Controlled, no change in medication DASH diet and commitment to daily physical activity for a minimum of 30 minutes discussed and encouraged, as a part of hypertension management. The importance of attaining a healthy weight is also discussed.     07/26/2022    1:47 PM 07/26/2022    1:44 PM 03/05/2022    9:37 AM 02/08/2022    9:29 AM 01/02/2022   12:43 PM 01/02/2022    9:59 AM 12/28/2021    8:39 AM  BP/Weight  Systolic BP 798 102 548 628 241 753   Diastolic BP 68 82 73 78 64 88   Wt. (Lbs)  158  159.4   165  BMI  27.99 kg/m2  28.24 kg/m2   29.23 kg/m2

## 2022-07-28 NOTE — Assessment & Plan Note (Signed)
Controlled, no change in medication  

## 2022-07-28 NOTE — Assessment & Plan Note (Signed)
Hyperlipidemia:Low fat diet discussed and encouraged.   Lipid Panel  Lab Results  Component Value Date   CHOL 202 (H) 07/26/2022   HDL 79 07/26/2022   LDLCALC 102 (H) 07/26/2022   TRIG 122 07/26/2022   CHOLHDL 2.6 07/26/2022     Needs to reduce fat in diet, no med change

## 2022-07-28 NOTE — Assessment & Plan Note (Signed)
Patient educated about the importance of limiting  Carbohydrate intake , the need to commit to daily physical activity for a minimum of 30 minutes , and to commit weight loss. The fact that changes in all these areas will reduce or eliminate all together the development of diabetes is stressed.      Latest Ref Rng & Units 07/26/2022    2:41 PM 11/20/2021    8:58 AM 02/25/2021    5:16 PM 08/16/2020   11:55 AM 04/04/2017    2:13 PM  Diabetic Labs  HbA1c 4.8 - 5.6 % 5.7  6.1   5.8    Chol 100 - 199 mg/dL 202  179   246    HDL >39 mg/dL 79  76   70    Calc LDL 0 - 99 mg/dL 102  88   151    Triglycerides 0 - 149 mg/dL 122  85   129    Creatinine 0.57 - 1.00 mg/dL 0.83  0.87  0.81  0.91  0.90       07/26/2022    1:47 PM 07/26/2022    1:44 PM 03/05/2022    9:37 AM 02/08/2022    9:29 AM 01/02/2022   12:43 PM 01/02/2022    9:59 AM 12/28/2021    8:39 AM  BP/Weight  Systolic BP 511 021 117 356 701 410   Diastolic BP 68 82 73 78 64 88   Wt. (Lbs)  158  159.4   165  BMI  27.99 kg/m2  28.24 kg/m2   29.23 kg/m2       No data to display          unchanged

## 2022-07-28 NOTE — Progress Notes (Signed)
[name]     MRN: [id number]      DOB: 27-Mar-1951   HPI [name] is here for follow up and re-evaluation of chronic medical conditions, medication management and review of any available recent lab and radiology data.  Preventive health is updated, specifically  Cancer screening and Immunization.   Questions or concerns regarding consultations or procedures which the PT has had in the interim are  addressed. The PT denies any adverse reactions to current medications since the last visit.  There are no new concerns.  There are no specific complaints   ROS Denies recent fever or chills. Denies sinus pressure, nasal congestion, ear pain or sore throat. Denies chest congestion, productive cough or wheezing. Denies chest pains, palpitations and leg swelling Denies abdominal pain, nausea, vomiting,diarrhea or constipation.   Denies dysuria, frequency, hesitancy or incontinence. Denies joint pain, swelling and limitation in mobility. Denies headaches, seizures, numbness, or tingling. Denies depression, anxiety or insomnia. Denies skin break down or rash.   PE  BP 112/68 (BP Location: Right Arm, Cuff Size: Normal)   Pulse 70   Ht '5\' 3"'$  (1.6 m)   Wt 158 lb (71.7 kg)   SpO2 95%   BMI 27.99 kg/m   Patient alert and oriented and in no cardiopulmonary distress.  HEENT: No facial asymmetry, EOMI,     Neck supple .  Chest: Clear to auscultation bilaterally.  CVS: S1, S2 no murmurs, no S3.Regular rate.  ABD: Soft non tender.   Ext: No edema  MS: Adequate ROM spine, shoulders, hips and knees.  Skin: Intact, no ulcerations or rash noted.  Psych: Good eye contact, normal affect. Memory intact not anxious or depressed appearing.  CNS: CN 2-12 intact, power,  normal throughout.no focal deficits noted.   Assessment & Plan  Essential hypertension Controlled, no change in medication DASH diet and commitment to daily physical activity for a minimum of 30 minutes discussed and  encouraged, as a part of hypertension management. The importance of attaining a healthy weight is also discussed.     07/26/2022    1:47 PM 07/26/2022    1:44 PM 03/05/2022    9:37 AM 02/08/2022    9:29 AM 01/02/2022   12:43 PM 01/02/2022    9:59 AM 12/28/2021    8:39 AM  BP/Weight  Systolic BP 381 829 937 169 678 938   Diastolic BP 68 82 73 78 64 88   Wt. (Lbs)  158  159.4   165  BMI  27.99 kg/m2  28.24 kg/m2   29.23 kg/m2       Depression, major, single episode, in partial remission (HCC) Controlled, no change in medication   GAD (generalized anxiety disorder) Controlled, no change in medication   Hyperlipemia Hyperlipidemia:Low fat diet discussed and encouraged.   Lipid Panel  Lab Results  Component Value Date   CHOL 202 (H) 07/26/2022   HDL 79 07/26/2022   LDLCALC 102 (H) 07/26/2022   TRIG 122 07/26/2022   CHOLHDL 2.6 07/26/2022     Needs to reduce fat in diet, no med change  Prediabetes Patient educated about the importance of limiting  Carbohydrate intake , the need to commit to daily physical activity for a minimum of 30 minutes , and to commit weight loss. The fact that changes in all these areas will reduce or eliminate all together the development of diabetes is stressed.      Latest Ref Rng & Units 07/26/2022    2:41 PM  11/20/2021    8:58 AM 02/25/2021    5:16 PM 08/16/2020   11:55 AM 04/04/2017    2:13 PM  Diabetic Labs  HbA1c 4.8 - 5.6 % 5.7  6.1   5.8    Chol 100 - 199 mg/dL 202  179   246    HDL >39 mg/dL 79  76   70    Calc LDL 0 - 99 mg/dL 102  88   151    Triglycerides 0 - 149 mg/dL 122  85   129    Creatinine 0.57 - 1.00 mg/dL 0.83  0.87  0.81  0.91  0.90       07/26/2022    1:47 PM 07/26/2022    1:44 PM 03/05/2022    9:37 AM 02/08/2022    9:29 AM 01/02/2022   12:43 PM 01/02/2022    9:59 AM 12/28/2021    8:39 AM  BP/Weight  Systolic BP 414 239 532 023 343 568   Diastolic BP 68 82 73 78 64 88   Wt. (Lbs)  158  159.4   165  BMI  27.99 kg/m2  28.24  kg/m2   29.23 kg/m2       No data to display          unchanged

## 2022-08-06 DIAGNOSIS — H52223 Regular astigmatism, bilateral: Secondary | ICD-10-CM | POA: Diagnosis not present

## 2022-08-14 ENCOUNTER — Other Ambulatory Visit: Payer: Self-pay | Admitting: Nurse Practitioner

## 2022-08-14 ENCOUNTER — Other Ambulatory Visit: Payer: Self-pay | Admitting: Family Medicine

## 2022-08-14 DIAGNOSIS — F4323 Adjustment disorder with mixed anxiety and depressed mood: Secondary | ICD-10-CM

## 2022-08-15 ENCOUNTER — Other Ambulatory Visit: Payer: Self-pay | Admitting: Family Medicine

## 2022-08-30 DIAGNOSIS — H524 Presbyopia: Secondary | ICD-10-CM | POA: Diagnosis not present

## 2022-09-07 ENCOUNTER — Encounter (INDEPENDENT_AMBULATORY_CARE_PROVIDER_SITE_OTHER): Payer: Self-pay | Admitting: Gastroenterology

## 2022-09-10 ENCOUNTER — Encounter: Payer: Self-pay | Admitting: Internal Medicine

## 2022-09-10 ENCOUNTER — Ambulatory Visit (INDEPENDENT_AMBULATORY_CARE_PROVIDER_SITE_OTHER): Payer: Medicare Other | Admitting: Internal Medicine

## 2022-09-10 VITALS — BP 142/78 | HR 80 | Ht 66.0 in | Wt 159.8 lb

## 2022-09-10 DIAGNOSIS — M25551 Pain in right hip: Secondary | ICD-10-CM

## 2022-09-10 MED ORDER — MELOXICAM 7.5 MG PO TABS: 7.5000 mg | ORAL_TABLET | Freq: Every day | ORAL | 0 refills | Status: DC

## 2022-09-10 NOTE — Patient Instructions (Signed)
It was a pleasure to see you today.  Thank you for giving [place] the opportunity to be involved in your care.  Below is a brief recap of your visit and next steps.  We will plan to see you again in 2 weeks.  Summary We will check right hip xrays today I have prescribed meloxicam for pain relief We will notify you of xray results

## 2022-09-10 NOTE — Progress Notes (Signed)
   Acute Office Visit  Subjective:     Patient ID: [name], female    DOB: 28-Sep-1951, 71 y.o.   MRN: [id number]  Chief Complaint  Patient presents with   Leg Pain    Right leg pain started 08/26/22.    [name] presents today for evaluation of right lower extremity pain.  She endorses a 1 month history of right anterior hip pain that radiates to her groin.  There is no inciting event or trauma at the onset of pain.  Pain is worse with ambulation.  She has tried taking Tylenol for symptom relief without sustained improvement.  She denies numbness or weakness in the right lower extremity.  She is not currently experiencing pain in her right knee or ankle.  She has no prior history of injury to the right lower extremity.  Review of Systems  Musculoskeletal:  Positive for joint pain (Right anterior hip pain).     Objective:    BP (!) 142/78   Pulse 80   Ht '5\' 6"'$  (1.676 m)   Wt 159 lb 12.8 oz (72.5 kg)   SpO2 96%   BMI 25.79 kg/m   Physical Exam Musculoskeletal:     Comments: ROM of the right hip is reduced in all directions secondary to pain There is no tenderness to palpation over the anterior, lateral, or posterior aspects of the right hip Strength and sensation generally intact in the right lower extremity Positive logroll and FABER tests         Assessment & Plan:   Problem List Items Addressed This Visit       Right hip pain - Primary    Presenting today for evaluation of anterior right hip pain that has been present x1 month.  Pain radiates to her groin.  There was no trauma or injury at the onset of pain.  She has been taking Tylenol without sustained pain relief.  Positive logroll and FABER test noted on exam.  Her symptoms and exam findings today are most concerning for hip joint pathology.  I have ordered x-rays today as part of her initial work-up.  I have also prescribed meloxicam for pain relief.  She will return to care in 2 weeks for reassessment.       Meds ordered this encounter  Medications   meloxicam (MOBIC) 7.5 MG tablet    Sig: Take 1 tablet (7.5 mg total) by mouth daily.    Dispense:  30 tablet    Refill:  0    Return in about 2 weeks (around 09/24/2022).  Johnette Abraham, MD

## 2022-09-11 ENCOUNTER — Other Ambulatory Visit: Payer: Self-pay | Admitting: Internal Medicine

## 2022-09-11 ENCOUNTER — Ambulatory Visit (HOSPITAL_COMMUNITY)
Admission: RE | Admit: 2022-09-11 | Discharge: 2022-09-11 | Disposition: A | Discharge status: Home / Self Care | Payer: Medicare Other | Source: Ambulatory Visit | Attending: Internal Medicine | Admitting: Internal Medicine

## 2022-09-11 DIAGNOSIS — M25551 Pain in right hip: Secondary | ICD-10-CM | POA: Diagnosis not present

## 2022-09-12 ENCOUNTER — Other Ambulatory Visit: Payer: Self-pay | Admitting: Family Medicine

## 2022-09-15 NOTE — Assessment & Plan Note (Addendum)
Presenting today for evaluation of anterior right hip pain that has been present x1 month.  Pain radiates to her groin.  There was no trauma or injury at the onset of pain.  She has been taking Tylenol without sustained pain relief.  Positive logroll and FABER test noted on exam.  Her symptoms and exam findings today are most concerning for hip joint pathology.  I have ordered x-rays today as part of her initial work-up.  I have also prescribed meloxicam for pain relief.  She will return to care in 2 weeks for reassessment.

## 2022-09-17 ENCOUNTER — Other Ambulatory Visit: Payer: Self-pay | Admitting: Family Medicine

## 2022-09-24 ENCOUNTER — Ambulatory Visit (INDEPENDENT_AMBULATORY_CARE_PROVIDER_SITE_OTHER): Payer: Medicare Other | Admitting: Internal Medicine

## 2022-09-24 ENCOUNTER — Encounter: Payer: Self-pay | Admitting: Internal Medicine

## 2022-09-24 ENCOUNTER — Telehealth: Payer: Self-pay | Admitting: Family Medicine

## 2022-09-24 ENCOUNTER — Other Ambulatory Visit: Payer: Self-pay

## 2022-09-24 VITALS — BP 138/80 | HR 92 | Ht 63.0 in | Wt 163.2 lb

## 2022-09-24 DIAGNOSIS — F4323 Adjustment disorder with mixed anxiety and depressed mood: Secondary | ICD-10-CM

## 2022-09-24 DIAGNOSIS — M25551 Pain in right hip: Secondary | ICD-10-CM

## 2022-09-24 DIAGNOSIS — M160 Bilateral primary osteoarthritis of hip: Secondary | ICD-10-CM

## 2022-09-24 MED ORDER — CITALOPRAM HYDROBROMIDE 40 MG PO TABS: 40.0000 mg | ORAL_TABLET | Freq: Every day | ORAL | 3 refills | Status: DC

## 2022-09-24 MED ORDER — PANTOPRAZOLE SODIUM 20 MG PO TBEC: 20.0000 mg | DELAYED_RELEASE_TABLET | Freq: Every day | ORAL | 3 refills | Status: DC

## 2022-09-24 MED ORDER — MELOXICAM 7.5 MG PO TABS: 7.5000 mg | ORAL_TABLET | Freq: Every day | ORAL | 0 refills | Status: DC | PRN

## 2022-09-24 NOTE — Telephone Encounter (Signed)
Need med refills  pantoprazole (PROTONIX) 20 MG tablet [[id number]]   citalopram (CELEXA) 40 MG tablet [[id number]]   Pharmacy  [facility] - [place], [address], [place] [address] Phone: [phone]  Fax: [phone]

## 2022-09-24 NOTE — Patient Instructions (Signed)
It was a pleasure to see you today.  Thank you for giving [place] the opportunity to be involved in your care.  Below is a brief recap of your visit and next steps.  We will plan to see you again in April.  Summary I have placed a referral to physical therapy today for hip arthritis You can continue to use meloxicam as needed for severe pain

## 2022-09-24 NOTE — Assessment & Plan Note (Signed)
She was initially evaluated by me on 11/14 for right anterior hip pain.  Positive logroll and FABER test noted on exam.  X-rays were obtained, which revealed mild bilateral femoroacetabular osteoarthritis.  Her pain has somewhat improved with use of meloxicam.  We discussed treatment options today.  I offered a referral to physical therapy and to orthopedic surgery.  For now, she would like to try physical therapy and continue use of Tylenol and meloxicam as needed for pain relief.  We will defer referral to orthopedic surgery for now. -Follow-up as needed, otherwise she has previously scheduled follow-up with [name] in April 2024.

## 2022-09-24 NOTE — Progress Notes (Signed)
Established Patient Office Visit  Subjective   Patient ID: [name], female    DOB: [birth year]  Age: 71 y.o. MRN: [id number]  Chief Complaint  Patient presents with   Follow-up   [name] returns to care today.  She was last seen by me 2 weeks ago for an acute visit to discuss a 1 month history of right anterior hip pain.  I prescribed meloxicam in order x-rays.  2-week follow-up was arranged.  There have been no acute interval events.  Today [name] reports that her pain is still present but has improved with use of meloxicam.  Her pain is worse with extended periods of standing or ambulation.  There has been no change in the location or pattern of her pain.  She denies additional symptoms currently.  Past Medical History:  Diagnosis Date   Allergy    Anxiety    Back pain with radiation 07/13/2012   GERD (gastroesophageal reflux disease)    Glaucoma    Hyperlipidemia    Osteoarthritis    Past Surgical History:  Procedure Laterality Date   COLONOSCOPY WITH PROPOFOL N/A 01/02/2022   Procedure: COLONOSCOPY WITH PROPOFOL;  Surgeon: Harvel Quale, MD;  Location: [facility];  Service: Gastroenterology;  Laterality: N/A;  1130   cyst removed from right breast     LAPAROSCOPIC SALPINGO OOPHERECTOMY Right 04/09/2017   Procedure: LAPAROSCOPIC RIGHT SALPINGO OOPHORECTOMY; RIGHT OVARIAN BENIGN CYSTIC TERATOMA;  Surgeon: Florian Buff, MD;  Location: [facility];  Service: Gynecology;  Laterality: Right;   PARTIAL HYSTERECTOMY     TUBAL LIGATION     Social History   Tobacco Use   Smoking status: Never   Smokeless tobacco: Never  Vaping Use   Vaping Use: Never used  Substance Use Topics   Alcohol use: Not Currently    Comment: occasionally wine   Drug use: No   Family History  Problem Relation Age of Onset   Heart disease Mother    Heart disease Father    Diabetes Father    Hypertension Sister    Parkinson's disease Brother    Heart disease Maternal Grandmother     Cancer Paternal Grandmother        breast   Diabetes Paternal Grandfather    Hypertension Sister    Hypertension Sister    Down syndrome Son    Eczema Son    Post-traumatic stress disorder Daughter    Allergies  Allergen Reactions   Ibuprofen Palpitations and Other (See Comments)    Rapid heart rate.   Review of Systems  Musculoskeletal:  Positive for joint pain (Right anterior hip).  All other systems reviewed and are negative.    Objective:     BP 138/80   Pulse 92   Ht '5\' 3"'$  (1.6 m)   Wt 163 lb 3.2 oz (74 kg)   SpO2 94%   BMI 28.91 kg/m   Physical Exam Musculoskeletal:        General: No tenderness or deformity.     Right lower leg: No edema.     Left lower leg: No edema.     Comments: ROM of the right hip remains generally reduced in all directions There is no tenderness palpation of the anterior, lateral, or posterior aspects of the right hip Strength is intact in the right lower extremity Positive logroll/FABER tests      Assessment & Plan:   Problem List Items Addressed This Visit  Osteoarthritis of hips, bilateral    She was initially evaluated by me on 11/14 for right anterior hip pain.  Positive logroll and FABER test noted on exam.  X-rays were obtained, which revealed mild bilateral femoroacetabular osteoarthritis.  Her pain has somewhat improved with use of meloxicam.  We discussed treatment options today.  I offered a referral to physical therapy and to orthopedic surgery.  For now, she would like to try physical therapy and continue use of Tylenol and meloxicam as needed for pain relief.  We will defer referral to orthopedic surgery for now. -Follow-up as needed, otherwise she has previously scheduled follow-up with [name] in April 2024.      Return if symptoms worsen or fail to improve.    Johnette Abraham, MD

## 2022-09-24 NOTE — Telephone Encounter (Signed)
Meds refilled.

## 2022-10-15 ENCOUNTER — Other Ambulatory Visit: Payer: Self-pay | Admitting: Internal Medicine

## 2022-10-15 ENCOUNTER — Other Ambulatory Visit: Payer: Self-pay | Admitting: Family Medicine

## 2022-10-15 DIAGNOSIS — M25551 Pain in right hip: Secondary | ICD-10-CM
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
## 2022-11-05 ENCOUNTER — Ambulatory Visit (HOSPITAL_COMMUNITY): Payer: Medicare Other | Attending: Internal Medicine

## 2022-11-05 DIAGNOSIS — R262 Difficulty in walking, not elsewhere classified: Secondary | ICD-10-CM | POA: Insufficient documentation

## 2022-11-05 DIAGNOSIS — M160 Bilateral primary osteoarthritis of hip: Secondary | ICD-10-CM | POA: Diagnosis not present

## 2022-11-05 DIAGNOSIS — R2689 Other abnormalities of gait and mobility: Secondary | ICD-10-CM | POA: Diagnosis not present

## 2022-11-05 DIAGNOSIS — M25551 Pain in right hip: Secondary | ICD-10-CM | POA: Insufficient documentation

## 2022-11-07 ENCOUNTER — Ambulatory Visit (HOSPITAL_COMMUNITY): Payer: Medicare Other

## 2022-11-07 DIAGNOSIS — R262 Difficulty in walking, not elsewhere classified: Secondary | ICD-10-CM | POA: Diagnosis not present

## 2022-11-07 DIAGNOSIS — M160 Bilateral primary osteoarthritis of hip: Secondary | ICD-10-CM

## 2022-11-07 DIAGNOSIS — M25551 Pain in right hip: Secondary | ICD-10-CM

## 2022-11-07 DIAGNOSIS — R2689 Other abnormalities of gait and mobility: Secondary | ICD-10-CM | POA: Diagnosis not present
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
## 2022-11-13 ENCOUNTER — Ambulatory Visit (HOSPITAL_COMMUNITY): Payer: Medicare Other | Admitting: Physical Therapy

## 2022-11-13 ENCOUNTER — Encounter (HOSPITAL_COMMUNITY): Payer: Self-pay | Admitting: Physical Therapy

## 2022-11-13 DIAGNOSIS — R262 Difficulty in walking, not elsewhere classified: Secondary | ICD-10-CM

## 2022-11-13 DIAGNOSIS — M25551 Pain in right hip: Secondary | ICD-10-CM

## 2022-11-13 DIAGNOSIS — M160 Bilateral primary osteoarthritis of hip: Secondary | ICD-10-CM | POA: Diagnosis not present

## 2022-11-13 DIAGNOSIS — R2689 Other abnormalities of gait and mobility: Secondary | ICD-10-CM | POA: Diagnosis not present
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
## 2022-11-14 ENCOUNTER — Other Ambulatory Visit: Payer: Self-pay | Admitting: Family Medicine

## 2022-11-15 ENCOUNTER — Ambulatory Visit (HOSPITAL_COMMUNITY): Payer: Medicare Other

## 2022-11-15 DIAGNOSIS — R2689 Other abnormalities of gait and mobility: Secondary | ICD-10-CM | POA: Diagnosis not present

## 2022-11-15 DIAGNOSIS — M25551 Pain in right hip: Secondary | ICD-10-CM

## 2022-11-15 DIAGNOSIS — R262 Difficulty in walking, not elsewhere classified: Secondary | ICD-10-CM

## 2022-11-15 DIAGNOSIS — M160 Bilateral primary osteoarthritis of hip: Secondary | ICD-10-CM | POA: Diagnosis not present
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
## 2022-11-19 ENCOUNTER — Telehealth: Payer: Self-pay | Admitting: Family Medicine

## 2022-11-19 ENCOUNTER — Ambulatory Visit (HOSPITAL_COMMUNITY): Payer: Medicare Other

## 2022-11-19 ENCOUNTER — Other Ambulatory Visit: Payer: Self-pay

## 2022-11-19 ENCOUNTER — Other Ambulatory Visit: Payer: Self-pay | Admitting: Family Medicine

## 2022-11-19 DIAGNOSIS — R2689 Other abnormalities of gait and mobility: Secondary | ICD-10-CM | POA: Diagnosis not present

## 2022-11-19 DIAGNOSIS — M160 Bilateral primary osteoarthritis of hip: Secondary | ICD-10-CM

## 2022-11-19 DIAGNOSIS — M25551 Pain in right hip: Secondary | ICD-10-CM

## 2022-11-19 DIAGNOSIS — R262 Difficulty in walking, not elsewhere classified: Secondary | ICD-10-CM | POA: Diagnosis not present

## 2022-11-19 MED ORDER — AMLODIPINE BESYLATE 5 MG PO TABS: 5.0000 mg | ORAL_TABLET | Freq: Every day | ORAL | 2 refills | Status: DC
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
## 2022-11-19 NOTE — Telephone Encounter (Signed)
Refills sent

## 2022-11-19 NOTE — Telephone Encounter (Signed)
Prescription Request  11/19/2022  Is this a "Controlled Substance" medicine? No  LOV: 07/26/2022  What is the name of the medication or equipment? amLODipine (NORVASC) 5 MG tablet   Have you contacted your pharmacy to request a refill? Yes   Which pharmacy would you like this sent to?  [facility], [place] - [place] [place] [address] Phone: [phone] Fax: [place], [place] [address] Phone: [phone] Fax: [phone]    Patient notified that their request is being sent to the clinical staff for review and that they should receive a response within 2 business days.   Please advise at [place]

## 2022-11-21 ENCOUNTER — Encounter (HOSPITAL_COMMUNITY): Payer: BLUE CROSS/BLUE SHIELD

## 2022-11-26 ENCOUNTER — Encounter (HOSPITAL_COMMUNITY): Payer: BLUE CROSS/BLUE SHIELD

## 2022-11-26 ENCOUNTER — Other Ambulatory Visit: Payer: Self-pay | Admitting: Family Medicine

## 2022-11-26 ENCOUNTER — Other Ambulatory Visit: Payer: Self-pay | Admitting: Internal Medicine

## 2022-11-26 DIAGNOSIS — M25551 Pain in right hip: Secondary | ICD-10-CM

## 2022-11-28 ENCOUNTER — Other Ambulatory Visit: Payer: Self-pay | Admitting: Internal Medicine

## 2022-11-28 DIAGNOSIS — M25551 Pain in right hip: Secondary | ICD-10-CM

## 2022-12-03 ENCOUNTER — Encounter (HOSPITAL_COMMUNITY): Payer: Self-pay

## 2022-12-06 ENCOUNTER — Ambulatory Visit (HOSPITAL_COMMUNITY): Payer: Medicare Other | Attending: Internal Medicine

## 2022-12-06 DIAGNOSIS — R2689 Other abnormalities of gait and mobility: Secondary | ICD-10-CM | POA: Insufficient documentation

## 2022-12-06 DIAGNOSIS — R262 Difficulty in walking, not elsewhere classified: Secondary | ICD-10-CM | POA: Diagnosis not present

## 2022-12-06 DIAGNOSIS — M25551 Pain in right hip: Secondary | ICD-10-CM | POA: Diagnosis not present

## 2022-12-06 DIAGNOSIS — M160 Bilateral primary osteoarthritis of hip: Secondary | ICD-10-CM | POA: Diagnosis not present
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
## 2022-12-06 NOTE — Addendum Note (Signed)
Addended byKarn Cassis S on: 12/06/2022 08:21 AM   Modules accepted: Orders

## 2022-12-25 ENCOUNTER — Ambulatory Visit (INDEPENDENT_AMBULATORY_CARE_PROVIDER_SITE_OTHER): Payer: Medicare Other | Admitting: Internal Medicine

## 2022-12-25 ENCOUNTER — Encounter: Payer: Self-pay | Admitting: Internal Medicine

## 2022-12-25 VITALS — BP 155/82 | HR 80 | Ht 64.5 in | Wt 166.4 lb

## 2022-12-25 DIAGNOSIS — M160 Bilateral primary osteoarthritis of hip: Secondary | ICD-10-CM

## 2022-12-25 NOTE — Assessment & Plan Note (Signed)
Presenting today for an acute visit to discuss persistent bilateral hip pain (R>L).  X-rays from November 2023 demonstrated mild bilateral femoral acetabular osteoarthritis.  She has been treated with conservative measures (NSAIDs and physical therapy) but her pain has not improved.  She has requested referral to orthopedic surgery today. -Orthopedic surgery referral placed today -She will return to care for routine follow-up with [name] in April

## 2022-12-25 NOTE — Progress Notes (Signed)
   Acute Office Visit  Subjective:     Patient ID: [name], female    DOB: [birth year], 72 y.o.   MRN: [id number]  Chief Complaint  Patient presents with   Leg Injury    Right leg pain started again 12/16/2022. Completed therapy and pain seemed to get better but started back again   [name] returns to care today for an acute visit to discuss persistent bilateral hip pain (R>L).  She was previously evaluated by me in November for anterior right hip pain present for 1 month.  X-rays obtained at that time demonstrated mild bilateral femoral acetabular osteoarthritis.  She has initially been treated with conservative measures including NSAIDs for pain control and a referral to physical therapy.  [name] recently completed physical therapy, which she feels was beneficial, but her pain has not significantly improved.  We had previously discussed a referral to orthopedic surgery for evaluation but she had wanted to deferred the referral in the past.  Today she states that she is interested in referral to orthopedic surgery to discuss intra-articular steroid injection or to explore other treatment options.  Review of Systems  Musculoskeletal:  Positive for joint pain (Bilateral hip pain (R>L)).  All other systems reviewed and are negative.     Objective:    BP (!) 155/82   Pulse 80   Ht 5' 4.5" (1.638 m)   Wt 166 lb 6.4 oz (75.5 kg)   SpO2 96%   BMI 28.12 kg/m   Physical Exam Musculoskeletal:     Comments: ROM of the right hip is generally reduced in all directions.  Positive logroll, FABER/FADIR testing.  ROM of the left hip is generally intact.  Pain is elicited with FABER.  No gait disturbance appreciated.       Assessment & Plan:   Problem List Items Addressed This Visit       Osteoarthritis of hips, bilateral    Presenting today for an acute visit to discuss persistent bilateral hip pain (R>L).  X-rays from November 2023 demonstrated mild bilateral femoral acetabular  osteoarthritis.  She has been treated with conservative measures (NSAIDs and physical therapy) but her pain has not improved.  She has requested referral to orthopedic surgery today. -Orthopedic surgery referral placed today -She will return to care for routine follow-up with [name] in April      Return if symptoms worsen or fail to improve.  Johnette Abraham, MD

## 2022-12-25 NOTE — Patient Instructions (Signed)
It was a pleasure to see you today.  Thank you for giving [place] the opportunity to be involved in your care.  Below is a brief recap of your visit and next steps.  We will plan to see you again in April.  Summary Orthopedic surgery referral placed today We will see you for follow up in next month and then again in April

## 2022-12-27 ENCOUNTER — Other Ambulatory Visit: Payer: Self-pay | Admitting: Internal Medicine

## 2022-12-27 DIAGNOSIS — M25551 Pain in right hip: Secondary | ICD-10-CM

## 2023-01-01 ENCOUNTER — Encounter: Payer: Self-pay | Admitting: Orthopedic Surgery

## 2023-01-01 ENCOUNTER — Ambulatory Visit (INDEPENDENT_AMBULATORY_CARE_PROVIDER_SITE_OTHER): Payer: Medicare Other | Admitting: Orthopedic Surgery

## 2023-01-01 VITALS — BP 134/82 | HR 81 | Ht 63.0 in | Wt 166.0 lb

## 2023-01-01 DIAGNOSIS — M25551 Pain in right hip: Secondary | ICD-10-CM

## 2023-01-01 NOTE — Patient Instructions (Signed)

## 2023-01-01 NOTE — Progress Notes (Signed)
New Patient Visit  Assessment: [name] is a 72 y.o. female with the following: 1. Right hip pain  Plan: [name] has pain in the right groin area.  Radiographs demonstrates mild degenerative changes, with some associated osteophytes.  The hip joint is clearly aggravated, as she is tolerating minimal range of motion on physical exam.  It is possible that she is injured her labrum.  Medicines and therapy have not provided sufficient relief.  I offered her a steroid injection, and this was completed today under ultrasound guidance.  She will contact the clinic if she has any issues.  Procedure note injection - Right hip, ultrasound guidance   Verbal consent was obtained to inject the Right hip joint  Timeout was completed to confirm the site of injection.   Using the ultrasound, the femoral neck was identified.  The skin was prepped with alcohol and ethyl chloride was sprayed at the injection site.  A 21-gauge needle was used to inject 40 mg of Depo-Medrol and 1% lidocaine (4 cc) into the right hip joint, directly in contact with the femoral neck of the Right hip using a direct anterior approach.  The needle was visualized entering the hip joint, and the medication was also visualized. There were no complications.  A sterile bandage was applied.   Note: In order to accurately identify the placement of the needle, ultrasound was required, to increase the accuracy, and specificity of the injection.   Follow-up: Return if symptoms worsen or fail to improve.  Subjective:  Chief Complaint  Patient presents with   Hip Pain    Bilateral R> L- no injury been hurting for about 2 months Dr. Doren Custard said its arthritis  NDC: [phone]    History of Present Illness: [name] is a 72 y.o. female who has been referred by Olin Hauser, MD for evaluation of right hip pain.  She has had pain in the right groin for the past 2 months.  No specific injury.  She does report that she has fallen  in the past.  She has a number of holes in her yard, that has caused her to trip.  Other than that, no specific injury potential.  She has been taking meloxicam with limited improvement.  Therapy has not been effective.  She was evaluated by her PCP, and told that she has arthritis.  She localizes the pain to the groin, radiating distally.   Review of Systems: No fevers or chills No numbness or tingling No chest pain No shortness of breath No bowel or bladder dysfunction No GI distress No headaches   Medical History:  Past Medical History:  Diagnosis Date   Allergy    Anxiety    Back pain with radiation 07/13/2012   GERD (gastroesophageal reflux disease)    Glaucoma    Hyperlipidemia    Osteoarthritis     Past Surgical History:  Procedure Laterality Date   COLONOSCOPY WITH PROPOFOL N/A 01/02/2022   Procedure: COLONOSCOPY WITH PROPOFOL;  Surgeon: Harvel Quale, MD;  [facility];  Service: Gastroenterology;  Laterality: N/A;  1130   cyst removed from right breast     LAPAROSCOPIC SALPINGO OOPHERECTOMY Right 04/09/2017   Procedure: LAPAROSCOPIC RIGHT SALPINGO OOPHORECTOMY; RIGHT OVARIAN BENIGN CYSTIC TERATOMA;  Surgeon: Florian Buff, MD;  Location: [facility];  Service: Gynecology;  Laterality: Right;   PARTIAL HYSTERECTOMY     TUBAL LIGATION      Family History  Problem Relation Age of  Onset   Heart disease Mother    Heart disease Father    Diabetes Father    Hypertension Sister    Parkinson's disease Brother    Heart disease Maternal Grandmother    Cancer Paternal Grandmother        breast   Diabetes Paternal Grandfather    Hypertension Sister    Hypertension Sister    Down syndrome Son    Eczema Son    Post-traumatic stress disorder Daughter    Social History   Tobacco Use   Smoking status: Never   Smokeless tobacco: Never  Vaping Use   Vaping Use: Never used  Substance Use Topics   Alcohol use: Not Currently    Comment: occasionally  wine   Drug use: No    Allergies  Allergen Reactions   Ibuprofen Palpitations and Other (See Comments)    Rapid heart rate.    Current Meds  Medication Sig   amLODipine (NORVASC) 5 MG tablet Take 1 tablet (5 mg total) by mouth daily.   Ascorbic Acid (VITAMIN C PO) Take 1 tablet by mouth daily. Power C   citalopram (CELEXA) 40 MG tablet Take 1 tablet (40 mg total) by mouth daily.   clotrimazole-betamethasone (LOTRISONE) cream Apply 1 application topically 2 (two) times daily.   ELDERBERRY PO Take 2 capsules by mouth daily.   fluticasone (FLONASE) 50 MCG/ACT nasal spray Place 2 sprays into both nostrils daily. Can do in the morning, or do one spray in more and one at night.   gabapentin (NEURONTIN) 300 MG capsule Take 1 capsule (300 mg total) by mouth at bedtime.   loratadine (ALLERGY RELIEF) 10 MG tablet TAKE (1) TABLET BY MOUTH ONCE DAILY AS NEEDED FOR ALLERGIES.   meloxicam (MOBIC) 7.5 MG tablet TAKE ONE TABLET BY MOUTH ONCE DAILY.   Omega-3 Fatty Acids (FISH OIL CONCENTRATE PO) Take 1,576 mg by mouth daily. 788 mg each   oxybutynin (DITROPAN) 5 MG tablet TAKE (1) TABLET BY MOUTH TWICE DAILY.   pantoprazole (PROTONIX) 20 MG tablet Take 1 tablet (20 mg total) by mouth daily.   vitamin B-12 (CYANOCOBALAMIN) 1000 MCG tablet Take 1,000 mcg by mouth daily.   Vitamin E 400 units TABS Take 400 Units by mouth daily.    Objective: BP 134/82   Pulse 81   Ht '5\' 3"'$  (1.6 m)   Wt 166 lb (75.3 kg)   BMI 29.41 kg/m   Physical Exam:  General: Alert and oriented. and No acute distress. Gait: Right sided antalgic gait.  The right hip demonstrates no deformity.  No swelling.  No redness.  No bruising.  The hip joint is very irritated.  She tolerates very little internal and external rotation of the right hip.  There is tenderness to palpation directly over the anterior hip.  No tenderness to palpation laterally.  Toes are warm and well-perfused.   IMAGING: I personally reviewed images  previously obtained in clinic  X-rays of the right hip were previously obtained.  Well-maintained joint space.  There are small osteophytes laterally.  No obvious impingement.  No collapse of the femoral head.   New Medications:  No orders of the defined types were placed in this encounter.     Mordecai Rasmussen, MD  01/01/2023 11:19 AM

## 2023-01-07 ENCOUNTER — Other Ambulatory Visit: Payer: Self-pay | Admitting: Family Medicine

## 2023-01-17 ENCOUNTER — Encounter: Payer: Self-pay | Admitting: Family Medicine

## 2023-01-17 ENCOUNTER — Ambulatory Visit (INDEPENDENT_AMBULATORY_CARE_PROVIDER_SITE_OTHER): Payer: Medicare Other | Admitting: Family Medicine

## 2023-01-17 VITALS — Ht 63.0 in | Wt 166.0 lb

## 2023-01-17 DIAGNOSIS — Z0000 Encounter for general adult medical examination without abnormal findings: Secondary | ICD-10-CM | POA: Diagnosis not present

## 2023-01-17 NOTE — Progress Notes (Signed)
Subjective:   [name] is a 72 y.o. female who presents for Medicare Annual (Subsequent) preventive examination.  I connected with  [name] on 01/17/23 by a audio enabled telemedicine application and verified that I am speaking with the correct person using two identifiers.  Patient Location: Home  Provider Location: Office/Clinic  I discussed the limitations of evaluation and management by telemedicine. The patient expressed understanding and agreed to proceed.  Review of Systems    Patient denies pain, fever, chills, chest pain, palpations ,shortness of breath, blurred vision,cough, abdominal pain, nausea, vomiting, headache, dizziness. Patient is not feeling nervous or anxious   Cardiac Risk Factors include: advanced age (>2men, >59 women)     Objective:    Today's Vitals   01/17/23 1500  Weight: 166 lb (75.3 kg)  Height: 5\' 3"  (1.6 m)   Body mass index is 29.41 kg/m.     01/17/2023    3:07 PM 11/05/2022    8:27 AM 01/14/2022    4:11 PM 12/28/2021    8:34 AM 09/28/2020    9:49 AM 09/28/2019    9:47 AM 09/21/2018    8:28 AM  Advanced Directives  Does Patient Have a Medical Advance Directive? No No;Yes No No No No Yes;No  Type of Corporate treasurer of [place];Living will       Does patient want to make changes to medical advance directive? No - Patient declined No - Patient declined     Yes (ED - Information included in AVS)  Copy of [place] in Chart?  No - copy requested       Would patient like information on creating a medical advance directive? No - Patient declined No - Patient declined Yes (ED - Information included in AVS) No - Patient declined No - Patient declined No - Patient declined No - Patient declined    Current Medications (verified) Outpatient Encounter Medications as of 01/17/2023  Medication Sig   amLODipine (NORVASC) 5 MG tablet Take 1 tablet (5 mg total) by mouth daily.   Ascorbic Acid (VITAMIN C PO) Take 1  tablet by mouth daily. Power C   citalopram (CELEXA) 40 MG tablet Take 1 tablet (40 mg total) by mouth daily.   clotrimazole-betamethasone (LOTRISONE) cream Apply 1 application topically 2 (two) times daily.   ELDERBERRY PO Take 2 capsules by mouth daily.   fluticasone (FLONASE) 50 MCG/ACT nasal spray Place 2 sprays into both nostrils daily. Can do in the morning, or do one spray in more and one at night.   gabapentin (NEURONTIN) 300 MG capsule Take 1 capsule (300 mg total) by mouth at bedtime.   loratadine (ALLERGY RELIEF) 10 MG tablet TAKE (1) TABLET BY MOUTH ONCE DAILY AS NEEDED FOR ALLERGIES.   meloxicam (MOBIC) 7.5 MG tablet TAKE ONE TABLET BY MOUTH ONCE DAILY.   Omega-3 Fatty Acids (FISH OIL CONCENTRATE PO) Take 1,576 mg by mouth daily. 788 mg each   oxybutynin (DITROPAN) 5 MG tablet TAKE (1) TABLET BY MOUTH TWICE DAILY.   pantoprazole (PROTONIX) 20 MG tablet TAKE 1 TABLET BY MOUTH ONCE DAILY.   vitamin B-12 (CYANOCOBALAMIN) 1000 MCG tablet Take 1,000 mcg by mouth daily.   Vitamin E 400 units TABS Take 400 Units by mouth daily.   rosuvastatin (CRESTOR) 40 MG tablet TAKE 1 TABLET BY MOUTH ONCE DAILY.   No facility-administered encounter medications on file as of 01/17/2023.    Allergies (verified) Ibuprofen   History: Past Medical  History:  Diagnosis Date   Allergy    Anxiety    Back pain with radiation 07/13/2012   GERD (gastroesophageal reflux disease)    Glaucoma    Hyperlipidemia    Osteoarthritis    Past Surgical History:  Procedure Laterality Date   COLONOSCOPY WITH PROPOFOL N/A 01/02/2022   Procedure: COLONOSCOPY WITH PROPOFOL;  Surgeon: Harvel Quale, MD;  Location: [facility];  Service: Gastroenterology;  Laterality: N/A;  1130   cyst removed from right breast     LAPAROSCOPIC SALPINGO OOPHERECTOMY Right 04/09/2017   Procedure: LAPAROSCOPIC RIGHT SALPINGO OOPHORECTOMY; RIGHT OVARIAN BENIGN CYSTIC TERATOMA;  Surgeon: Florian Buff, MD;  Location: [facility];   Service: Gynecology;  Laterality: Right;   PARTIAL HYSTERECTOMY     TUBAL LIGATION     Family History  Problem Relation Age of Onset   Heart disease Mother    Heart disease Father    Diabetes Father    Hypertension Sister    Parkinson's disease Brother    Heart disease Maternal Grandmother    Cancer Paternal Grandmother        breast   Diabetes Paternal Grandfather    Hypertension Sister    Hypertension Sister    Down syndrome Son    Eczema Son    Post-traumatic stress disorder Daughter    Social History   Socioeconomic History   Marital status: Single    Spouse name: Not on file   Number of children: 3   Years of education: 12   Highest education level: 12th grade  Occupational History   Not on file  Tobacco Use   Smoking status: Never   Smokeless tobacco: Never  Vaping Use   Vaping Use: Never used  Substance and Sexual Activity   Alcohol use: Not Currently    Comment: occasionally wine   Drug use: No   Sexual activity: Not Currently    Birth control/protection: Surgical    Comment: hyst  Other Topics Concern   Not on file  Social History Narrative   Not on file   Social Determinants of Health   Financial Resource Strain: Low Risk  (01/17/2023)   Overall Financial Resource Strain (CARDIA)    Difficulty of Paying Living Expenses: Not hard at all  Food Insecurity: No Food Insecurity (01/17/2023)   Hunger Vital Sign    Worried About Running Out of Food in the Last Year: Never true    Ran Out of Food in the Last Year: Never true  Transportation Needs: No Transportation Needs (01/17/2023)   PRAPARE - Hydrologist (Medical): No    Lack of Transportation (Non-Medical): No  Physical Activity: Sufficiently Active (01/17/2023)   Exercise Vital Sign    Days of Exercise per Week: 7 days    Minutes of Exercise per Session: 60 min  Stress: Stress Concern Present (01/17/2023)   [place]    Feeling of Stress : To some extent  Social Connections: Moderately Integrated (01/17/2023)   Social Connection and Isolation Panel [NHANES]    Frequency of Communication with Friends and Family: More than three times a week    Frequency of Social Gatherings with Friends and Family: More than three times a week    Attends Religious Services: More than 4 times per year    Active Member of [employer] or Organizations: Yes    Attends Archivist Meetings: More than 4 times per year  Marital Status: Patient unable to answer    Tobacco Counseling Counseling given: Not Answered   Clinical Intake:  Pre-visit preparation completed: No  Pain : No/denies pain     BMI - recorded: 29.41 Nutritional Status: BMI 25 -29 Overweight Diabetes: No  How often do you need to have someone help you when you read instructions, pamphlets, or other written materials from your doctor or pharmacy?: 1 - Never  Diabetic? No  Interpreter Needed?: No      Activities of Daily Living    01/17/2023    3:07 PM  In your present state of health, do you have any difficulty performing the following activities:  Hearing? 0  Vision? 0  Difficulty concentrating or making decisions? 0  Walking or climbing stairs? 0  Dressing or bathing? 0  Doing errands, shopping? 0  Preparing Food and eating ? N  Using the Toilet? N  In the past six months, have you accidently leaked urine? N  Do you have problems with loss of bowel control? N  Managing your Medications? N  Managing your Finances? N  Housekeeping or managing your Housekeeping? N    Patient Care Team: Fayrene Helper, MD as PCP - General  Indicate any recent Medical Services you may have received from other than [facility] providers in the past year (date may be approximate).     Assessment:   This is a routine wellness examination for Alynah.  Hearing/Vision screen No results found.  Dietary issues and exercise activities  discussed:   Discussed lifestyle modifications follow diet low in saturated fat, reduce dietary salt intake, avoid fatty foods, maintain an exercise routine 3 to 5 days a week for a minimum total of 150 minutes.   Goals Addressed   None    Depression Screen    01/17/2023    3:05 PM 12/25/2022    1:55 PM 09/24/2022    9:31 AM 09/10/2022    8:31 AM 07/26/2022    1:46 PM 02/08/2022    9:41 AM 01/14/2022    4:12 PM  PHQ 2/9 Scores  PHQ - 2 Score 0 0 0 0 0 0 0  PHQ- 9 Score  0 0        Fall Risk    01/17/2023    3:07 PM 12/25/2022    1:55 PM 09/24/2022    9:31 AM 09/10/2022    8:31 AM 07/26/2022    1:46 PM  [place] in the past year? 0 0 0 0 0  Number falls in past yr: 0 0 0 0 0  Injury with Fall? 0 0 0 0 0  Risk for fall due to : No Fall Risks No Fall Risks No Fall Risks No Fall Risks No Fall Risks  Follow up  Falls evaluation completed Falls evaluation completed Falls evaluation completed Falls evaluation completed    FALL RISK PREVENTION PERTAINING TO THE HOME:  Any stairs in or around the home? No  If so, are there any without handrails? No  Home free of loose throw rugs in walkways, pet beds, electrical cords, etc? Yes  Adequate lighting in your home to reduce risk of falls? Yes   ASSISTIVE DEVICES UTILIZED TO PREVENT FALLS:  Life alert? No  Use of a cane, walker or w/c? No  Grab bars in the bathroom? No  Shower chair or bench in shower? No  Elevated toilet seat or a handicapped toilet? Yes    Cognitive Function:  01/17/2023    3:07 PM 01/14/2022    4:15 PM 09/28/2020    9:51 AM 09/28/2019    9:48 AM 09/21/2018    8:31 AM  6CIT Screen  What Year? 0 points 0 points 0 points 0 points 0 points  What month? 0 points 0 points 0 points 0 points 0 points  What time? 0 points 0 points 0 points 0 points 0 points  Count back from 20 0 points 0 points 0 points 0 points 0 points  Months in reverse 0 points 0 points 0 points 0 points 0 points  Repeat phrase  0 points 0 points 0 points 0 points 2 points  Total Score 0 points 0 points 0 points 0 points 2 points    Immunizations Immunization History  Administered Date(s) Administered   Fluad Quad(high Dose 65+) 06/28/2019, 08/16/2020, 07/26/2022   Influenza Split 07/13/2012, 08/13/2016   Influenza, High Dose Seasonal PF 09/07/2018   Influenza-Unspecified 08/21/2021   Moderna SARS-COV2 Booster Vaccination 10/05/2020   Moderna Sars-Covid-2 Vaccination 02/05/2020, 03/07/2020, 10/05/2020, 01/25/2022   Pneumococcal Conjugate-13 03/06/2017   Pneumococcal Polysaccharide-23 09/08/2018   Td 05/17/2008   Zoster Recombinat (Shingrix) 11/22/2021, 01/25/2022   Zoster, Live 07/13/2012    TDAP status: Due, Education has been provided regarding the importance of this vaccine. Advised may receive this vaccine at local pharmacy or Health Dept. Aware to provide a copy of the vaccination record if obtained from local pharmacy or Health Dept. Verbalized acceptance and understanding.  Flu Vaccine status: Up to date  Pneumococcal vaccine status: Up to date  Covid-19 vaccine status: Information provided on how to obtain vaccines.   Qualifies for Shingles Vaccine? Yes   Zostavax completed Yes   Shingrix Completed?: Yes  Screening Tests Health Maintenance  Topic Date Due   DTaP/Tdap/Td (2 - Tdap) 05/17/2018   COVID-19 Vaccine (6 - 2023-24 season) 02/02/2023 (Originally 06/28/2022)   MAMMOGRAM  01/12/2024   COLONOSCOPY (Pts 45-46yrs Insurance coverage will need to be confirmed)  01/03/2032   Pneumonia Vaccine 45+ Years old  Completed   INFLUENZA VACCINE  Completed   DEXA SCAN  Completed   Hepatitis C Screening  Completed   Zoster Vaccines- Shingrix  Completed   HPV VACCINES  Aged Out    Health Maintenance  Health Maintenance Due  Topic Date Due   DTaP/Tdap/Td (2 - Tdap) 05/17/2018    Colorectal cancer screening: Type of screening: Colonoscopy. Completed 2023. Repeat every   years  Mammogram  status: Completed 2023. Repeat every year  Bone Density status: Completed 2019. Results reflect: Bone density results: NORMAL. Repeat every   years.  Lung Cancer Screening: (Low Dose CT Chest recommended if Age 48-80 years, 30 pack-year currently smoking OR have quit w/in 15years.) does not qualify.   Lung Cancer Screening Referral: N/A  Additional Screening:  Hepatitis C Screening: does not qualify; Completed 2009 Negative  Vision Screening: Recommended annual ophthalmology exams for early detection of glaucoma and other disorders of the eye. Is the patient up to date with their annual eye exam?  Yes  Who is the provider or what is the name of the office in which the patient attends annual eye exams? [facility] If pt is not established with a provider, would they like to be referred to a provider to establish care? No .   Dental Screening: Recommended annual dental exams for proper oral hygiene  Community Resource Referral / Chronic Care Management: CRR required this visit?  No   CCM required this visit?  No      Plan:     I have personally reviewed and noted the following in the patient's chart:   Medical and social history Use of alcohol, tobacco or illicit drugs  Current medications and supplements including opioid prescriptions. Patient is not currently taking opioid prescriptions. Functional ability and status Nutritional status Physical activity Advanced directives List of other physicians Hospitalizations, surgeries, and ER visits in previous 12 months Vitals Screenings to include cognitive, depression, and falls Referrals and appointments  In addition, I have reviewed and discussed with patient certain preventive protocols, quality metrics, and best practice recommendations. A written personalized care plan for preventive services as well as general preventive health recommendations were provided to patient.     [place], [place]   01/17/2023

## 2023-01-20 ENCOUNTER — Other Ambulatory Visit: Payer: Self-pay | Admitting: Internal Medicine

## 2023-01-20 ENCOUNTER — Other Ambulatory Visit: Payer: Self-pay | Admitting: Family Medicine

## 2023-01-20 DIAGNOSIS — M25551 Pain in right hip: Secondary | ICD-10-CM

## 2023-02-07 ENCOUNTER — Encounter: Payer: Self-pay | Admitting: Internal Medicine

## 2023-02-07 ENCOUNTER — Ambulatory Visit (INDEPENDENT_AMBULATORY_CARE_PROVIDER_SITE_OTHER): Payer: Medicare Other | Admitting: Internal Medicine

## 2023-02-07 DIAGNOSIS — M25551 Pain in right hip: Secondary | ICD-10-CM

## 2023-02-07 MED ORDER — OXYCODONE HCL 5 MG PO TABS: 5.0000 mg | ORAL_TABLET | Freq: Four times a day (QID) | ORAL | 0 refills | Status: DC | PRN

## 2023-02-07 MED ORDER — MELOXICAM 7.5 MG PO TABS: 15.0000 mg | ORAL_TABLET | Freq: Every day | ORAL | 0 refills | Status: DC | PRN

## 2023-02-07 NOTE — Patient Instructions (Signed)
It was a pleasure to see you today.  Thank you for giving [place] the opportunity to be involved in your care.  Below is a brief recap of your visit and next steps.  We will plan to see you again next week.  Summary Increase meloxicam to 15 mg daily as needed Try tylenol every 8-12 hours as needed Add oxycodone 5 mg for breakthrough pain Follow up with Dr. Dallas Schimke next Tuesday Follow up with Dr. Lodema Hong as scheduled next week

## 2023-02-07 NOTE — Progress Notes (Unsigned)
   Acute Office Visit  Subjective:     Patient ID: [name], female    DOB: [birth year], 72 y.o.   MRN: [id number]  Chief Complaint  Patient presents with   Leg Pain    Right leg pain started on 01/06/2023.   [name] presents today for an acute visit endorsing pain in her right lower extremity.  She endorses pain along the anterior aspect of her right hip, radiating distally to her right knee.  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  She has previously been evaluated by Dr. Dallas Schimke (3/6) and received an ultrasound-guided steroid injection into the right hip joint.  She states that this alleviated her symptoms for approximately 2 weeks, however her symptoms have returned and are worse in intensity.  She is interested in additional treatment options today as she is currently attempting to manage her pain with meloxicam 7.5 mg daily.  Review of Systems  Musculoskeletal:  Positive for joint pain (Right hip pain).  All other systems reviewed and are negative.     Objective:    BP 129/71   Pulse 86   Ht 5\' 3"  (1.6 m)   Wt 163 lb 12.8 oz (74.3 kg)   SpO2 98%   BMI 29.02 kg/m   Physical Exam Musculoskeletal:     Comments: No obvious deformity on inspection of the right lower extremity.  ROM of the right hip is limited in all directions secondary to pain.  Positive logroll of the right lower extremity.  No TTP over the right greater trochanteric bursa.        Assessment & Plan:   Problem List Items Addressed This Visit       Right hip pain    Returning to care today for an acute visit for evaluation with right hip pain.  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  She underwent a steroid injection last month, which temporarily alleviated her symptoms x 2 weeks but states that they have since returned and the pain has worsened in intensity.  She is currently attempting to manage her pain with meloxicam  7.5 mg daily. -I recommended that she return to care with orthopedic surgery (Dr. Dallas Schimke) to discuss next steps in treatment.  In the interim, can increase meloxicam to 15 mg daily.  I have also prescribed oxycodone 5 mg #10 tablets as needed for breakthrough pain. -She will return to care next month for routine follow-up with Dr. Lodema Hong.       Meds ordered this encounter  Medications   meloxicam (MOBIC) 7.5 MG tablet    Sig: Take 2 tablets (15 mg total) by mouth daily as needed for pain.    Dispense:  60 tablet    Refill:  0   oxyCODONE (OXY IR/ROXICODONE) 5 MG immediate release tablet    Sig: Take 1 tablet (5 mg total) by mouth every 6 (six) hours as needed for up to 10 doses for severe pain or breakthrough pain.    Dispense:  10 tablet    Refill:  0    Return if symptoms worsen or fail to improve.  Billie Lade, MD

## 2023-02-11 ENCOUNTER — Encounter: Payer: Self-pay | Admitting: Orthopedic Surgery

## 2023-02-11 ENCOUNTER — Encounter: Payer: Medicare Other | Admitting: Family Medicine

## 2023-02-11 ENCOUNTER — Ambulatory Visit (INDEPENDENT_AMBULATORY_CARE_PROVIDER_SITE_OTHER): Payer: Medicare Other | Admitting: Orthopedic Surgery

## 2023-02-11 ENCOUNTER — Other Ambulatory Visit: Payer: Self-pay | Admitting: Family Medicine

## 2023-02-11 DIAGNOSIS — M25551 Pain in right hip: Secondary | ICD-10-CM | POA: Diagnosis not present

## 2023-02-11 DIAGNOSIS — Z1231 Encounter for screening mammogram for malignant neoplasm of breast: Secondary | ICD-10-CM

## 2023-02-11 NOTE — Progress Notes (Signed)
New Patient Visit  Assessment: [name] is a 72 y.o. female with the following: 1. Right hip pain  Plan: [name] has pain in the right groin area.  Prior steroid injection of the right hip resulted in approximate 75% improvement in her symptoms, for about 1 week.  Since then, the pain is returned.  Radiographs demonstrate mild degenerative changes overall.  She has completed physical therapy, and medications have not been effective.  At this point, I am recommending an MRI of her right hip.  Once the MRI is done, we will meet to discuss the findings.  Follow-up: Return for After MRI.  Subjective:  Chief Complaint  Patient presents with   Hip Pain    R hip pain, shot only lasted 1 wk per pt. States she got some pain meds when she went to see Dr. Durwin Nora that's helping but pain comes right back.     History of Present Illness: [name] is a 72 y.o. female who returns to clinic today for repeat evaluation of right hip pain.  She has had pain in her right groin that is progressively worsening.  I saw her approximately 6 weeks ago.  At that time, we completed ultrasound-guided injection of the right hip.  She notes improvement of her pain for approximate 1 week.  Up to 75% of her pain.  Since then, the pain is returned.  She has taken oxycodone intermittently.  She continues to take meloxicam.    Review of Systems: No fevers or chills No numbness or tingling No chest pain No shortness of breath No bowel or bladder dysfunction No GI distress No headaches   Objective: There were no vitals taken for this visit.  Physical Exam:  General: Alert and oriented. and No acute distress. Gait: Right sided antalgic gait.  The right hip demonstrates no deformity.  No swelling.  No redness.  No bruising.  The hip joint is very irritated.  She tolerates very little internal and external rotation of the right hip.  There is tenderness to palpation directly over the anterior hip.  No  tenderness to palpation laterally.  Toes are warm and well-perfused.  Negative straight leg raise.  Sensation is intact distally.   IMAGING: I personally reviewed images previously obtained in clinic   No new imaging obtained today.   New Medications:  No orders of the defined types were placed in this encounter.     Oliver Barre, MD  02/11/2023 11:59 AM

## 2023-02-11 NOTE — Patient Instructions (Signed)
While we are working on your approval for MRI please go ahead and call to schedule your appointment with [facility] today [employer] Scheduling [phone]

## 2023-02-12 NOTE — Assessment & Plan Note (Signed)
Returning to care today for an acute visit for evaluation with right hip pain.  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  She underwent a steroid injection last month, which temporarily alleviated her symptoms x 2 weeks but states that they have since returned and the pain has worsened in intensity.  She is currently attempting to manage her pain with meloxicam 7.5 mg daily. -I recommended that she return to care with orthopedic surgery (Dr. Dallas Schimke) to discuss next steps in treatment.  In the interim, can increase meloxicam to 15 mg daily.  I have also prescribed oxycodone 5 mg #10 tablets as needed for breakthrough pain. -She will return to care next month for routine follow-up with Dr. Lodema Hong.

## 2023-02-13 ENCOUNTER — Other Ambulatory Visit: Payer: Self-pay | Admitting: Family Medicine

## 2023-02-19 ENCOUNTER — Ambulatory Visit (HOSPITAL_COMMUNITY)
Admission: RE | Admit: 2023-02-19 | Discharge: 2023-02-19 | Disposition: A | Discharge status: Home / Self Care | Payer: Medicare Other | Source: Ambulatory Visit | Attending: Orthopedic Surgery | Admitting: Orthopedic Surgery

## 2023-02-19 DIAGNOSIS — R600 Localized edema: Secondary | ICD-10-CM | POA: Diagnosis not present

## 2023-02-19 DIAGNOSIS — M25551 Pain in right hip: Secondary | ICD-10-CM | POA: Insufficient documentation

## 2023-02-26 ENCOUNTER — Ambulatory Visit: Payer: Medicare Other | Admitting: Orthopedic Surgery

## 2023-02-26 ENCOUNTER — Encounter: Payer: Self-pay | Admitting: Orthopedic Surgery

## 2023-02-26 DIAGNOSIS — M25551 Pain in right hip: Secondary | ICD-10-CM

## 2023-02-26 DIAGNOSIS — M76891 Other specified enthesopathies of right lower limb, excluding foot: Secondary | ICD-10-CM

## 2023-02-26 NOTE — Progress Notes (Signed)
New Patient Visit  Assessment: [name] is a 72 y.o. female with the following: 1. Right hip pain; tendinosis of the right adductor musculature  Plan: [name] has pain in the right groin area.  We reviewed the findings of the MRI, which demonstrates tendinosis of the adductor musculature, as well as some tendinosis of the gluteus and hamstring muscles posterior.  On physical exam today, she does have tenderness within the musculature in her groin.  She also has some pain with resisted adduction, as well as pain with internal rotation of her hip.  The MRI findings are consistent with her presentation today.  We discussed returning to physical therapy, but she would like to be evaluated by Dr. Steward Martin who has more experience with hip arthroscopy, and may have additional ideas to improve her symptoms.  She is not against returning to physical therapy, but is interested in a second opinion.  I will contact Dr. Steward Martin directly, and we will place the referral.  Follow-up: Return for Referral to Dr. Steward Martin.  Subjective:  Chief Complaint  Patient presents with   Hip Pain    R hip pain, here for MRI results.     History of Present Illness: [name] is a 72 y.o. female who returns to clinic today for repeat evaluation of right hip pain.  She continues to have a lot of discomfort in her groin.  I previously injected her right hip, and she had 75% improvement in her symptoms for about a week.  Overall, her symptoms have been ongoing for the past 3 months.  No specific injury.  She was in physical therapy for approximately 3 weeks, with a little bit of improvement.  She is taking meloxicam, but this is starting to affect her stomach.  She has obtained an MRI, is here to discuss the results.    Review of Systems: No fevers or chills No numbness or tingling No chest pain No shortness of breath No bowel or bladder dysfunction No GI distress No headaches   Objective: There were no  vitals taken for this visit.  Physical Exam:  General: Alert and oriented. and No acute distress. Gait: Right sided antalgic gait.  The right hip demonstrates no deformity.  No swelling.  No redness.  No bruising.  She has mild tenderness over the anterior hip.  She has tenderness to palpation within the adductor musculature, extending into the groin.  This recreates her pain.  Pain gets worse with abduction of the right leg.  Pain deep within the groin with resisted hip adduction.  She tolerates flexion of the hip up to 90 degrees, but has discomfort with internal rotation.  With resisted straight leg raise, she also has pain in the groin.  Sensation is intact distally.    IMAGING: I personally reviewed images previously obtained in clinic  MRI right hip  IMPRESSION: 1. Mild degenerative changes at both hips with a small right hip joint effusion. No acute osseous findings. 2. Mild edema within the right hip adductor muscles, likely muscular strain. 3. 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 4. Lower lumbar spondylosis.  New Medications:  No orders of the defined types were placed in this encounter.     Ann Barre, MD  02/26/2023 10:35 AM

## 2023-02-27 ENCOUNTER — Ambulatory Visit (INDEPENDENT_AMBULATORY_CARE_PROVIDER_SITE_OTHER): Payer: Medicare Other | Admitting: Family Medicine

## 2023-02-27 ENCOUNTER — Encounter: Payer: Self-pay | Admitting: Family Medicine

## 2023-02-27 VITALS — BP 121/80 | HR 96 | Ht 65.0 in | Wt 161.1 lb

## 2023-02-27 DIAGNOSIS — Z23 Encounter for immunization: Secondary | ICD-10-CM

## 2023-02-27 DIAGNOSIS — E0789 Other specified disorders of thyroid: Secondary | ICD-10-CM | POA: Diagnosis not present

## 2023-02-27 DIAGNOSIS — Z1329 Encounter for screening for other suspected endocrine disorder: Secondary | ICD-10-CM

## 2023-02-27 DIAGNOSIS — R7303 Prediabetes: Secondary | ICD-10-CM | POA: Diagnosis not present

## 2023-02-27 DIAGNOSIS — Z0000 Encounter for general adult medical examination without abnormal findings: Secondary | ICD-10-CM

## 2023-02-27 DIAGNOSIS — E559 Vitamin D deficiency, unspecified: Secondary | ICD-10-CM

## 2023-02-27 DIAGNOSIS — E785 Hyperlipidemia, unspecified: Secondary | ICD-10-CM

## 2023-02-27 DIAGNOSIS — I10 Essential (primary) hypertension: Secondary | ICD-10-CM

## 2023-02-27 DIAGNOSIS — M5431 Sciatica, right side: Secondary | ICD-10-CM

## 2023-02-27 MED ORDER — TETANUS-DIPHTH-ACELL PERTUSSIS 5-2.5-18.5 LF-MCG/0.5 IM SUSP: 0.5000 mL | Freq: Once | INTRAMUSCULAR | 0 refills | Status: DC

## 2023-02-27 NOTE — Assessment & Plan Note (Signed)
Annual exam as documented. . Immunization and cancer screening needs are specifically addressed at this visit.  

## 2023-02-27 NOTE — Patient Instructions (Signed)
F/U in mid September, call if you need me sooner  Resume bed time gabapentin for pain  Please bring all medications to your next visit  Labs today, CBC, lipid, cmp and EGFr, HBA1C, TSH and vit   Handicap sticker for 6 months provided today  Need TdAP we will give you the sxcript to take to your pharmacy  Thanks for choosing [facility], we consider it a privelige to serve you.

## 2023-02-27 NOTE — Assessment & Plan Note (Signed)
Resume bedtime gabapentin and f/u with Ortho

## 2023-02-27 NOTE — Progress Notes (Signed)
    LASHAWNTA Martin     MRN: [id number]      DOB: [birth year]  Chief Complaint  Patient presents with   Annual Exam    Cpe today. Would like to discuss handicap sticker due to right leg gives out at times.     HPI: Patient is in for annual physical exam. Immunization is reviewed , and  she needs TdAP 6 month h/o uncontrolled right buttock, groin and anterior RLE pain, no response to treatment so far has appt in 2 weeks to a Specialist she has been referred to by Orthopedics, c/o chronic uncontrolled pain and is requesting a temporary handicap sticker   PE: BP 121/80   Pulse 96   Ht 5\' 5"  (1.651 m)   Wt 161 lb 1.3 oz (73.1 kg)   SpO2 96%   BMI 26.81 kg/m   Pleasant  female, alert and oriented x 3, in no cardio-pulmonary distress. Afebrile. HEENT No facial trauma or asymetry. Sinuses non tender.  Extra occullar muscles intact.. External ears normal, . Neck: supple, no adenopathy,JVD or thyromegaly.No bruits.  Chest: Clear to ascultation bilaterally.No crackles or wheezes. Non tender to palpation  Cardiovascular system; Heart sounds normal,  S1 and  S2 ,no S3.  No murmur, or thrill. Apical beat not displaced Peripheral pulses normal.  Abdomen: Soft, non tender  Musculoskeletal exam: Decreased ROM of  lumbar spine, adequate in hips , shoulders and knees. No deformity ,swelling or crepitus noted. No muscle wasting or atrophy.  Localized tenderness over right SI joint  Neurologic: Cranial nerves 2 to 12 intact. Power, tone ,sensation  normal throughout. disturbance in gait. No tremor.  Skin: Intact, no ulceration, erythema , scaling or rash noted. Pigmentation normal throughout  Psych; Normal mood and affect. Judgement and concentration normal   Assessment & Plan:  Annual physical exam Annual exam as documented.  Immunization and cancer screening needs are specifically addressed at this visit.   Sciatica of right side Resume bedtime gabapentin and f/u with  Ortho

## 2023-03-01 LAB — CBC WITH DIFFERENTIAL/PLATELET
Basophils Absolute: 0 10*3/uL (ref 0.0–0.2)
Basos: 1 %
EOS (ABSOLUTE): 0.4 10*3/uL (ref 0.0–0.4)
Eos: 7 %
Hematocrit: 37 % (ref 34.0–46.6)
Hemoglobin: 12.1 g/dL (ref 11.1–15.9)
Immature Grans (Abs): 0 10*3/uL (ref 0.0–0.1)
Immature Granulocytes: 0 %
Lymphocytes Absolute: 3.3 10*3/uL — ABNORMAL HIGH (ref 0.7–3.1)
Lymphs: 51 %
MCH: 31 pg (ref 26.6–33.0)
MCHC: 32.7 g/dL (ref 31.5–35.7)
MCV: 95 fL (ref 79–97)
Monocytes Absolute: 0.5 10*3/uL (ref 0.1–0.9)
Monocytes: 8 %
Neutrophils Absolute: 2.1 10*3/uL (ref 1.4–7.0)
Neutrophils: 33 %
Platelets: 251 10*3/uL (ref 150–450)
RBC: 3.9 x10E6/uL (ref 3.77–5.28)
RDW: 13.1 % (ref 11.7–15.4)
WBC: 6.3 10*3/uL (ref 3.4–10.8)

## 2023-03-01 LAB — LIPID PANEL
Chol/HDL Ratio: 2.3 ratio (ref 0.0–4.4)
Cholesterol, Total: 162 mg/dL (ref 100–199)
HDL: 69 mg/dL (ref >39)
LDL Chol Calc (NIH): 71 mg/dL (ref 0–99)
Triglycerides: 124 mg/dL (ref 0–149)
VLDL Cholesterol Cal: 22 mg/dL (ref 5–40)

## 2023-03-01 LAB — CMP14+EGFR
ALT: 35 IU/L — ABNORMAL HIGH (ref 0–32)
AST: 30 IU/L (ref 0–40)
Albumin/Globulin Ratio: 1.7 (ref 1.2–2.2)
Albumin: 4.5 g/dL (ref 3.8–4.8)
Alkaline Phosphatase: 60 IU/L (ref 44–121)
BUN/Creatinine Ratio: 8 — ABNORMAL LOW (ref 12–28)
BUN: 7 mg/dL — ABNORMAL LOW (ref 8–27)
Bilirubin Total: 0.3 mg/dL (ref 0.0–1.2)
CO2: 25 mmol/L (ref 20–29)
Calcium: 10.2 mg/dL (ref 8.7–10.3)
Chloride: 100 mmol/L (ref 96–106)
Creatinine, Ser: 0.87 mg/dL (ref 0.57–1.00)
Globulin, Total: 2.7 g/dL (ref 1.5–4.5)
Glucose: 86 mg/dL (ref 70–99)
Potassium: 4 mmol/L (ref 3.5–5.2)
Sodium: 139 mmol/L (ref 134–144)
Total Protein: 7.2 g/dL (ref 6.0–8.5)
eGFR: 71 mL/min/{1.73_m2} (ref >59)

## 2023-03-01 LAB — VITAMIN D 25 HYDROXY (VIT D DEFICIENCY, FRACTURES): Vit D, 25-Hydroxy: 78.3 ng/mL (ref 30.0–100.0)

## 2023-03-01 LAB — HEMOGLOBIN A1C
Est. average glucose Bld gHb Est-mCnc: 123 mg/dL
Hgb A1c MFr Bld: 5.9 % — ABNORMAL HIGH (ref 4.8–5.6)

## 2023-03-01 LAB — TSH: TSH: 1.79 u[IU]/mL (ref 0.450–4.500)

## 2023-03-03 ENCOUNTER — Other Ambulatory Visit: Payer: Self-pay | Admitting: Family Medicine

## 2023-03-03 DIAGNOSIS — F4323 Adjustment disorder with mixed anxiety and depressed mood: Secondary | ICD-10-CM

## 2023-03-06 ENCOUNTER — Other Ambulatory Visit: Payer: Self-pay | Admitting: Family Medicine

## 2023-03-12 ENCOUNTER — Ambulatory Visit (INDEPENDENT_AMBULATORY_CARE_PROVIDER_SITE_OTHER): Payer: Medicare Other | Admitting: Orthopaedic Surgery

## 2023-03-12 DIAGNOSIS — S73191A Other sprain of right hip, initial encounter: Secondary | ICD-10-CM | POA: Diagnosis not present

## 2023-03-12 NOTE — Progress Notes (Signed)
Chief Complaint: Right hip pain     History of Present Illness:    [name] is a 72 y.o. female with right hip joint which has been going on for the last 3 to 4 months.  She is here today for further discussion.  She has been seeing Dr. Thane Edu this for this and performed an injection of the right hip.  She states that she got approximately 1 relief of good relief from this.  She has been experiencing swelling in the right lower extremity which does correlate somewhat with activity level.  She is continuing to be active and she is taking Mobic to relieve her pain.  This does help somewhat.  She has worse pain at the end of the day.    Surgical History:   None  PMH/PSH/Family History/Social History/Meds/Allergies:    Past Medical History:  Diagnosis Date   Allergy    Anxiety    Back pain with radiation 07/13/2012   GERD (gastroesophageal reflux disease)    Glaucoma    Hyperlipidemia    Osteoarthritis    Past Surgical History:  Procedure Laterality Date   COLONOSCOPY WITH PROPOFOL N/A 01/02/2022   Procedure: COLONOSCOPY WITH PROPOFOL;  Surgeon: Dolores Frame, MD;  Location: [facility];  Service: Gastroenterology;  Laterality: N/A;  1130   cyst removed from right breast     LAPAROSCOPIC SALPINGO OOPHERECTOMY Right 04/09/2017   Procedure: LAPAROSCOPIC RIGHT SALPINGO OOPHORECTOMY; RIGHT OVARIAN BENIGN CYSTIC TERATOMA;  Surgeon: Lazaro Arms, MD;  Location: [facility];  Service: Gynecology;  Laterality: Right;   PARTIAL HYSTERECTOMY     TUBAL LIGATION     Social History   Socioeconomic History   Marital status: Single    Spouse name: Not on file   Number of children: 3   Years of education: 62   Highest education level: 12th grade  Occupational History   Not on file  Tobacco Use   Smoking status: Never   Smokeless tobacco: Never  Vaping Use   Vaping Use: Never used  Substance and Sexual Activity   Alcohol use: Not  Currently    Comment: occasionally wine   Drug use: No   Sexual activity: Not Currently    Birth control/protection: Surgical    Comment: hyst  Other Topics Concern   Not on file  Social History Narrative   Not on file   Social Determinants of Health   Financial Resource Strain: Low Risk  (01/17/2023)   Overall Financial Resource Strain (CARDIA)    Difficulty of Paying Living Expenses: Not hard at all  Food Insecurity: No Food Insecurity (01/17/2023)   Hunger Vital Sign    Worried About Running Out of Food in the Last Year: Never true    Ran Out of Food in the Last Year: Never true  Transportation Needs: No Transportation Needs (01/17/2023)   PRAPARE - Administrator, Civil Service (Medical): No    Lack of Transportation (Non-Medical): No  Physical Activity: Sufficiently Active (01/17/2023)   Exercise Vital Sign    Days of Exercise per Week: 7 days    Minutes of Exercise per Session: 60 min  Stress: Stress Concern Present (01/17/2023)   [employer] of Occupational Health - Occupational Stress Questionnaire    Feeling of Stress : To  some extent  Social Connections: Moderately Integrated (01/17/2023)   Social Connection and Isolation Panel [NHANES]    Frequency of Communication with Friends and Family: More than three times a week    Frequency of Social Gatherings with Friends and Family: More than three times a week    Attends Religious Services: More than 4 times per year    Active Member of [employer] or Organizations: Yes    Attends Engineer, structural: More than 4 times per year    Marital Status: Patient unable to answer   Family History  Problem Relation Age of Onset   Heart disease Mother    Heart disease Father    Diabetes Father    Hypertension Sister    Parkinson's disease Brother    Heart disease Maternal Grandmother    Cancer Paternal Grandmother        breast   Diabetes Paternal Grandfather    Hypertension Sister    Hypertension Sister     Down syndrome Son    Eczema Son    Post-traumatic stress disorder Daughter    Allergies  Allergen Reactions   Ibuprofen Palpitations and Other (See Comments)    Rapid heart rate.   Current Outpatient Medications  Medication Sig Dispense Refill   amLODipine (NORVASC) 5 MG tablet Take 1 tablet (5 mg total) by mouth daily. 30 tablet 2   Ascorbic Acid (VITAMIN C PO) Take 1 tablet by mouth daily. Power C     citalopram (CELEXA) 40 MG tablet TAKE 1 TABLET BY MOUTH ONCE DAILY. 30 tablet 0   clotrimazole-betamethasone (LOTRISONE) cream Apply 1 application topically 2 (two) times daily. 45 g 1   ELDERBERRY PO Take 2 capsules by mouth daily.     fluticasone (FLONASE) 50 MCG/ACT nasal spray Place 2 sprays into both nostrils daily. Can do in the morning, or do one spray in more and one at night. 16 g 2   gabapentin (NEURONTIN) 300 MG capsule Take 1 capsule (300 mg total) by mouth at bedtime. 90 capsule 3   loratadine (ALLERGY RELIEF) 10 MG tablet TAKE (1) TABLET BY MOUTH ONCE DAILY AS NEEDED FOR ALLERGIES. 90 tablet 0   Omega-3 Fatty Acids (FISH OIL CONCENTRATE PO) Take 1,576 mg by mouth daily. 788 mg each     oxybutynin (DITROPAN) 5 MG tablet TAKE (1) TABLET BY MOUTH TWICE DAILY. 60 tablet 0   pantoprazole (PROTONIX) 20 MG tablet TAKE 1 TABLET BY MOUTH ONCE DAILY. 30 tablet 0   rosuvastatin (CRESTOR) 40 MG tablet TAKE 1 TABLET BY MOUTH ONCE DAILY. 90 tablet 0   vitamin B-12 (CYANOCOBALAMIN) 1000 MCG tablet Take 1,000 mcg by mouth daily.     Vitamin E 400 units TABS Take 400 Units by mouth daily.     No current facility-administered medications for this visit.   No results found.  Review of Systems:   A ROS was performed including pertinent positives and negatives as documented in the HPI.  Physical Exam :   Constitutional: NAD and appears stated age Neurological: Alert and oriented Psych: Appropriate affect and cooperative There were no vitals taken for this visit.   Comprehensive  Musculoskeletal Exam:    Inspection Right Left  Skin No atrophy or gross abnormalities appreciated No atrophy or gross abnormalities appreciated  Palpation    Tenderness None None  Crepitus None None  Range of Motion    Flexion (passive) 120 120  Extension 30 30  IR 30 with pain 40  ER 45  45  Strength    Flexion  5/5 5/5  Extension 5/5 5/5  Special Tests    FABER Negative Negative  FADIR Negative Negative  ER Lag/Capsular Insufficiency Negative Negative  Instability Negative Negative  Sacroiliac pain Negative  Negative   Instability    Generalized Laxity No No  Neurologic    sciatic, femoral, obturator nerves intact to light sensation  Vascular/Lymphatic    DP pulse 2+ 2+  Lumbar Exam    Patient has symmetric lumbar range of motion with negative pain referral to hip     Imaging:   Xray (4 views right hip): Very mild degenerative findings with some ossification of the labrum  MRI (right hip): There does appear to be a degenerative anterior superior labral tear with 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  I personally reviewed and interpreted the radiographs.   Assessment:   73 y.o. 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  Given her overall mild findings in addition to the labral tear I do believe that physical therapy and specifically aquatic therapy for some dynamic strengthening exercises would help her dramatically to help her long-term pain.  She did initially get very good relief from the injection but I do believe that an additional injection could help to maximize her ability to work through physical therapy and her dynamic stabilizers including the abductors.  Will plan to proceed with this.  I will plan to see her back in 3 weeks to assess efficacy of the injection.  Plan :    -Right hip femoral acetabular injection as well as physical therapy ordered for  aquatic therapy and dynamic hip strengthening     I personally saw and evaluated the patient, and participated in the management and treatment plan.  Huel Cote, MD Attending Physician, Orthopedic Surgery  This document was dictated using Dragon voice recognition software. A reasonable attempt at proof reading has been made to minimize errors.

## 2023-03-19 ENCOUNTER — Other Ambulatory Visit: Payer: Self-pay | Admitting: Family Medicine

## 2023-03-20 ENCOUNTER — Ambulatory Visit
Admission: RE | Admit: 2023-03-20 | Discharge: 2023-03-20 | Disposition: A | Discharge status: Home / Self Care | Payer: Medicare Other | Source: Ambulatory Visit | Attending: Family Medicine | Admitting: Family Medicine

## 2023-03-20 DIAGNOSIS — Z1231 Encounter for screening mammogram for malignant neoplasm of breast: Secondary | ICD-10-CM | POA: Diagnosis not present

## 2023-03-27 ENCOUNTER — Other Ambulatory Visit: Payer: Self-pay | Admitting: Family Medicine

## 2023-03-27 DIAGNOSIS — M25551 Pain in right hip: Secondary | ICD-10-CM

## 2023-04-02 ENCOUNTER — Ambulatory Visit (HOSPITAL_BASED_OUTPATIENT_CLINIC_OR_DEPARTMENT_OTHER): Payer: Medicare Other | Admitting: Orthopaedic Surgery

## 2023-04-05 ENCOUNTER — Other Ambulatory Visit: Payer: Self-pay | Admitting: Family Medicine

## 2023-04-05 DIAGNOSIS — M25551 Pain in right hip: Secondary | ICD-10-CM

## 2023-04-08 ENCOUNTER — Other Ambulatory Visit: Payer: Self-pay | Admitting: Family Medicine

## 2023-04-08 DIAGNOSIS — M25551 Pain in right hip: Secondary | ICD-10-CM

## 2023-04-10 ENCOUNTER — Other Ambulatory Visit: Payer: Self-pay | Admitting: Family Medicine

## 2023-04-12 ENCOUNTER — Other Ambulatory Visit: Payer: Self-pay | Admitting: Family Medicine

## 2023-04-15 ENCOUNTER — Emergency Department (HOSPITAL_COMMUNITY): Payer: Medicare Other

## 2023-04-15 ENCOUNTER — Emergency Department (HOSPITAL_COMMUNITY)
Admission: EM | Admit: 2023-04-15 | Discharge: 2023-04-16 | Disposition: A | Discharge status: Home / Self Care | Payer: Medicare Other | Attending: Emergency Medicine | Admitting: Emergency Medicine

## 2023-04-15 ENCOUNTER — Encounter (HOSPITAL_COMMUNITY): Payer: Self-pay | Admitting: Emergency Medicine

## 2023-04-15 ENCOUNTER — Other Ambulatory Visit: Payer: Self-pay

## 2023-04-15 DIAGNOSIS — Z79899 Other long term (current) drug therapy: Secondary | ICD-10-CM | POA: Diagnosis not present

## 2023-04-15 DIAGNOSIS — R55 Syncope and collapse: Secondary | ICD-10-CM | POA: Insufficient documentation

## 2023-04-15 DIAGNOSIS — D649 Anemia, unspecified: Secondary | ICD-10-CM | POA: Insufficient documentation

## 2023-04-15 DIAGNOSIS — R42 Dizziness and giddiness: Secondary | ICD-10-CM | POA: Diagnosis not present

## 2023-04-15 DIAGNOSIS — R531 Weakness: Secondary | ICD-10-CM | POA: Diagnosis not present

## 2023-04-15 DIAGNOSIS — R0789 Other chest pain: Secondary | ICD-10-CM | POA: Diagnosis not present

## 2023-04-15 DIAGNOSIS — I10 Essential (primary) hypertension: Secondary | ICD-10-CM | POA: Diagnosis not present

## 2023-04-15 LAB — TROPONIN I (HIGH SENSITIVITY): Troponin I (High Sensitivity): 3 ng/L (ref <18)

## 2023-04-15 LAB — CBC WITH DIFFERENTIAL/PLATELET
Abs Immature Granulocytes: 0.01 10*3/uL (ref 0.00–0.07)
Basophils Absolute: 0 10*3/uL (ref 0.0–0.1)
Basophils Relative: 0 %
Eosinophils Absolute: 0.1 10*3/uL (ref 0.0–0.5)
Eosinophils Relative: 2 %
HCT: 35.4 % — ABNORMAL LOW (ref 36.0–46.0)
Hemoglobin: 11.9 g/dL — ABNORMAL LOW (ref 12.0–15.0)
Immature Granulocytes: 0 %
Lymphocytes Relative: 50 %
Lymphs Abs: 3 10*3/uL (ref 0.7–4.0)
MCH: 31.2 pg (ref 26.0–34.0)
MCHC: 33.6 g/dL (ref 30.0–36.0)
MCV: 92.7 fL (ref 80.0–100.0)
Monocytes Absolute: 0.4 10*3/uL (ref 0.1–1.0)
Monocytes Relative: 7 %
Neutro Abs: 2.5 10*3/uL (ref 1.7–7.7)
Neutrophils Relative %: 41 %
Platelets: 291 10*3/uL (ref 150–400)
RBC: 3.82 MIL/uL — ABNORMAL LOW (ref 3.87–5.11)
RDW: 13.8 % (ref 11.5–15.5)
WBC: 6.1 10*3/uL (ref 4.0–10.5)
nRBC: 0 % (ref 0.0–0.2)

## 2023-04-15 LAB — COMPREHENSIVE METABOLIC PANEL
ALT: 59 U/L — ABNORMAL HIGH (ref 0–44)
AST: 41 U/L (ref 15–41)
Albumin: 3.7 g/dL (ref 3.5–5.0)
Alkaline Phosphatase: 39 U/L (ref 38–126)
Anion gap: 7 (ref 5–15)
BUN: 10 mg/dL (ref 8–23)
CO2: 26 mmol/L (ref 22–32)
Calcium: 9.6 mg/dL (ref 8.9–10.3)
Chloride: 103 mmol/L (ref 98–111)
Creatinine, Ser: 0.83 mg/dL (ref 0.44–1.00)
GFR, Estimated: >60 mL/min (ref >60)
Glucose, Bld: 107 mg/dL — ABNORMAL HIGH (ref 70–99)
Potassium: 3.7 mmol/L (ref 3.5–5.1)
Sodium: 136 mmol/L (ref 135–145)
Total Bilirubin: 0.6 mg/dL (ref 0.3–1.2)
Total Protein: 7.4 g/dL (ref 6.5–8.1)

## 2023-04-15 LAB — CK: Total CK: 182 U/L (ref 38–234)

## 2023-04-15 LAB — CBG MONITORING, ED: Glucose-Capillary: 113 mg/dL — ABNORMAL HIGH (ref 70–99)

## 2023-04-15 MED ORDER — IOHEXOL 350 MG/ML SOLN
75.0000 mL | Freq: Once | INTRAVENOUS | Status: AC | PRN
  Administered 2023-04-15: 75 mL via INTRAVENOUS

## 2023-04-15 MED ORDER — SODIUM CHLORIDE 0.9 % IV BOLUS
500.0000 mL | Freq: Once | INTRAVENOUS | Status: AC
  Administered 2023-04-15: 500 mL via INTRAVENOUS

## 2023-04-15 MED ORDER — SODIUM CHLORIDE 0.9 % IV SOLN: INTRAVENOUS | Status: DC

## 2023-04-15 NOTE — ED Notes (Signed)
Patient transported to CT 

## 2023-04-15 NOTE — ED Notes (Signed)
Pt thinks that she stayed outside too long today. Is alert and oriented said that her head hurts a little.

## 2023-04-15 NOTE — ED Triage Notes (Signed)
Pt had syncopal episode at home outside. Ems states she did have some urinary incontinence with the episode.

## 2023-04-16 ENCOUNTER — Ambulatory Visit (HOSPITAL_BASED_OUTPATIENT_CLINIC_OR_DEPARTMENT_OTHER): Payer: Medicare Other | Admitting: Orthopaedic Surgery

## 2023-04-16 DIAGNOSIS — S73191A Other sprain of right hip, initial encounter: Secondary | ICD-10-CM | POA: Diagnosis not present

## 2023-04-16 LAB — TROPONIN I (HIGH SENSITIVITY): Troponin I (High Sensitivity): 4 ng/L (ref <18)

## 2023-04-16 NOTE — ED Provider Notes (Addendum)
[place] EMERGENCY DEPARTMENT AT [facility] Provider Note   CSN: [id number] Arrival date & time: 04/15/23  2040     History  Chief Complaint  Patient presents with   Loss of Consciousness    Ann MCEUEN is a 72 y.o. female.  Patient brought in by EMS for a syncopal episode that occurred shortly prior to arrival.  Patient arrived to 2044.  Husband was outside working under his truck replacing the transmission and he saw her pass out.  Patient states that she got sudden pain in her right posterior chest area anteriorly with posterior in the back and felt short of breath.  And then went down husband states that she woke up by the time he got out from under the truck and got to her but she did not feel right.  Denied hurting anything with the fall states she just feels a little strange she still gets that pain at times and she feels short of breath at times.  But no complaint upper extremity lower extremity neck no concern for head injury.  Past medical history sniffing for hyperlipidemia anxiety gastroesophageal reflux disease osteoarthritis back pain with radiation in 2013 history of glaucoma.  Past surgical history significant for partial hysterectomy.  Patient is never used tobacco products.  Patient's vital signs on arrival heart rate was around 63 no fevers initial blood pressure was 172/78.  But it is come down nicely to 129/78 oxygen saturations in mid to upper 90s.       Home Medications Prior to Admission medications   Medication Sig Start Date End Date Taking? Authorizing Provider  amLODipine (NORVASC) 5 MG tablet TAKE ONE TABLET BY MOUTH ONCE DAILY. 03/20/23   Kerri Perches, MD  Ascorbic Acid (VITAMIN C PO) Take 1 tablet by mouth daily. Power C    [provider]  citalopram (CELEXA) 40 MG tablet TAKE 1 TABLET BY MOUTH ONCE DAILY. 03/03/23   Kerri Perches, MD  clotrimazole-betamethasone (LOTRISONE) cream Apply 1 application topically 2 (two) times  daily. 11/20/21   Kerri Perches, MD  ELDERBERRY PO Take 2 capsules by mouth daily.    [provider]  fluticasone (FLONASE) 50 MCG/ACT nasal spray Place 2 sprays into both nostrils daily. Can do in the morning, or do one spray in more and one at night. 11/23/21   Kerri Perches, MD  gabapentin (NEURONTIN) 300 MG capsule TAKE (1) CAPSULE BY MOUTH AT BEDTIME. 04/10/23   Kerri Perches, MD  loratadine (ALLERGY RELIEF) 10 MG tablet TAKE (1) TABLET BY MOUTH ONCE DAILY AS NEEDED FOR ALLERGIES. 03/06/23   Kerri Perches, MD  Omega-3 Fatty Acids (FISH OIL CONCENTRATE PO) Take 1,576 mg by mouth daily. 788 mg each    [provider]  oxybutynin (DITROPAN) 5 MG tablet TAKE (1) TABLET BY MOUTH TWICE DAILY. 03/20/23   Kerri Perches, MD  pantoprazole (PROTONIX) 20 MG tablet TAKE 1 TABLET BY MOUTH ONCE DAILY. 04/14/23   Kerri Perches, MD  rosuvastatin (CRESTOR) 40 MG tablet TAKE 1 TABLET BY MOUTH ONCE DAILY. 03/03/23 04/02/23  Kerri Perches, MD  vitamin B-12 (CYANOCOBALAMIN) 1000 MCG tablet Take 1,000 mcg by mouth daily.    [provider]  Vitamin E 400 units TABS Take 400 Units by mouth daily.    [provider]      Allergies    Ibuprofen    Review of Systems   Review of Systems  Constitutional:  Negative  for chills and fever.  HENT:  Negative for congestion, ear pain and sore throat.   Eyes:  Negative for pain and visual disturbance.  Respiratory:  Positive for shortness of breath. Negative for cough.   Cardiovascular:  Negative for chest pain and palpitations.  Gastrointestinal:  Negative for abdominal pain and vomiting.  Genitourinary:  Negative for dysuria and hematuria.  Musculoskeletal:  Positive for back pain. Negative for arthralgias.  Skin:  Negative for color change and rash.  Neurological:  Positive for syncope. Negative for dizziness, seizures, speech difficulty, weakness, light-headedness, numbness and headaches.  All other  systems reviewed and are negative.   Physical Exam Updated Vital Signs BP (!) 158/86   Pulse 60   Temp 98.1 F (36.7 C)   Resp 14   Ht 1.651 m (5\' 5" )   Wt 73 kg   SpO2 98%   BMI 26.78 kg/m  Physical Exam Vitals and nursing note reviewed.  Constitutional:      General: She is not in acute distress.    Appearance: Normal appearance. She is well-developed.  HENT:     Head: Normocephalic and atraumatic.  Eyes:     Conjunctiva/sclera: Conjunctivae normal.     Pupils: Pupils are equal, round, and reactive to light.  Cardiovascular:     Rate and Rhythm: Normal rate and regular rhythm.     Heart sounds: No murmur heard. Pulmonary:     Effort: Pulmonary effort is normal. No respiratory distress.     Breath sounds: Normal breath sounds. No wheezing, rhonchi or rales.     Comments: Is to palpation to right posterior chest wall.  Along the back. Chest:     Chest wall: Tenderness present.  Abdominal:     General: There is no distension.     Palpations: Abdomen is soft.     Tenderness: There is no abdominal tenderness. There is no guarding.  Musculoskeletal:        General: No swelling.     Cervical back: Normal range of motion and neck supple. No rigidity.     Right lower leg: No edema.     Left lower leg: No edema.  Skin:    General: Skin is warm and dry.     Capillary Refill: Capillary refill takes less than 2 seconds.  Neurological:     General: No focal deficit present.     Mental Status: She is alert and oriented to person, place, and time.     Cranial Nerves: No cranial nerve deficit.     Sensory: No sensory deficit.     Motor: No weakness.  Psychiatric:        Mood and Affect: Mood normal.     ED Results / Procedures / Treatments   Labs (all labs ordered are listed, but only abnormal results are displayed) Labs Reviewed  CBC WITH DIFFERENTIAL/PLATELET - Abnormal; Notable for the following components:      Result Value   RBC 3.82 (*)    Hemoglobin 11.9 (*)     HCT 35.4 (*)    All other components within normal limits  COMPREHENSIVE METABOLIC PANEL - Abnormal; Notable for the following components:   Glucose, Bld 107 (*)    ALT 59 (*)    All other components within normal limits  CBG MONITORING, ED - Abnormal; Notable for the following components:   Glucose-Capillary 113 (*)    All other components within normal limits  CK  TROPONIN I (HIGH SENSITIVITY)  TROPONIN I (HIGH  SENSITIVITY)    EKG EKG Interpretation  Date/Time:  Tuesday April 15 2023 20:51:50 EDT Ventricular Rate:  69 PR Interval:  187 QRS Duration: 91 QT Interval:  409 QTC Calculation: 439 R Axis:   6 Text Interpretation: Sinus rhythm Abnormal R-wave progression, early transition No significant change since last tracing Confirmed by Vanetta Mulders [phone]) on 04/15/2023 10:41:59 PM  Radiology CT Angio Chest PE W/Cm &/Or Wo Cm  Result Date: 04/15/2023 CLINICAL DATA:  Syncopal episode at home earlier today. EXAM: CT ANGIOGRAPHY CHEST WITH CONTRAST TECHNIQUE: Multidetector CT imaging of the chest was performed using the standard protocol during bolus administration of intravenous contrast. Multiplanar CT image reconstructions and MIPs were obtained to evaluate the vascular anatomy. RADIATION DOSE REDUCTION: This exam was performed according to the departmental dose-optimization program which includes automated exposure control, adjustment of the mA and/or kV according to patient size and/or use of iterative reconstruction technique. CONTRAST:  75mL OMNIPAQUE IOHEXOL 350 MG/ML SOLN COMPARISON:  Chest radiographs 04/15/2023 FINDINGS: Cardiovascular: Satisfactory opacification of the pulmonary arteries to the segmental level. No evidence of pulmonary embolism. Normal heart size. No pericardial effusion. Mediastinum/Nodes: No enlarged mediastinal, hilar, or axillary lymph nodes. Thyroid gland, trachea, and esophagus demonstrate no significant findings. Lungs/Pleura: Bibasilar scarring. No  focal consolidation no pleural effusion or pneumothorax. Upper Abdomen: No acute abnormality. Musculoskeletal: No chest wall abnormality. No acute osseous findings. Review of the MIP images confirms the above findings. IMPRESSION: No evidence of pulmonary embolism or acute intrathoracic process. Electronically Signed   By: Minerva Fester M.D.   On: 04/15/2023 23:30   DG Chest 2 View  Result Date: 04/15/2023 CLINICAL DATA:  Recent syncopal episode EXAM: CHEST - 2 VIEW COMPARISON:  02/25/2021 FINDINGS: The heart size and mediastinal contours are within normal limits. Both lungs are clear. The visualized skeletal structures are unremarkable. IMPRESSION: No active cardiopulmonary disease. Electronically Signed   By: Alcide Clever M.D.   On: 04/15/2023 22:18    Procedures Procedures    Medications Ordered in ED Medications  0.9 %  sodium chloride infusion ( Intravenous New Bag/Given 04/15/23 2258)  sodium chloride 0.9 % bolus 500 mL (0 mLs Intravenous Stopped 04/16/23 0019)  iohexol (OMNIPAQUE) 350 MG/ML injection 75 mL (75 mLs Intravenous Contrast Given 04/15/23 2322)    ED Course/ Medical Decision Making/ A&P                             Medical Decision Making Amount and/or Complexity of Data Reviewed Labs: ordered. Radiology: ordered.  Risk Prescription drug management.   CBC no leukocytosis hemoglobin 11.9 platelets 291.  Complete metabolic panel electrolytes without any significant abnormalities renal function was normal.  Total bili was good at 0.6.  Initial troponin was 3 delta troponin pending.  Did a CK because patient did state she was outside in the heat all day that was 182.  CT angio chest was done to rule out PE and get a closer look chest x-ray had no acute cardiopulmonary disease.  And the CT angio chest showed no evidence of pulm embolisms or acute intrathoracic process.  If delta troponin is not seem elevated then patient is stable for discharge home with follow-up with her  doctor.  Delta troponin without significant change it was 4.  Patient stable for discharge home and follow-up with cardiology and her doctor   Final Clinical Impression(s) / ED Diagnoses Final diagnoses:  Syncope and collapse  Atypical chest pain  Rx / DC Orders ED Discharge Orders     None         Vanetta Mulders, MD 04/16/23 4098    Vanetta Mulders, MD 04/16/23 [phone]

## 2023-04-16 NOTE — Discharge Instructions (Addendum)
Return for any new or worse symptoms.  CT chest showed no evidence of any blood clots or any abnormalities.  Labs without significant abnormalities.  No evidence of any significant injury from heat.  Return for any recurrent passing out.  Make an appointment follow-up with your primary care doctor and make an appointment to follow-up with cardiology.

## 2023-04-16 NOTE — Progress Notes (Signed)
Chief Complaint: Right hip pain     History of Present Illness:   04/16/2023: Presents today for follow-up.  She states that she did get approximately 2 months of extremely good relief from her injection.  This has been overall feeling much better.  That being said her pain is now recurred and is still persistent about the groin.  She is attempting to remain active although this has been quite limiting for her.   [name] is a 72 y.o. female with right hip joint which has been going on for the last 3 to 4 months.  She is here today for further discussion.  She has been seeing Dr. Thane Edu this for this and performed an injection of the right hip.  She states that she got approximately 1 relief of good relief from this.  She has been experiencing swelling in the right lower extremity which does correlate somewhat with activity level.  She is continuing to be active and she is taking Mobic to relieve her pain.  This does help somewhat.  She has worse pain at the end of the day.    Surgical History:   None  PMH/PSH/Family History/Social History/Meds/Allergies:    Past Medical History:  Diagnosis Date   Allergy    Anxiety    Back pain with radiation 07/13/2012   GERD (gastroesophageal reflux disease)    Glaucoma    Hyperlipidemia    Osteoarthritis    Past Surgical History:  Procedure Laterality Date   BREAST CYST EXCISION Right    COLONOSCOPY WITH PROPOFOL N/A 01/02/2022   Procedure: COLONOSCOPY WITH PROPOFOL;  Surgeon: Dolores Frame, MD;  [facility];  Service: Gastroenterology;  Laterality: N/A;  1130   cyst removed from right breast     LAPAROSCOPIC SALPINGO OOPHERECTOMY Right 04/09/2017   Procedure: LAPAROSCOPIC RIGHT SALPINGO OOPHORECTOMY; RIGHT OVARIAN BENIGN CYSTIC TERATOMA;  Surgeon: Lazaro Arms, MD;  Location: [facility];  Service: Gynecology;  Laterality: Right;   PARTIAL HYSTERECTOMY     TUBAL LIGATION      Social History   Socioeconomic History   Marital status: Single    Spouse name: Not on file   Number of children: 3   Years of education: 65   Highest education level: 12th grade  Occupational History   Not on file  Tobacco Use   Smoking status: Never   Smokeless tobacco: Never  Vaping Use   Vaping Use: Never used  Substance and Sexual Activity   Alcohol use: Not Currently    Comment: occasionally wine   Drug use: No   Sexual activity: Not Currently    Birth control/protection: Surgical    Comment: hyst  Other Topics Concern   Not on file  Social History Narrative   Not on file   Social Determinants of Health   Financial Resource Strain: Low Risk  (01/17/2023)   Overall Financial Resource Strain (CARDIA)    Difficulty of Paying Living Expenses: Not hard at all  Food Insecurity: No Food Insecurity (01/17/2023)   Hunger Vital Sign    Worried About Running Out of Food in the Last Year: Never true    Ran Out of Food in the Last Year: Never true  Transportation Needs: No Transportation Needs (01/17/2023)   PRAPARE - Transportation  Lack of Transportation (Medical): No    Lack of Transportation (Non-Medical): No  Physical Activity: Sufficiently Active (01/17/2023)   Exercise Vital Sign    Days of Exercise per Week: 7 days    Minutes of Exercise per Session: 60 min  Stress: Stress Concern Present (01/17/2023)   [employer] of Occupational Health - Occupational Stress Questionnaire    Feeling of Stress : To some extent  Social Connections: Moderately Integrated (01/17/2023)   Social Connection and Isolation Panel [NHANES]    Frequency of Communication with Friends and Family: More than three times a week    Frequency of Social Gatherings with Friends and Family: More than three times a week    Attends Religious Services: More than 4 times per year    Active Member of [employer] or Organizations: Yes    Attends Engineer, structural: More than 4 times per year     Marital Status: Patient unable to answer   Family History  Problem Relation Age of Onset   Heart disease Mother    Heart disease Father    Diabetes Father    Hypertension Sister    Parkinson's disease Brother    Heart disease Maternal Grandmother    Cancer Paternal Grandmother        breast   Diabetes Paternal Grandfather    Hypertension Sister    Hypertension Sister    Down syndrome Son    Eczema Son    Post-traumatic stress disorder Daughter    Allergies  Allergen Reactions   Ibuprofen Palpitations and Other (See Comments)    Rapid heart rate.   Current Outpatient Medications  Medication Sig Dispense Refill   amLODipine (NORVASC) 5 MG tablet TAKE ONE TABLET BY MOUTH ONCE DAILY. 30 tablet 0   Ascorbic Acid (VITAMIN C PO) Take 1 tablet by mouth daily. Power C     citalopram (CELEXA) 40 MG tablet TAKE 1 TABLET BY MOUTH ONCE DAILY. 30 tablet 0   clotrimazole-betamethasone (LOTRISONE) cream Apply 1 application topically 2 (two) times daily. 45 g 1   ELDERBERRY PO Take 2 capsules by mouth daily.     fluticasone (FLONASE) 50 MCG/ACT nasal spray Place 2 sprays into both nostrils daily. Can do in the morning, or do one spray in more and one at night. 16 g 2   gabapentin (NEURONTIN) 300 MG capsule TAKE (1) CAPSULE BY MOUTH AT BEDTIME. 90 capsule 0   loratadine (ALLERGY RELIEF) 10 MG tablet TAKE (1) TABLET BY MOUTH ONCE DAILY AS NEEDED FOR ALLERGIES. 90 tablet 0   Omega-3 Fatty Acids (FISH OIL CONCENTRATE PO) Take 1,576 mg by mouth daily. 788 mg each     oxybutynin (DITROPAN) 5 MG tablet TAKE (1) TABLET BY MOUTH TWICE DAILY. 60 tablet 0   pantoprazole (PROTONIX) 20 MG tablet TAKE 1 TABLET BY MOUTH ONCE DAILY. 30 tablet 0   rosuvastatin (CRESTOR) 40 MG tablet TAKE 1 TABLET BY MOUTH ONCE DAILY. 90 tablet 0   vitamin B-12 (CYANOCOBALAMIN) 1000 MCG tablet Take 1,000 mcg by mouth daily.     Vitamin E 400 units TABS Take 400 Units by mouth daily.     No current facility-administered  medications for this visit.   CT Angio Chest PE W/Cm &/Or Wo Cm  Result Date: 04/15/2023 CLINICAL DATA:  Syncopal episode at home earlier today. EXAM: CT ANGIOGRAPHY CHEST WITH CONTRAST TECHNIQUE: Multidetector CT imaging of the chest was performed using the standard protocol during bolus administration of intravenous contrast. Multiplanar CT image  reconstructions and MIPs were obtained to evaluate the vascular anatomy. RADIATION DOSE REDUCTION: This exam was performed according to the departmental dose-optimization program which includes automated exposure control, adjustment of the mA and/or kV according to patient size and/or use of iterative reconstruction technique. CONTRAST:  75mL OMNIPAQUE IOHEXOL 350 MG/ML SOLN COMPARISON:  Chest radiographs 04/15/2023 FINDINGS: Cardiovascular: Satisfactory opacification of the pulmonary arteries to the segmental level. No evidence of pulmonary embolism. Normal heart size. No pericardial effusion. Mediastinum/Nodes: No enlarged mediastinal, hilar, or axillary lymph nodes. Thyroid gland, trachea, and esophagus demonstrate no significant findings. Lungs/Pleura: Bibasilar scarring. No focal consolidation no pleural effusion or pneumothorax. Upper Abdomen: No acute abnormality. Musculoskeletal: No chest wall abnormality. No acute osseous findings. Review of the MIP images confirms the above findings. IMPRESSION: No evidence of pulmonary embolism or acute intrathoracic process. Electronically Signed   By: Minerva Fester M.D.   On: 04/15/2023 23:30   DG Chest 2 View  Result Date: 04/15/2023 CLINICAL DATA:  Recent syncopal episode EXAM: CHEST - 2 VIEW COMPARISON:  02/25/2021 FINDINGS: The heart size and mediastinal contours are within normal limits. Both lungs are clear. The visualized skeletal structures are unremarkable. IMPRESSION: No active cardiopulmonary disease. Electronically Signed   By: Alcide Clever M.D.   On: 04/15/2023 22:18    Review of Systems:   A ROS was  performed including pertinent positives and negatives as documented in the HPI.  Physical Exam :   Constitutional: NAD and appears stated age Neurological: Alert and oriented Psych: Appropriate affect and cooperative There were no vitals taken for this visit.   Comprehensive Musculoskeletal Exam:    Inspection Right Left  Skin No atrophy or gross abnormalities appreciated No atrophy or gross abnormalities appreciated  Palpation    Tenderness None None  Crepitus None None  Range of Motion    Flexion (passive) 120 120  Extension 30 30  IR 30 with pain 40  ER 45 45  Strength    Flexion  5/5 5/5  Extension 5/5 5/5  Special Tests    FABER Negative Negative  FADIR Negative Negative  ER Lag/Capsular Insufficiency Negative Negative  Instability Negative Negative  Sacroiliac pain Negative  Negative   Instability    Generalized Laxity No No  Neurologic    sciatic, femoral, obturator nerves intact to light sensation  Vascular/Lymphatic    DP pulse 2+ 2+  Lumbar Exam    Patient has symmetric lumbar range of motion with negative pain referral to hip     Imaging:   Xray (4 views right hip): Very mild degenerative findings with some ossification of the labrum  MRI (right hip): There does appear to be a degenerative anterior superior labral tear with 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  I personally reviewed and interpreted the radiographs.   Assessment:   72 y.o. 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  I did discuss that overall I do believe that physical therapy would be a good initial approach and she did get very good relief from the injection.  We did briefly discuss that if she does not continue to improve with physical therapy that ultimately she may be a candidate for total hip arthroplasty.  I did describe that given her early degenerative findings about the joint  that I would not be confident that any type of hip arthroscopy would give her more than 1 to 2 years of persistent relief and  to that effect I think ultimately hip arthroplasty would be the most definitive treatment of her pain.  That being said we will begin with physical therapy and I will plan to see her back in 2 months for reassessment  Plan :    -Return to clinic in 2 months for reassessment     I personally saw and evaluated the patient, and participated in the management and treatment plan.  Huel Cote, MD Attending Physician, Orthopedic Surgery  This document was dictated using Dragon voice recognition software. A reasonable attempt at proof reading has been made to minimize errors.

## 2023-04-17 ENCOUNTER — Other Ambulatory Visit: Payer: Self-pay | Admitting: Family Medicine

## 2023-04-17 DIAGNOSIS — F4323 Adjustment disorder with mixed anxiety and depressed mood: Secondary | ICD-10-CM

## 2023-04-21 ENCOUNTER — Ambulatory Visit: Payer: Medicare Other | Admitting: Orthopaedic Surgery

## 2023-04-22 ENCOUNTER — Other Ambulatory Visit: Payer: Self-pay | Admitting: Family Medicine

## 2023-04-22 ENCOUNTER — Telehealth: Payer: Self-pay | Admitting: Family Medicine

## 2023-04-22 ENCOUNTER — Other Ambulatory Visit: Payer: Self-pay

## 2023-04-22 MED ORDER — AMLODIPINE BESYLATE 5 MG PO TABS: 5.0000 mg | ORAL_TABLET | Freq: Every day | ORAL | 0 refills | Status: DC

## 2023-04-22 NOTE — Telephone Encounter (Signed)
Patient came by office wants a call back from Dr. Lodema Hong. Did not specify what call back was in regard to.

## 2023-04-22 NOTE — Telephone Encounter (Signed)
Refills sent

## 2023-04-22 NOTE — Telephone Encounter (Signed)
Prescription Request  04/22/2023  LOV: 02/27/2023  What is the name of the medication or equipment? amLODipine (NORVASC) 5 MG tablet   Have you contacted your pharmacy to request a refill? Yes   Which pharmacy would you like this sent to?    [facility] - [place], [place] - [address] [place] [address] Phone: [phone] Fax: [phone]    Patient notified that their request is being sent to the clinical staff for review and that they should receive a response within 2 business days.   Please advise at Mobile [phone] (mobile)

## 2023-04-23 ENCOUNTER — Telehealth: Payer: Self-pay

## 2023-04-23 NOTE — Telephone Encounter (Signed)
Transition Care Management Unsuccessful Follow-up Telephone Call  Date of discharge and from where:  04/16/2023 [facility]  Attempts:  1st Attempt  Reason for unsuccessful TCM follow-up call:  No answer/busy  Marleta Lapierre [facility]  [facility] Population Health Community Resource Care Guide   [email or web address]  ?? [phone]   Website: [email or web address]  [email or web address]

## 2023-04-24 ENCOUNTER — Telehealth: Payer: Self-pay

## 2023-04-24 NOTE — Telephone Encounter (Signed)
Transition Care Management Unsuccessful Follow-up Telephone Call  Date of discharge and from where:  04/16/2023 [facility]  Attempts:  2nd Attempt  Reason for unsuccessful TCM follow-up call:  No answer/busy  Diandre Merica [facility]  [facility] Population Health Community Resource Care Guide   [email or web address]  ?? [phone]   Website: [email or web address]  [email or web address]

## 2023-04-28 NOTE — Therapy (Signed)
OUTPATIENT PHYSICAL THERAPY LOWER EXTREMITY EVALUATION   Patient Name: [name] MRN: [id number] DOB:May 21, 1951, 72 y.o., female Today's Date: 04/30/2023  END OF SESSION:  PT End of Session - 04/29/23 1006     Visit Number 1    Number of Visits 10    Date for PT Re-Evaluation 06/24/23    Authorization Type BCBS MCR    PT Start Time 0940    PT Stop Time 1034    PT Time Calculation (min) 54 min    Activity Tolerance Patient tolerated treatment well    Behavior During Therapy [facility] for tasks assessed/performed             Past Medical History:  Diagnosis Date   Allergy    Anxiety    Back pain with radiation 07/13/2012   GERD (gastroesophageal reflux disease)    Glaucoma    Hyperlipidemia    Osteoarthritis    Past Surgical History:  Procedure Laterality Date   BREAST CYST EXCISION Right    COLONOSCOPY WITH PROPOFOL N/A 01/02/2022   Procedure: COLONOSCOPY WITH PROPOFOL;  Surgeon: Dolores Frame, MD;  [facility];  Service: Gastroenterology;  Laterality: N/A;  1130   cyst removed from right breast     LAPAROSCOPIC SALPINGO OOPHERECTOMY Right 04/09/2017   Procedure: LAPAROSCOPIC RIGHT SALPINGO OOPHORECTOMY; RIGHT OVARIAN BENIGN CYSTIC TERATOMA;  Surgeon: Lazaro Arms, MD;  Location: [facility];  Service: Gynecology;  Laterality: Right;   PARTIAL HYSTERECTOMY     TUBAL LIGATION     Patient Active Problem List   Diagnosis Date Noted   Sciatica of right side 02/27/2023   Osteoarthritis of hips, bilateral 09/24/2022   Cervicalgia 09/25/2020   Seasonal allergies 02/18/2020   Depression, major, single episode, in partial remission (HCC) 10/29/2019   Essential hypertension 01/01/2018   Prediabetes 03/30/2015   Annual physical exam 04/08/2014   Right hip pain 04/06/2014   Hyperlipemia 06/22/2008   GERD (gastroesophageal reflux disease) 05/24/2008   Allergic rhinitis 01/08/2008   OVERACTIVE BLADDER 04/10/2007   GAD (generalized anxiety disorder)  08/22/2006   OSTEOARTHRITIS 08/22/2006    REFERRING PROVIDER:  Huel Cote, MD  REFERRING DIAG: [phone] (ICD-10-CM) - Tear of right acetabular labrum, initial encounter  THERAPY DIAG:  Pain in right hip  Stiffness of right hip, not elsewhere classified  Muscle weakness (generalized)  Rationale for Evaluation and Treatment: Rehabilitation  ONSET DATE: November/December 2023  SUBJECTIVE:   SUBJECTIVE STATEMENT: Pt began having R sided groin pain in Nov/Dec with no specific MOI.  Pt states her pain traveled down R LE and she had stiffness.  Pt received a R hip injection earlier this year which helped for a couple of weeks and then the pain returned.  Pt received PT from Jan to February which helped a little bit.  Pt states she had pain with home exercises and stopped her HEP.  She continued to have pain and MD referred pt to Dr. Veneta Penton.  Pt saw Dr. Steward Drone on 03/12/23 and received an injection.  Pt reports having significant pain relief from that injection, but the pain returned about 1 week ago.  MD ordered PT.   PT order indicates Candidate for aquatic therapy, R hip strengthening ROM program.  Pt states MD discussed with her the surgical options if she is not improving with PT.    Pt has increased pain with "making a wrong turn" when she is walking.  Pt has increased pain with pivoting/turning especially in the kitchen.  Pt  has disturbed sleep due to pain and has difficulty with getting comfortable.  Pt is limited with lifting objects.  Pt states her R LE started feeling like it wants to give way on her about 2 weeks ago.  This happens occasionally.   Pt states she is not limited with ambulation due to R hip.  Pt does not have pain with stairs.   Pt states she likes to be outside including walking outside, cleaning her yard, and planting flowers.     PERTINENT HISTORY: Anxiety Back pain, lumbar spondylosis OA  PAIN:  Are you having pain? Yes NPRS:  6/10 current, >10/10  worst, 4/10 best Worst pain is with sudden turning Location:  R sided groin which can travel around to post hip, anterior thigh  PRECAUTIONS: Other: per dx  WEIGHT BEARING RESTRICTIONS: No  FALLS:  Has patient fallen in last 6 months? Yes. Number of falls 1 fall when she fainted  LIVING ENVIRONMENT: Lives with: lives with boyfriend and son Lives in: 1 story home Stairs: 4 steps to enter home with bilat rails, 8 steps on deck with bilat rails Has following equipment at home: None   PLOF: Independent  PATIENT GOALS: decrease pain, to be able to turn without pain, improved sleeping   OBJECTIVE:   DIAGNOSTIC FINDINGS:  (Per Epic) R hip MRI: IMPRESSION: 1. Mild degenerative changes at both hips with a small right hip joint effusion. No acute osseous findings. 2. Mild edema within the right hip adductor muscles, likely muscular strain. 3. 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 4. Lower lumbar spondylosis.  R hip x ray: IMPRESSION: Mild bilateral femoroacetabular osteoarthritis.   (Per MD note) R hip X ray: Very mild degenerative findings with some ossification of the labrum   R hip MRI: There does appear to be a degenerative anterior superior labral tear with 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  PATIENT SURVEYS:  FOTO 58 with a goal of 66 at visit 12  COGNITION: Overall cognitive status: Within functional limits for tasks assessed      LOWER EXTREMITY ROM:  Active ROM Right eval Left eval  Hip flexion 84 102  Hip extension    Hip abduction Pt unable to perform in supine 28  Hip adduction    Hip internal rotation    Hip external rotation 18 26  Knee flexion    Knee extension    Ankle dorsiflexion    Ankle plantarflexion    Ankle inversion    Ankle eversion     (Blank rows = not tested)  LOWER EXTREMITY MMT:  MMT Right eval Left eval  Hip flexion 4/5 5/5  Hip extension  Tolerated min resistance with min pain 4/5  Hip abduction 15.7 21.5 ; 5/5  Hip adduction    Hip internal rotation    Hip external rotation 4+/5 w/n available ROM; painful 5/5 w/n available ROM  Knee flexion 4/5 5/5  Knee extension 4+/5 5/5  Ankle dorsiflexion    Ankle plantarflexion    Ankle inversion    Ankle eversion     (Blank rows = not tested)   FUNCTIONAL TESTS:  5x STS test:  22.45  GAIT: Comments: good heel to toe gait, minimally decreased gait speed, decreased BOS   TODAY'S TREATMENT:  See below for pt education    PATIENT EDUCATION:  Education details: PT educated pt in aquatic process and benefits, properties, and purpose of aquatic therapy.  PT educated pt in POC, objective findings, relevant anatomy, and dx.  Person educated: Patient Education method: Explanation Education comprehension: verbalized understanding  HOME EXERCISE PROGRAM: Pt has a land based HEP.   ASSESSMENT:  CLINICAL IMPRESSION: Patient is a 72 y.o. female with a dx of R hip labral tear presenting to the clinic with R hip pain, limited ROM in R hip, and muscle weakness in R LE.  Pt has mild degenerative changes in bilat hips per diagnostic testing.  She has received prior land based PT which she states helped a little.  Pt states she stopped doing her HEP due to hip pain.  Pt reports a good improvement in her pain after prior injection though the pain returned last week.  MD orders indicated pt being a candidate for aquatic therapy.  Pt's primary c/o is pain with turning/pivoting.  Pt has disturbed sleep due to pain and has difficulty with getting comfortable.  Pt is limited with lifting objects.  Pt occasionally feels like her leg wants to give way.  Pt states she is not limited with ambulation due to R hip.  Pt may benefit from skilled PT services to address impairments and  improve overall function.     OBJECTIVE IMPAIRMENTS: decreased activity tolerance, decreased mobility, decreased ROM, decreased strength, hypomobility, impaired flexibility, and pain.   ACTIVITY LIMITATIONS: lifting, sleeping, and turning  PARTICIPATION LIMITATIONS: meal prep and cleaning  PERSONAL FACTORS: 1 comorbidity: back pain  are also affecting patient's functional outcome.   REHAB POTENTIAL: Good  CLINICAL DECISION MAKING: Stable/uncomplicated  EVALUATION COMPLEXITY: Low   GOALS:  SHORT TERM GOALS: Target date: 7/23/204  Pt will tolerate aquatic therapy without adverse effects for improved pain, strength, function, and tolerance to activity.  Baseline: Goal status: INITIAL  2.  Pt will report at least a 25% improvement in pain and sx's overall.  Baseline:  Goal status: INITIAL  3.  Pt will report reduced pain at night and improved sleeping.  Baseline:  Goal status: INITIAL Target date:  05/27/2023  4.  Pt will demo improved R hip AROM to at least 10-15 deg in abd for improved mobility.   Goal status:  INITIAL Target date:  05/27/2023    LONG TERM GOALS: Target date: 06/24/2023  Pt will report she is able to perform her normal community ambulation with good stability without her leg wanting to give way.  Baseline:  Goal status: INITIAL  2.  Pt will perform 5x STS test in < 15 sec for improved functional LE strength.  Baseline:  Goal status: INITIAL  3.  Pt will report at least a 70% reduction in pain with turning/pivoting with daily activities.  Baseline:  Goal status: INITIAL  4.  Pt will demo 5/5 strength in R hip flexion and knee ext and 20# with HHD testing for hip abduction for improved tolerance to activity and performance of ADLs/IADLs.  Baseline:  Goal status: INITIAL  5.  Pt will be independent with HEP for improved pain, strength, ROM, and function.  Baseline:  Goal status:  INITIAL    PLAN:  PT FREQUENCY:  2 times per week  PT  DURATION: Probable 5 weeks of PT, but will extend POC to 8 weeks due to clinic availablity  PLANNED INTERVENTIONS: Therapeutic exercises, Therapeutic activity, Neuromuscular re-education, Balance training, Gait training, Patient/Family education,  Self Care, Joint mobilization, Stair training, Aquatic Therapy, Dry Needling, Electrical stimulation, Spinal mobilization, Cryotherapy, Moist heat, Taping, Ultrasound, Manual therapy, and Re-evaluation  PLAN FOR NEXT SESSION: Aquatic therapy for 3 weeks - 4 weeks and then return to land for assessment and exercises.     Audie Clear III PT, DPT 04/30/23 5:26 PM

## 2023-04-29 ENCOUNTER — Other Ambulatory Visit: Payer: Self-pay

## 2023-04-29 ENCOUNTER — Ambulatory Visit (HOSPITAL_BASED_OUTPATIENT_CLINIC_OR_DEPARTMENT_OTHER): Payer: Medicare Other | Attending: Orthopaedic Surgery | Admitting: Physical Therapy

## 2023-04-29 ENCOUNTER — Encounter (HOSPITAL_BASED_OUTPATIENT_CLINIC_OR_DEPARTMENT_OTHER): Payer: Self-pay | Admitting: Physical Therapy

## 2023-04-29 DIAGNOSIS — M25551 Pain in right hip: Secondary | ICD-10-CM | POA: Insufficient documentation

## 2023-04-29 DIAGNOSIS — S73191A Other sprain of right hip, initial encounter: Secondary | ICD-10-CM | POA: Diagnosis not present

## 2023-04-29 DIAGNOSIS — M25651 Stiffness of right hip, not elsewhere classified: Secondary | ICD-10-CM | POA: Insufficient documentation

## 2023-04-29 DIAGNOSIS — M6281 Muscle weakness (generalized): Secondary | ICD-10-CM | POA: Diagnosis not present

## 2023-05-08 ENCOUNTER — Ambulatory Visit (HOSPITAL_BASED_OUTPATIENT_CLINIC_OR_DEPARTMENT_OTHER): Payer: Medicare Other | Admitting: Physical Therapy

## 2023-05-08 DIAGNOSIS — M6281 Muscle weakness (generalized): Secondary | ICD-10-CM | POA: Diagnosis not present

## 2023-05-08 DIAGNOSIS — M25551 Pain in right hip: Secondary | ICD-10-CM

## 2023-05-08 DIAGNOSIS — S73191A Other sprain of right hip, initial encounter: Secondary | ICD-10-CM | POA: Diagnosis not present

## 2023-05-08 DIAGNOSIS — M25651 Stiffness of right hip, not elsewhere classified: Secondary | ICD-10-CM

## 2023-05-08 NOTE — Therapy (Signed)
OUTPATIENT PHYSICAL THERAPY TREATMENT NOTE    Patient Name: [name] MRN: [id number] DOB:Mar 23, 1951, 72 y.o., female Today's Date: 05/09/2023  END OF SESSION:  PT End of Session - 05/08/23 1013     Visit Number 2    Number of Visits 10    Date for PT Re-Evaluation 06/24/23    Authorization Type BCBS MCR    PT Start Time [phone]   started PT late due to pt getting ready for the pool   PT Stop Time 1017    PT Time Calculation (min) 36 min    Activity Tolerance Patient tolerated treatment well    Behavior During Therapy [facility] for tasks assessed/performed              Past Medical History:  Diagnosis Date   Allergy    Anxiety    Back pain with radiation 07/13/2012   GERD (gastroesophageal reflux disease)    Glaucoma    Hyperlipidemia    Osteoarthritis    Past Surgical History:  Procedure Laterality Date   BREAST CYST EXCISION Right    COLONOSCOPY WITH PROPOFOL N/A 01/02/2022   Procedure: COLONOSCOPY WITH PROPOFOL;  Surgeon: Dolores Frame, MD;  Location: [facility];  Service: Gastroenterology;  Laterality: N/A;  1130   cyst removed from right breast     LAPAROSCOPIC SALPINGO OOPHERECTOMY Right 04/09/2017   Procedure: LAPAROSCOPIC RIGHT SALPINGO OOPHORECTOMY; RIGHT OVARIAN BENIGN CYSTIC TERATOMA;  Surgeon: Lazaro Arms, MD;  Location: [facility];  Service: Gynecology;  Laterality: Right;   PARTIAL HYSTERECTOMY     TUBAL LIGATION     Patient Active Problem List   Diagnosis Date Noted   Sciatica of right side 02/27/2023   Osteoarthritis of hips, bilateral 09/24/2022   Cervicalgia 09/25/2020   Seasonal allergies 02/18/2020   Depression, major, single episode, in partial remission (HCC) 10/29/2019   Essential hypertension 01/01/2018   Prediabetes 03/30/2015   Annual physical exam 04/08/2014   Right hip pain 04/06/2014   Hyperlipemia 06/22/2008   GERD (gastroesophageal reflux disease) 05/24/2008   Allergic rhinitis 01/08/2008   OVERACTIVE BLADDER  04/10/2007   GAD (generalized anxiety disorder) 08/22/2006   OSTEOARTHRITIS 08/22/2006    REFERRING PROVIDER:  Huel Cote, MD  REFERRING DIAG: [phone] (ICD-10-CM) - Tear of right acetabular labrum, initial encounter  THERAPY DIAG:  Pain in right hip  Stiffness of right hip, not elsewhere classified  Muscle weakness (generalized)  Rationale for Evaluation and Treatment: Rehabilitation  ONSET DATE: November/December 2023  SUBJECTIVE:   SUBJECTIVE STATEMENT: Pt states she did have some pain after prior Pt appt/eval.  Pt states she has been doing her HEP without significant pain.  Pt has a little irritation with her home exercises.  Pt's R LE gives out on her 3-4 times per week with walking.  Pt has increased pain with ambulating 10 mins.     PERTINENT HISTORY: Anxiety Back pain, lumbar spondylosis OA  PAIN:  Are you having pain? Yes NPRS:  7/10 current, >10/10 worst, 4/10 best Worst pain is with sudden turning Location:  R sided groin  PRECAUTIONS: Other: per dx  WEIGHT BEARING RESTRICTIONS: No  FALLS:  Has patient fallen in last 6 months? Yes. Number of falls 1 fall when she fainted  LIVING ENVIRONMENT: Lives with: lives with boyfriend and son Lives in: 1 story home Stairs: 4 steps to enter home with bilat rails, 8 steps on deck with bilat rails Has following equipment at home: None   PLOF: Independent  PATIENT  GOALS: decrease pain, to be able to turn without pain, improved sleeping   OBJECTIVE:   DIAGNOSTIC FINDINGS:  (Per Epic) R hip MRI: IMPRESSION: 1. Mild degenerative changes at both hips with a small right hip joint effusion. No acute osseous findings. 2. Mild edema within the right hip adductor muscles, likely muscular strain. 3. 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 4. Lower lumbar spondylosis.  R hip x ray: IMPRESSION: Mild bilateral femoroacetabular osteoarthritis.   (Per MD note) R hip X  ray: Very mild degenerative findings with some ossification of the labrum   R hip MRI: There does appear to be a degenerative anterior superior labral tear with 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   TODAY'S TREATMENT:                                                                                                                               Pt seen for aquatic therapy today.  Treatment took place in water 3.5-4 ft in depth at the [facility] pool. Temp of water was 91. Pt entered/exited the pool via stairs independently with bilat rail.   Reviewed response to prior Rx, HEP compliance, pain level, and current function. Introduction to water.  PT educated pt with aquatic properties.    -Pt ambulated fwd and bwd 3 laps each   -Pt performed:     -marching 2 x 10 reps with bilat UE assist on pool edge.     -standing hip abd x10 reps, and standing heel raises 2x10 reps with UE assist on pool edge.      -sidestepped 2 laps     -SLS 3x20 sec     -Sit to stands from aquatic bench 2x10      -LAQ 2x10 reps bilat LE  Pt requires buoyancy for support and to offload joints with strengthening exercises. Viscosity of the water is needed for resistance of strengthening; water current perturbations provides challenge to standing balance unsupported, requiring increased core activation.See below for pt education    PATIENT EDUCATION:  Education details: PT educated pt in aquatic process and benefits, properties, and purpose of aquatic therapy.  POC, relevant anatomy, exercise form, and dx.  Person educated: Patient Education method: Explanation, demonstration, verbal cuing Education comprehension: verbalized understanding, returned demonstration, verbal cuing required  HOME EXERCISE PROGRAM: Pt has a land based HEP.   ASSESSMENT:  CLINICAL IMPRESSION: Patient presents to PT for aquatic therapy today.  PT educated pt with aquatic benefits, purpose,  and rationale.  She had good tolerance with aquatic exercises.  Pt states her hip felt good in the pool.  Pt performed aquatic exercises well with instruction and cuing for correct form.  Pt able to perform SLS for 20 sec without UE support.  She responded well to Rx reporting improved pain to 6/10 after Rx.  Pt may benefit from skilled PT services focusing on aquatic therapy to address  impairments and improve overall function.      OBJECTIVE IMPAIRMENTS: decreased activity tolerance, decreased mobility, decreased ROM, decreased strength, hypomobility, impaired flexibility, and pain.   ACTIVITY LIMITATIONS: lifting, sleeping, and turning  PARTICIPATION LIMITATIONS: meal prep and cleaning  PERSONAL FACTORS: 1 comorbidity: back pain  are also affecting patient's functional outcome.   REHAB POTENTIAL: Good  CLINICAL DECISION MAKING: Stable/uncomplicated  EVALUATION COMPLEXITY: Low   GOALS:  SHORT TERM GOALS: Target date: 7/23/204  Pt will tolerate aquatic therapy without adverse effects for improved pain, strength, function, and tolerance to activity.  Baseline: Goal status: INITIAL  2.  Pt will report at least a 25% improvement in pain and sx's overall.  Baseline:  Goal status: INITIAL  3.  Pt will report reduced pain at night and improved sleeping.  Baseline:  Goal status: INITIAL Target date:  05/27/2023  4.  Pt will demo improved R hip AROM to at least 10-15 deg in abd for improved mobility.   Goal status:  INITIAL Target date:  05/27/2023    LONG TERM GOALS: Target date: 06/24/2023  Pt will report she is able to perform her normal community ambulation with good stability without her leg wanting to give way.  Baseline:  Goal status: INITIAL  2.  Pt will perform 5x STS test in < 15 sec for improved functional LE strength.  Baseline:  Goal status: INITIAL  3.  Pt will report at least a 70% reduction in pain with turning/pivoting with daily activities.  Baseline:  Goal  status: INITIAL  4.  Pt will demo 5/5 strength in R hip flexion and knee ext and 20# with HHD testing for hip abduction for improved tolerance to activity and performance of ADLs/IADLs.  Baseline:  Goal status: INITIAL  5.  Pt will be independent with HEP for improved pain, strength, ROM, and function.  Baseline:  Goal status:  INITIAL    PLAN:  PT FREQUENCY:  2 times per week  PT DURATION: Probable 5 weeks of PT, but will extend POC to 8 weeks due to clinic availablity  PLANNED INTERVENTIONS: Therapeutic exercises, Therapeutic activity, Neuromuscular re-education, Balance training, Gait training, Patient/Family education, Self Care, Joint mobilization, Stair training, Aquatic Therapy, Dry Needling, Electrical stimulation, Spinal mobilization, Cryotherapy, Moist heat, Taping, Ultrasound, Manual therapy, and Re-evaluation  PLAN FOR NEXT SESSION: Aquatic therapy for 3 weeks - 4 weeks and then return to land for assessment and exercises.     Audie Clear III PT, DPT 05/09/23 1:48 PM

## 2023-05-09 ENCOUNTER — Encounter (HOSPITAL_BASED_OUTPATIENT_CLINIC_OR_DEPARTMENT_OTHER): Payer: Self-pay | Admitting: Physical Therapy

## 2023-05-13 ENCOUNTER — Ambulatory Visit (HOSPITAL_BASED_OUTPATIENT_CLINIC_OR_DEPARTMENT_OTHER): Payer: Medicare Other | Admitting: Physical Therapy

## 2023-05-13 DIAGNOSIS — M25651 Stiffness of right hip, not elsewhere classified: Secondary | ICD-10-CM

## 2023-05-13 DIAGNOSIS — S73191A Other sprain of right hip, initial encounter: Secondary | ICD-10-CM | POA: Diagnosis not present

## 2023-05-13 DIAGNOSIS — M6281 Muscle weakness (generalized): Secondary | ICD-10-CM | POA: Diagnosis not present

## 2023-05-13 DIAGNOSIS — M25551 Pain in right hip: Secondary | ICD-10-CM | POA: Diagnosis not present

## 2023-05-13 NOTE — Therapy (Signed)
OUTPATIENT PHYSICAL THERAPY TREATMENT NOTE    Patient Name: [name] MRN: [id number] DOB:11-11-1950, 72 y.o., female Today's Date: 05/14/2023  END OF SESSION:  PT End of Session - 05/13/23 1533     Visit Number 3    Number of Visits 10    Date for PT Re-Evaluation 06/24/23    Authorization Type BCBS MCR    PT Start Time 1445    PT Stop Time 1527    PT Time Calculation (min) 42 min    Activity Tolerance Patient tolerated treatment well    Behavior During Therapy [facility] for tasks assessed/performed               Past Medical History:  Diagnosis Date   Allergy    Anxiety    Back pain with radiation 07/13/2012   GERD (gastroesophageal reflux disease)    Glaucoma    Hyperlipidemia    Osteoarthritis    Past Surgical History:  Procedure Laterality Date   BREAST CYST EXCISION Right    COLONOSCOPY WITH PROPOFOL N/A 01/02/2022   Procedure: COLONOSCOPY WITH PROPOFOL;  Surgeon: Dolores Frame, MD;  Location: [facility];  Service: Gastroenterology;  Laterality: N/A;  1130   cyst removed from right breast     LAPAROSCOPIC SALPINGO OOPHERECTOMY Right 04/09/2017   Procedure: LAPAROSCOPIC RIGHT SALPINGO OOPHORECTOMY; RIGHT OVARIAN BENIGN CYSTIC TERATOMA;  Surgeon: Lazaro Arms, MD;  Location: [facility];  Service: Gynecology;  Laterality: Right;   PARTIAL HYSTERECTOMY     TUBAL LIGATION     Patient Active Problem List   Diagnosis Date Noted   Sciatica of right side 02/27/2023   Osteoarthritis of hips, bilateral 09/24/2022   Cervicalgia 09/25/2020   Seasonal allergies 02/18/2020   Depression, major, single episode, in partial remission (HCC) 10/29/2019   Essential hypertension 01/01/2018   Prediabetes 03/30/2015   Annual physical exam 04/08/2014   Right hip pain 04/06/2014   Hyperlipemia 06/22/2008   GERD (gastroesophageal reflux disease) 05/24/2008   Allergic rhinitis 01/08/2008   OVERACTIVE BLADDER 04/10/2007   GAD (generalized anxiety disorder) 08/22/2006    OSTEOARTHRITIS 08/22/2006    REFERRING PROVIDER:  Huel Cote, MD  REFERRING DIAG: [phone] (ICD-10-CM) - Tear of right acetabular labrum, initial encounter  THERAPY DIAG:  Pain in right hip  Stiffness of right hip, not elsewhere classified  Muscle weakness (generalized)  Rationale for Evaluation and Treatment: Rehabilitation  ONSET DATE: November/December 2023  SUBJECTIVE:   SUBJECTIVE STATEMENT: Pt states "I love the water".  Pt had no adverse effects after prior Rx.  Pt reports stiffness in R thigh.  Pt states she did pretty good when she was outside walking and working on her flowers.  Pt states "the pain can hit me when I'm walking".   Pt has increased pain with ambulating 10 mins.  Pt has increased hip pain with turning/pivoting.  Pt has been performing some of her HEP without increased pain.  She has not been using any therabands.        PERTINENT HISTORY: Anxiety Back pain, lumbar spondylosis OA  PAIN:  Are you having pain? Yes NPRS:  7/10 current, >10/10 worst, 4/10 best Worst pain is with sudden turning Location:  R sided groin  PRECAUTIONS: Other: per dx  WEIGHT BEARING RESTRICTIONS: No  FALLS:  Has patient fallen in last 6 months? Yes. Number of falls 1 fall when she fainted  LIVING ENVIRONMENT: Lives with: lives with boyfriend and son Lives in: 1 story home Stairs: 4 steps to  enter home with bilat rails, 8 steps on deck with bilat rails Has following equipment at home: None   PLOF: Independent  PATIENT GOALS: decrease pain, to be able to turn without pain, improved sleeping   OBJECTIVE:   DIAGNOSTIC FINDINGS:  (Per Epic) R hip MRI: IMPRESSION: 1. Mild degenerative changes at both hips with a small right hip joint effusion. No acute osseous findings. 2. Mild edema within the right hip adductor muscles, likely muscular strain. 3. 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 4. Lower lumbar  spondylosis.  R hip x ray: IMPRESSION: Mild bilateral femoroacetabular osteoarthritis.   (Per MD note) R hip X ray: Very mild degenerative findings with some ossification of the labrum   R hip MRI: There does appear to be a degenerative anterior superior labral tear with 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   TODAY'S TREATMENT:                                                                                                                                PT reviewed her current HEP. Pt performed:  Supine bridge 2x10  Supine clam SL x10 each LE  S/L clam x10 reps  Seated hip abd with YTB 2 x 10  LAQ 2# 2x10-15  Sit to stands from elevated table x 3 reps.  Stopped due to pain  Seated HS curl with RTB 2x10  Sidestepping x 1 lap with bilat UE support at rail   Neuro Re-ed Activities:  Standing with NBOS 2x30 sec with occasional UE support on rail  Marching on airex with bilat UE support 2x10  Standing in staggered stance 2x20 sec with occasional UE support on rail with SBA to min assist   PATIENT EDUCATION:  Education details:  exercise rationale, HEP, POC, relevant anatomy, exercise form, and dx.  Person educated: Patient Education method: Explanation, demonstration, verbal cuing Education comprehension: verbalized understanding, returned demonstration, verbal cuing required  HOME EXERCISE PROGRAM: Pt has a land based HEP.   ASSESSMENT:  CLINICAL IMPRESSION: Pt did not perform aquatic therapy today due to not coming prepared for the pool.  Pt had pain with exercises and was limited with certain exercises due to pain.  Pt had pain with S/L clam worse than supine clam.  PT had pt stop sit to stands from elevated table due to pain.  Pt gives good effort with all exercises.  Pt would occasionally bend forward, grab rail, and decrease weight thru R LE with standing exercises due to pain.  PT monitored pt's pain and sx's t/o Rx.  Pt reports  improved tightness in thigh and improved pain from 7/10 before Rx to 6/10 after Rx.  Pt can perform land therapy, though may benefit more from aquatic therapy at this time.    OBJECTIVE IMPAIRMENTS: decreased activity tolerance, decreased mobility, decreased ROM, decreased strength, hypomobility, impaired flexibility, and pain.   ACTIVITY LIMITATIONS: lifting,  sleeping, and turning  PARTICIPATION LIMITATIONS: meal prep and cleaning  PERSONAL FACTORS: 1 comorbidity: back pain  are also affecting patient's functional outcome.   REHAB POTENTIAL: Good  CLINICAL DECISION MAKING: Stable/uncomplicated  EVALUATION COMPLEXITY: Low   GOALS:  SHORT TERM GOALS: Target date: 7/23/204  Pt will tolerate aquatic therapy without adverse effects for improved pain, strength, function, and tolerance to activity.  Baseline: Goal status: INITIAL  2.  Pt will report at least a 25% improvement in pain and sx's overall.  Baseline:  Goal status: INITIAL  3.  Pt will report reduced pain at night and improved sleeping.  Baseline:  Goal status: INITIAL Target date:  05/27/2023  4.  Pt will demo improved R hip AROM to at least 10-15 deg in abd for improved mobility.   Goal status:  INITIAL Target date:  05/27/2023    LONG TERM GOALS: Target date: 06/24/2023  Pt will report she is able to perform her normal community ambulation with good stability without her leg wanting to give way.  Baseline:  Goal status: INITIAL  2.  Pt will perform 5x STS test in < 15 sec for improved functional LE strength.  Baseline:  Goal status: INITIAL  3.  Pt will report at least a 70% reduction in pain with turning/pivoting with daily activities.  Baseline:  Goal status: INITIAL  4.  Pt will demo 5/5 strength in R hip flexion and knee ext and 20# with HHD testing for hip abduction for improved tolerance to activity and performance of ADLs/IADLs.  Baseline:  Goal status: INITIAL  5.  Pt will be independent with HEP  for improved pain, strength, ROM, and function.  Baseline:  Goal status:  INITIAL    PLAN:  PT FREQUENCY:  2 times per week  PT DURATION: Probable 5 weeks of PT, but will extend POC to 8 weeks due to clinic availablity  PLANNED INTERVENTIONS: Therapeutic exercises, Therapeutic activity, Neuromuscular re-education, Balance training, Gait training, Patient/Family education, Self Care, Joint mobilization, Stair training, Aquatic Therapy, Dry Needling, Electrical stimulation, Spinal mobilization, Cryotherapy, Moist heat, Taping, Ultrasound, Manual therapy, and Re-evaluation  PLAN FOR NEXT SESSION: Aquatic therapy for 3 weeks - 4 weeks and then return to land for assessment and exercises.     Audie Clear III PT, DPT 05/14/23 9:41 PM

## 2023-05-14 ENCOUNTER — Ambulatory Visit: Payer: Medicare Other | Admitting: Orthopaedic Surgery

## 2023-05-14 ENCOUNTER — Encounter: Payer: Self-pay | Admitting: Orthopaedic Surgery

## 2023-05-14 ENCOUNTER — Encounter (HOSPITAL_BASED_OUTPATIENT_CLINIC_OR_DEPARTMENT_OTHER): Payer: Self-pay | Admitting: Physical Therapy

## 2023-05-14 DIAGNOSIS — M25551 Pain in right hip: Secondary | ICD-10-CM | POA: Diagnosis not present

## 2023-05-14 NOTE — Progress Notes (Signed)
The patient is a 72 year old active female sent to me from Dr. Steward Drone my partner to assess her right hip.  He has seen her before as well and a MRI of her right hip shows just some mild arthritic changes.  There was definitely strain of the iliopsoas muscle and I can see that on the MRI.  It was not a MRI arthrogram but there was some slight signal changes in the labrum.  Her pain is only in the groin.  He did place a steroid injection in her right hip joint under ultrasound back in May and she said that really did help her for about a month.  She is now in aquatic therapy.  She tries to stay active but it is definitely causing her to have difficulty with her mobility in general.  She is walking without assistive device as well.  I can easily put her right hip through internal and external rotation with no blocks to rotation and the pain seems to be consistent in her groin and inner thigh medially.  This does not seem to be related to the spine at all.  She does not have a true positive straight leg raise on the right side and she has excellent strength in all muscle groups and normal sensation in all dermatomes.  I did review the plain films of her pelvis and right hip as well as the MRI and looked at all imaging.  I showed her the imaging as well.  She understands that this is certainly complicated.  I have seen hips like hers where we eventually replaced the hip and find that it is much worse than what the MRI shows.  On the other hand, I have seen situations where it is not as bad.  She would like to avoid hip replacement surgery and would like to consider whether or not she is a candidate for trying at least a hip arthroscopy.  She does have follow-up appointment apparently with Dr. Steward Drone in the next month or 2 and I think this is worth her discussing this with him again.  She would rather try that route as opposed to hip replacement for now.  It is encouraging given the fact that she had some relief  from that injection and that aquatic therapy is helping her as well.  All questions and concerns were addressed and answered.

## 2023-05-19 ENCOUNTER — Ambulatory Visit (HOSPITAL_BASED_OUTPATIENT_CLINIC_OR_DEPARTMENT_OTHER): Payer: Medicare Other | Admitting: Physical Therapy

## 2023-05-20 ENCOUNTER — Other Ambulatory Visit: Payer: Self-pay | Admitting: Family Medicine

## 2023-05-20 ENCOUNTER — Ambulatory Visit: Payer: Medicare Other | Admitting: Orthopaedic Surgery

## 2023-05-21 ENCOUNTER — Ambulatory Visit (HOSPITAL_BASED_OUTPATIENT_CLINIC_OR_DEPARTMENT_OTHER): Payer: Medicare Other | Admitting: Physical Therapy

## 2023-05-25 ENCOUNTER — Other Ambulatory Visit: Payer: Self-pay | Admitting: Family Medicine

## 2023-05-26 ENCOUNTER — Ambulatory Visit (HOSPITAL_BASED_OUTPATIENT_CLINIC_OR_DEPARTMENT_OTHER): Payer: Medicare Other | Admitting: Physical Therapy

## 2023-05-26 ENCOUNTER — Encounter (HOSPITAL_BASED_OUTPATIENT_CLINIC_OR_DEPARTMENT_OTHER): Payer: Self-pay | Admitting: Physical Therapy

## 2023-05-26 DIAGNOSIS — M6281 Muscle weakness (generalized): Secondary | ICD-10-CM

## 2023-05-26 DIAGNOSIS — M25651 Stiffness of right hip, not elsewhere classified: Secondary | ICD-10-CM | POA: Diagnosis not present

## 2023-05-26 DIAGNOSIS — M25551 Pain in right hip: Secondary | ICD-10-CM

## 2023-05-26 DIAGNOSIS — S73191A Other sprain of right hip, initial encounter: Secondary | ICD-10-CM | POA: Diagnosis not present

## 2023-05-26 NOTE — Therapy (Signed)
OUTPATIENT PHYSICAL THERAPY TREATMENT NOTE    Patient Name: [name] MRN: [id number] DOB:[birth year], 72 y.o., female Today's Date: 05/26/2023  END OF SESSION:  PT End of Session - 05/26/23 0822     Visit Number 4    Number of Visits 10    Date for PT Re-Evaluation 06/24/23    Authorization Type BCBS MCR    PT Start Time 0817    PT Stop Time 0900    PT Time Calculation (min) 43 min    Activity Tolerance Patient limited by pain    Behavior During Therapy [facility] for tasks assessed/performed                Past Medical History:  Diagnosis Date   Allergy    Anxiety    Back pain with radiation 07/13/2012   GERD (gastroesophageal reflux disease)    Glaucoma    Hyperlipidemia    Osteoarthritis    Past Surgical History:  Procedure Laterality Date   BREAST CYST EXCISION Right    COLONOSCOPY WITH PROPOFOL N/A 01/02/2022   Procedure: COLONOSCOPY WITH PROPOFOL;  Surgeon: Dolores Frame, MD;  [facility]: [facility];  Service: Gastroenterology;  Laterality: N/A;  1130   cyst removed from right breast     LAPAROSCOPIC SALPINGO OOPHERECTOMY Right 04/09/2017   Procedure: LAPAROSCOPIC RIGHT SALPINGO OOPHORECTOMY; RIGHT OVARIAN BENIGN CYSTIC TERATOMA;  Surgeon: Lazaro Arms, MD;  Location: [facility];  Service: Gynecology;  Laterality: Right;   PARTIAL HYSTERECTOMY     TUBAL LIGATION     Patient Active Problem List   Diagnosis Date Noted   Sciatica of right side 02/27/2023   Osteoarthritis of hips, bilateral 09/24/2022   Cervicalgia 09/25/2020   Seasonal allergies 02/18/2020   Depression, major, single episode, in partial remission (HCC) 10/29/2019   Essential hypertension 01/01/2018   Prediabetes 03/30/2015   Annual physical exam 04/08/2014   Right hip pain 04/06/2014   Hyperlipemia 06/22/2008   GERD (gastroesophageal reflux disease) 05/24/2008   Allergic rhinitis 01/08/2008   OVERACTIVE BLADDER 04/10/2007   GAD (generalized anxiety disorder) 08/22/2006    OSTEOARTHRITIS 08/22/2006    REFERRING PROVIDER:  Huel Cote, MD  REFERRING DIAG: [phone] (ICD-10-CM) - Tear of right acetabular labrum, initial encounter  THERAPY DIAG:  Pain in right hip  Stiffness of right hip, not elsewhere classified  Muscle weakness (generalized)  Rationale for Evaluation and Treatment: Rehabilitation  ONSET DATE: November/December 2023  SUBJECTIVE:   SUBJECTIVE STATEMENT: Pt states right hip pain increased this week maybe due to rainy weather.  Long walk to setting with pain 9-10/10.  Some discomfort after last land based session.       PERTINENT HISTORY: Anxiety Back pain, lumbar spondylosis OA  PAIN:  Are you having pain? Yes NPRS:  10/10 current, >10/10 worst, 4/10 best Worst pain is with sudden turning Location:  R sided groin  PRECAUTIONS: Other: per dx  WEIGHT BEARING RESTRICTIONS: No  FALLS:  Has patient fallen in last 6 months? Yes. Number of falls 1 fall when she fainted  LIVING ENVIRONMENT: Lives with: lives with boyfriend and son Lives in: 1 story home Stairs: 4 steps to enter home with bilat rails, 8 steps on deck with bilat rails Has following equipment at home: None   PLOF: Independent  PATIENT GOALS: decrease pain, to be able to turn without pain, improved sleeping   OBJECTIVE:   DIAGNOSTIC FINDINGS:  (Per Epic) R hip MRI: IMPRESSION: 1. Mild degenerative changes at both hips with a  small right hip joint effusion. No acute osseous findings. 2. Mild edema within the right hip adductor muscles, likely muscular strain. 3. 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 4. Lower lumbar spondylosis.  R hip x ray: IMPRESSION: Mild bilateral femoroacetabular osteoarthritis.   (Per MD note) R hip X ray: Very mild degenerative findings with some ossification of the labrum   R hip MRI: There does appear to be a degenerative anterior superior labral tear with overall very mild  degenerative changes about the femoral acetabular joint.   TODAY'S TREATMENT:                                                                                                                                PT reviewed her current HEP. Pt seen for aquatic therapy today.  Treatment took place in water 3.5-4.75 ft in depth at the [facility] pool. Temp of water was 91.  Pt entered/exited the pool via stairs alternating step to and step through pattern with bilat hand rail.  *walking forward, back and side stepping *Standing ue support wall (and yellow HB) 3.8 ft: SL clam; hip circles cw; hip flex/ext; relaxed squats x 10-15 reps *Straddling noodle ue support on HB: cycling x 4 widths (cues for increasing or decreasing cycle range as tolerate); UE support on walls hip add/abd x 3 mins then hip flex/ext x 3 mins. Cues for comfortable range *STS from bench onto pool floor x 10 (easy); to water step x 3 (painful and modified execution) *Hip hinging x 10.  (VC for execution and focus muscle groups) *stair climbing negotiation/instruction for step to pattern to decrease pain  Pt requires the buoyancy and hydrostatic pressure of water for support, and to offload joints by unweighting joint load by at least 50 % in navel deep water and by at least 75-80% in chest to neck deep water.  Viscosity of the water is needed for resistance of strengthening. Water current perturbations provides challenge to standing balance requiring increased core activation.     Previous: Pt performed:  Supine bridge 2x10  Supine clam SL x10 each LE  S/L clam x10 reps  Seated hip abd with YTB 2 x 10  LAQ 2# 2x10-15  Sit to stands from elevated table x 3 reps.  Stopped due to pain  Seated HS curl with RTB 2x10  Sidestepping x 1 lap with bilat UE support at rail   Neuro Re-ed Activities:  Standing with NBOS 2x30 sec with occasional UE support on rail  Marching on airex with bilat UE support 2x10  Standing in  staggered stance 2x20 sec with occasional UE support on rail with SBA to min assist   PATIENT EDUCATION:  Education details:  exercise rationale, HEP, POC, relevant anatomy, exercise form, and dx.  Person educated: Patient Education method: Explanation, demonstration, verbal cuing Education comprehension: verbalized understanding, returned demonstration, verbal cuing required  HOME EXERCISE PROGRAM: Pt has a  land based HEP.   ASSESSMENT:  CLINICAL IMPRESSION: Pt safe and indep in setting with therapist instructing from deck.  She tolerates hip movements in all panes with some initial discomfort although with repetition improves and "feels good".  She is ablewto find her COB maintaining position on noodle well.  Began STS transfers from bench onto water step with difficulty.  Backed up to complete hip hinging to improve execution of STS. Requires mod cuing for stair negotiation.  Also suggested SPC to allieviate antalgic gait with increased pain walking distance to setting. Reduction in pain to 5/10.    OBJECTIVE IMPAIRMENTS: decreased activity tolerance, decreased mobility, decreased ROM, decreased strength, hypomobility, impaired flexibility, and pain.   ACTIVITY LIMITATIONS: lifting, sleeping, and turning  PARTICIPATION LIMITATIONS: meal prep and cleaning  PERSONAL FACTORS: 1 comorbidity: back pain  are also affecting patient's functional outcome.   REHAB POTENTIAL: Good  CLINICAL DECISION MAKING: Stable/uncomplicated  EVALUATION COMPLEXITY: Low   GOALS:  SHORT TERM GOALS: Target date: 7/23/204  Pt will tolerate aquatic therapy without adverse effects for improved pain, strength, function, and tolerance to activity.  Baseline: Goal status: INITIAL  2.  Pt will report at least a 25% improvement in pain and sx's overall.  Baseline:  Goal status: INITIAL  3.  Pt will report reduced pain at night and improved sleeping.  Baseline:  Goal status: INITIAL Target date:   05/27/2023  4.  Pt will demo improved R hip AROM to at least 10-15 deg in abd for improved mobility.   Goal status:  INITIAL Target date:  05/27/2023    LONG TERM GOALS: Target date: 06/24/2023  Pt will report she is able to perform her normal community ambulation with good stability without her leg wanting to give way.  Baseline:  Goal status: INITIAL  2.  Pt will perform 5x STS test in < 15 sec for improved functional LE strength.  Baseline:  Goal status: INITIAL  3.  Pt will report at least a 70% reduction in pain with turning/pivoting with daily activities.  Baseline:  Goal status: INITIAL  4.  Pt will demo 5/5 strength in R hip flexion and knee ext and 20# with HHD testing for hip abduction for improved tolerance to activity and performance of ADLs/IADLs.  Baseline:  Goal status: INITIAL  5.  Pt will be independent with HEP for improved pain, strength, ROM, and function.  Baseline:  Goal status:  INITIAL    PLAN:  PT FREQUENCY:  2 times per week  PT DURATION: Probable 5 weeks of PT, but will extend POC to 8 weeks due to clinic availablity  PLANNED INTERVENTIONS: Therapeutic exercises, Therapeutic activity, Neuromuscular re-education, Balance training, Gait training, Patient/Family education, Self Care, Joint mobilization, Stair training, Aquatic Therapy, Dry Needling, Electrical stimulation, Spinal mobilization, Cryotherapy, Moist heat, Taping, Ultrasound, Manual therapy, and Re-evaluation  PLAN FOR NEXT SESSION: Aquatic therapy for 3 weeks - 4 weeks and then return to land for assessment and exercises.     Corrie Dandy Tomma Lightning) Charese Abundis MPT 05/26/23 8:23 AM

## 2023-05-29 ENCOUNTER — Ambulatory Visit (HOSPITAL_BASED_OUTPATIENT_CLINIC_OR_DEPARTMENT_OTHER): Payer: Medicare Other | Attending: Orthopaedic Surgery | Admitting: Physical Therapy

## 2023-05-29 ENCOUNTER — Encounter (HOSPITAL_BASED_OUTPATIENT_CLINIC_OR_DEPARTMENT_OTHER): Payer: Self-pay | Admitting: Physical Therapy

## 2023-05-29 DIAGNOSIS — M25651 Stiffness of right hip, not elsewhere classified: Secondary | ICD-10-CM | POA: Insufficient documentation

## 2023-05-29 DIAGNOSIS — M6281 Muscle weakness (generalized): Secondary | ICD-10-CM | POA: Diagnosis not present

## 2023-05-29 DIAGNOSIS — M25551 Pain in right hip: Secondary | ICD-10-CM | POA: Insufficient documentation

## 2023-05-29 NOTE — Therapy (Signed)
OUTPATIENT PHYSICAL THERAPY TREATMENT NOTE    Patient Name: [name] MRN: [id number] DOB:[birth year], 72 y.o., female Today's Date: 05/29/2023  END OF SESSION:  PT End of Session - 05/29/23 0940     Visit Number 5    Number of Visits 10    Date for PT Re-Evaluation 06/24/23    Authorization Type BCBS MCR    PT Start Time 0853    PT Stop Time 0933    PT Time Calculation (min) 40 min    Activity Tolerance Patient tolerated treatment well    Behavior During Therapy [facility] for tasks assessed/performed                Past Medical History:  Diagnosis Date   Allergy    Anxiety    Back pain with radiation 07/13/2012   GERD (gastroesophageal reflux disease)    Glaucoma    Hyperlipidemia    Osteoarthritis    Past Surgical History:  Procedure Laterality Date   BREAST CYST EXCISION Right    COLONOSCOPY WITH PROPOFOL N/A 01/02/2022   Procedure: COLONOSCOPY WITH PROPOFOL;  Surgeon: Dolores Frame, MD;  [facility];  Service: Gastroenterology;  Laterality: N/A;  1130   cyst removed from right breast     LAPAROSCOPIC SALPINGO OOPHERECTOMY Right 04/09/2017   Procedure: LAPAROSCOPIC RIGHT SALPINGO OOPHORECTOMY; RIGHT OVARIAN BENIGN CYSTIC TERATOMA;  Surgeon: Lazaro Arms, MD;  Location: [facility];  Service: Gynecology;  Laterality: Right;   PARTIAL HYSTERECTOMY     TUBAL LIGATION     Patient Active Problem List   Diagnosis Date Noted   Sciatica of right side 02/27/2023   Osteoarthritis of hips, bilateral 09/24/2022   Cervicalgia 09/25/2020   Seasonal allergies 02/18/2020   Depression, major, single episode, in partial remission (HCC) 10/29/2019   Essential hypertension 01/01/2018   Prediabetes 03/30/2015   Annual physical exam 04/08/2014   Right hip pain 04/06/2014   Hyperlipemia 06/22/2008   GERD (gastroesophageal reflux disease) 05/24/2008   Allergic rhinitis 01/08/2008   OVERACTIVE BLADDER 04/10/2007   GAD (generalized anxiety disorder) 08/22/2006    OSTEOARTHRITIS 08/22/2006    REFERRING PROVIDER:  Huel Cote, MD  REFERRING DIAG: [phone] (ICD-10-CM) - Tear of right acetabular labrum, initial encounter  THERAPY DIAG:  Pain in right hip  Stiffness of right hip, not elsewhere classified  Muscle weakness (generalized)  Rationale for Evaluation and Treatment: Rehabilitation  ONSET DATE: November/December 2023  SUBJECTIVE:   SUBJECTIVE STATEMENT: Pt denies any adverse effects after prior land based and aquatic treatments.  Pt enjoyed aquatic therapy and states she loved cycling on noodle.  Pt reports no improvement in pain with turning.  Pt reports improved pain with ambulation.  Pt states she feels better with movement.      PERTINENT HISTORY: Anxiety Back pain, lumbar spondylosis OA  PAIN:  Are you having pain? Yes NPRS:  7/10 current, >10/10 worst, 4/10 best Worst pain is with sudden turning Location:  R sided groin but can travel down anterior thigh  PRECAUTIONS: Other: per dx  WEIGHT BEARING RESTRICTIONS: No  FALLS:  Has patient fallen in last 6 months? Yes. Number of falls 1 fall when she fainted  LIVING ENVIRONMENT: Lives with: lives with boyfriend and son Lives in: 1 story home Stairs: 4 steps to enter home with bilat rails, 8 steps on deck with bilat rails Has following equipment at home: None   PLOF: Independent  PATIENT GOALS: decrease pain, to be able to turn without pain, improved  sleeping   OBJECTIVE:   DIAGNOSTIC FINDINGS:  (Per Epic) R hip MRI: IMPRESSION: 1. Mild degenerative changes at both hips with a small right hip joint effusion. No acute osseous findings. 2. Mild edema within the right hip adductor muscles, likely muscular strain. 3. 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 4. Lower lumbar spondylosis.  R hip x ray: IMPRESSION: Mild bilateral femoroacetabular osteoarthritis.   (Per MD note) R hip X ray: Very mild degenerative  findings with some ossification of the labrum   R hip MRI: There does appear to be a degenerative anterior superior labral tear with 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   TODAY'S TREATMENT:                                                                                                                               Pt performed:  Therapeutic Exercise: Supine bridge x 10 reps and 2x10 with GTB around knees  Supine clam with RTB 3x10  LAQ 3# 2x10  Seated HS curl with RTB 2x10  Sidestepping x 2 laps without bilat UE support at rail  Step ups on 4 inch step with rail 2x10 with 1-2 occasions of CGA   Neuro Re-ed Activities:  Standing with NBOS on airex 2x30 sec with occasional UE support on rail  Attempted marching on airex with bilat UE support though stopped due to pain  Standing in staggered stance 2x20 sec with occasional UE support on rail with SBA to min assist  Alt toe tapping on 6 inch step x 10 reps and on 8 inch step x 10 reps with SBA and one occasion of CGA   PATIENT EDUCATION:  Education details:  exercise rationale, HEP, POC, relevant anatomy, exercise form, and dx.  Person educated: Patient Education method: Explanation, demonstration, verbal cuing Education comprehension: verbalized understanding, returned demonstration, verbal cuing required  HOME EXERCISE PROGRAM: Pt has a land based HEP.   ASSESSMENT:  CLINICAL IMPRESSION: Pt states she is improving including having reduced pain with ambulation.  Pt denies any improvement in pain with turning.  Pt enjoyed prior treatment of aquatic therapy.  Pt is improving with tolerance for exercises.  PT progressed exercises with increasing resistance with LAQ and seated HS curl and increasing laps with sidestepping.  She also performed sidestepping without UE support with good stability.  Pt had increased pain with attempting to lift R LE with airex marches and PT had pt stop that exercise  due to pain.  She demonstrates improved stability with balance exercises.  Pt responded well to Rx reporting improved pain from 7/10 before Rx to 5/10 after Rx and states she doesn't feel the pulling in anterio thigh.   OBJECTIVE IMPAIRMENTS: decreased activity tolerance, decreased mobility, decreased ROM, decreased strength, hypomobility, impaired flexibility, and pain.   ACTIVITY LIMITATIONS: lifting, sleeping, and turning  PARTICIPATION LIMITATIONS: meal prep and cleaning  PERSONAL FACTORS: 1 comorbidity: back  pain  are also affecting patient's functional outcome.   REHAB POTENTIAL: Good  CLINICAL DECISION MAKING: Stable/uncomplicated  EVALUATION COMPLEXITY: Low   GOALS:  SHORT TERM GOALS: Target date: 7/23/204  Pt will tolerate aquatic therapy without adverse effects for improved pain, strength, function, and tolerance to activity.  Baseline: Goal status: INITIAL  2.  Pt will report at least a 25% improvement in pain and sx's overall.  Baseline:  Goal status: INITIAL  3.  Pt will report reduced pain at night and improved sleeping.  Baseline:  Goal status: INITIAL Target date:  05/27/2023  4.  Pt will demo improved R hip AROM to at least 10-15 deg in abd for improved mobility.   Goal status:  INITIAL Target date:  05/27/2023    LONG TERM GOALS: Target date: 06/24/2023  Pt will report she is able to perform her normal community ambulation with good stability without her leg wanting to give way.  Baseline:  Goal status: INITIAL  2.  Pt will perform 5x STS test in < 15 sec for improved functional LE strength.  Baseline:  Goal status: INITIAL  3.  Pt will report at least a 70% reduction in pain with turning/pivoting with daily activities.  Baseline:  Goal status: INITIAL  4.  Pt will demo 5/5 strength in R hip flexion and knee ext and 20# with HHD testing for hip abduction for improved tolerance to activity and performance of ADLs/IADLs.  Baseline:  Goal status:  INITIAL  5.  Pt will be independent with HEP for improved pain, strength, ROM, and function.  Baseline:  Goal status:  INITIAL    PLAN:  PT FREQUENCY:  2 times per week  PT DURATION: Probable 5 weeks of PT, but will extend POC to 8 weeks due to clinic availablity  PLANNED INTERVENTIONS: Therapeutic exercises, Therapeutic activity, Neuromuscular re-education, Balance training, Gait training, Patient/Family education, Self Care, Joint mobilization, Stair training, Aquatic Therapy, Dry Needling, Electrical stimulation, Spinal mobilization, Cryotherapy, Moist heat, Taping, Ultrasound, Manual therapy, and Re-evaluation  PLAN FOR NEXT SESSION: Cont with land and aquatic therapy.   Audie Clear III PT, DPT 05/29/23 3:24 PM

## 2023-05-30 ENCOUNTER — Other Ambulatory Visit: Payer: Self-pay

## 2023-05-30 ENCOUNTER — Telehealth: Payer: Self-pay | Admitting: Family Medicine

## 2023-05-30 MED ORDER — OXYBUTYNIN CHLORIDE 5 MG PO TABS: ORAL_TABLET | ORAL | 2 refills | Status: DC

## 2023-05-30 NOTE — Telephone Encounter (Signed)
Med refilled.

## 2023-05-30 NOTE — Telephone Encounter (Signed)
Prescription Request  05/30/2023  LOV: 02/27/2023  What is the name of the medication or equipment? oxybutynin (DITROPAN) 5 MG tablet   Have you contacted your pharmacy to request a refill? Yes   Which pharmacy would you like this sent to?   [facility]  Patient notified that their request is being sent to the clinical staff for review and that they should receive a response within 2 business days.   Please advise at Mobile [phone] (mobile)

## 2023-06-02 ENCOUNTER — Encounter (HOSPITAL_BASED_OUTPATIENT_CLINIC_OR_DEPARTMENT_OTHER): Payer: Self-pay | Admitting: Physical Therapy

## 2023-06-02 ENCOUNTER — Ambulatory Visit (HOSPITAL_BASED_OUTPATIENT_CLINIC_OR_DEPARTMENT_OTHER): Payer: Medicare Other | Admitting: Orthopaedic Surgery

## 2023-06-02 ENCOUNTER — Ambulatory Visit (HOSPITAL_BASED_OUTPATIENT_CLINIC_OR_DEPARTMENT_OTHER): Payer: Medicare Other | Admitting: Physical Therapy

## 2023-06-02 ENCOUNTER — Other Ambulatory Visit: Payer: Self-pay | Admitting: Family Medicine

## 2023-06-02 DIAGNOSIS — M6281 Muscle weakness (generalized): Secondary | ICD-10-CM

## 2023-06-02 DIAGNOSIS — M25551 Pain in right hip: Secondary | ICD-10-CM

## 2023-06-02 DIAGNOSIS — M25651 Stiffness of right hip, not elsewhere classified: Secondary | ICD-10-CM | POA: Diagnosis not present

## 2023-06-02 DIAGNOSIS — F4323 Adjustment disorder with mixed anxiety and depressed mood: Secondary | ICD-10-CM

## 2023-06-02 NOTE — Therapy (Signed)
OUTPATIENT PHYSICAL THERAPY TREATMENT NOTE    Patient Name: [name] MRN: [id number] DOB:1951/06/22, 72 y.o., female Today's Date: 06/02/2023  END OF SESSION:  PT End of Session - 06/02/23 1037     Visit Number 6    Number of Visits 10    Date for PT Re-Evaluation 06/24/23    Authorization Type BCBS MCR    PT Start Time 1025    PT Stop Time 1110    PT Time Calculation (min) 45 min    Behavior During Therapy [facility] for tasks assessed/performed                Past Medical History:  Diagnosis Date   Allergy    Anxiety    Back pain with radiation 07/13/2012   GERD (gastroesophageal reflux disease)    Glaucoma    Hyperlipidemia    Osteoarthritis    Past Surgical History:  Procedure Laterality Date   BREAST CYST EXCISION Right    COLONOSCOPY WITH PROPOFOL N/A 01/02/2022   Procedure: COLONOSCOPY WITH PROPOFOL;  Surgeon: Dolores Frame, MD;  Location: [facility];  Service: Gastroenterology;  Laterality: N/A;  1130   cyst removed from right breast     LAPAROSCOPIC SALPINGO OOPHERECTOMY Right 04/09/2017   Procedure: LAPAROSCOPIC RIGHT SALPINGO OOPHORECTOMY; RIGHT OVARIAN BENIGN CYSTIC TERATOMA;  Surgeon: Lazaro Arms, MD;  Location: [facility];  Service: Gynecology;  Laterality: Right;   PARTIAL HYSTERECTOMY     TUBAL LIGATION     Patient Active Problem List   Diagnosis Date Noted   Sciatica of right side 02/27/2023   Osteoarthritis of hips, bilateral 09/24/2022   Cervicalgia 09/25/2020   Seasonal allergies 02/18/2020   Depression, major, single episode, in partial remission (HCC) 10/29/2019   Essential hypertension 01/01/2018   Prediabetes 03/30/2015   Annual physical exam 04/08/2014   Right hip pain 04/06/2014   Hyperlipemia 06/22/2008   GERD (gastroesophageal reflux disease) 05/24/2008   Allergic rhinitis 01/08/2008   OVERACTIVE BLADDER 04/10/2007   GAD (generalized anxiety disorder) 08/22/2006   OSTEOARTHRITIS 08/22/2006    REFERRING PROVIDER:   Huel Cote, MD  REFERRING DIAG: [phone] (ICD-10-CM) - Tear of right acetabular labrum, initial encounter  THERAPY DIAG:  Pain in right hip  Stiffness of right hip, not elsewhere classified  Muscle weakness (generalized)  Rationale for Evaluation and Treatment: Rehabilitation  ONSET DATE: November/December 2023  SUBJECTIVE:   SUBJECTIVE STATEMENT: Pt reports she had 8/10 pain this morning; took tylenol 2 hours prior to session.        PERTINENT HISTORY: Anxiety Back pain, lumbar spondylosis OA  PAIN:  Are you having pain? Yes Location: Rt ant hip into thigh NPRS:  5/10 current,  Description: ache, burning  PRECAUTIONS: Other: per dx  WEIGHT BEARING RESTRICTIONS: No  FALLS:  Has patient fallen in last 6 months? Yes. Number of falls 1 fall when she fainted  LIVING ENVIRONMENT: Lives with: lives with boyfriend and son Lives in: 1 story home Stairs: 4 steps to enter home with bilat rails, 8 steps on deck with bilat rails Has following equipment at home: None   PLOF: Independent  PATIENT GOALS: decrease pain, to be able to turn without pain, improved sleeping   OBJECTIVE:   DIAGNOSTIC FINDINGS:  (Per Epic) R hip MRI: IMPRESSION: 1. Mild degenerative changes at both hips with a small right hip joint effusion. No acute osseous findings. 2. Mild edema within the right hip adductor muscles, likely muscular strain. 3. Mild bilateral gluteus and left  common hamstring tendinosis without tear. 4. Lower lumbar spondylosis.  R hip x ray: IMPRESSION: Mild bilateral femoroacetabular osteoarthritis.   (Per MD note) R hip X ray: Very mild degenerative findings with some ossification of the labrum   R hip MRI: There does appear to be a degenerative anterior superior labral tear with 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   TODAY'S TREATMENT:                                                                                                                                Pt seen for aquatic therapy today.  Treatment took place in water 3.5-4.75 ft in depth at the [facility] pool. Temp of water was 91.  Pt entered/exited the pool via stairs alternating step-to and step-through pattern with bilat hand rail.   *walking forward, backward with barbell;  and side stepping without support  * UE on yellow noodle:  3 way toe tap ,alternating LEs x 8 each LE *Straddling noodle, UE support on barbell : cycling x 4 laps (cues for slowing speed of revolutions); suspended jumping jack LEs and cross country ski *STS from bench onto water step x 10, with 4 sec eccentric lowering and cues for hip hinge, forward arm reach and neutral head position * holding wall: R straight leg hip circles x 4 CW;  3 CCW * return to walking forward/ backward with reciprocal arm swing in 4 ft of water    Pt requires the buoyancy and hydrostatic pressure of water for support, and to offload joints by unweighting joint load by at least 50 % in navel deep water and by at least 75-80% in chest to neck deep water.  Viscosity of the water is needed for resistance of strengthening. Water current perturbations provides challenge to standing balance requiring increased core activation.   PATIENT EDUCATION:  Education details:  exercise rationale/ modifications, Person educated: Patient Education method: Explanation, demonstration, verbal cuing Education comprehension: verbalized understanding, returned demonstration, verbal cuing required  HOME EXERCISE PROGRAM: Pt has a land based HEP.   ASSESSMENT:  CLINICAL IMPRESSION:  Pt able to complete STS at bench with blue step under feet in water, without any production of pain.  She reported gradual reduction of pain to 0/10 when cycling while suspended on noodle.  Rt hip ROM gradually improving.  Pt has met STG #1 and 2.  Therapist to check FOTO next session, if time allows.   OBJECTIVE  IMPAIRMENTS: decreased activity tolerance, decreased mobility, decreased ROM, decreased strength, hypomobility, impaired flexibility, and pain.   ACTIVITY LIMITATIONS: lifting, sleeping, and turning  PARTICIPATION LIMITATIONS: meal prep and cleaning  PERSONAL FACTORS: 1 comorbidity: back pain  are also affecting patient's functional outcome.   REHAB POTENTIAL: Good  CLINICAL DECISION MAKING: Stable/uncomplicated  EVALUATION COMPLEXITY: Low   GOALS:  SHORT TERM GOALS: Target date: 7/23/204  Pt will tolerate aquatic therapy without adverse effects for improved pain,  strength, function, and tolerance to activity.  Baseline: Goal status: MET 06/02/23   2.  Pt will report at least a 25% improvement in pain and sx's overall.  Baseline:  Goal status: MET 06/02/23 (40% reported)  3.  Pt will report reduced pain at night and improved sleeping.  Baseline: pain continues at night, but sleeping has improved  Goal status: Partially met 06/02/23   4.  Pt will demo improved R hip AROM to at least 10-15 deg in abd for improved mobility.   Goal status:  INITIAL Target date:  05/27/2023    LONG TERM GOALS: Target date: 06/24/2023  Pt will report she is able to perform her normal community ambulation with good stability without her leg wanting to give way.  Baseline:  Goal status:IN progress 06/02/23  2.  Pt will perform 5x STS test in < 15 sec for improved functional LE strength.  Baseline:  Goal status: INITIAL  3.  Pt will report at least a 70% reduction in pain with turning/pivoting with daily activities.  Baseline:  Goal status: INITIAL  4.  Pt will demo 5/5 strength in R hip flexion and knee ext and 20# with HHD testing for hip abduction for improved tolerance to activity and performance of ADLs/IADLs.  Baseline:  Goal status: INITIAL  5.  Pt will be independent with HEP for improved pain, strength, ROM, and function.  Baseline:  Goal status:  INITIAL    PLAN:  PT FREQUENCY:   2 times per week  PT DURATION: Probably 5 weeks of PT, but will extend POC to 8 weeks due to clinic availablity  PLANNED INTERVENTIONS: Therapeutic exercises, Therapeutic activity, Neuromuscular re-education, Balance training, Gait training, Patient/Family education, Self Care, Joint mobilization, Stair training, Aquatic Therapy, Dry Needling, Electrical stimulation, Spinal mobilization, Cryotherapy, Moist heat, Taping, Ultrasound, Manual therapy, and Re-evaluation  PLAN FOR NEXT SESSION: Cont with land and aquatic therapy.  Mayer Camel, PTA 06/02/23 12:53 PM [facility] [address] [place], [place], [address] Phone: [phone]   Fax:  [phone]

## 2023-06-04 ENCOUNTER — Ambulatory Visit (HOSPITAL_BASED_OUTPATIENT_CLINIC_OR_DEPARTMENT_OTHER): Payer: Medicare Other | Admitting: Physical Therapy

## 2023-06-09 ENCOUNTER — Ambulatory Visit (HOSPITAL_BASED_OUTPATIENT_CLINIC_OR_DEPARTMENT_OTHER): Payer: Medicare Other | Admitting: Physical Therapy

## 2023-06-09 ENCOUNTER — Telehealth (HOSPITAL_BASED_OUTPATIENT_CLINIC_OR_DEPARTMENT_OTHER): Payer: Self-pay | Admitting: Physical Therapy

## 2023-06-09 NOTE — Telephone Encounter (Signed)
Spoke with pt in regards to missed visit.  She offers no reason.  She is made aware of next appt with Thurston Pounds on 8/14 @845am .  Also reminded of NS/late cancellation policy.  She VU.

## 2023-06-11 ENCOUNTER — Encounter (HOSPITAL_BASED_OUTPATIENT_CLINIC_OR_DEPARTMENT_OTHER): Payer: Self-pay | Admitting: Physical Therapy

## 2023-06-11 ENCOUNTER — Ambulatory Visit (HOSPITAL_BASED_OUTPATIENT_CLINIC_OR_DEPARTMENT_OTHER): Payer: Medicare Other | Admitting: Physical Therapy

## 2023-06-11 DIAGNOSIS — M6281 Muscle weakness (generalized): Secondary | ICD-10-CM

## 2023-06-11 DIAGNOSIS — M25551 Pain in right hip: Secondary | ICD-10-CM | POA: Diagnosis not present

## 2023-06-11 DIAGNOSIS — M25651 Stiffness of right hip, not elsewhere classified: Secondary | ICD-10-CM

## 2023-06-11 NOTE — Therapy (Signed)
OUTPATIENT PHYSICAL THERAPY TREATMENT NOTE    Patient Name: [name] MRN: [id number] DOB:07/21/1951, 72 y.o., female Today's Date: 06/12/2023  END OF SESSION:  PT End of Session - 06/11/23 0854     Visit Number 7    Number of Visits 10    Date for PT Re-Evaluation 06/24/23    Authorization Type BCBS MCR    PT Start Time 0850    PT Stop Time 0930    PT Time Calculation (min) 40 min    Activity Tolerance Patient tolerated treatment well    Behavior During Therapy [facility] for tasks assessed/performed                Past Medical History:  Diagnosis Date   Allergy    Anxiety    Back pain with radiation 07/13/2012   GERD (gastroesophageal reflux disease)    Glaucoma    Hyperlipidemia    Osteoarthritis    Past Surgical History:  Procedure Laterality Date   BREAST CYST EXCISION Right    COLONOSCOPY WITH PROPOFOL N/A 01/02/2022   Procedure: COLONOSCOPY WITH PROPOFOL;  Surgeon: Dolores Frame, MD;  Location: [facility];  Service: Gastroenterology;  Laterality: N/A;  1130   cyst removed from right breast     LAPAROSCOPIC SALPINGO OOPHERECTOMY Right 04/09/2017   Procedure: LAPAROSCOPIC RIGHT SALPINGO OOPHORECTOMY; RIGHT OVARIAN BENIGN CYSTIC TERATOMA;  Surgeon: Lazaro Arms, MD;  Location: [facility];  Service: Gynecology;  Laterality: Right;   PARTIAL HYSTERECTOMY     TUBAL LIGATION     Patient Active Problem List   Diagnosis Date Noted   Sciatica of right side 02/27/2023   Osteoarthritis of hips, bilateral 09/24/2022   Cervicalgia 09/25/2020   Seasonal allergies 02/18/2020   Depression, major, single episode, in partial remission (HCC) 10/29/2019   Essential hypertension 01/01/2018   Prediabetes 03/30/2015   Annual physical exam 04/08/2014   Right hip pain 04/06/2014   Hyperlipemia 06/22/2008   GERD (gastroesophageal reflux disease) 05/24/2008   Allergic rhinitis 01/08/2008   OVERACTIVE BLADDER 04/10/2007   GAD (generalized anxiety disorder)  08/22/2006   OSTEOARTHRITIS 08/22/2006    REFERRING PROVIDER:  Huel Cote, MD  REFERRING DIAG: [phone] (ICD-10-CM) - Tear of right acetabular labrum, initial encounter  THERAPY DIAG:  Pain in right hip  Stiffness of right hip, not elsewhere classified  Muscle weakness (generalized)  Rationale for Evaluation and Treatment: Rehabilitation  ONSET DATE: November/December 2023  SUBJECTIVE:   SUBJECTIVE STATEMENT: Pt states she felt good after prior land and aquatic therapy treatments.  Pt states she [place] this [place].  Pt reports she was a little stiff though didn't have increased pain.  Pt didn't even take any tylenol the whole weekend.  She has improved pain with ambulation.  Pt states she is feeling better.  Pt states the swelling in her R ankle is less.  Pt reports improved pain with turning though still has pain with turning.  Pt reports improved performance with sit to stands this weekend.  Pt reports 55% improvement in pain and sx's overall.   PERTINENT HISTORY: Anxiety Back pain, lumbar spondylosis OA  PAIN:  Are you having pain? Yes NPRS:  3/10 current, >10/10 worst, 4/10 best Worst pain is with sudden turning Location:  R sided groin and distal anterior thigh  PRECAUTIONS: Other: per dx  WEIGHT BEARING RESTRICTIONS: No  FALLS:  Has patient fallen in last 6 months? Yes. Number of falls 1 fall when she  fainted  LIVING ENVIRONMENT: Lives with: lives with boyfriend and son Lives in: 1 story home Stairs: 4 steps to enter home with bilat rails, 8 steps on deck with bilat rails Has following equipment at home: None   PLOF: Independent  PATIENT GOALS: decrease pain, to be able to turn without pain, improved sleeping   OBJECTIVE:   DIAGNOSTIC FINDINGS:  (Per Epic) R hip MRI: IMPRESSION: 1. Mild degenerative changes at both hips with a small right hip joint effusion. No acute osseous findings. 2. Mild edema within the  right hip adductor muscles, likely muscular strain. 3. 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 4. Lower lumbar spondylosis.  R hip x ray: IMPRESSION: Mild bilateral femoroacetabular osteoarthritis.   (Per MD note) R hip X ray: Very mild degenerative findings with some ossification of the labrum   R hip MRI: There does appear to be a degenerative anterior superior labral tear with 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   TODAY'S TREATMENT:                                                                                                                               Pt performed:  Therapeutic Exercise: Supine bridge x 10 reps and 2x10 with GTB around knees  Supine clam with RTB 3x10  LAQ 3# 2x10  Seated HS curl with RTB x10,x 7 reps  Sidestepping x 2 laps without bilat UE support at rail  Step ups on 4 inch step with rail x10    Neuro Re-ed Activities:  Standing with NBOS on airex x45 sec with UE support on rail toward the end   marching on airex with bilat UE support 2x10  Standing in staggered stance 2x25-30 sec with occasional/frequent UE support on rail with L/R LE back with SBA      PATIENT EDUCATION:  Education details:  exercise rationale, HEP, POC, relevant anatomy, exercise form, and dx.  Person educated: Patient Education method: Explanation, demonstration, verbal cuing Education comprehension: verbalized understanding, returned demonstration, verbal cuing required  HOME EXERCISE PROGRAM: Pt has a land based HEP.   ASSESSMENT:  CLINICAL IMPRESSION: Pt reports 55% improvement in pain and sx's overall.  Pt performed increased ambulation this weekend without increased pain.  She also reports improved pain with turning though still has pain with turning.  Pt continues to improve with tolerance for exercises and stability with balance exercises.  Pt was able to perform marching on airex with much  improved pain.  She had 1-2 occasions of pain but was able to complete exercise.  Pt responded well to Rx reporting improved pain from 3/10 before Rx to 2/10 after Rx.  OBJECTIVE IMPAIRMENTS: decreased activity tolerance, decreased mobility, decreased ROM, decreased strength, hypomobility, impaired flexibility, and pain.   ACTIVITY LIMITATIONS: lifting, sleeping, and turning  PARTICIPATION LIMITATIONS: meal prep and cleaning  PERSONAL FACTORS: 1 comorbidity: back pain  are also affecting patient's  functional outcome.   REHAB POTENTIAL: Good  CLINICAL DECISION MAKING: Stable/uncomplicated  EVALUATION COMPLEXITY: Low   GOALS:  SHORT TERM GOALS: Target date: 7/23/204  Pt will tolerate aquatic therapy without adverse effects for improved pain, strength, function, and tolerance to activity.  Baseline: Goal status: INITIAL  2.  Pt will report at least a 25% improvement in pain and sx's overall.  Baseline:  Goal status: INITIAL  3.  Pt will report reduced pain at night and improved sleeping.  Baseline:  Goal status: INITIAL Target date:  05/27/2023  4.  Pt will demo improved R hip AROM to at least 10-15 deg in abd for improved mobility.   Goal status:  INITIAL Target date:  05/27/2023    LONG TERM GOALS: Target date: 06/24/2023  Pt will report she is able to perform her normal community ambulation with good stability without her leg wanting to give way.  Baseline:  Goal status: INITIAL  2.  Pt will perform 5x STS test in < 15 sec for improved functional LE strength.  Baseline:  Goal status: INITIAL  3.  Pt will report at least a 70% reduction in pain with turning/pivoting with daily activities.  Baseline:  Goal status: INITIAL  4.  Pt will demo 5/5 strength in R hip flexion and knee ext and 20# with HHD testing for hip abduction for improved tolerance to activity and performance of ADLs/IADLs.  Baseline:  Goal status: INITIAL  5.  Pt will be independent with HEP for  improved pain, strength, ROM, and function.  Baseline:  Goal status:  INITIAL    PLAN:  PT FREQUENCY:  2 times per week  PT DURATION: Probable 5 weeks of PT, but will extend POC to 8 weeks due to clinic availablity  PLANNED INTERVENTIONS: Therapeutic exercises, Therapeutic activity, Neuromuscular re-education, Balance training, Gait training, Patient/Family education, Self Care, Joint mobilization, Stair training, Aquatic Therapy, Dry Needling, Electrical stimulation, Spinal mobilization, Cryotherapy, Moist heat, Taping, Ultrasound, Manual therapy, and Re-evaluation  PLAN FOR NEXT SESSION: Cont with land and aquatic therapy.   Audie Clear III PT, DPT 06/12/23 1:46 PM

## 2023-06-13 ENCOUNTER — Ambulatory Visit (HOSPITAL_BASED_OUTPATIENT_CLINIC_OR_DEPARTMENT_OTHER): Payer: Medicare Other

## 2023-06-15 ENCOUNTER — Other Ambulatory Visit: Payer: Self-pay | Admitting: Family Medicine

## 2023-06-16 ENCOUNTER — Other Ambulatory Visit (HOSPITAL_BASED_OUTPATIENT_CLINIC_OR_DEPARTMENT_OTHER): Payer: Self-pay

## 2023-06-16 ENCOUNTER — Ambulatory Visit (INDEPENDENT_AMBULATORY_CARE_PROVIDER_SITE_OTHER): Payer: Medicare Other | Admitting: Orthopaedic Surgery

## 2023-06-16 ENCOUNTER — Ambulatory Visit (HOSPITAL_BASED_OUTPATIENT_CLINIC_OR_DEPARTMENT_OTHER): Payer: Self-pay | Admitting: Orthopaedic Surgery

## 2023-06-16 DIAGNOSIS — S73191A Other sprain of right hip, initial encounter: Secondary | ICD-10-CM

## 2023-06-16 MED ORDER — IBUPROFEN 800 MG PO TABS
800.0000 mg | ORAL_TABLET | Freq: Three times a day (TID) | ORAL | 0 refills | Status: AC
  Filled 2023-06-16: qty 30, 10d supply, fill #0

## 2023-06-16 MED ORDER — ACETAMINOPHEN 500 MG PO TABS
500.0000 mg | ORAL_TABLET | Freq: Three times a day (TID) | ORAL | 0 refills | Status: AC
  Filled 2023-06-16: qty 30, 10d supply, fill #0

## 2023-06-16 MED ORDER — ASPIRIN 325 MG PO TBEC
325.0000 mg | DELAYED_RELEASE_TABLET | Freq: Every day | ORAL | 0 refills | Status: DC
  Filled 2023-06-16: qty 14, 14d supply, fill #0

## 2023-06-16 MED ORDER — OXYCODONE HCL 5 MG PO TABS
5.0000 mg | ORAL_TABLET | ORAL | 0 refills | Status: DC | PRN
  Filled 2023-06-16: qty 15, 3d supply, fill #0

## 2023-06-16 NOTE — Progress Notes (Signed)
Chief Complaint: Right hip pain     History of Present Illness:   06/16/2023: Presents today for follow-up.  Recently been seen by Dr. Magnus Ivan who does not believe that she would be a candidate for hip arthroplasty.  She is here today for further discussion of the right hip.   [name] is a 72 y.o. female with right hip joint which has been going on for the last 3 to 4 months.  She is here today for further discussion.  She has been seeing Dr. Thane Edu this for this and performed an injection of the right hip.  She states that she got approximately 1 relief of good relief from this.  She has been experiencing swelling in the right lower extremity which does correlate somewhat with activity level.  She is continuing to be active and she is taking Mobic to relieve her pain.  This does help somewhat.  She has worse pain at the end of the day.    Surgical History:   None  PMH/PSH/Family History/Social History/Meds/Allergies:    Past Medical History:  Diagnosis Date   Allergy    Anxiety    Back pain with radiation 07/13/2012   GERD (gastroesophageal reflux disease)    Glaucoma    Hyperlipidemia    Osteoarthritis    Past Surgical History:  Procedure Laterality Date   BREAST CYST EXCISION Right    COLONOSCOPY WITH PROPOFOL N/A 01/02/2022   Procedure: COLONOSCOPY WITH PROPOFOL;  Surgeon: Dolores Frame, MD;  Location: [facility];  Service: Gastroenterology;  Laterality: N/A;  1130   cyst removed from right breast     LAPAROSCOPIC SALPINGO OOPHERECTOMY Right 04/09/2017   Procedure: LAPAROSCOPIC RIGHT SALPINGO OOPHORECTOMY; RIGHT OVARIAN BENIGN CYSTIC TERATOMA;  Surgeon: Lazaro Arms, MD;  Location: [facility];  Service: Gynecology;  Laterality: Right;   PARTIAL HYSTERECTOMY     TUBAL LIGATION     Social History   Socioeconomic History   Marital status: Single    Spouse name: Not on file   Number of children: 3   Years of  education: 12   Highest education level: 12th grade  Occupational History   Not on file  Tobacco Use   Smoking status: Never   Smokeless tobacco: Never  Vaping Use   Vaping status: Never Used  Substance and Sexual Activity   Alcohol use: Not Currently    Comment: occasionally wine   Drug use: No   Sexual activity: Not Currently    Birth control/protection: Surgical    Comment: hyst  Other Topics Concern   Not on file  Social History Narrative   Not on file   Social Determinants of Health   Financial Resource Strain: Low Risk  (01/17/2023)   Overall Financial Resource Strain (CARDIA)    Difficulty of Paying Living Expenses: Not hard at all  Food Insecurity: No Food Insecurity (01/17/2023)   Hunger Vital Sign    Worried About Running Out of Food in the Last Year: Never true    Ran Out of Food in the Last Year: Never true  Transportation Needs: No Transportation Needs (01/17/2023)   PRAPARE - Administrator, Civil Service (Medical): No    Lack of Transportation (Non-Medical): No  Physical Activity: Sufficiently Active (01/17/2023)   Exercise Vital Sign  Days of Exercise per Week: 7 days    Minutes of Exercise per Session: 60 min  Stress: Stress Concern Present (01/17/2023)   [employer] of Occupational Health - Occupational Stress Questionnaire    Feeling of Stress : To some extent  Social Connections: Unknown (01/17/2023)   Social Connection and Isolation Panel [NHANES]    Frequency of Communication with Friends and Family: More than three times a week    Frequency of Social Gatherings with Friends and Family: More than three times a week    Attends Religious Services: More than 4 times per year    Active Member of [employer] or Organizations: Yes    Attends Engineer, structural: More than 4 times per year    Marital Status: Patient unable to answer   Family History  Problem Relation Age of Onset   Heart disease Mother    Heart disease Father     Diabetes Father    Hypertension Sister    Parkinson's disease Brother    Heart disease Maternal Grandmother    Cancer Paternal Grandmother        breast   Diabetes Paternal Grandfather    Hypertension Sister    Hypertension Sister    Down syndrome Son    Eczema Son    Post-traumatic stress disorder Daughter    Allergies  Allergen Reactions   Ibuprofen Palpitations and Other (See Comments)    Rapid heart rate.   Current Outpatient Medications  Medication Sig Dispense Refill   acetaminophen (TYLENOL) 500 MG tablet Take 1 tablet (500 mg total) by mouth every 8 (eight) hours for 10 days. 30 tablet 0   aspirin EC 325 MG tablet Take 1 tablet (325 mg total) by mouth daily. 14 tablet 0   ibuprofen (ADVIL) 800 MG tablet Take 1 tablet (800 mg total) by mouth every 8 (eight) hours for 10 days. Please take with food, please alternate with acetaminophen 30 tablet 0   oxyCODONE (ROXICODONE) 5 MG immediate release tablet Take 1 tablet (5 mg total) by mouth every 4 (four) hours as needed for severe pain or breakthrough pain. 15 tablet 0   amLODipine (NORVASC) 5 MG tablet TAKE ONE TABLET BY MOUTH EVERY DAY 30 tablet 0   Ascorbic Acid (VITAMIN C PO) Take 1 tablet by mouth daily. Power C     citalopram (CELEXA) 40 MG tablet TAKE 1 TABLET BY MOUTH ONCE DAILY. 30 tablet 0   clotrimazole-betamethasone (LOTRISONE) cream Apply 1 application topically 2 (two) times daily. 45 g 1   ELDERBERRY PO Take 2 capsules by mouth daily.     fluticasone (FLONASE) 50 MCG/ACT nasal spray Place 2 sprays into both nostrils daily. Can do in the morning, or do one spray in more and one at night. 16 g 2   gabapentin (NEURONTIN) 300 MG capsule TAKE (1) CAPSULE BY MOUTH AT BEDTIME. 90 capsule 0   loratadine (ALLERGY RELIEF) 10 MG tablet TAKE (1) TABLET BY MOUTH ONCE DAILY AS NEEDED FOR ALLERGIES. 90 tablet 0   Omega-3 Fatty Acids (FISH OIL CONCENTRATE PO) Take 1,576 mg by mouth daily. 788 mg each     oxybutynin (DITROPAN) 5 MG  tablet TAKE (1) TABLET BY MOUTH TWICE DAILY. 60 tablet 2   pantoprazole (PROTONIX) 20 MG tablet TAKE 1 TABLET BY MOUTH ONCE DAILY. 30 tablet 0   rosuvastatin (CRESTOR) 40 MG tablet TAKE 1 TABLET BY MOUTH ONCE DAILY. 90 tablet 0   vitamin B-12 (CYANOCOBALAMIN) 1000 MCG tablet Take 1,000  mcg by mouth daily.     Vitamin E 400 units TABS Take 400 Units by mouth daily.     No current facility-administered medications for this visit.   No results found.  Review of Systems:   A ROS was performed including pertinent positives and negatives as documented in the HPI.  Physical Exam :   Constitutional: NAD and appears stated age Neurological: Alert and oriented Psych: Appropriate affect and cooperative There were no vitals taken for this visit.   Comprehensive Musculoskeletal Exam:    Inspection Right Left  Skin No atrophy or gross abnormalities appreciated No atrophy or gross abnormalities appreciated  Palpation    Tenderness None None  Crepitus None None  Range of Motion    Flexion (passive) 120 120  Extension 30 30  IR 30 with pain 40  ER 45 45  Strength    Flexion  5/5 5/5  Extension 5/5 5/5  Special Tests    FABER Negative Negative  FADIR Negative Negative  ER Lag/Capsular Insufficiency Negative Negative  Instability Negative Negative  Sacroiliac pain Negative  Negative   Instability    Generalized Laxity No No  Neurologic    sciatic, femoral, obturator nerves intact to light sensation  Vascular/Lymphatic    DP pulse 2+ 2+  Lumbar Exam    Patient has symmetric lumbar range of motion with negative pain referral to hip     Imaging:   Xray (4 views right hip): Very mild degenerative findings with some ossification of the labrum  MRI (right hip): There does appear to be a degenerative anterior superior labral tear with 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  I personally reviewed and interpreted the radiographs.   Assessment:    72 y.o. 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  At this time she has a trial of both land and aquatic therapy as well as an injection none of which have given her relief.  She has been seen by my partner Dr. Magnus Ivan who does not necessarily believe that she would be a good candidate for hip arthroplasty.  At this time she is hoping to undergo any type of intervention that would give her a prolonged pain relief.  Given that I did discuss hip arthroscopy.  I did discuss that it is somewhat atypical to consider this intervention at the age of 72 although that being said she does have mild tonus grade 1 changes and I do believe that ultimately she would benefit from hip arthroscopy.  I did discuss the risks and limitations.  After long discussion she is elected for this.  Plan :    -Plan for right hip arthroscopy with labral repair   After a lengthy discussion of treatment options, including risks, benefits, alternatives, complications of surgical and nonsurgical conservative options, the patient elected surgical repair.   The patient  is aware of the material risks  and complications including, but not limited to injury to adjacent structures, neurovascular injury, infection, numbness, bleeding, implant failure, thermal burns, stiffness, persistent pain, failure to heal, disease transmission from allograft, need for further surgery, dislocation, anesthetic risks, blood clots, risks of death,and others. The probabilities of surgical success and failure discussed with patient given their particular co-morbidities.The time and nature of expected rehabilitation and recovery was discussed.The patient's questions were all answered preoperatively.  No barriers to understanding were noted. I explained the natural history of the disease process and Rx  rationale.  I explained to the patient what I considered to be reasonable expectations given  their personal situation.  The final treatment plan was arrived at through a shared patient decision making process model.     I personally saw and evaluated the patient, and participated in the management and treatment plan.  Huel Cote, MD Attending Physician, Orthopedic Surgery  This document was dictated using Dragon voice recognition software. A reasonable attempt at proof reading has been made to minimize errors.

## 2023-06-25 ENCOUNTER — Encounter: Payer: Self-pay | Admitting: Family Medicine

## 2023-06-25 ENCOUNTER — Ambulatory Visit (INDEPENDENT_AMBULATORY_CARE_PROVIDER_SITE_OTHER): Payer: Medicare Other | Admitting: Family Medicine

## 2023-06-25 VITALS — BP 122/77 | HR 85 | Ht 65.0 in | Wt 161.1 lb

## 2023-06-25 DIAGNOSIS — Z23 Encounter for immunization: Secondary | ICD-10-CM

## 2023-06-25 DIAGNOSIS — R9431 Abnormal electrocardiogram [ECG] [EKG]: Secondary | ICD-10-CM

## 2023-06-25 DIAGNOSIS — J302 Other seasonal allergic rhinitis: Secondary | ICD-10-CM

## 2023-06-25 DIAGNOSIS — I498 Other specified cardiac arrhythmias: Secondary | ICD-10-CM

## 2023-06-25 DIAGNOSIS — R002 Palpitations: Secondary | ICD-10-CM

## 2023-06-25 DIAGNOSIS — Z01810 Encounter for preprocedural cardiovascular examination: Secondary | ICD-10-CM

## 2023-06-25 DIAGNOSIS — R55 Syncope and collapse: Secondary | ICD-10-CM

## 2023-06-25 DIAGNOSIS — Z01818 Encounter for other preprocedural examination: Secondary | ICD-10-CM

## 2023-06-25 MED ORDER — FLUTICASONE PROPIONATE 50 MCG/ACT NA SUSP: 2.0000 | Freq: Every day | NASAL | 5 refills | Status: DC

## 2023-06-25 NOTE — Patient Instructions (Addendum)
F/U in January, call if you need me sooner  Flu vaccine today  CBC, and chem 7 today  EKG in office today  Once results are in you will be cleared for surgery, all the best  Fasting lipid, cmp and EGFR 3 to 5 days before Jan appt  Thanks for choosing [facility], we consider it a privelige to serve you.

## 2023-06-25 NOTE — Progress Notes (Signed)
   [name]     MRN: [id number]      DOB: December 28, 1950  Chief Complaint  Patient presents with   Follow-up    Pre op hip sx     HPI [name] is here for pre op exam Her main c/o pain in the right groin and she is anxious to have surgery  as she is severely limited in mobility and ha chronic debilitating pain  ROS Denies recent fever or chills. Denies sinus pressure, nasal congestion, ear pain or sore throat. Denies chest congestion, productive cough or wheezing. Denies chest pains, c/o intermittent palpitations and had a syncopal episode which took her to the ED approx 9 weeks ago, 04/15/2023 Denies abdominal pain, nausea, vomiting,diarrhea or constipation.   Denies dysuria, frequency, hesitancy or incontinence. Denies headaches, seizures, numbness, or tingling. Denies uncontrolled  depression, anxiety or insomnia. Denies skin break down or rash.   PE  BP 122/77 (BP Location: Right Arm, Patient Position: Sitting, Cuff Size: Normal)   Pulse 85   Ht 5\' 5"  (1.651 m)   Wt 161 lb 1.9 oz (73.1 kg)   SpO2 92%   BMI 26.81 kg/m   Patient alert and oriented and in no cardiopulmonary distress.  HEENT: No facial asymmetry, EOMI,     Neck supple .  Chest: Clear to auscultation bilaterally.  CVS: S1, S2 no murmurs, no S3.Regular rate. EKG: bradycardia with possible a fib ( noted after pt left the office) ABD: Soft non tender.   Ext: No edema  MS: Adequate ROM spine, shoulders,  and knees.Decreased in right hip  Skin: Intact, no ulcerations or rash noted.  Psych: Good eye contact, normal affect. Memory intact not anxious or depressed appearing.  CNS: CN 2-12 intact, power,  normal throughout.no focal deficits noted.   Assessment & Plan Pre-op evaluation Meds reviewed and are accurate.History and exam as documented. Pt sent to ED the following day because of abnormal EKG, she denied palpitations or light headedness at both times that I spoke to her. In ED her EKG is normal ,  she still needs cardiology eval before clearance  Abnormal EKG Abnormal EKG in office suggestive of a fib with HR in 30's, in ED the following day EKG normal, refer Cardiology  Intermittent palpitations H/o intermittent palpitations , with possible a fib, abn EKG, cardiology to evaluate   Syncope Syncopal episode in June, first eval since and abn EKG needs Cardiology eval asap

## 2023-06-26 ENCOUNTER — Emergency Department (HOSPITAL_COMMUNITY)
Admission: EM | Admit: 2023-06-26 | Discharge: 2023-06-26 | Disposition: A | Discharge status: Home / Self Care | Payer: Medicare Other | Attending: Emergency Medicine | Admitting: Emergency Medicine

## 2023-06-26 ENCOUNTER — Other Ambulatory Visit: Payer: Self-pay

## 2023-06-26 ENCOUNTER — Encounter (HOSPITAL_COMMUNITY): Payer: Self-pay | Admitting: Emergency Medicine

## 2023-06-26 DIAGNOSIS — R9431 Abnormal electrocardiogram [ECG] [EKG]: Secondary | ICD-10-CM | POA: Diagnosis not present

## 2023-06-26 DIAGNOSIS — Z79899 Other long term (current) drug therapy: Secondary | ICD-10-CM | POA: Diagnosis not present

## 2023-06-26 DIAGNOSIS — Z7982 Long term (current) use of aspirin: Secondary | ICD-10-CM | POA: Insufficient documentation

## 2023-06-26 LAB — CBC WITH DIFFERENTIAL/PLATELET
Abs Immature Granulocytes: 0.01 10*3/uL (ref 0.00–0.07)
Basophils Absolute: 0 10*3/uL (ref 0.0–0.1)
Basophils Relative: 0 %
Eosinophils Absolute: 0.3 10*3/uL (ref 0.0–0.5)
Eosinophils Relative: 4 %
HCT: 37.3 % (ref 36.0–46.0)
Hemoglobin: 12.1 g/dL (ref 12.0–15.0)
Immature Granulocytes: 0 %
Lymphocytes Relative: 44 %
Lymphs Abs: 3.5 10*3/uL (ref 0.7–4.0)
MCH: 31 pg (ref 26.0–34.0)
MCHC: 32.4 g/dL (ref 30.0–36.0)
MCV: 95.6 fL (ref 80.0–100.0)
Monocytes Absolute: 0.6 10*3/uL (ref 0.1–1.0)
Monocytes Relative: 8 %
Neutro Abs: 3.4 10*3/uL (ref 1.7–7.7)
Neutrophils Relative %: 44 %
Platelets: 296 10*3/uL (ref 150–400)
RBC: 3.9 MIL/uL (ref 3.87–5.11)
RDW: 13.6 % (ref 11.5–15.5)
WBC: 7.8 10*3/uL (ref 4.0–10.5)
nRBC: 0 % (ref 0.0–0.2)

## 2023-06-26 LAB — BMP8+EGFR
BUN/Creatinine Ratio: 11 — ABNORMAL LOW (ref 12–28)
BUN: 10 mg/dL (ref 8–27)
CO2: 22 mmol/L (ref 20–29)
Calcium: 10.3 mg/dL (ref 8.7–10.3)
Chloride: 99 mmol/L (ref 96–106)
Creatinine, Ser: 0.93 mg/dL (ref 0.57–1.00)
Glucose: 105 mg/dL — ABNORMAL HIGH (ref 70–99)
Potassium: 4.2 mmol/L (ref 3.5–5.2)
Sodium: 136 mmol/L (ref 134–144)
eGFR: 66 mL/min/{1.73_m2} (ref >59)

## 2023-06-26 LAB — CBC
Hematocrit: 35.4 % (ref 34.0–46.6)
Hemoglobin: 11.6 g/dL (ref 11.1–15.9)
MCH: 30.4 pg (ref 26.6–33.0)
MCHC: 32.8 g/dL (ref 31.5–35.7)
MCV: 93 fL (ref 79–97)
Platelets: 289 10*3/uL (ref 150–450)
RBC: 3.81 x10E6/uL (ref 3.77–5.28)
RDW: 13.1 % (ref 11.7–15.4)
WBC: 8.7 10*3/uL (ref 3.4–10.8)

## 2023-06-26 LAB — BASIC METABOLIC PANEL
Anion gap: 9 (ref 5–15)
BUN: 8 mg/dL (ref 8–23)
CO2: 28 mmol/L (ref 22–32)
Calcium: 9.7 mg/dL (ref 8.9–10.3)
Chloride: 99 mmol/L (ref 98–111)
Creatinine, Ser: 0.87 mg/dL (ref 0.44–1.00)
GFR, Estimated: >60 mL/min (ref >60)
Glucose, Bld: 97 mg/dL (ref 70–99)
Potassium: 3.7 mmol/L (ref 3.5–5.1)
Sodium: 136 mmol/L (ref 135–145)

## 2023-06-26 LAB — TROPONIN I (HIGH SENSITIVITY)
Troponin I (High Sensitivity): 2 ng/L (ref <18)
Troponin I (High Sensitivity): 3 ng/L (ref <18)

## 2023-06-26 NOTE — ED Notes (Signed)
Pt sent from PCP for abnormal EKG  Pt seen by PCP yesterday for pre-op clearance and told to come to ED  Pt denies CP and SOB   Vitals listed  NSR on monitor

## 2023-06-26 NOTE — Discharge Instructions (Signed)
Evaluation today was overall reassuring.  EKG was normal for [place].  Recommend you follow-up with your PCP and your preoperative team for surgical planning and if there are concerns about your heart is possible cardiology may be involved.  At this time however you look clinically well and there are no acute or emergent issues that you are safe to be discharged from the emergency department.  If you develop chest pain, shortness of breath or palpitations please return for further evaluation.

## 2023-06-26 NOTE — ED Provider Notes (Signed)
[place] EMERGENCY DEPARTMENT AT [facility] Provider Note   CSN: [id number] Arrival date & time: 06/26/23  1225     History  Chief Complaint  Patient presents with   Abnormal ECG   HPI [name] is a 72 y.o. female with hyperlipidemia and GERD presenting for abnormal EKG.  States she was seeing her PCP Dr. Lodema Hong for a preoperative evaluation yesterday was found to have an "abnormal EKG".  Was encouraged by PCP to come to the ED for further evaluation.  At this time patient has no medical complaints denies chest pain, shortness of breath or palpitations.  HPI     Home Medications Prior to Admission medications   Medication Sig Start Date End Date Taking? Authorizing Provider  acetaminophen (TYLENOL) 500 MG tablet Take 1 tablet (500 mg total) by mouth every 8 (eight) hours for 10 days. 06/16/23 06/26/23  Huel Cote, MD  amLODipine (NORVASC) 5 MG tablet TAKE ONE TABLET BY MOUTH EVERY DAY 06/16/23   Kerri Perches, MD  Ascorbic Acid (VITAMIN C PO) Take 1 tablet by mouth daily. Power C    [provider]  aspirin EC 325 MG tablet Take 1 tablet (325 mg total) by mouth daily. 06/16/23   Huel Cote, MD  citalopram (CELEXA) 40 MG tablet TAKE 1 TABLET BY MOUTH ONCE DAILY. 06/02/23   Kerri Perches, MD  ELDERBERRY PO Take 2 capsules by mouth daily.    [provider]  fluticasone (FLONASE) 50 MCG/ACT nasal spray Place 2 sprays into both nostrils daily. Can do in the morning, or do one spray in more and one at night. 06/25/23   Kerri Perches, MD  gabapentin (NEURONTIN) 300 MG capsule TAKE (1) CAPSULE BY MOUTH AT BEDTIME. 04/10/23   Kerri Perches, MD  ibuprofen (ADVIL) 800 MG tablet Take 1 tablet (800 mg total) by mouth every 8 (eight) hours for 10 days. Please take with food, please alternate with acetaminophen 06/16/23 06/26/23  Huel Cote, MD  loratadine (ALLERGY RELIEF) 10 MG tablet TAKE (1) TABLET BY MOUTH ONCE DAILY AS NEEDED FOR  ALLERGIES. 06/16/23   Kerri Perches, MD  Omega-3 Fatty Acids (FISH OIL CONCENTRATE PO) Take 1,576 mg by mouth daily. 788 mg each    [provider]  oxybutynin (DITROPAN) 5 MG tablet TAKE (1) TABLET BY MOUTH TWICE DAILY. 05/30/23   Kerri Perches, MD  oxyCODONE (ROXICODONE) 5 MG immediate release tablet Take 1 tablet (5 mg total) by mouth every 4 (four) hours as needed for severe pain or breakthrough pain. 06/16/23   Huel Cote, MD  pantoprazole (PROTONIX) 20 MG tablet TAKE 1 TABLET BY MOUTH ONCE DAILY. 05/26/23   Kerri Perches, MD  rosuvastatin (CRESTOR) 40 MG tablet TAKE 1 TABLET BY MOUTH ONCE DAILY. 06/16/23 07/16/23  Kerri Perches, MD  vitamin B-12 (CYANOCOBALAMIN) 1000 MCG tablet Take 1,000 mcg by mouth daily.    [provider]  Vitamin E 400 units TABS Take 400 Units by mouth daily.    [provider]      Allergies    Ibuprofen    Review of Systems   See HPI for pertinent positives  Physical Exam Updated Vital Signs BP 117/84 (BP Location: Right Arm)   Pulse 67   Temp 98.2 F (36.8 C) (Oral)   Resp 13   Ht 5\' 5"  (1.651 m)   Wt 72.6 kg   SpO2 100%   BMI 26.63 kg/m  Physical Exam Vitals  and nursing note reviewed.  HENT:     Head: Normocephalic and atraumatic.     Mouth/Throat:     Mouth: Mucous membranes are moist.  Eyes:     General:        Right eye: No discharge.        Left eye: No discharge.     Conjunctiva/sclera: Conjunctivae normal.  Cardiovascular:     Rate and Rhythm: Normal rate and regular rhythm.     Pulses: Normal pulses.     Heart sounds: Normal heart sounds.  Pulmonary:     Effort: Pulmonary effort is normal.     Breath sounds: Normal breath sounds.  Abdominal:     General: Abdomen is flat.     Palpations: Abdomen is soft.  Skin:    General: Skin is warm and dry.  Neurological:     General: No focal deficit present.  Psychiatric:        Mood and Affect: Mood normal.     ED Results /  Procedures / Treatments   Labs (all labs ordered are listed, but only abnormal results are displayed) Labs Reviewed  CBC WITH DIFFERENTIAL/PLATELET  BASIC METABOLIC PANEL  TROPONIN I (HIGH SENSITIVITY)  TROPONIN I (HIGH SENSITIVITY)    EKG EKG Interpretation Date/Time:  Thursday June 26 2023 13:15:11 EDT Ventricular Rate:  77 PR Interval:  148 QRS Duration:  80 QT Interval:  372 QTC Calculation: 420 R Axis:   -10  Text Interpretation: Normal sinus rhythm Confirmed by Cathren Laine ([id number]) on 06/26/2023 1:26:18 PM  Radiology No results found.  Procedures Procedures    Medications Ordered in ED Medications - No data to display  ED Course/ Medical Decision Making/ A&P                                 Medical Decision Making Amount and/or Complexity of Data Reviewed Labs: ordered.   72 year old well-appearing female presenting for abnormal EKG.  Exam was unremarkable.  Patient has no other presenting medical complaints.  Workup was also reassuring.  EKG today revealed normal sinus rhythm.  Patient discussed concern for preop clearance.  Advised her to follow-up with your PCP and preoperative team for medical clearance.  Is possible she may need involvement with cardiology but at this time there are no emergent or acute issues send this is appropriate and safe for discharge.  Vitals are stable.  Discussed pertinent return precautions.  Discharged home in good condition.        Final Clinical Impression(s) / ED Diagnoses Final diagnoses:  Abnormal EKG    Rx / DC Orders ED Discharge Orders     None         Gareth Eagle, PA-C 06/26/23 1615    Vanetta Mulders, MD 06/27/23 [phone]

## 2023-06-26 NOTE — ED Triage Notes (Signed)
Per pt seen by Dr. Lodema Hong on yesterday for check-up and said she needed a repeat EKG, possible Bradycardia.

## 2023-07-02 ENCOUNTER — Telehealth (HOSPITAL_BASED_OUTPATIENT_CLINIC_OR_DEPARTMENT_OTHER): Payer: Self-pay | Admitting: Physical Therapy

## 2023-07-02 ENCOUNTER — Ambulatory Visit (HOSPITAL_BASED_OUTPATIENT_CLINIC_OR_DEPARTMENT_OTHER): Payer: Medicare Other | Attending: Orthopaedic Surgery | Admitting: Physical Therapy

## 2023-07-02 DIAGNOSIS — Z8249 Family history of ischemic heart disease and other diseases of the circulatory system: Secondary | ICD-10-CM | POA: Insufficient documentation

## 2023-07-02 DIAGNOSIS — R55 Syncope and collapse: Secondary | ICD-10-CM | POA: Insufficient documentation

## 2023-07-02 DIAGNOSIS — M25651 Stiffness of right hip, not elsewhere classified: Secondary | ICD-10-CM | POA: Insufficient documentation

## 2023-07-02 DIAGNOSIS — M6281 Muscle weakness (generalized): Secondary | ICD-10-CM | POA: Insufficient documentation

## 2023-07-02 DIAGNOSIS — M25551 Pain in right hip: Secondary | ICD-10-CM | POA: Insufficient documentation

## 2023-07-02 NOTE — Telephone Encounter (Signed)
Patient no show, spoke with patient who got her days mixed up. Scheduled appointment for next week as she did not have any appointments remaining.  11:29 AM, 07/02/23 Wyman Songster PT, DPT Physical Therapist at [facility]

## 2023-07-06 DIAGNOSIS — Z01818 Encounter for other preprocedural examination: Secondary | ICD-10-CM | POA: Insufficient documentation

## 2023-07-06 DIAGNOSIS — R55 Syncope and collapse: Secondary | ICD-10-CM | POA: Insufficient documentation

## 2023-07-06 DIAGNOSIS — R9431 Abnormal electrocardiogram [ECG] [EKG]: Secondary | ICD-10-CM | POA: Insufficient documentation

## 2023-07-06 DIAGNOSIS — R002 Palpitations: Secondary | ICD-10-CM | POA: Insufficient documentation

## 2023-07-06 NOTE — Assessment & Plan Note (Signed)
Abnormal EKG in office suggestive of a fib with HR in 30's, in ED the following day EKG normal, refer Cardiology

## 2023-07-06 NOTE — Assessment & Plan Note (Signed)
H/o intermittent palpitations , with possible a fib, abn EKG, cardiology to evaluate

## 2023-07-06 NOTE — Assessment & Plan Note (Signed)
Meds reviewed and are accurate.History and exam as documented. Pt sent to ED the following day because of abnormal EKG, she denied palpitations or light headedness at both times that I spoke to her. In ED her EKG is normal , she still needs cardiology eval before clearance

## 2023-07-06 NOTE — Assessment & Plan Note (Signed)
Syncopal episode in June, first eval since and abn EKG needs Cardiology eval asap

## 2023-07-07 ENCOUNTER — Ambulatory Visit (INDEPENDENT_AMBULATORY_CARE_PROVIDER_SITE_OTHER): Payer: Medicare Other | Admitting: Internal Medicine

## 2023-07-07 ENCOUNTER — Encounter: Payer: Self-pay | Admitting: Internal Medicine

## 2023-07-07 VITALS — BP 120/84 | HR 87 | Ht 64.0 in | Wt 162.2 lb

## 2023-07-07 DIAGNOSIS — Z8249 Family history of ischemic heart disease and other diseases of the circulatory system: Secondary | ICD-10-CM | POA: Diagnosis not present

## 2023-07-07 DIAGNOSIS — R55 Syncope and collapse: Secondary | ICD-10-CM

## 2023-07-07 NOTE — Patient Instructions (Addendum)
Medication Instructions:  Your physician recommends that you continue on your current medications as directed. Please refer to the Current Medication list given to you today.   Labwork: Lipoprotein-a to be completed at [facility] in [place] or [facility]  Testing/Procedures: Your physician has requested that you have an echocardiogram. Echocardiography is a painless test that uses sound waves to create images of your heart. It provides your doctor with information about the size and shape of your heart and how well your heart's chambers and valves are working. This procedure takes approximately one hour. There are no restrictions for this procedure. Please do NOT wear cologne, perfume, aftershave, or lotions (deodorant is allowed). Please arrive 15 minutes prior to your appointment time.   Follow-Up: Your physician recommends that you schedule a follow-up appointment in: Pending Results  Any Other Special Instructions Will Be Listed Below (If Applicable).. Please contact the office if you have any more Syncope spells.  If you need a refill on your cardiac medications before your next appointment, please call your pharmacy.

## 2023-07-08 ENCOUNTER — Other Ambulatory Visit (HOSPITAL_BASED_OUTPATIENT_CLINIC_OR_DEPARTMENT_OTHER): Payer: Self-pay

## 2023-07-08 ENCOUNTER — Ambulatory Visit (HOSPITAL_BASED_OUTPATIENT_CLINIC_OR_DEPARTMENT_OTHER): Payer: Medicare Other | Admitting: Physical Therapy

## 2023-07-08 ENCOUNTER — Encounter (HOSPITAL_BASED_OUTPATIENT_CLINIC_OR_DEPARTMENT_OTHER): Payer: Self-pay | Admitting: Physical Therapy

## 2023-07-08 DIAGNOSIS — Z8249 Family history of ischemic heart disease and other diseases of the circulatory system: Secondary | ICD-10-CM | POA: Diagnosis not present

## 2023-07-08 DIAGNOSIS — M25551 Pain in right hip: Secondary | ICD-10-CM

## 2023-07-08 DIAGNOSIS — M6281 Muscle weakness (generalized): Secondary | ICD-10-CM | POA: Diagnosis not present

## 2023-07-08 DIAGNOSIS — M25651 Stiffness of right hip, not elsewhere classified: Secondary | ICD-10-CM

## 2023-07-08 DIAGNOSIS — R55 Syncope and collapse: Secondary | ICD-10-CM | POA: Diagnosis not present

## 2023-07-08 MED ORDER — COMIRNATY 30 MCG/0.3ML IM SUSY
0.3000 mL | PREFILLED_SYRINGE | Freq: Once | INTRAMUSCULAR | 0 refills | Status: AC
  Filled 2023-07-08: qty 0.3, 1d supply, fill #0

## 2023-07-08 NOTE — Therapy (Signed)
OUTPATIENT PHYSICAL THERAPY TREATMENT NOTE    Patient Name: [name] MRN: [id number] DOB:11-Jun-1951, 72 y.o., female Today's Date: 07/08/2023  Progress Note   Reporting Period 04/30/23 to 07/08/23   See note below for Objective Data and Assessment of Progress/Goals   END OF SESSION:  PT End of Session - 07/08/23 0845     Visit Number 8    Number of Visits 22    Date for PT Re-Evaluation 08/19/23    Authorization Type BCBS MCR    PT Start Time 0845    PT Stop Time 0925    PT Time Calculation (min) 40 min    Activity Tolerance Patient tolerated treatment well    Behavior During Therapy [facility] for tasks assessed/performed                Past Medical History:  Diagnosis Date   Allergy    Anxiety    Back pain with radiation 07/13/2012   GERD (gastroesophageal reflux disease)    Glaucoma    Hyperlipidemia    Osteoarthritis    Past Surgical History:  Procedure Laterality Date   BREAST CYST EXCISION Right    COLONOSCOPY WITH PROPOFOL N/A 01/02/2022   Procedure: COLONOSCOPY WITH PROPOFOL;  Surgeon: Dolores Frame, MD;  [facility];  Service: Gastroenterology;  Laterality: N/A;  1130   cyst removed from right breast     LAPAROSCOPIC SALPINGO OOPHERECTOMY Right 04/09/2017   Procedure: LAPAROSCOPIC RIGHT SALPINGO OOPHORECTOMY; RIGHT OVARIAN BENIGN CYSTIC TERATOMA;  Surgeon: Lazaro Arms, MD;  Location: [facility];  Service: Gynecology;  Laterality: Right;   PARTIAL HYSTERECTOMY     TUBAL LIGATION     Patient Active Problem List   Diagnosis Date Noted   Pre-op evaluation 07/06/2023   Abnormal EKG 07/06/2023   Intermittent palpitations 07/06/2023   Syncope 07/06/2023   Sciatica of right side 02/27/2023   Osteoarthritis of hips, bilateral 09/24/2022   Cervicalgia 09/25/2020   Seasonal allergies 02/18/2020   Depression, major, single episode, in partial remission (HCC) 10/29/2019   Essential hypertension 01/01/2018   Prediabetes 03/30/2015    Annual physical exam 04/08/2014   Right hip pain 04/06/2014   Hyperlipemia 06/22/2008   GERD (gastroesophageal reflux disease) 05/24/2008   Allergic rhinitis 01/08/2008   OVERACTIVE BLADDER 04/10/2007   GAD (generalized anxiety disorder) 08/22/2006   OSTEOARTHRITIS 08/22/2006    REFERRING PROVIDER:  Huel Cote, MD  REFERRING DIAG: [phone] (ICD-10-CM) - Tear of right acetabular labrum, initial encounter  THERAPY DIAG:  Pain in right hip  Stiffness of right hip, not elsewhere classified  Muscle weakness (generalized)  Rationale for Evaluation and Treatment: Rehabilitation  ONSET DATE: November/December 2023  SUBJECTIVE:   SUBJECTIVE STATEMENT: Patient states she is ready for surgery because it has not been getting better. Her hip is limiting her mental and physical being. She is used to being active and it is limiting her. Been having a lot of tests done on her heart. HEP going well. Been going up and down steps, vacuuming. Patient states 40% improved since beginning PT. She remains limited by pain.   PERTINENT HISTORY: Anxiety Back pain, lumbar spondylosis OA  PAIN:  Are you having pain? Yes NPRS:  3/10 current, >10/10 worst, 4/10 best Worst pain is with sudden turning Location:  R sided groin and distal anterior thigh  PRECAUTIONS: Other: per dx  WEIGHT BEARING RESTRICTIONS: No  FALLS:  Has patient fallen in last 6 months? Yes. Number of falls 1 fall when she  fainted  LIVING ENVIRONMENT: Lives with: lives with boyfriend and son Lives in: 1 story home Stairs: 4 steps to enter home with bilat rails, 8 steps on deck with bilat rails Has following equipment at home: None   PLOF: Independent  PATIENT GOALS: decrease pain, to be able to turn without pain, improved sleeping   OBJECTIVE:   DIAGNOSTIC FINDINGS:  (Per Epic) R hip MRI: IMPRESSION: 1. Mild degenerative changes at both hips with a small right hip joint effusion. No acute osseous  findings. 2. Mild edema within the right hip adductor muscles, likely muscular strain. 3. 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 4. Lower lumbar spondylosis.  R hip x ray: IMPRESSION: Mild bilateral femoroacetabular osteoarthritis.   (Per MD note) R hip X ray: Very mild degenerative findings with some ossification of the labrum   R hip MRI: There does appear to be a degenerative anterior superior labral tear with 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 PATIENT SURVEYS:  FOTO 58 with a goal of 66 at visit 12   COGNITION: Overall cognitive status: Within functional limits for tasks assessed                           LOWER EXTREMITY ROM:   Active ROM Right eval Left eval Right 07/08/23 Left 07/08/23  Hip flexion 84 102 104 105  Hip extension        Hip abduction Pt unable to perform in supine 28 32 33  Hip adduction        Hip internal rotation        Hip external rotation 18 26    Knee flexion        Knee extension        Ankle dorsiflexion        Ankle plantarflexion        Ankle inversion        Ankle eversion         (Blank rows = not tested)   LOWER EXTREMITY MMT:   MMT Right eval Left eval Right 07/08/23 Left 07/08/23  Hip flexion 4/5 5/5 4+* 5  Hip extension Tolerated min resistance with min pain 4/5    Hip abduction 15.7 21.5 ; 5/5 36.9* 38.8  Hip adduction        Hip internal rotation      5  Hip external rotation 4+/5 w/n available ROM; painful 5/5 w/n available ROM 4* 5  Knee flexion 4/5 5/5 4* 5  Knee extension 4+/5 5/5 4* 5  Ankle dorsiflexion        Ankle plantarflexion        Ankle inversion        Ankle eversion         (Blank rows = not tested)     FUNCTIONAL TESTS:  5x STS test:  22.45 07/08/23: 5xSTS 23.69 seconds without UE use, relies LLE>RLE, RLE dynamic knee valgus   GAIT: Comments: good heel to toe gait, minimally decreased gait speed, decreased BOS  TODAY'S  TREATMENT:  07/08/23 Reassessment Bridge with GTB at knees 3 x 10 Supine clam with GTB 3x10 Standing hip abduction 2 x 10 bilateral  06/11/23 Pt performed:  Therapeutic Exercise: Supine bridge x 10 reps and 2x10 with GTB around knees  Supine clam with RTB 3x10  LAQ 3# 2x10  Seated HS curl with RTB x10,x 7 reps  Sidestepping x 2 laps without bilat UE support at rail  Step ups on 4 inch step with rail x10    Neuro Re-ed Activities:  Standing with NBOS on airex x45 sec with UE support on rail toward the end   marching on airex with bilat UE support 2x10  Standing in staggered stance 2x25-30 sec with occasional/frequent UE support on rail with L/R LE back with SBA      PATIENT EDUCATION:  Education details:  HEP, POC, reassessment findings Person educated: Patient Education method: Explanation, demonstration, verbal cuing Education comprehension: verbalized understanding, returned demonstration, verbal cuing required  HOME EXERCISE PROGRAM: Pt has a land based HEP.   ASSESSMENT:  CLINICAL IMPRESSION: Patient has met 3/4 short term goals and 0/5 long term goals with ability to complete HEP and improvement in symptoms, strength, ROM, activity tolerance, and sleep. Remaining goals not met due to continued deficits in symptoms, strength, activity tolerance,  gait, balance, and functional mobility. Patient has made good progress toward remaining goals. Extending POC 2x/week for 6 weeks as patient with continued deficits and currently waiting for clearance for surgery. Continued with glute strengthening which is tolerated well. Patient will continue to benefit from skilled physical therapy in order to improve function and reduce impairment.    OBJECTIVE IMPAIRMENTS: decreased activity tolerance, decreased mobility, decreased ROM, decreased strength, hypomobility,  impaired flexibility, and pain.   ACTIVITY LIMITATIONS: lifting, sleeping, and turning  PARTICIPATION LIMITATIONS: meal prep and cleaning  PERSONAL FACTORS: 1 comorbidity: back pain  are also affecting patient's functional outcome.   REHAB POTENTIAL: Good  CLINICAL DECISION MAKING: Stable/uncomplicated  EVALUATION COMPLEXITY: Low   GOALS:  SHORT TERM GOALS: Target date: 7/23/204  Pt will tolerate aquatic therapy without adverse effects for improved pain, strength, function, and tolerance to activity.  Baseline: Goal status: INITIAL  2.  Pt will report at least a 25% improvement in pain and sx's overall.  Baseline:  Goal status: MET  3.  Pt will report reduced pain at night and improved sleeping.  Baseline:  Goal status: MET Target date:  05/27/2023  4.  Pt will demo improved R hip AROM to at least 10-15 deg in abd for improved mobility.   Goal status:  MET Target date:  05/27/2023    LONG TERM GOALS: Target date: 06/24/2023  Pt will report she is able to perform her normal community ambulation with good stability without her leg wanting to give way.  Baseline:  Goal status: INITIAL  2.  Pt will perform 5x STS test in < 15 sec for improved functional LE strength.  Baseline:  Goal status: INITIAL  3.  Pt will report at least a 70% reduction in pain with turning/pivoting with daily activities.  Baseline:  Goal status: INITIAL  4.  Pt will demo 5/5 strength in R hip flexion and knee ext and 20# with HHD testing for hip abduction for improved tolerance to activity and performance of ADLs/IADLs.  Baseline:  Goal status: INITIAL  5.  Pt will be independent with HEP for improved pain, strength, ROM, and function.  Baseline:  Goal status:  INITIAL    PLAN:  PT FREQUENCY:  2 times per week  PT DURATION: 6 weeks  PLANNED INTERVENTIONS: Therapeutic exercises, Therapeutic activity, Neuromuscular re-education, Balance training, Gait training, Patient/Family  education, Self Care, Joint mobilization, Stair training, Aquatic Therapy, Dry Needling, Electrical stimulation, Spinal mobilization, Cryotherapy, Moist heat, Taping, Ultrasound, Manual therapy, and Re-evaluation  PLAN FOR NEXT SESSION: Cont with land and aquatic therapy.    Reola Mosher Dontaye Hur, PT 07/08/2023, 9:23 AM

## 2023-07-09 ENCOUNTER — Telehealth: Payer: Self-pay

## 2023-07-09 DIAGNOSIS — Z8249 Family history of ischemic heart disease and other diseases of the circulatory system: Secondary | ICD-10-CM | POA: Diagnosis not present

## 2023-07-09 NOTE — Telephone Encounter (Signed)
Transition Care Management Follow-up Telephone Call Date of discharge and from where: 06/26/2023 [facility] How have you been since you were released from the hospital? Patient stated she is feeling much better. Any questions or concerns? No  Items Reviewed: Did the pt receive and understand the discharge instructions provided? Yes  Medications obtained and verified?  No medication prescribed. Other? No  Any new allergies since your discharge? No  Dietary orders reviewed? Yes Do you have support at home? Yes   Follow up appointments reviewed:  PCP Hospital f/u appt confirmed? No  Scheduled to see  on  @ . Specialist Hospital f/u appt confirmed? Yes  Scheduled to see Marjo Bicker, MD on 07/07/2023 @ [facility] [place]. Are transportation arrangements needed? No  If their condition worsens, is the pt aware to call PCP or go to the Emergency Dept.? Yes Was the patient provided with contact information for the PCP's office or ED? Yes Was to pt encouraged to call back with questions or concerns? Yes  Jaylee Lantry [facility]  [facility], [facility] Guide Direct Dial: [phone]  Website: [email or web address]

## 2023-07-10 LAB — LIPOPROTEIN A (LPA): Lipoprotein (a): 59.6 nmol/L (ref <75.0)

## 2023-07-11 ENCOUNTER — Other Ambulatory Visit: Payer: Self-pay

## 2023-07-11 ENCOUNTER — Telehealth: Payer: Self-pay | Admitting: Family Medicine

## 2023-07-11 MED ORDER — GABAPENTIN 300 MG PO CAPS: ORAL_CAPSULE | ORAL | 0 refills | Status: DC

## 2023-07-11 NOTE — Telephone Encounter (Signed)
Med refilled.

## 2023-07-11 NOTE — Telephone Encounter (Signed)
Prescription Request  07/11/2023  LOV: 06/25/2023  What is the name of the medication or equipment? gabapentin (NEURONTIN) 300 MG capsule   Have you contacted your pharmacy to request a refill? Yes   Which pharmacy would you like this sent to?  [facility]   Patient notified that their request is being sent to the clinical staff for review and that they should receive a response within 2 business days.   Please advise at  walk in office

## 2023-07-11 NOTE — Telephone Encounter (Signed)
error 

## 2023-07-16 ENCOUNTER — Ambulatory Visit: Payer: Medicare Other | Admitting: Family Medicine

## 2023-07-18 DIAGNOSIS — Z8249 Family history of ischemic heart disease and other diseases of the circulatory system: Secondary | ICD-10-CM | POA: Insufficient documentation

## 2023-07-19 ENCOUNTER — Other Ambulatory Visit: Payer: Self-pay | Admitting: Family Medicine

## 2023-07-19 DIAGNOSIS — F4323 Adjustment disorder with mixed anxiety and depressed mood: Secondary | ICD-10-CM

## 2023-07-21 ENCOUNTER — Telehealth: Payer: Self-pay | Admitting: Family Medicine

## 2023-07-21 ENCOUNTER — Other Ambulatory Visit: Payer: Self-pay

## 2023-07-21 MED ORDER — AMLODIPINE BESYLATE 5 MG PO TABS: 5.0000 mg | ORAL_TABLET | Freq: Every day | ORAL | 0 refills | Status: DC

## 2023-07-21 NOTE — Telephone Encounter (Signed)
Refill sent.

## 2023-07-21 NOTE — Telephone Encounter (Signed)
Prescription Request  07/21/2023  LOV: 06/25/2023  What is the name of the medication or equipment? amLODipine (NORVASC) 5 MG tablet [[id number]]    Have you contacted your pharmacy to request a refill? Yes   Which pharmacy would you like this sent to?   [facility] - [place], [place] - [address] [place] [address] Phone: [phone] Fax: [phone]     Patient notified that their request is being sent to the clinical staff for review and that they should receive a response within 2 business days.   Please advise at [facility] [phone]

## 2023-07-24 ENCOUNTER — Encounter (HOSPITAL_BASED_OUTPATIENT_CLINIC_OR_DEPARTMENT_OTHER): Payer: Self-pay | Admitting: Physical Therapy

## 2023-07-24 ENCOUNTER — Ambulatory Visit (HOSPITAL_BASED_OUTPATIENT_CLINIC_OR_DEPARTMENT_OTHER): Payer: Medicare Other | Admitting: Physical Therapy

## 2023-07-24 ENCOUNTER — Encounter (HOSPITAL_BASED_OUTPATIENT_CLINIC_OR_DEPARTMENT_OTHER): Payer: Self-pay

## 2023-07-24 DIAGNOSIS — M25551 Pain in right hip: Secondary | ICD-10-CM

## 2023-07-24 DIAGNOSIS — M6281 Muscle weakness (generalized): Secondary | ICD-10-CM

## 2023-07-24 DIAGNOSIS — M25651 Stiffness of right hip, not elsewhere classified: Secondary | ICD-10-CM

## 2023-07-24 NOTE — Therapy (Signed)
Pt arrived not prepared for aquatics.  She is aware of next scheduled appt 07/25/23 @ 7:15a

## 2023-07-25 ENCOUNTER — Ambulatory Visit (HOSPITAL_BASED_OUTPATIENT_CLINIC_OR_DEPARTMENT_OTHER): Payer: Medicare Other | Admitting: Physical Therapy

## 2023-07-25 ENCOUNTER — Encounter (HOSPITAL_BASED_OUTPATIENT_CLINIC_OR_DEPARTMENT_OTHER): Payer: Self-pay | Admitting: Physical Therapy

## 2023-07-25 DIAGNOSIS — R55 Syncope and collapse: Secondary | ICD-10-CM | POA: Diagnosis not present

## 2023-07-25 DIAGNOSIS — M25651 Stiffness of right hip, not elsewhere classified: Secondary | ICD-10-CM

## 2023-07-25 DIAGNOSIS — M6281 Muscle weakness (generalized): Secondary | ICD-10-CM

## 2023-07-25 DIAGNOSIS — M25551 Pain in right hip: Secondary | ICD-10-CM | POA: Diagnosis not present

## 2023-07-25 DIAGNOSIS — Z8249 Family history of ischemic heart disease and other diseases of the circulatory system: Secondary | ICD-10-CM | POA: Diagnosis not present

## 2023-07-25 NOTE — Therapy (Signed)
OUTPATIENT PHYSICAL THERAPY TREATMENT NOTE    Patient Name: [name] MRN: [id number] DOB:June 11, 1951, 72 y.o., female Today's Date: 07/25/2023   END OF SESSION:  PT End of Session - 07/25/23 0718     Visit Number 9    Number of Visits 22    Date for PT Re-Evaluation 08/19/23    Authorization Type BCBS MCR    PT Start Time 0717    PT Stop Time 0757    PT Time Calculation (min) 40 min    Activity Tolerance Patient tolerated treatment well    Behavior During Therapy [facility] for tasks assessed/performed                Past Medical History:  Diagnosis Date   Allergy    Anxiety    Back pain with radiation 07/13/2012   GERD (gastroesophageal reflux disease)    Glaucoma    Hyperlipidemia    Osteoarthritis    Past Surgical History:  Procedure Laterality Date   BREAST CYST EXCISION Right    COLONOSCOPY WITH PROPOFOL N/A 01/02/2022   Procedure: COLONOSCOPY WITH PROPOFOL;  Surgeon: Dolores Frame, MD;  [facility];  Service: Gastroenterology;  Laterality: N/A;  1130   cyst removed from right breast     LAPAROSCOPIC SALPINGO OOPHERECTOMY Right 04/09/2017   Procedure: LAPAROSCOPIC RIGHT SALPINGO OOPHORECTOMY; RIGHT OVARIAN BENIGN CYSTIC TERATOMA;  Surgeon: Lazaro Arms, MD;  Location: [facility];  Service: Gynecology;  Laterality: Right;   PARTIAL HYSTERECTOMY     TUBAL LIGATION     Patient Active Problem List   Diagnosis Date Noted   Family history of coronary artery disease 07/18/2023   Pre-op evaluation 07/06/2023   Nonspecific abnormal electrocardiogram (ECG) (EKG) 07/06/2023   Intermittent palpitations 07/06/2023   Syncope and collapse 07/06/2023   Sciatica of right side 02/27/2023   Osteoarthritis of hips, bilateral 09/24/2022   Cervicalgia 09/25/2020   Seasonal allergies 02/18/2020   Depression, major, single episode, in partial remission (HCC) 10/29/2019   Essential hypertension 01/01/2018   Prediabetes 03/30/2015   Preop cardiovascular  exam 04/08/2014   Right hip pain 04/06/2014   Hyperlipemia 06/22/2008   GERD (gastroesophageal reflux disease) 05/24/2008   Allergic rhinitis 01/08/2008   OVERACTIVE BLADDER 04/10/2007   GAD (generalized anxiety disorder) 08/22/2006   OSTEOARTHRITIS 08/22/2006    REFERRING PROVIDER:  Huel Cote, MD  REFERRING DIAG: [phone] (ICD-10-CM) - Tear of right acetabular labrum, initial encounter  THERAPY DIAG:  Pain in right hip  Stiffness of right hip, not elsewhere classified  Muscle weakness (generalized)  Rationale for Evaluation and Treatment: Rehabilitation  ONSET DATE: November/December 2023  SUBJECTIVE:   SUBJECTIVE STATEMENT: Patient states she has been walking a little more, not far but it did pretty good. Trying to gradually increase.   PERTINENT HISTORY: Anxiety Back pain, lumbar spondylosis OA  PAIN:  Are you having pain? Yes NPRS:  5/10 current, >10/10 worst, 4/10 best Worst pain is with sudden turning Location:  R sided groin and distal anterior thigh  PRECAUTIONS: Other: per dx  WEIGHT BEARING RESTRICTIONS: No  FALLS:  Has patient fallen in last 6 months? Yes. Number of falls 1 fall when she fainted  LIVING ENVIRONMENT: Lives with: lives with boyfriend and son Lives in: 1 story home Stairs: 4 steps to enter home with bilat rails, 8 steps on deck with bilat rails Has following equipment at home: None   PLOF: Independent  PATIENT GOALS: decrease pain, to be able to turn without pain,  improved sleeping   OBJECTIVE:   DIAGNOSTIC FINDINGS:  (Per Epic) R hip MRI: IMPRESSION: 1. Mild degenerative changes at both hips with a small right hip joint effusion. No acute osseous findings. 2. Mild edema within the right hip adductor muscles, likely muscular strain. 3. 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 4. Lower lumbar spondylosis.  R hip x ray: IMPRESSION: Mild bilateral femoroacetabular  osteoarthritis.   (Per MD note) R hip X ray: Very mild degenerative findings with some ossification of the labrum   R hip MRI: There does appear to be a degenerative anterior superior labral tear with 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 PATIENT SURVEYS:  FOTO 58 with a goal of 66 at visit 12   COGNITION: Overall cognitive status: Within functional limits for tasks assessed                           LOWER EXTREMITY ROM:   Active ROM Right eval Left eval Right 07/08/23 Left 07/08/23  Hip flexion 84 102 104 105  Hip extension        Hip abduction Pt unable to perform in supine 28 32 33  Hip adduction        Hip internal rotation        Hip external rotation 18 26    Knee flexion        Knee extension        Ankle dorsiflexion        Ankle plantarflexion        Ankle inversion        Ankle eversion         (Blank rows = not tested)   LOWER EXTREMITY MMT:   MMT Right eval Left eval Right 07/08/23 Left 07/08/23  Hip flexion 4/5 5/5 4+* 5  Hip extension Tolerated min resistance with min pain 4/5    Hip abduction 15.7 21.5 ; 5/5 36.9* 38.8  Hip adduction        Hip internal rotation      5  Hip external rotation 4+/5 w/n available ROM; painful 5/5 w/n available ROM 4* 5  Knee flexion 4/5 5/5 4* 5  Knee extension 4+/5 5/5 4* 5  Ankle dorsiflexion        Ankle plantarflexion        Ankle inversion        Ankle eversion         (Blank rows = not tested)     FUNCTIONAL TESTS:  5x STS test:  22.45 07/08/23: 5xSTS 23.69 seconds without UE use, relies LLE>RLE, RLE dynamic knee valgus   GAIT: Comments: good heel to toe gait, minimally decreased gait speed, decreased BOS  TODAY'S TREATMENT:  07/25/23 Bridge with GTB at knees 3 x 10 Supine clam with GTB 3x10 Supine hooklying march GTB at knees 1 x 10 Hooklying  bent knee fall outs 2 x 10 bilateral  STS from elevated table 2 x 10 Standing hip abduction 3 x 10 bilateral Step up 4 inch 2 x 10 bilateral  07/08/23 Reassessment Bridge with GTB at knees 3 x 10 Supine clam with GTB 3x10 Standing hip abduction 2 x 10 bilateral  06/11/23 Pt performed:  Therapeutic Exercise: Supine bridge x 10 reps and 2x10 with GTB around knees  Supine clam with RTB 3x10  LAQ 3# 2x10  Seated HS curl with RTB x10,x 7 reps  Sidestepping x 2 laps without bilat UE support at rail  Step ups on 4 inch step with rail x10    Neuro Re-ed Activities:  Standing with NBOS on airex x45 sec with UE support on rail toward the end   marching on airex with bilat UE support 2x10  Standing in staggered stance 2x25-30 sec with occasional/frequent UE support on rail with L/R LE back with SBA      PATIENT EDUCATION:  Education details: 07/08/23 HEP, POC, reassessment findings 07/25/23: HEP Person educated: Patient Education method: Explanation, demonstration, verbal cuing Education comprehension: verbalized understanding, returned demonstration, verbal cuing required  HOME EXERCISE PROGRAM: Pt has a land based HEP.  Access Code: [email or web address] URL: [email or web address]  Date: 07/25/2023 - Sit to Stand with Arms Crossed  - 1 x daily - 7 x weekly - 2 sets - 10 reps - Standing Hip Abduction with Counter Support  - 1 x daily - 7 x weekly - 3 sets - 10 reps  ASSESSMENT:  CLINICAL IMPRESSION: Began session with previously completed exercises with minimal cueing for mechanics and positioning. Continued with hip strengthening and mobility which are tolerated well with short rest breaks. Patient will continue to benefit from skilled physical therapy in order to improve function and reduce impairment.    OBJECTIVE IMPAIRMENTS: decreased activity tolerance, decreased mobility, decreased ROM, decreased strength, hypomobility, impaired flexibility, and pain.   ACTIVITY  LIMITATIONS: lifting, sleeping, and turning  PARTICIPATION LIMITATIONS: meal prep and cleaning  PERSONAL FACTORS: 1 comorbidity: back pain  are also affecting patient's functional outcome.   REHAB POTENTIAL: Good  CLINICAL DECISION MAKING: Stable/uncomplicated  EVALUATION COMPLEXITY: Low   GOALS:  SHORT TERM GOALS: Target date: 7/23/204  Pt will tolerate aquatic therapy without adverse effects for improved pain, strength, function, and tolerance to activity.  Baseline: Goal status: INITIAL  2.  Pt will report at least a 25% improvement in pain and sx's overall.  Baseline:  Goal status: MET  3.  Pt will report reduced pain at night and improved sleeping.  Baseline:  Goal status: MET Target date:  05/27/2023  4.  Pt will demo improved R hip AROM to at least 10-15 deg in abd for improved mobility.   Goal status:  MET Target date:  05/27/2023    LONG TERM GOALS: Target date: 06/24/2023  Pt will report she is able to perform her normal community ambulation with good stability without her leg wanting to give way.  Baseline:  Goal status: INITIAL  2.  Pt will perform 5x STS test in < 15 sec for improved functional LE strength.  Baseline:  Goal status: INITIAL  3.  Pt will report at least a 70% reduction in pain with turning/pivoting with daily activities.  Baseline:  Goal status: INITIAL  4.  Pt will demo 5/5 strength  in R hip flexion and knee ext and 20# with HHD testing for hip abduction for improved tolerance to activity and performance of ADLs/IADLs.  Baseline:  Goal status: INITIAL  5.  Pt will be independent with HEP for improved pain, strength, ROM, and function.  Baseline:  Goal status:  INITIAL    PLAN:  PT FREQUENCY:  2 times per week  PT DURATION: 6 weeks  PLANNED INTERVENTIONS: Therapeutic exercises, Therapeutic activity, Neuromuscular re-education, Balance training, Gait training, Patient/Family education, Self Care, Joint mobilization, Stair  training, Aquatic Therapy, Dry Needling, Electrical stimulation, Spinal mobilization, Cryotherapy, Moist heat, Taping, Ultrasound, Manual therapy, and Re-evaluation  PLAN FOR NEXT SESSION: Cont with land and aquatic therapy.    Reola Mosher Lev Cervone, PT 07/25/2023, 7:59 AM

## 2023-07-29 ENCOUNTER — Ambulatory Visit (HOSPITAL_BASED_OUTPATIENT_CLINIC_OR_DEPARTMENT_OTHER): Payer: Medicare Other | Attending: Orthopaedic Surgery | Admitting: Physical Therapy

## 2023-07-29 ENCOUNTER — Telehealth: Payer: Self-pay | Admitting: Internal Medicine

## 2023-07-29 ENCOUNTER — Encounter (HOSPITAL_BASED_OUTPATIENT_CLINIC_OR_DEPARTMENT_OTHER): Payer: Self-pay | Admitting: Physical Therapy

## 2023-07-29 DIAGNOSIS — M25551 Pain in right hip: Secondary | ICD-10-CM | POA: Diagnosis not present

## 2023-07-29 DIAGNOSIS — M6281 Muscle weakness (generalized): Secondary | ICD-10-CM | POA: Insufficient documentation

## 2023-07-29 DIAGNOSIS — M25651 Stiffness of right hip, not elsewhere classified: Secondary | ICD-10-CM | POA: Insufficient documentation

## 2023-07-29 NOTE — Telephone Encounter (Signed)
   Pre-operative Risk Assessment    Patient Name: [name]  DOB: April 19, 1951 MRN: [id number]      Request for Surgical Clearance    Procedure:   Right Hip Arthroscopy w/Labrol repair  Date of Surgery:  Clearance TBD                                 Surgeon:  Huel Cote, MD Surgeon's Group or Practice Name:  [facility] number:  [phone] Fax number:  [phone] attn April   Type of Clearance Requested:   - Medical  - Pharmacy:  Hold if applicable      Type of Anesthesia:   General w/block   Additional requests/questions:    SignedSeymour Bars   07/29/2023, 10:42 AM

## 2023-07-29 NOTE — Therapy (Signed)
OUTPATIENT PHYSICAL THERAPY TREATMENT NOTE    Patient Name: [name] MRN: [id number] DOB:1951-06-14, 72 y.o., female Today's Date: 07/29/2023   END OF SESSION:  PT End of Session - 07/29/23 0954     Visit Number 10    Number of Visits 22    Date for PT Re-Evaluation 08/19/23    Authorization Type BCBS MCR    PT Start Time 4098    PT Stop Time 1032    PT Time Calculation (min) 38 min    Activity Tolerance Patient tolerated treatment well    Behavior During Therapy [facility] for tasks assessed/performed                Past Medical History:  Diagnosis Date   Allergy    Anxiety    Back pain with radiation 07/13/2012   GERD (gastroesophageal reflux disease)    Glaucoma    Hyperlipidemia    Osteoarthritis    Past Surgical History:  Procedure Laterality Date   BREAST CYST EXCISION Right    COLONOSCOPY WITH PROPOFOL N/A 01/02/2022   Procedure: COLONOSCOPY WITH PROPOFOL;  Surgeon: Dolores Frame, MD;  [facility]: [facility];  Service: Gastroenterology;  Laterality: N/A;  1130   cyst removed from right breast     LAPAROSCOPIC SALPINGO OOPHERECTOMY Right 04/09/2017   Procedure: LAPAROSCOPIC RIGHT SALPINGO OOPHORECTOMY; RIGHT OVARIAN BENIGN CYSTIC TERATOMA;  Surgeon: Lazaro Arms, MD;  Location: [facility];  Service: Gynecology;  Laterality: Right;   PARTIAL HYSTERECTOMY     TUBAL LIGATION     Patient Active Problem List   Diagnosis Date Noted   Family history of coronary artery disease 07/18/2023   Pre-op evaluation 07/06/2023   Nonspecific abnormal electrocardiogram (ECG) (EKG) 07/06/2023   Intermittent palpitations 07/06/2023   Syncope and collapse 07/06/2023   Sciatica of right side 02/27/2023   Osteoarthritis of hips, bilateral 09/24/2022   Cervicalgia 09/25/2020   Seasonal allergies 02/18/2020   Depression, major, single episode, in partial remission (HCC) 10/29/2019   Essential hypertension 01/01/2018   Prediabetes 03/30/2015   Preop cardiovascular  exam 04/08/2014   Right hip pain 04/06/2014   Hyperlipemia 06/22/2008   GERD (gastroesophageal reflux disease) 05/24/2008   Allergic rhinitis 01/08/2008   OVERACTIVE BLADDER 04/10/2007   GAD (generalized anxiety disorder) 08/22/2006   OSTEOARTHRITIS 08/22/2006    REFERRING PROVIDER:  Huel Cote, MD  REFERRING DIAG: [phone] (ICD-10-CM) - Tear of right acetabular labrum, initial encounter  THERAPY DIAG:  Pain in right hip  Stiffness of right hip, not elsewhere classified  Muscle weakness (generalized)  Rationale for Evaluation and Treatment: Rehabilitation  ONSET DATE: November/December 2023  SUBJECTIVE:   SUBJECTIVE STATEMENT: Patient states she is sore today in her right leg in her hip and thigh. Felt good after last session.  PERTINENT HISTORY: Anxiety Back pain, lumbar spondylosis OA  PAIN:  Are you having pain? Yes NPRS:  5/10 current, >10/10 worst, 4/10 best Worst pain is with sudden turning Location:  R sided groin and distal anterior thigh  PRECAUTIONS: Other: per dx  WEIGHT BEARING RESTRICTIONS: No  FALLS:  Has patient fallen in last 6 months? Yes. Number of falls 1 fall when she fainted  LIVING ENVIRONMENT: Lives with: lives with boyfriend and son Lives in: 1 story home Stairs: 4 steps to enter home with bilat rails, 8 steps on deck with bilat rails Has following equipment at home: None   PLOF: Independent  PATIENT GOALS: decrease pain, to be able to turn without pain, improved  sleeping   OBJECTIVE:   DIAGNOSTIC FINDINGS:  (Per Epic) R hip MRI: IMPRESSION: 1. Mild degenerative changes at both hips with a small right hip joint effusion. No acute osseous findings. 2. Mild edema within the right hip adductor muscles, likely muscular strain. 3. 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 4. Lower lumbar spondylosis.  R hip x ray: IMPRESSION: Mild bilateral femoroacetabular osteoarthritis.   (Per MD  note) R hip X ray: Very mild degenerative findings with some ossification of the labrum   R hip MRI: There does appear to be a degenerative anterior superior labral tear with 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 PATIENT SURVEYS:  FOTO 58 with a goal of 66 at visit 12   COGNITION: Overall cognitive status: Within functional limits for tasks assessed                           LOWER EXTREMITY ROM:   Active ROM Right eval Left eval Right 07/08/23 Left 07/08/23  Hip flexion 84 102 104 105  Hip extension        Hip abduction Pt unable to perform in supine 28 32 33  Hip adduction        Hip internal rotation        Hip external rotation 18 26    Knee flexion        Knee extension        Ankle dorsiflexion        Ankle plantarflexion        Ankle inversion        Ankle eversion         (Blank rows = not tested)   LOWER EXTREMITY MMT:   MMT Right eval Left eval Right 07/08/23 Left 07/08/23  Hip flexion 4/5 5/5 4+* 5  Hip extension Tolerated min resistance with min pain 4/5    Hip abduction 15.7 21.5 ; 5/5 36.9* 38.8  Hip adduction        Hip internal rotation      5  Hip external rotation 4+/5 w/n available ROM; painful 5/5 w/n available ROM 4* 5  Knee flexion 4/5 5/5 4* 5  Knee extension 4+/5 5/5 4* 5  Ankle dorsiflexion        Ankle plantarflexion        Ankle inversion        Ankle eversion         (Blank rows = not tested)     FUNCTIONAL TESTS:  5x STS test:  22.45 07/08/23: 5xSTS 23.69 seconds without UE use, relies LLE>RLE, RLE dynamic knee valgus   GAIT: Comments: good heel to toe gait, minimally decreased gait speed, decreased BOS  TODAY'S TREATMENT:  07/29/23 Nustep level 3, seat 6, 5 minutes for dynamic warm up Prone quad stretch 5 x 30 second holds Prone hip extension 2 x 10 Sidelying hip  abduction 2 x 10 Seated march 2 x 10 STS from elevated table 3 x 10  07/25/23 Bridge with GTB at knees 3 x 10 Supine clam with GTB 3x10 Supine hooklying march GTB at knees 1 x 10 Hooklying bent knee fall outs 2 x 10 bilateral  STS from elevated table 2 x 10 Standing hip abduction 3 x 10 bilateral Step up 4 inch 2 x 10 bilateral  07/08/23 Reassessment Bridge with GTB at knees 3 x 10 Supine clam with GTB 3x10 Standing hip abduction 2 x 10 bilateral  06/11/23 Pt performed:  Therapeutic Exercise: Supine bridge x 10 reps and 2x10 with GTB around knees  Supine clam with RTB 3x10  LAQ 3# 2x10  Seated HS curl with RTB x10,x 7 reps  Sidestepping x 2 laps without bilat UE support at rail  Step ups on 4 inch step with rail x10    Neuro Re-ed Activities:  Standing with NBOS on airex x45 sec with UE support on rail toward the end   marching on airex with bilat UE support 2x10  Standing in staggered stance 2x25-30 sec with occasional/frequent UE support on rail with L/R LE back with SBA      PATIENT EDUCATION:  Education details: 07/08/23 HEP, POC, reassessment findings 07/25/23: HEP 07/29/23 HEP, hip anatomy Person educated: Patient Education method: Explanation, demonstration, verbal cuing Education comprehension: verbalized understanding, returned demonstration, verbal cuing required  HOME EXERCISE PROGRAM: Pt has a land based HEP.  Access Code: [email or web address] URL: [email or web address]  Date: 07/25/2023 - Sit to Stand with Arms Crossed  - 1 x daily - 7 x weekly - 2 sets - 10 reps - Standing Hip Abduction with Counter Support  - 1 x daily - 7 x weekly - 3 sets - 10 reps  07/29/23 - Prone Quadriceps Stretch with Strap  - 1 x daily - 7 x weekly - 5 reps - 20-30 second hold - Prone Hip Extension  - 1 x daily - 7 x weekly - 2 sets - 10 reps  ASSESSMENT:  CLINICAL IMPRESSION: Began session with nustep for dynamic warm up and conditioning. Patient with symptoms in R hip/quad  region which is targeted with prone quad stretch. Patient with improving gait mechanics and symptoms following. Patient stating table exercises improve symptoms.  Patient will continue to benefit from skilled physical therapy in order to improve function and reduce impairment.    OBJECTIVE IMPAIRMENTS: decreased activity tolerance, decreased mobility, decreased ROM, decreased strength, hypomobility, impaired flexibility, and pain.   ACTIVITY LIMITATIONS: lifting, sleeping, and turning  PARTICIPATION LIMITATIONS: meal prep and cleaning  PERSONAL FACTORS: 1 comorbidity: back pain  are also affecting patient's functional outcome.   REHAB POTENTIAL: Good  CLINICAL DECISION MAKING: Stable/uncomplicated  EVALUATION COMPLEXITY: Low   GOALS:  SHORT TERM GOALS: Target date: 7/23/204  Pt will tolerate aquatic therapy without adverse effects for improved pain, strength, function, and tolerance to activity.  Baseline: Goal status: INITIAL  2.  Pt will report at least a 25% improvement in pain and sx's overall.  Baseline:  Goal status: MET  3.  Pt will report reduced pain at night and improved sleeping.  Baseline:  Goal status: MET Target date:  05/27/2023  4.  Pt will demo improved R hip AROM to at least 10-15 deg in abd for improved mobility.  Goal status:  MET Target date:  05/27/2023    LONG TERM GOALS: Target date: 06/24/2023  Pt will report she is able to perform her normal community ambulation with good stability without her leg wanting to give way.  Baseline:  Goal status: INITIAL  2.  Pt will perform 5x STS test in < 15 sec for improved functional LE strength.  Baseline:  Goal status: INITIAL  3.  Pt will report at least a 70% reduction in pain with turning/pivoting with daily activities.  Baseline:  Goal status: INITIAL  4.  Pt will demo 5/5 strength in R hip flexion and knee ext and 20# with HHD testing for hip abduction for improved tolerance to activity and  performance of ADLs/IADLs.  Baseline:  Goal status: INITIAL  5.  Pt will be independent with HEP for improved pain, strength, ROM, and function.  Baseline:  Goal status:  INITIAL    PLAN:  PT FREQUENCY:  2 times per week  PT DURATION: 6 weeks  PLANNED INTERVENTIONS: Therapeutic exercises, Therapeutic activity, Neuromuscular re-education, Balance training, Gait training, Patient/Family education, Self Care, Joint mobilization, Stair training, Aquatic Therapy, Dry Needling, Electrical stimulation, Spinal mobilization, Cryotherapy, Moist heat, Taping, Ultrasound, Manual therapy, and Re-evaluation  PLAN FOR NEXT SESSION: Cont with land and aquatic therapy.    Reola Mosher Mehar Kirkwood, PT 07/29/2023, 9:55 AM

## 2023-07-31 ENCOUNTER — Ambulatory Visit (HOSPITAL_BASED_OUTPATIENT_CLINIC_OR_DEPARTMENT_OTHER): Payer: Medicare Other | Admitting: Physical Therapy

## 2023-07-31 ENCOUNTER — Encounter (HOSPITAL_BASED_OUTPATIENT_CLINIC_OR_DEPARTMENT_OTHER): Payer: Self-pay | Admitting: Physical Therapy

## 2023-07-31 DIAGNOSIS — M25551 Pain in right hip: Secondary | ICD-10-CM | POA: Diagnosis not present

## 2023-07-31 DIAGNOSIS — M6281 Muscle weakness (generalized): Secondary | ICD-10-CM

## 2023-07-31 DIAGNOSIS — M25651 Stiffness of right hip, not elsewhere classified: Secondary | ICD-10-CM

## 2023-07-31 NOTE — Therapy (Signed)
OUTPATIENT PHYSICAL THERAPY TREATMENT NOTE    Patient Name: [name] MRN: [id number] DOB:[birth year], 72 y.o., female Today's Date: 07/31/2023   END OF SESSION:  PT End of Session - 07/31/23 0917     Visit Number 11    Number of Visits 22    Date for PT Re-Evaluation 08/19/23    Authorization Type BCBS MCR    Progress Note Due on Visit 18    PT Start Time 0907   pt arrived late   PT Stop Time 0945    PT Time Calculation (min) 38 min    Activity Tolerance Patient tolerated treatment well    Behavior During Therapy [facility] for tasks assessed/performed                 Past Medical History:  Diagnosis Date   Allergy    Anxiety    Back pain with radiation 07/13/2012   GERD (gastroesophageal reflux disease)    Glaucoma    Hyperlipidemia    Osteoarthritis    Past Surgical History:  Procedure Laterality Date   BREAST CYST EXCISION Right    COLONOSCOPY WITH PROPOFOL N/A 01/02/2022   Procedure: COLONOSCOPY WITH PROPOFOL;  Surgeon: Dolores Frame, MD;  [facility]: [facility];  Service: Gastroenterology;  Laterality: N/A;  1130   cyst removed from right breast     LAPAROSCOPIC SALPINGO OOPHERECTOMY Right 04/09/2017   Procedure: LAPAROSCOPIC RIGHT SALPINGO OOPHORECTOMY; RIGHT OVARIAN BENIGN CYSTIC TERATOMA;  Surgeon: Lazaro Arms, MD;  Location: [facility];  Service: Gynecology;  Laterality: Right;   PARTIAL HYSTERECTOMY     TUBAL LIGATION     Patient Active Problem List   Diagnosis Date Noted   Family history of coronary artery disease 07/18/2023   Pre-op evaluation 07/06/2023   Nonspecific abnormal electrocardiogram (ECG) (EKG) 07/06/2023   Intermittent palpitations 07/06/2023   Syncope and collapse 07/06/2023   Sciatica of right side 02/27/2023   Osteoarthritis of hips, bilateral 09/24/2022   Cervicalgia 09/25/2020   Seasonal allergies 02/18/2020   Depression, major, single episode, in partial remission (HCC) 10/29/2019   Essential hypertension  01/01/2018   Prediabetes 03/30/2015   Preop cardiovascular exam 04/08/2014   Right hip pain 04/06/2014   Hyperlipemia 06/22/2008   GERD (gastroesophageal reflux disease) 05/24/2008   Allergic rhinitis 01/08/2008   OVERACTIVE BLADDER 04/10/2007   GAD (generalized anxiety disorder) 08/22/2006   OSTEOARTHRITIS 08/22/2006    REFERRING PROVIDER:  Huel Cote, MD  REFERRING DIAG: [phone] (ICD-10-CM) - Tear of right acetabular labrum, initial encounter  THERAPY DIAG:  Pain in right hip  Stiffness of right hip, not elsewhere classified  Muscle weakness (generalized)  Rationale for Evaluation and Treatment: Rehabilitation  ONSET DATE: November/December 2023  SUBJECTIVE:   SUBJECTIVE STATEMENT: "He worked me out last time, and it felt good".    PERTINENT HISTORY: Anxiety Back pain, lumbar spondylosis OA  PAIN:  Are you having pain? Yes NPRS:  4/10 current, Location:  R sided groin and distal anterior thigh Description: pinching Worst pain is with sudden turning   PRECAUTIONS: Other: per dx  WEIGHT BEARING RESTRICTIONS: No  FALLS:  Has patient fallen in last 6 months? Yes. Number of falls 1 fall when she fainted  LIVING ENVIRONMENT: Lives with: lives with boyfriend and son Lives in: 1 story home Stairs: 4 steps to enter home with bilat rails, 8 steps on deck with bilat rails Has following equipment at home: None   PLOF: Independent  PATIENT GOALS: decrease pain, to be able  to turn without pain, improved sleeping   OBJECTIVE:   DIAGNOSTIC FINDINGS:  (Per Epic) R hip MRI: IMPRESSION: 1. Mild degenerative changes at both hips with a small right hip joint effusion. No acute osseous findings. 2. Mild edema within the right hip adductor muscles, likely muscular strain. 3. 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 4. Lower lumbar spondylosis.  R hip x ray: IMPRESSION: Mild bilateral femoroacetabular  osteoarthritis.   (Per MD note) R hip X ray: Very mild degenerative findings with some ossification of the labrum   R hip MRI: There does appear to be a degenerative anterior superior labral tear with 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 PATIENT SURVEYS:  FOTO 58 with a goal of 66 at visit 12   COGNITION: Overall cognitive status: Within functional limits for tasks assessed                           LOWER EXTREMITY ROM:   Active ROM Right eval Left eval Right 07/08/23 Left 07/08/23  Hip flexion 84 102 104 105  Hip extension        Hip abduction Pt unable to perform in supine 28 32 33  Hip adduction        Hip internal rotation        Hip external rotation 18 26    Knee flexion        Knee extension        Ankle dorsiflexion        Ankle plantarflexion        Ankle inversion        Ankle eversion         (Blank rows = not tested)   LOWER EXTREMITY MMT:   MMT Right eval Left eval Right 07/08/23 Left 07/08/23  Hip flexion 4/5 5/5 4+* 5  Hip extension Tolerated min resistance with min pain 4/5    Hip abduction 15.7 21.5 ; 5/5 36.9* 38.8  Hip adduction        Hip internal rotation      5  Hip external rotation 4+/5 w/n available ROM; painful 5/5 w/n available ROM 4* 5  Knee flexion 4/5 5/5 4* 5  Knee extension 4+/5 5/5 4* 5  Ankle dorsiflexion        Ankle plantarflexion        Ankle inversion        Ankle eversion         (Blank rows = not tested)     FUNCTIONAL TESTS:  5x STS test:  22.45  07/08/23: 5xSTS 23.69 seconds without UE use, relies LLE>RLE, RLE dynamic knee valgus   GAIT: Comments: good heel to toe gait, minimally decreased gait speed, decreased BOS  TODAY'S TREATMENT:  10/3 Pt seen for aquatic therapy today.  Treatment took place in water 3.5-4.75 ft in depth at the [facility]  pool. Temp of water was 91.  Pt entered/exited the pool via stairs alternating step-to and step-through pattern with bilat hand rail.   *walking forward, backward without support x 4 laps *  side stepping without support x 1 lap, then with arm addct/ abdct with rainbow hand floats x 2 laps (cues for technique and breath) * UE on wall:  mini side to side lunges at 4 ft (limited tolerance);  straight LE hip circles clockwise, counter clockwise x 5 each LE, each direction; RLE hip ext x 15  * UE on yellow hand floats:  backwards walking with attention to Rt hip ext * Rt step ups with RUE On wall, x 8, with cues for forward trunk shift with glute activation  *STS from bench onto water step x 10, with 4 sec eccentric lowering and cues for hip hinge, forward arm reach and neutral head position * Rt hip flexor stretch with Lt foot on 2nd step x 20s     Pt requires the buoyancy and hydrostatic pressure of water for support, and to offload joints by unweighting joint load by at least 50 % in navel deep water and by at least 75-80% in chest to neck deep water.  Viscosity of the water is needed for resistance of strengthening. Water current perturbations provides challenge to standing balance requiring increased core activation.  07/29/23 Nustep level 3, seat 6, 5 minutes for dynamic warm up Prone quad stretch 5 x 30 second holds Prone hip extension 2 x 10 Sidelying hip abduction 2 x 10 Seated march 2 x 10 STS from elevated table 3 x 10  07/25/23 Bridge with GTB at knees 3 x 10 Supine clam with GTB 3x10 Supine hooklying march GTB at knees 1 x 10 Hooklying bent knee fall outs 2 x 10 bilateral  STS from elevated table 2 x 10 Standing hip abduction 3 x 10 bilateral Step up 4 inch 2 x 10 bilateral  07/08/23 Reassessment Bridge with GTB at knees 3 x 10 Supine clam with GTB 3x10 Standing hip abduction 2 x 10 bilateral  06/11/23 Pt performed:  Therapeutic Exercise: Supine bridge x 10 reps and 2x10  with GTB around knees  Supine clam with RTB 3x10  LAQ 3# 2x10  Seated HS curl with RTB x10,x 7 reps  Sidestepping x 2 laps without bilat UE support at rail  Step ups on 4 inch step with rail x10    Neuro Re-ed Activities:  Standing with NBOS on airex x45 sec with UE support on rail toward the end   marching on airex with bilat UE support 2x10  Standing in staggered stance 2x25-30 sec with occasional/frequent UE support on rail with L/R LE back with SBA      PATIENT EDUCATION:  Education details: 10/3 reacquainting to aquatic therapy; exercise modifications  Person educated: Patient Education method: Explanation, demonstration, verbal cuing Education comprehension: verbalized understanding, returned demonstration, verbal cuing required  HOME EXERCISE PROGRAM: Pt has a land based HEP.  Access Code: [email or web address] URL: [email or web address]  Date: 07/25/2023 - Sit to Stand with Arms Crossed  - 1 x daily - 7 x weekly - 2 sets - 10 reps - Standing Hip Abduction with Counter Support  - 1 x daily - 7 x weekly - 3 sets - 10 reps  07/29/23 - Prone Quadriceps Stretch with Strap  - 1 x daily -  7 x weekly - 5 reps - 20-30 second hold - Prone Hip Extension  - 1 x daily - 7 x weekly - 2 sets - 10 reps  ASSESSMENT:  CLINICAL IMPRESSION: Pt reports relief of Rt ant hip symptoms with hip extension in the water.  She had limited tolerance for wide stance for adductor stretch.  Improved tolerance for Rt step ups when leaning trunk forward and activating glute max. Will plan to create aquatic HEP in future session. Pt is progressing gradually towards LTGs.  Patient will continue to benefit from skilled physical therapy in order to improve function and reduce impairment.    OBJECTIVE IMPAIRMENTS: decreased activity tolerance, decreased mobility, decreased ROM, decreased strength, hypomobility, impaired flexibility, and pain.   ACTIVITY LIMITATIONS: lifting, sleeping, and  turning  PARTICIPATION LIMITATIONS: meal prep and cleaning  PERSONAL FACTORS: 1 comorbidity: back pain  are also affecting patient's functional outcome.   REHAB POTENTIAL: Good  CLINICAL DECISION MAKING: Stable/uncomplicated  EVALUATION COMPLEXITY: Low   GOALS:  SHORT TERM GOALS: Target date: 7/23/204  Pt will tolerate aquatic therapy without adverse effects for improved pain, strength, function, and tolerance to activity.  Baseline: Goal status: INITIAL  2.  Pt will report at least a 25% improvement in pain and sx's overall.  Baseline:  Goal status: MET  3.  Pt will report reduced pain at night and improved sleeping.  Baseline:  Goal status: MET Target date:  05/27/2023  4.  Pt will demo improved R hip AROM to at least 10-15 deg in abd for improved mobility.   Goal status:  MET Target date:  05/27/2023    LONG TERM GOALS: Target date:POC date  Pt will report she is able to perform her normal community ambulation with good stability without her leg wanting to give way.  Baseline:  Goal status: In progress   2.  Pt will perform 5x STS test in < 15 sec for improved functional LE strength.  Baseline:  Goal status: In progress - see chart above  3.  Pt will report at least a 70% reduction in pain with turning/pivoting with daily activities.  Baseline:  Goal status: In progress   4.  Pt will demo 5/5 strength in R hip flexion and knee ext and 20# with HHD testing for hip abduction for improved tolerance to activity and performance of ADLs/IADLs.  Baseline:  Goal status: In progress   5.  Pt will be independent with HEP for improved pain, strength, ROM, and function.  Baseline:  Goal status:  In progress     PLAN:  PT FREQUENCY:  2 times per week  PT DURATION: 6 weeks  PLANNED INTERVENTIONS: Therapeutic exercises, Therapeutic activity, Neuromuscular re-education, Balance training, Gait training, Patient/Family education, Self Care, Joint mobilization, Stair  training, Aquatic Therapy, Dry Needling, Electrical stimulation, Spinal mobilization, Cryotherapy, Moist heat, Taping, Ultrasound, Manual therapy, and Re-evaluation  PLAN FOR NEXT SESSION: Cont with land and aquatic therapy.  Mayer Camel, PTA 07/31/23 10:13 AM [facility] [address] [place], [place], [address] Phone: [phone]   Fax:  [phone]

## 2023-08-01 ENCOUNTER — Ambulatory Visit (HOSPITAL_COMMUNITY)
Admission: RE | Admit: 2023-08-01 | Discharge: 2023-08-01 | Disposition: A | Discharge status: Home / Self Care | Payer: Medicare Other | Source: Ambulatory Visit | Attending: Internal Medicine | Admitting: Internal Medicine

## 2023-08-01 DIAGNOSIS — R55 Syncope and collapse: Secondary | ICD-10-CM

## 2023-08-01 DIAGNOSIS — Z8249 Family history of ischemic heart disease and other diseases of the circulatory system: Secondary | ICD-10-CM | POA: Insufficient documentation

## 2023-08-01 LAB — ECHOCARDIOGRAM COMPLETE
Area-P 1/2: 3.72 cm2
S' Lateral: 2.9 cm

## 2023-08-01 NOTE — Telephone Encounter (Signed)
   Patient Name: [name]  DOB: 1950-11-21 MRN: [id number]  Primary Cardiologist: Marjo Bicker, MD  Chart reviewed as part of pre-operative protocol coverage. Given past medical history and time since last visit, based on ACC/AHA guidelines, [name] is at acceptable risk for the planned procedure without further cardiovascular testing.   Echocardiogram 10/31/2022 per Dr. Jenene Slicker  "Normal pumping function of the heart, normal diastology and no valvular heart disease.  Patient is low risk for any perioperative cardiac complications for hip arthroscopy procedure"   The patient was advised that if she develops new symptoms prior to surgery to contact our office to arrange for a follow-up visit, and she verbalized understanding.  I will route this recommendation to the requesting party via Epic fax function and remove from pre-op pool.  Please call with questions.  Joni Reining, NP 08/01/2023, 11:52 AM

## 2023-08-04 ENCOUNTER — Encounter (HOSPITAL_BASED_OUTPATIENT_CLINIC_OR_DEPARTMENT_OTHER): Payer: Self-pay

## 2023-08-04 ENCOUNTER — Ambulatory Visit (HOSPITAL_BASED_OUTPATIENT_CLINIC_OR_DEPARTMENT_OTHER): Payer: Medicare Other

## 2023-08-04 DIAGNOSIS — M25551 Pain in right hip: Secondary | ICD-10-CM | POA: Diagnosis not present

## 2023-08-04 DIAGNOSIS — M25651 Stiffness of right hip, not elsewhere classified: Secondary | ICD-10-CM | POA: Diagnosis not present

## 2023-08-04 DIAGNOSIS — M6281 Muscle weakness (generalized): Secondary | ICD-10-CM

## 2023-08-04 NOTE — Therapy (Signed)
OUTPATIENT PHYSICAL THERAPY TREATMENT NOTE    Patient Name: [name] MRN: [id number] DOB:[birth year], 72 y.o., female Today's Date: 08/04/2023   END OF SESSION:  PT End of Session - 08/04/23 1308     Visit Number 12    Number of Visits 22    Date for PT Re-Evaluation 08/19/23    Authorization Type BCBS MCR    Progress Note Due on Visit 18    PT Start Time 1020    PT Stop Time 1100    PT Time Calculation (min) 40 min    Activity Tolerance Patient tolerated treatment well    Behavior During Therapy WFL for tasks assessed/performed                  Past Medical History:  Diagnosis Date   Allergy    Anxiety    Back pain with radiation 07/13/2012   GERD (gastroesophageal reflux disease)    Glaucoma    Hyperlipidemia    Osteoarthritis    Past Surgical History:  Procedure Laterality Date   BREAST CYST EXCISION Right    COLONOSCOPY WITH PROPOFOL N/A 01/02/2022   Procedure: COLONOSCOPY WITH PROPOFOL;  Surgeon: Dolores Frame, MD;  Location: [facility];  Service: Gastroenterology;  Laterality: N/A;  1130   cyst removed from right breast     LAPAROSCOPIC SALPINGO OOPHERECTOMY Right 04/09/2017   Procedure: LAPAROSCOPIC RIGHT SALPINGO OOPHORECTOMY; RIGHT OVARIAN BENIGN CYSTIC TERATOMA;  Surgeon: Lazaro Arms, MD;  Location: [facility];  Service: Gynecology;  Laterality: Right;   PARTIAL HYSTERECTOMY     TUBAL LIGATION     Patient Active Problem List   Diagnosis Date Noted   Family history of coronary artery disease 07/18/2023   Pre-op evaluation 07/06/2023   Nonspecific abnormal electrocardiogram (ECG) (EKG) 07/06/2023   Intermittent palpitations 07/06/2023   Syncope and collapse 07/06/2023   Sciatica of right side 02/27/2023   Osteoarthritis of hips, bilateral 09/24/2022   Cervicalgia 09/25/2020   Seasonal allergies 02/18/2020   Depression, major, single episode, in partial remission (HCC) 10/29/2019   Essential hypertension 01/01/2018    Prediabetes 03/30/2015   Preop cardiovascular exam 04/08/2014   Right hip pain 04/06/2014   Hyperlipemia 06/22/2008   GERD (gastroesophageal reflux disease) 05/24/2008   Allergic rhinitis 01/08/2008   OVERACTIVE BLADDER 04/10/2007   GAD (generalized anxiety disorder) 08/22/2006   OSTEOARTHRITIS 08/22/2006    REFERRING PROVIDER:  Huel Cote, MD  REFERRING DIAG: [phone] (ICD-10-CM) - Tear of right acetabular labrum, initial encounter  THERAPY DIAG:  Stiffness of right hip, not elsewhere classified  Pain in right hip  Muscle weakness (generalized)  Rationale for Evaluation and Treatment: Rehabilitation  ONSET DATE: November/December 2023  SUBJECTIVE:   SUBJECTIVE STATEMENT: "He worked me out last time, and it felt good".    PERTINENT HISTORY: Anxiety Back pain, lumbar spondylosis OA  PAIN:  Are you having pain? Yes NPRS:  4/10 current, Location:  R sided groin and distal anterior thigh Description: pinching Worst pain is with sudden turning   PRECAUTIONS: Other: per dx  WEIGHT BEARING RESTRICTIONS: No  FALLS:  Has patient fallen in last 6 months? Yes. Number of falls 1 fall when she fainted  LIVING ENVIRONMENT: Lives with: lives with boyfriend and son Lives in: 1 story home Stairs: 4 steps to enter home with bilat rails, 8 steps on deck with bilat rails Has following equipment at home: None   PLOF: Independent  PATIENT GOALS: decrease pain, to be able to turn without  pain, improved sleeping   OBJECTIVE:   DIAGNOSTIC FINDINGS:  (Per Epic) R hip MRI: IMPRESSION: 1. Mild degenerative changes at both hips with a small right hip joint effusion. No acute osseous findings. 2. Mild edema within the right hip adductor muscles, likely muscular strain. 3. 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 4. Lower lumbar spondylosis.  R hip x ray: IMPRESSION: Mild bilateral femoroacetabular osteoarthritis.   (Per MD  note) R hip X ray: Very mild degenerative findings with some ossification of the labrum   R hip MRI: There does appear to be a degenerative anterior superior labral tear with 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 PATIENT SURVEYS:  FOTO 58 with a goal of 66 at visit 12   COGNITION: Overall cognitive status: Within functional limits for tasks assessed                           LOWER EXTREMITY ROM:   Active ROM Right eval Left eval Right 07/08/23 Left 07/08/23  Hip flexion 84 102 104 105  Hip extension        Hip abduction Pt unable to perform in supine 28 32 33  Hip adduction        Hip internal rotation        Hip external rotation 18 26    Knee flexion        Knee extension        Ankle dorsiflexion        Ankle plantarflexion        Ankle inversion        Ankle eversion         (Blank rows = not tested)   LOWER EXTREMITY MMT:   MMT Right eval Left eval Right 07/08/23 Left 07/08/23  Hip flexion 4/5 5/5 4+* 5  Hip extension Tolerated min resistance with min pain 4/5    Hip abduction 15.7 21.5 ; 5/5 36.9* 38.8  Hip adduction        Hip internal rotation      5  Hip external rotation 4+/5 w/n available ROM; painful 5/5 w/n available ROM 4* 5  Knee flexion 4/5 5/5 4* 5  Knee extension 4+/5 5/5 4* 5  Ankle dorsiflexion        Ankle plantarflexion        Ankle inversion        Ankle eversion         (Blank rows = not tested)     FUNCTIONAL TESTS:  5x STS test:  22.45  07/08/23: 5xSTS 23.69 seconds without UE use, relies LLE>RLE, RLE dynamic knee valgus   GAIT: Comments: good heel to toe gait, minimally decreased gait speed, decreased BOS  TODAY'S TREATMENT:  08/04/23 Nustep level 6, 5 minutes for dynamic warm up Prone quad stretch 3 x 30 second holds ea Prone hip extension 3 x 10 Sidelying hip  abduction 3 x 10 Supine SLR x10ea (difficult on R LE) Bridge 2x10 Supine march with TrA 2x10 STS from lowered plinth 2 x 10 Marching of airex Gait in hall (cues fo rR hip extension) R hip flexor stret at 8" step- 3x20sec 10/3 Pt seen for aquatic therapy today.  Treatment took place in water 3.5-4.75 ft in depth at the [facility] pool. Temp of water was 91.  Pt entered/exited the pool via stairs alternating step-to and step-through pattern with bilat hand rail.   *walking forward, backward without support x 4 laps *  side stepping without support x 1 lap, then with arm addct/ abdct with rainbow hand floats x 2 laps (cues for technique and breath) * UE on wall:  mini side to side lunges at 4 ft (limited tolerance);  straight LE hip circles clockwise, counter clockwise x 5 each LE, each direction; RLE hip ext x 15  * UE on yellow hand floats:  backwards walking with attention to Rt hip ext * Rt step ups with RUE On wall, x 8, with cues for forward trunk shift with glute activation  *STS from bench onto water step x 10, with 4 sec eccentric lowering and cues for hip hinge, forward arm reach and neutral head position * Rt hip flexor stretch with Lt foot on 2nd step x 20s     Pt requires the buoyancy and hydrostatic pressure of water for support, and to offload joints by unweighting joint load by at least 50 % in navel deep water and by at least 75-80% in chest to neck deep water.  Viscosity of the water is needed for resistance of strengthening. Water current perturbations provides challenge to standing balance requiring increased core activation.  07/29/23 Nustep level 3, seat 6, 5 minutes for dynamic warm up Prone quad stretch 5 x 30 second holds Prone hip extension 2 x 10 Sidelying hip abduction 2 x 10 Seated march 2 x 10 STS from elevated table 3 x 10  07/25/23 Bridge with GTB at knees 3 x 10 Supine clam with GTB 3x10 Supine hooklying march GTB at knees 1 x 10 Hooklying bent  knee fall outs 2 x 10 bilateral  STS from elevated table 2 x 10 Standing hip abduction 3 x 10 bilateral Step up 4 inch 2 x 10 bilateral  07/08/23 Reassessment Bridge with GTB at knees 3 x 10 Supine clam with GTB 3x10 Standing hip abduction 2 x 10 bilateral  06/11/23 Pt performed:  Therapeutic Exercise: Supine bridge x 10 reps and 2x10 with GTB around knees  Supine clam with RTB 3x10  LAQ 3# 2x10  Seated HS curl with RTB x10,x 7 reps  Sidestepping x 2 laps without bilat UE support at rail  Step ups on 4 inch step with rail x10    Neuro Re-ed Activities:  Standing with NBOS on airex x45 sec with UE support on rail toward the end   marching on airex with bilat UE support 2x10  Standing in staggered stance 2x25-30 sec with occasional/frequent UE support on rail with L/R LE back with SBA      PATIENT EDUCATION:  Education details: 10/3 reacquainting to aquatic therapy; exercise modifications  Person educated: Patient Education method: Explanation, demonstration, verbal cuing Education comprehension: verbalized understanding, returned demonstration, verbal cuing required  HOME EXERCISE PROGRAM: Pt  has a land based HEP.  Access Code: [email or web address] URL: [email or web address]  Date: 07/25/2023 - Sit to Stand with Arms Crossed  - 1 x daily - 7 x weekly - 2 sets - 10 reps - Standing Hip Abduction with Counter Support  - 1 x daily - 7 x weekly - 3 sets - 10 reps  07/29/23 - Prone Quadriceps Stretch with Strap  - 1 x daily - 7 x weekly - 5 reps - 20-30 second hold - Prone Hip Extension  - 1 x daily - 7 x weekly - 2 sets - 10 reps  ASSESSMENT:  CLINICAL IMPRESSION: Pt with notable weakness in R quad/hip flexors compared to L, as evidenced with SLR. She denied pain in groin area, only muscular fatigue in anteiror thigh. Able to complete sit to stands from lower surface today without significant difficulty. Decreased hip extension (R) noted with gait observation. This improved  after gentle hip flexor stretching. Pt reported feeling good by end of session.    OBJECTIVE IMPAIRMENTS: decreased activity tolerance, decreased mobility, decreased ROM, decreased strength, hypomobility, impaired flexibility, and pain.   ACTIVITY LIMITATIONS: lifting, sleeping, and turning  PARTICIPATION LIMITATIONS: meal prep and cleaning  PERSONAL FACTORS: 1 comorbidity: back pain  are also affecting patient's functional outcome.   REHAB POTENTIAL: Good  CLINICAL DECISION MAKING: Stable/uncomplicated  EVALUATION COMPLEXITY: Low   GOALS:  SHORT TERM GOALS: Target date: 7/23/204  Pt will tolerate aquatic therapy without adverse effects for improved pain, strength, function, and tolerance to activity.  Baseline: Goal status: INITIAL  2.  Pt will report at least a 25% improvement in pain and sx's overall.  Baseline:  Goal status: MET  3.  Pt will report reduced pain at night and improved sleeping.  Baseline:  Goal status: MET Target date:  05/27/2023  4.  Pt will demo improved R hip AROM to at least 10-15 deg in abd for improved mobility.   Goal status:  MET Target date:  05/27/2023    LONG TERM GOALS: Target date:POC date  Pt will report she is able to perform her normal community ambulation with good stability without her leg wanting to give way.  Baseline:  Goal status: In progress   2.  Pt will perform 5x STS test in < 15 sec for improved functional LE strength.  Baseline:  Goal status: In progress - see chart above  3.  Pt will report at least a 70% reduction in pain with turning/pivoting with daily activities.  Baseline:  Goal status: In progress   4.  Pt will demo 5/5 strength in R hip flexion and knee ext and 20# with HHD testing for hip abduction for improved tolerance to activity and performance of ADLs/IADLs.  Baseline:  Goal status: In progress   5.  Pt will be independent with HEP for improved pain, strength, ROM, and function.  Baseline:  Goal  status:  In progress     PLAN:  PT FREQUENCY:  2 times per week  PT DURATION: 6 weeks  PLANNED INTERVENTIONS: Therapeutic exercises, Therapeutic activity, Neuromuscular re-education, Balance training, Gait training, Patient/Family education, Self Care, Joint mobilization, Stair training, Aquatic Therapy, Dry Needling, Electrical stimulation, Spinal mobilization, Cryotherapy, Moist heat, Taping, Ultrasound, Manual therapy, and Re-evaluation  PLAN FOR NEXT SESSION: Cont with land and aquatic therapy.  Riki Altes, PTA  08/04/23 1:38 PM [facility] [address] [place], [place], [address] Phone: [phone]   Fax:  [phone]

## 2023-08-06 ENCOUNTER — Encounter (HOSPITAL_BASED_OUTPATIENT_CLINIC_OR_DEPARTMENT_OTHER): Payer: Self-pay | Admitting: Physical Therapy

## 2023-08-06 ENCOUNTER — Ambulatory Visit (HOSPITAL_BASED_OUTPATIENT_CLINIC_OR_DEPARTMENT_OTHER): Payer: Medicare Other | Admitting: Physical Therapy

## 2023-08-06 DIAGNOSIS — M25551 Pain in right hip: Secondary | ICD-10-CM | POA: Diagnosis not present

## 2023-08-06 DIAGNOSIS — M25651 Stiffness of right hip, not elsewhere classified: Secondary | ICD-10-CM

## 2023-08-06 DIAGNOSIS — M6281 Muscle weakness (generalized): Secondary | ICD-10-CM

## 2023-08-06 NOTE — Therapy (Signed)
OUTPATIENT PHYSICAL THERAPY TREATMENT NOTE    Patient Name: [name] MRN: [id number] DOB:1951-08-24, 72 y.o., female Today's Date: 08/06/2023   END OF SESSION:  PT End of Session - 08/06/23 0927     Visit Number 13    Number of Visits 22    Date for PT Re-Evaluation 08/19/23    Authorization Type BC[employer] MCR    Progress Note Due on Visit 18    PT Start Time 0915   15 mins late   PT Stop Time 0945    PT Time Calculation (min) 30 min    Activity Tolerance Patient tolerated treatment well    Behavior During Therapy [facility] for tasks assessed/performed                   Past Medical History:  Diagnosis Date   Allergy    Anxiety    Back pain with radiation 07/13/2012   GERD (gastroesophageal reflux disease)    Glaucoma    Hyperlipidemia    Osteoarthritis    Past Surgical History:  Procedure Laterality Date   BREAST CYST EXCISION Right    COLONOSCOPY WITH PROPOFOL N/A 01/02/2022   Procedure: COLONOSCOPY WITH PROPOFOL;  Surgeon: Dolores Frame, MD;  Location: [facility];  Service: Gastroenterology;  Laterality: N/A;  1130   cyst removed from right breast     LAPAROSCOPIC SALPINGO OOPHERECTOMY Right 04/09/2017   Procedure: LAPAROSCOPIC RIGHT SALPINGO OOPHORECTOMY; RIGHT OVARIAN BENIGN CYSTIC TERATOMA;  Surgeon: Lazaro Arms, MD;  Location: [facility];  Service: Gynecology;  Laterality: Right;   PARTIAL HYSTERECTOMY     TUBAL LIGATION     Patient Active Problem List   Diagnosis Date Noted   Family history of coronary artery disease 07/18/2023   Pre-op evaluation 07/06/2023   Nonspecific abnormal electrocardiogram (ECG) (EKG) 07/06/2023   Intermittent palpitations 07/06/2023   Syncope and collapse 07/06/2023   Sciatica of right side 02/27/2023   Osteoarthritis of hips, bilateral 09/24/2022   Cervicalgia 09/25/2020   Seasonal allergies 02/18/2020   Depression, major, single episode, in partial remission (HCC) 10/29/2019   Essential hypertension  01/01/2018   Prediabetes 03/30/2015   Preop cardiovascular exam 04/08/2014   Right hip pain 04/06/2014   Hyperlipemia 06/22/2008   GERD (gastroesophageal reflux disease) 05/24/2008   Allergic rhinitis 01/08/2008   OVERACTIVE BLADDER 04/10/2007   GAD (generalized anxiety disorder) 08/22/2006   OSTEOARTHRITIS 08/22/2006    REFERRING PROVIDER:  Huel Cote, MD  REFERRING DIAG: [phone] (ICD-10-CM) - Tear of right acetabular labrum, initial encounter  THERAPY DIAG:  Stiffness of right hip, not elsewhere classified  Pain in right hip  Muscle weakness (generalized)  Rationale for Evaluation and Treatment: Rehabilitation  ONSET DATE: November/December 2023  SUBJECTIVE:   SUBJECTIVE STATEMENT: "He worked me out last time, and it felt good".    PERTINENT HISTORY: Anxiety Back pain, lumbar spondylosis OA  PAIN:  Are you having pain? Yes NPRS:  4/10 current, Location:  R sided groin and distal anterior thigh Description: pinching Worst pain is with sudden turning   PRECAUTIONS: Other: per dx  WEIGHT BEARING RESTRICTIONS: No  FALLS:  Has patient fallen in last 6 months? Yes. Number of falls 1 fall when she fainted  LIVING ENVIRONMENT: Lives with: lives with boyfriend and son Lives in: 1 story home Stairs: 4 steps to enter home with bilat rails, 8 steps on deck with bilat rails Has following equipment at home: None   PLOF: Independent  PATIENT GOALS: decrease pain, to  be able to turn without pain, improved sleeping   OBJECTIVE:   DIAGNOSTIC FINDINGS:  (Per Epic) R hip MRI: IMPRESSION: 1. Mild degenerative changes at both hips with a small right hip joint effusion. No acute osseous findings. 2. Mild edema within the right hip adductor muscles, likely muscular strain. 3. 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 4. Lower lumbar spondylosis.  R hip x ray: IMPRESSION: Mild bilateral femoroacetabular  osteoarthritis.   (Per MD note) R hip X ray: Very mild degenerative findings with some ossification of the labrum   R hip MRI: There does appear to be a degenerative anterior superior labral tear with 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 PATIENT SURVEYS:  FOTO 58 with a goal of 66 at visit 12   COGNITION: Overall cognitive status: Within functional limits for tasks assessed                           LOWER EXTREMITY ROM:   Active ROM Right eval Left eval Right 07/08/23 Left 07/08/23  Hip flexion 84 102 104 105  Hip extension        Hip abduction Pt unable to perform in supine 28 32 33  Hip adduction        Hip internal rotation        Hip external rotation 18 26    Knee flexion        Knee extension        Ankle dorsiflexion        Ankle plantarflexion        Ankle inversion        Ankle eversion         (Blank rows = not tested)   LOWER EXTREMITY MMT:   MMT Right eval Left eval Right 07/08/23 Left 07/08/23  Hip flexion 4/5 5/5 4+* 5  Hip extension Tolerated min resistance with min pain 4/5    Hip abduction 15.7 21.5 ; 5/5 36.9* 38.8  Hip adduction        Hip internal rotation      5  Hip external rotation 4+/5 w/n available ROM; painful 5/5 w/n available ROM 4* 5  Knee flexion 4/5 5/5 4* 5  Knee extension 4+/5 5/5 4* 5  Ankle dorsiflexion        Ankle plantarflexion        Ankle inversion        Ankle eversion         (Blank rows = not tested)     FUNCTIONAL TESTS:  5x STS test:  22.45  07/08/23: 5xSTS 23.69 seconds without UE use, relies LLE>RLE, RLE dynamic knee valgus   GAIT: Comments: good heel to toe gait, minimally decreased gait speed, decreased BOS  TODAY'S TREATMENT:  Pt seen for aquatic therapy today.  Treatment took place in water 3.5-4.75 ft in depth at the [facility] pool.  Temp of water was 91.  Pt entered/exited the pool via stairs alternating step-to and step-through pattern with bilat hand rail.   *walking forward, backward without support x 4 laps *  side stepping without support x 2 lap * UE on wall:  mini side to side lunges at 4 ft; straight LE hip circles clockwise, counter clockwise x 10 each LE, each direction;  *Side lunge with yellow HB x 5 R/L *Standing RLE hip ext x 15 (pt reports movement feels good in glut; SLS rle to complete left with some discomfort *hollow noodle stomp.  VC and demonstration for execution. Completed in hip neutral then ER. Cues for eccentric control. 2 sets slow 1 set fast * UE on yellow hand floats:  backwards walking with attention to Rt hip ext      Pt requires the buoyancy and hydrostatic pressure of water for support, and to offload joints by unweighting joint load by at least 50 % in navel deep water and by at least 75-80% in chest to neck deep water.  Viscosity of the water is needed for resistance of strengthening. Water current perturbations provides challenge to standing balance requiring increased core activation.   08/04/23 Nustep level 6, 5 minutes for dynamic warm up Prone quad stretch 3 x 30 second holds ea Prone hip extension 3 x 10 Sidelying hip abduction 3 x 10 Supine SLR x10ea (difficult on R LE) Bridge 2x10 Supine march with TrA 2x10 STS from lowered plinth 2 x 10 Marching of airex Gait in hall (cues fo rR hip extension) R hip flexor stret at 8" step- 3x20sec  10/3 Pt seen for aquatic therapy today.  Treatment took place in water 3.5-4.75 ft in depth at the [facility] pool. Temp of water was 91.  Pt entered/exited the pool via stairs alternating step-to and step-through pattern with bilat hand rail.   *walking forward, backward without support x 4 laps *  side stepping without support x 1 lap, then with arm addct/ abdct with rainbow hand floats x 2 laps (cues for technique and  breath) * UE on wall:  mini side to side lunges at 4 ft (limited tolerance);  straight LE hip circles clockwise, counter clockwise x 5 each LE, each direction; RLE hip ext x 15  * UE on yellow hand floats:  backwards walking with attention to Rt hip ext * Rt step ups with RUE On wall, x 8, with cues for forward trunk shift with glute activation  *STS from bench onto water step x 10, with 4 sec eccentric lowering and cues for hip hinge, forward arm reach and neutral head position * Rt hip flexor stretch with Lt foot on 2nd step x 20s     Pt requires the buoyancy and hydrostatic pressure of water for support, and to offload joints by unweighting joint load by at least 50 % in navel deep water and by at least 75-80% in chest to neck deep water.  Viscosity of the water is needed for resistance of strengthening. Water current perturbations provides challenge to standing balance requiring increased core activation.  07/29/23 Nustep level 3, seat 6, 5 minutes for dynamic warm up Prone quad stretch 5 x 30 second holds Prone hip extension 2 x 10 Sidelying hip abduction 2 x 10 Seated march 2 x 10 STS from elevated table 3 x 10  07/25/23  Bridge with GTB at knees 3 x 10 Supine clam with GTB 3x10 Supine hooklying march GTB at knees 1 x 10 Hooklying bent knee fall outs 2 x 10 bilateral  STS from elevated table 2 x 10 Standing hip abduction 3 x 10 bilateral Step up 4 inch 2 x 10 bilateral  07/08/23 Reassessment Bridge with GTB at knees 3 x 10 Supine clam with GTB 3x10 Standing hip abduction 2 x 10 bilateral  06/11/23 Pt performed:  Therapeutic Exercise: Supine bridge x 10 reps and 2x10 with GTB around knees  Supine clam with RTB 3x10  LAQ 3# 2x10  Seated HS curl with RTB x10,x 7 reps  Sidestepping x 2 laps without bilat UE support at rail  Step ups on 4 inch step with rail x10    Neuro Re-ed Activities:  Standing with NBOS on airex x45 sec with UE support on rail toward the end   marching  on airex with bilat UE support 2x10  Standing in staggered stance 2x25-30 sec with occasional/frequent UE support on rail with L/R LE back with SBA      PATIENT EDUCATION:  Education details: 10/3 reacquainting to aquatic therapy; exercise modifications  Person educated: Patient Education method: Explanation, demonstration, verbal cuing Education comprehension: verbalized understanding, returned demonstration, verbal cuing required  HOME EXERCISE PROGRAM: Pt has a land based HEP.  Access Code: [email or web address] URL: [email or web address]  Date: 07/25/2023 - Sit to Stand with Arms Crossed  - 1 x daily - 7 x weekly - 2 sets - 10 reps - Standing Hip Abduction with Counter Support  - 1 x daily - 7 x weekly - 3 sets - 10 reps  07/29/23 - Prone Quadriceps Stretch with Strap  - 1 x daily - 7 x weekly - 5 reps - 20-30 second hold - Prone Hip Extension  - 1 x daily - 7 x weekly - 2 sets - 10 reps  ASSESSMENT:  CLINICAL IMPRESSION: Pt reports overall pain is down ~ 30% since onset of therapy.  She is hoping for clearance soon to be able to go forward with hip surgery. She tolerates with good response additional glute engagement with noodle press. Demonstrates good execution. Initial apprehension which lead to some guarding which decreases as exercise progress All STG met   OBJECTIVE IMPAIRMENTS: decreased activity tolerance, decreased mobility, decreased ROM, decreased strength, hypomobility, impaired flexibility, and pain.   ACTIVITY LIMITATIONS: lifting, sleeping, and turning  PARTICIPATION LIMITATIONS: meal prep and cleaning  PERSONAL FACTORS: 1 comorbidity: back pain  are also affecting patient's functional outcome.   REHAB POTENTIAL: Good  CLINICAL DECISION MAKING: Stable/uncomplicated  EVALUATION COMPLEXITY: Low   GOALS:  SHORT TERM GOALS: Target date: 7/23/204  Pt will tolerate aquatic therapy without adverse effects for improved pain, strength, function, and  tolerance to activity.  Baseline: Goal status:MET 08/06/23  2.  Pt will report at least a 25% improvement in pain and sx's overall.  Baseline:  Goal status: MET 08/06/23  3.  Pt will report reduced pain at night and improved sleeping.  Baseline:  Goal status: MET Target date:  05/27/2023  4.  Pt will demo improved R hip AROM to at least 10-15 deg in abd for improved mobility.   Goal status:  MET Target date:  05/27/2023    LONG TERM GOALS: Target date:POC date  Pt will report she is able to perform her normal community ambulation with good stability without her leg wanting to give way.  Baseline:  Goal status: In  progress   2.  Pt will perform 5x STS test in < 15 sec for improved functional LE strength.  Baseline:  Goal status: In progress - see chart above  3.  Pt will report at least a 70% reduction in pain with turning/pivoting with daily activities.  Baseline:  Goal status: In progress   4.  Pt will demo 5/5 strength in R hip flexion and knee ext and 20# with HHD testing for hip abduction for improved tolerance to activity and performance of ADLs/IADLs.  Baseline:  Goal status: In progress   5.  Pt will be independent with HEP for improved pain, strength, ROM, and function.  Baseline:  Goal status:  In progress     PLAN:  PT FREQUENCY:  2 times per week  PT DURATION: 6 weeks  PLANNED INTERVENTIONS: Therapeutic exercises, Therapeutic activity, Neuromuscular re-education, Balance training, Gait training, Patient/Family education, Self Care, Joint mobilization, Stair training, Aquatic Therapy, Dry Needling, Electrical stimulation, Spinal mobilization, Cryotherapy, Moist heat, Taping, Ultrasound, Manual therapy, and Re-evaluation  PLAN FOR NEXT SESSION: Cont with land and aquatic therapy.  Corrie Dandy Friendly) Deneice Wack MPT  08/06/23 12:34 PM [facility] [address] [place], [place], [address] Phone: [phone]    Fax:  [phone]

## 2023-08-08 ENCOUNTER — Telehealth: Payer: Self-pay | Admitting: Orthopaedic Surgery

## 2023-08-08 NOTE — Telephone Encounter (Signed)
I spoke with the patient and scheduled her for surgery on 09/04/23. Patient states she has been taking Advil / Tylenol combo for her pain. It helps for about two hours after she takes it at the most. Patient would like to know if you would call her in a few pain pills to take before surgery when pain is at it's worst? Patient uses [facility] in [place]. Please call to advise.

## 2023-08-11 ENCOUNTER — Ambulatory Visit (HOSPITAL_BASED_OUTPATIENT_CLINIC_OR_DEPARTMENT_OTHER): Payer: Medicare Other | Admitting: Physical Therapy

## 2023-08-11 ENCOUNTER — Other Ambulatory Visit (HOSPITAL_BASED_OUTPATIENT_CLINIC_OR_DEPARTMENT_OTHER): Payer: Self-pay | Admitting: Orthopaedic Surgery

## 2023-08-11 ENCOUNTER — Telehealth: Payer: Self-pay

## 2023-08-11 DIAGNOSIS — H524 Presbyopia: Secondary | ICD-10-CM | POA: Diagnosis not present

## 2023-08-11 DIAGNOSIS — S73191A Other sprain of right hip, initial encounter: Secondary | ICD-10-CM

## 2023-08-11 NOTE — Telephone Encounter (Signed)
Patient informed and verbalized understanding of plan. 

## 2023-08-11 NOTE — Telephone Encounter (Signed)
-----   Message from [name] sent at 08/01/2023 10:39 AM EDT ----- Normal pumping function of the heart, normal diastology and no valvular heart disease.  Patient is low risk for any perioperative cardiac complications for hip arthroscopy procedure.

## 2023-08-11 NOTE — Telephone Encounter (Signed)
Attempted CB but VM was full

## 2023-08-12 NOTE — Telephone Encounter (Signed)
Called and advised pt. She stated understanding

## 2023-08-13 ENCOUNTER — Encounter (HOSPITAL_BASED_OUTPATIENT_CLINIC_OR_DEPARTMENT_OTHER): Payer: Self-pay | Admitting: Physical Therapy

## 2023-08-13 ENCOUNTER — Ambulatory Visit (HOSPITAL_BASED_OUTPATIENT_CLINIC_OR_DEPARTMENT_OTHER): Payer: Medicare Other | Admitting: Physical Therapy

## 2023-08-13 DIAGNOSIS — M25551 Pain in right hip: Secondary | ICD-10-CM | POA: Diagnosis not present

## 2023-08-13 DIAGNOSIS — M6281 Muscle weakness (generalized): Secondary | ICD-10-CM

## 2023-08-13 DIAGNOSIS — M25651 Stiffness of right hip, not elsewhere classified: Secondary | ICD-10-CM | POA: Diagnosis not present

## 2023-08-13 NOTE — Therapy (Signed)
OUTPATIENT PHYSICAL THERAPY TREATMENT NOTE    Patient Name: [name] MRN: [id number] DOB:01/15/1951, 72 y.o., female Today's Date: 08/13/2023   END OF SESSION:  PT End of Session - 08/13/23 0913     Visit Number 14    Number of Visits 22    Date for PT Re-Evaluation 08/19/23    Authorization Type BCBS MCR    Progress Note Due on Visit 18    PT Start Time 0910   pt arrived late   PT Stop Time 0944    PT Time Calculation (min) 34 min    Activity Tolerance Patient tolerated treatment well    Behavior During Therapy [facility] for tasks assessed/performed                   Past Medical History:  Diagnosis Date   Allergy    Anxiety    Back pain with radiation 07/13/2012   GERD (gastroesophageal reflux disease)    Glaucoma    Hyperlipidemia    Osteoarthritis    Past Surgical History:  Procedure Laterality Date   BREAST CYST EXCISION Right    COLONOSCOPY WITH PROPOFOL N/A 01/02/2022   Procedure: COLONOSCOPY WITH PROPOFOL;  Surgeon: Dolores Frame, MD;  [facility]: [facility];  Service: Gastroenterology;  Laterality: N/A;  1130   cyst removed from right breast     LAPAROSCOPIC SALPINGO OOPHERECTOMY Right 04/09/2017   Procedure: LAPAROSCOPIC RIGHT SALPINGO OOPHORECTOMY; RIGHT OVARIAN BENIGN CYSTIC TERATOMA;  Surgeon: Lazaro Arms, MD;  Location: [facility];  Service: Gynecology;  Laterality: Right;   PARTIAL HYSTERECTOMY     TUBAL LIGATION     Patient Active Problem List   Diagnosis Date Noted   Family history of coronary artery disease 07/18/2023   Pre-op evaluation 07/06/2023   Nonspecific abnormal electrocardiogram (ECG) (EKG) 07/06/2023   Intermittent palpitations 07/06/2023   Syncope and collapse 07/06/2023   Sciatica of right side 02/27/2023   Osteoarthritis of hips, bilateral 09/24/2022   Cervicalgia 09/25/2020   Seasonal allergies 02/18/2020   Depression, major, single episode, in partial remission (HCC) 10/29/2019   Essential hypertension  01/01/2018   Prediabetes 03/30/2015   Preop cardiovascular exam 04/08/2014   Right hip pain 04/06/2014   Hyperlipemia 06/22/2008   GERD (gastroesophageal reflux disease) 05/24/2008   Allergic rhinitis 01/08/2008   OVERACTIVE BLADDER 04/10/2007   GAD (generalized anxiety disorder) 08/22/2006   OSTEOARTHRITIS 08/22/2006    REFERRING PROVIDER:  Huel Cote, MD  REFERRING DIAG: [phone] (ICD-10-CM) - Tear of right acetabular labrum, initial encounter  THERAPY DIAG:  Stiffness of right hip, not elsewhere classified  Pain in right hip  Muscle weakness (generalized)  Rationale for Evaluation and Treatment: Rehabilitation  ONSET DATE: November/December 2023  SUBJECTIVE:   SUBJECTIVE STATEMENT: Pt reports she is scheduled for surgery 11/7.  No new changes since last visit.    PERTINENT HISTORY: Anxiety Back pain, lumbar spondylosis OA  PAIN:  Are you having pain? Yes NPRS:  4/10 current, Location:  R sided groin and distal anterior thigh Description: pinching Worst pain is with sudden turning   PRECAUTIONS: Other: per dx  WEIGHT BEARING RESTRICTIONS: No  FALLS:  Has patient fallen in last 6 months? Yes. Number of falls 1 fall when she fainted  LIVING ENVIRONMENT: Lives with: lives with boyfriend and son Lives in: 1 story home Stairs: 4 steps to enter home with bilat rails, 8 steps on deck with bilat rails Has following equipment at home: None   PLOF: Independent  PATIENT GOALS: decrease pain, to be able to turn without pain, improved sleeping   OBJECTIVE:   DIAGNOSTIC FINDINGS:  (Per Epic) R hip MRI: IMPRESSION: 1. Mild degenerative changes at both hips with a small right hip joint effusion. No acute osseous findings. 2. Mild edema within the right hip adductor muscles, likely muscular strain. 3. 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 4. Lower lumbar spondylosis.  R hip x ray: IMPRESSION: Mild bilateral  femoroacetabular osteoarthritis.   (Per MD note) R hip X ray: Very mild degenerative findings with some ossification of the labrum   R hip MRI: There does appear to be a degenerative anterior superior labral tear with 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 PATIENT SURVEYS:  FOTO 58 with a goal of 66 at visit 12   COGNITION: Overall cognitive status: Within functional limits for tasks assessed                           LOWER EXTREMITY ROM:   Active ROM Right eval Left eval Right 07/08/23 Left 07/08/23  Hip flexion 84 102 104 105  Hip extension        Hip abduction Pt unable to perform in supine 28 32 33  Hip adduction        Hip internal rotation        Hip external rotation 18 26    Knee flexion        Knee extension        Ankle dorsiflexion        Ankle plantarflexion        Ankle inversion        Ankle eversion         (Blank rows = not tested)   LOWER EXTREMITY MMT:   MMT Right eval Left eval Right 07/08/23 Left 07/08/23  Hip flexion 4/5 5/5 4+* 5  Hip extension Tolerated min resistance with min pain 4/5    Hip abduction 15.7 21.5 ; 5/5 36.9* 38.8  Hip adduction        Hip internal rotation      5  Hip external rotation 4+/5 w/n available ROM; painful 5/5 w/n available ROM 4* 5  Knee flexion 4/5 5/5 4* 5  Knee extension 4+/5 5/5 4* 5  Ankle dorsiflexion        Ankle plantarflexion        Ankle inversion        Ankle eversion         (Blank rows = not tested)     FUNCTIONAL TESTS:  5x STS test:  22.45  07/08/23: 5xSTS 23.69 seconds without UE use, relies LLE>RLE, RLE dynamic knee valgus   GAIT: Comments: good heel to toe gait, minimally decreased gait speed, decreased BOS  TODAY'S TREATMENT:  Pt seen for aquatic therapy today.  Treatment took place in water 3.5-4.75 ft in depth at the [facility] pool. Temp of water was 91.  Pt entered/exited the pool via stairs alternating step-to and step-through pattern with bilat hand rail.   *walking forward, backward without support x 2 *  side stepping arm addct/ abdct with rainbow hand floats x 2 lap * UE on yellow noodle: 3 way LE kick x 5 each LE (painful in Rt hip abdct and rt hip ext;  Rt hip ext to toe taps x 5; backward walking with long Rt stride * UE on wall:  straight LE hip circles clockwise, counter clockwise x 8 each direction;  * Rt lateral step ups with RUE on rail x 10 * Lt/Rt forward step down, retro step up x 5 each * STS at bench in water with blue step under feet x 10 with cues for forward reach and hip hinge * monster walk with heavy cues for technique 1.5 laps   08/06/23 Pt seen for aquatic therapy today.  Treatment took place in water 3.5-4.75 ft in depth at the [facility] pool. Temp of water was 91.  Pt entered/exited the pool via stairs alternating step-to and step-through pattern with bilat hand rail.   *walking forward, backward without support x 4 laps *  side stepping without support x 2 lap * UE on wall:  mini side to side lunges at 4 ft; straight LE hip circles clockwise, counter clockwise x 10 each LE, each direction;  *Side lunge with yellow HB x 5 R/L *Standing RLE hip ext x 15 (pt reports movement feels good in glut; SLS rle to complete left with some discomfort *hollow noodle stomp.  VC and demonstration for execution. Completed in hip neutral then ER. Cues for eccentric control. 2 sets slow 1 set fast * UE on yellow hand floats:  backwards walking with attention to Rt hip ext   08/04/23 Nustep level 6, 5 minutes for dynamic warm up Prone quad stretch 3 x 30 second holds ea Prone hip extension 3 x 10 Sidelying hip abduction 3 x 10 Supine SLR x10ea (difficult on R LE) Bridge 2x10 Supine march with TrA 2x10 STS from lowered plinth 2 x 10 Marching of airex Gait in hall (cues fo rR  hip extension) R hip flexor stret at 8" step- 3x20sec   PATIENT EDUCATION:  Education details: aquatic therapy; exercise modifications  Person educated: Patient Education method: Explanation, demonstration, verbal cuing Education comprehension: verbalized understanding, returned demonstration, verbal cuing required  HOME EXERCISE PROGRAM: Pt has a land based HEP.  Access Code: [email or web address] URL: [email or web address]  Date: 07/25/2023 - Sit to Stand with Arms Crossed  - 1 x daily - 7 x weekly - 2 sets - 10 reps - Standing Hip Abduction with Counter Support  - 1 x daily - 7 x weekly - 3 sets - 10 reps  07/29/23 - Prone Quadriceps Stretch with Strap  - 1 x daily - 7 x weekly - 5 reps - 20-30 second hold - Prone Hip Extension  - 1 x daily - 7 x weekly - 2 sets - 10 reps  ASSESSMENT:  CLINICAL IMPRESSION: Pt continues to report relief with Rt hip flexor stretch, and increased pain with Rt hip abdct.  She tolerated session well without increase in symptoms.  Plan to test STS next visit for LTG.  Pt progressing towards remaining goals.    OBJECTIVE IMPAIRMENTS: decreased activity tolerance, decreased mobility, decreased  ROM, decreased strength, hypomobility, impaired flexibility, and pain.   ACTIVITY LIMITATIONS: lifting, sleeping, and turning  PARTICIPATION LIMITATIONS: meal prep and cleaning  PERSONAL FACTORS: 1 comorbidity: back pain  are also affecting patient's functional outcome.   REHAB POTENTIAL: Good  CLINICAL DECISION MAKING: Stable/uncomplicated  EVALUATION COMPLEXITY: Low   GOALS:  SHORT TERM GOALS: Target date: 7/23/204  Pt will tolerate aquatic therapy without adverse effects for improved pain, strength, function, and tolerance to activity.  Baseline: Goal status:MET 08/06/23  2.  Pt will report at least a 25% improvement in pain and sx's overall.  Baseline:  Goal status: MET 08/06/23  3.  Pt will report reduced pain at night and improved sleeping.   Baseline:  Goal status: MET Target date:  05/27/2023  4.  Pt will demo improved R hip AROM to at least 10-15 deg in abd for improved mobility.   Goal status:  MET Target date:  05/27/2023    LONG TERM GOALS: Target date:POC date  Pt will report she is able to perform her normal community ambulation with good stability without her leg wanting to give way.  Baseline:  Goal status: In progress   2.  Pt will perform 5x STS test in < 15 sec for improved functional LE strength.  Baseline:  Goal status: In progress - see chart above  3.  Pt will report at least a 70% reduction in pain with turning/pivoting with daily activities.  Baseline:  Goal status: In progress   4.  Pt will demo 5/5 strength in R hip flexion and knee ext and 20# with HHD testing for hip abduction for improved tolerance to activity and performance of ADLs/IADLs.  Baseline:  Goal status: In progress   5.  Pt will be independent with HEP for improved pain, strength, ROM, and function.  Baseline:  Goal status:  In progress     PLAN:  PT FREQUENCY:  2 times per week  PT DURATION: 6 weeks  PLANNED INTERVENTIONS: Therapeutic exercises, Therapeutic activity, Neuromuscular re-education, Balance training, Gait training, Patient/Family education, Self Care, Joint mobilization, Stair training, Aquatic Therapy, Dry Needling, Electrical stimulation, Spinal mobilization, Cryotherapy, Moist heat, Taping, Ultrasound, Manual therapy, and Re-evaluation  PLAN FOR NEXT SESSION: Cont with land and aquatic therapy.  Mayer Camel, PTA 08/13/23 12:33 PM [facility] [address] [place], [place], [address] Phone: [phone]   Fax:  [phone]

## 2023-08-17 NOTE — Therapy (Signed)
OUTPATIENT PHYSICAL THERAPY TREATMENT NOTE / PROGRESS NOTE  Progress Note Reporting Period 07/24/2023 to 08/18/2023  See note below for Objective Data and Assessment of Progress/Goals.       Patient Name: [name] MRN: [id number] DOB:1951/09/27, 72 y.o., female Today's Date: 08/18/2023   END OF SESSION:  PT End of Session - 08/18/23 1030     Visit Number 15    Number of Visits 17    Date for PT Re-Evaluation 08/25/23    Authorization Type BCBS MCR    PT Start Time 0941    PT Stop Time 1020    PT Time Calculation (min) 39 min    Activity Tolerance Patient tolerated treatment well    Behavior During Therapy [facility] for tasks assessed/performed                    Past Medical History:  Diagnosis Date   Allergy    Anxiety    Back pain with radiation 07/13/2012   GERD (gastroesophageal reflux disease)    Glaucoma    Hyperlipidemia    Osteoarthritis    Past Surgical History:  Procedure Laterality Date   BREAST CYST EXCISION Right    COLONOSCOPY WITH PROPOFOL N/A 01/02/2022   Procedure: COLONOSCOPY WITH PROPOFOL;  Surgeon: Dolores Frame, MD;  [facility];  Service: Gastroenterology;  Laterality: N/A;  1130   cyst removed from right breast     LAPAROSCOPIC SALPINGO OOPHERECTOMY Right 04/09/2017   Procedure: LAPAROSCOPIC RIGHT SALPINGO OOPHORECTOMY; RIGHT OVARIAN BENIGN CYSTIC TERATOMA;  Surgeon: Lazaro Arms, MD;  Location: [facility];  Service: Gynecology;  Laterality: Right;   PARTIAL HYSTERECTOMY     TUBAL LIGATION     Patient Active Problem List   Diagnosis Date Noted   Family history of coronary artery disease 07/18/2023   Pre-op evaluation 07/06/2023   Nonspecific abnormal electrocardiogram (ECG) (EKG) 07/06/2023   Intermittent palpitations 07/06/2023   Syncope and collapse 07/06/2023   Sciatica of right side 02/27/2023   Osteoarthritis of hips, bilateral 09/24/2022   Cervicalgia 09/25/2020   Seasonal allergies 02/18/2020    Depression, major, single episode, in partial remission (HCC) 10/29/2019   Essential hypertension 01/01/2018   Prediabetes 03/30/2015   Preop cardiovascular exam 04/08/2014   Right hip pain 04/06/2014   Hyperlipemia 06/22/2008   GERD (gastroesophageal reflux disease) 05/24/2008   Allergic rhinitis 01/08/2008   OVERACTIVE BLADDER 04/10/2007   GAD (generalized anxiety disorder) 08/22/2006   OSTEOARTHRITIS 08/22/2006    REFERRING PROVIDER:  Huel Cote, MD  REFERRING DIAG: [phone] (ICD-10-CM) - Tear of right acetabular labrum, initial encounter  THERAPY DIAG:  Stiffness of right hip, not elsewhere classified  Pain in right hip  Muscle weakness (generalized)  Rationale for Evaluation and Treatment: Rehabilitation  ONSET DATE: November/December 2023  SUBJECTIVE:   SUBJECTIVE STATEMENT: Pt states it's improving and therapy helps though she has pain everyday.  Pt states it feels good in therapy and she feels good walking to her car.  Pt states the pain returns in about an hour and a half later after Rx.  Pt states her leg still gives way with ambulation.  Pt reports her leg giving way 1-2x/wk.  Pt continues to have pain with pivoting/turning.  Pt reports she is scheduled for surgery 11/7.    PERTINENT HISTORY: Anxiety Back pain, lumbar spondylosis OA  PAIN:  Are you having pain? Yes NPRS:  5/10 current,  8-9/10 worst, 1/10 best Location:  R sided groin and distal  anterior thigh Description: pinching Worst pain is with sudden turning   PRECAUTIONS: Other: per dx  WEIGHT BEARING RESTRICTIONS: No  FALLS:  Has patient fallen in last 6 months? Yes. Number of falls 1 fall when she fainted  LIVING ENVIRONMENT: Lives with: lives with boyfriend and son Lives in: 1 story home Stairs: 4 steps to enter home with bilat rails, 8 steps on deck with bilat rails Has following equipment at home: None   PLOF: Independent  PATIENT GOALS: decrease pain, to be able to turn  without pain, improved sleeping   OBJECTIVE:   DIAGNOSTIC FINDINGS:  (Per Epic) R hip MRI: IMPRESSION: 1. Mild degenerative changes at both hips with a small right hip joint effusion. No acute osseous findings. 2. Mild edema within the right hip adductor muscles, likely muscular strain. 3. 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 4. Lower lumbar spondylosis.  R hip x ray: IMPRESSION: Mild bilateral femoroacetabular osteoarthritis.   (Per MD note) R hip X ray: Very mild degenerative findings with some ossification of the labrum   R hip MRI: There does appear to be a degenerative anterior superior labral tear with 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  COGNITION: Overall cognitive status: Within functional limits for tasks assessed                         TODAY'S TREATMENT:     PATIENT SURVEYS:  FOTO:  Initial/Current:  58/49 with a goal of 66 at visit 12    LOWER EXTREMITY ROM:   Active ROM Right eval Left eval Right 07/08/23 Left 07/08/23 Right 10/21  Hip flexion 84 102 104 105 92  Hip extension         Hip abduction Pt unable to perform in supine 28 32 33 25  Hip adduction         Hip internal rotation         Hip external rotation 18 26   23   Knee flexion         Knee extension         Ankle dorsiflexion         Ankle plantarflexion         Ankle inversion         Ankle eversion          (Blank rows = not tested)   LOWER EXTREMITY MMT:   MMT Right eval Left eval Right 07/08/23 Left 07/08/23 Right 10/21 Left 10/21  Hip flexion 4/5 5/5 4+* 5 4+/5   Hip extension Tolerated min resistance with min pain 4/5   4-/5 with min pain   Hip abduction 15.7 21.5 ; 5/5 36.9* 38.8 20.5 28.4  Hip adduction          Hip internal rotation      5    Hip external rotation 4+/5 w/n available ROM; painful 5/5 w/n available ROM 4* 5 4-/5 with pain   Knee flexion 4/5 5/5 4* 5 5/5   Knee extension  4+/5 5/5 4* 5 4+/5   Ankle dorsiflexion          Ankle plantarflexion          Ankle inversion          Ankle eversion           (Blank rows = not tested)     FUNCTIONAL TESTS:  07/08/23: 5xSTS 23.69 seconds without UE use, relies  LLE>RLE, RLE dynamic knee valgus  08/18/23: 5xSTS 17.08 seconds without UE's  GAIT: Comments: good heel to toe gait, minimally decreased gait speed, decreased BOS.  Pt has a tentative gait pattern.                                                                                                                              Sidelying hip abduction 3 x 10 Attempted Supine SLR on R LE though PT had pt stop due to difficulty and pain Bridge 3x10 Supine march with TrA 2x10 Marching on airex 4x5  See below for pt education  PATIENT EDUCATION:  Education details:  PT educated pt concerning POC.  Exercise form, goal progress, and objective findings.  Person educated: Patient Education method: Explanation, demonstration, verbal cuing Education comprehension: verbalized understanding, returned demonstration, verbal cuing required  HOME EXERCISE PROGRAM: Pt has a land based HEP.  Access Code: [email or web address] URL: [email or web address]  Date: 07/25/2023 - Sit to Stand with Arms Crossed  - 1 x daily - 7 x weekly - 2 sets - 10 reps - Standing Hip Abduction with Counter Support  - 1 x daily - 7 x weekly - 3 sets - 10 reps  07/29/23 - Prone Quadriceps Stretch with Strap  - 1 x daily - 7 x weekly - 5 reps - 20-30 second hold - Prone Hip Extension  - 1 x daily - 7 x weekly - 2 sets - 10 reps  ASSESSMENT:  CLINICAL IMPRESSION: Pt states therapy is helping though she continues to have pain everyday.  Pt reports short term pain relief from therapy.  Pt continues to c/o of leg giving way with ambulation which occurs 1-2x/wk.  She continues to have pain with pivoting/turning.  Pt demonstrates improved strength t/o R hip and knee.  Though she has improved in hip  strength, she continues to have hip weakness.  Pt demonstrates improved functional LE strength and performance of sit/stand transfers as evidenced by a 6 second improvement in 5x STS test.  Pt has a tentative gait pattern.  Pt demonstrates worsened self perceived disability with FOTO score worsening from 58 to 49.  Pt has met all STG's and is progressing toward LTG's.  Pt is planning to have surgery on 11/7.  She may benefit for 2 more visits of PT to further improve strength, stability, mobility, and function and to ensure independence with HEP prior to surgery.    OBJECTIVE IMPAIRMENTS: decreased activity tolerance, decreased mobility, decreased ROM, decreased strength, hypomobility, impaired flexibility, and pain.   ACTIVITY LIMITATIONS: lifting, sleeping, and turning  PARTICIPATION LIMITATIONS: meal prep and cleaning  PERSONAL FACTORS: 1 comorbidity: back pain  are also affecting patient's functional outcome.   REHAB POTENTIAL: Good  CLINICAL DECISION MAKING: Stable/uncomplicated  EVALUATION COMPLEXITY: Low   GOALS:  SHORT TERM GOALS: Target date: 7/23/204  Pt will tolerate aquatic therapy without adverse effects for improved pain, strength, function, and tolerance to activity.  Baseline: Goal  status:MET 08/06/23  2.  Pt will report at least a 25% improvement in pain and sx's overall.  Baseline:  Goal status: MET 08/06/23  3.  Pt will report reduced pain at night and improved sleeping.  Baseline:  Goal status: MET Target date:  05/27/2023  4.  Pt will demo improved R hip AROM to at least 10-15 deg in abd for improved mobility.   Goal status:  MET Target date:  05/27/2023    LONG TERM GOALS: Target date:POC date  Pt will report she is able to perform her normal community ambulation with good stability without her leg wanting to give way.  Baseline:  Goal status: ONGOING  10/21  2.  Pt will perform 5x STS test in < 15 sec for improved functional LE strength.  Baseline:   Goal status: PROGRESSING 08/18/23  3.  Pt will report at least a 70% reduction in pain with turning/pivoting with daily activities.  Baseline:  Goal status:   PROGRESSING  10/21  4.  Pt will demo 5/5 strength in R hip flexion and knee ext and 20# with HHD testing for hip abduction for improved tolerance to activity and performance of ADLs/IADLs.  Baseline:  Goal status: 33% MET  10/21  5.  Pt will be independent with HEP for improved pain, strength, ROM, and function.  Baseline:  Goal status:  PROGRESSING  10/21    PLAN:  PT FREQUENCY:  2 more visits.  PT DURATION: 1 week  PLANNED INTERVENTIONS: Therapeutic exercises, Therapeutic activity, Neuromuscular re-education, Balance training, Gait training, Patient/Family education, Self Care, Joint mobilization, Stair training, Aquatic Therapy, Dry Needling, Electrical stimulation, Spinal mobilization, Cryotherapy, Moist heat, Taping, Ultrasound, Manual therapy, and Re-evaluation  PLAN FOR NEXT SESSION:  Pt to be seen for 1 more visit in the pool and 1 more visit on land prior to surgery.    Audie Clear III PT, DPT 08/18/23 2:47 PM

## 2023-08-18 ENCOUNTER — Other Ambulatory Visit: Payer: Self-pay | Admitting: Family Medicine

## 2023-08-18 ENCOUNTER — Ambulatory Visit (HOSPITAL_BASED_OUTPATIENT_CLINIC_OR_DEPARTMENT_OTHER): Payer: Medicare Other | Admitting: Physical Therapy

## 2023-08-18 ENCOUNTER — Encounter (HOSPITAL_BASED_OUTPATIENT_CLINIC_OR_DEPARTMENT_OTHER): Payer: Self-pay | Admitting: Physical Therapy

## 2023-08-18 DIAGNOSIS — M25651 Stiffness of right hip, not elsewhere classified: Secondary | ICD-10-CM

## 2023-08-18 DIAGNOSIS — M6281 Muscle weakness (generalized): Secondary | ICD-10-CM

## 2023-08-18 DIAGNOSIS — M25551 Pain in right hip: Secondary | ICD-10-CM

## 2023-08-20 ENCOUNTER — Encounter (HOSPITAL_BASED_OUTPATIENT_CLINIC_OR_DEPARTMENT_OTHER): Payer: Self-pay | Admitting: Physical Therapy

## 2023-08-20 ENCOUNTER — Ambulatory Visit (HOSPITAL_BASED_OUTPATIENT_CLINIC_OR_DEPARTMENT_OTHER): Payer: Medicare Other | Admitting: Physical Therapy

## 2023-08-20 DIAGNOSIS — M6281 Muscle weakness (generalized): Secondary | ICD-10-CM

## 2023-08-20 DIAGNOSIS — M25551 Pain in right hip: Secondary | ICD-10-CM | POA: Diagnosis not present

## 2023-08-20 DIAGNOSIS — M25651 Stiffness of right hip, not elsewhere classified: Secondary | ICD-10-CM

## 2023-08-20 NOTE — Therapy (Signed)
OUTPATIENT PHYSICAL THERAPY TREATMENT NOTE    Patient Name: [name] MRN: [id number] DOB:1951/04/20, 72 y.o., female Today's Date: 08/20/2023   END OF SESSION:  PT End of Session - 08/20/23 0924     Visit Number 16    Number of Visits 17    Date for PT Re-Evaluation 08/25/23    Authorization Type BCBS MCR    Progress Note Due on Visit 18    PT Start Time 0900    PT Stop Time 0940    PT Time Calculation (min) 40 min    Behavior During Therapy [facility] for tasks assessed/performed             Past Medical History:  Diagnosis Date   Allergy    Anxiety    Back pain with radiation 07/13/2012   GERD (gastroesophageal reflux disease)    Glaucoma    Hyperlipidemia    Osteoarthritis    Past Surgical History:  Procedure Laterality Date   BREAST CYST EXCISION Right    COLONOSCOPY WITH PROPOFOL N/A 01/02/2022   Procedure: COLONOSCOPY WITH PROPOFOL;  Surgeon: Dolores Frame, MD;  Location: [facility];  Service: Gastroenterology;  Laterality: N/A;  1130   cyst removed from right breast     LAPAROSCOPIC SALPINGO OOPHERECTOMY Right 04/09/2017   Procedure: LAPAROSCOPIC RIGHT SALPINGO OOPHORECTOMY; RIGHT OVARIAN BENIGN CYSTIC TERATOMA;  Surgeon: Lazaro Arms, MD;  Location: [facility];  Service: Gynecology;  Laterality: Right;   PARTIAL HYSTERECTOMY     TUBAL LIGATION     Patient Active Problem List   Diagnosis Date Noted   Family history of coronary artery disease 07/18/2023   Pre-op evaluation 07/06/2023   Nonspecific abnormal electrocardiogram (ECG) (EKG) 07/06/2023   Intermittent palpitations 07/06/2023   Syncope and collapse 07/06/2023   Sciatica of right side 02/27/2023   Osteoarthritis of hips, bilateral 09/24/2022   Cervicalgia 09/25/2020   Seasonal allergies 02/18/2020   Depression, major, single episode, in partial remission (HCC) 10/29/2019   Essential hypertension 01/01/2018   Prediabetes 03/30/2015   Preop cardiovascular exam 04/08/2014   Right hip  pain 04/06/2014   Hyperlipemia 06/22/2008   GERD (gastroesophageal reflux disease) 05/24/2008   Allergic rhinitis 01/08/2008   OVERACTIVE BLADDER 04/10/2007   GAD (generalized anxiety disorder) 08/22/2006   OSTEOARTHRITIS 08/22/2006    REFERRING PROVIDER:  Huel Cote, MD  REFERRING DIAG: [phone] (ICD-10-CM) - Tear of right acetabular labrum, initial encounter  THERAPY DIAG:  Stiffness of right hip, not elsewhere classified  Pain in right hip  Muscle weakness (generalized)  Rationale for Evaluation and Treatment: Rehabilitation  ONSET DATE: November/December 2023  SUBJECTIVE:   SUBJECTIVE STATEMENT:  Pt reports she is scheduled for surgery 11/7.  She reports that she is a little more sore this morning.   PERTINENT HISTORY: Anxiety Back pain, lumbar spondylosis OA  PAIN:  Are you having pain? Yes NPRS:  5/10 current Location:  R sided groin and distal anterior thigh Description: pinching Worst pain is with sudden turning   PRECAUTIONS: Other: per dx  WEIGHT BEARING RESTRICTIONS: No  FALLS:  Has patient fallen in last 6 months? Yes. Number of falls 1 fall when she fainted  LIVING ENVIRONMENT: Lives with: lives with boyfriend and son Lives in: 1 story home Stairs: 4 steps to enter home with bilat rails, 8 steps on deck with bilat rails Has following equipment at home: None   PLOF: Independent  PATIENT GOALS: decrease pain, to be able to turn without pain, improved sleeping  OBJECTIVE:   DIAGNOSTIC FINDINGS:  (Per Epic) R hip MRI: IMPRESSION: 1. Mild degenerative changes at both hips with a small right hip joint effusion. No acute osseous findings. 2. Mild edema within the right hip adductor muscles, likely muscular strain. 3. 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 4. Lower lumbar spondylosis.  R hip x ray: IMPRESSION: Mild bilateral femoroacetabular osteoarthritis.   (Per MD note) R hip X ray: Very  mild degenerative findings with some ossification of the labrum   R hip MRI: There does appear to be a degenerative anterior superior labral tear with 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  COGNITION: Overall cognitive status: Within functional limits for tasks assessed                         TODAY'S TREATMENT:     PATIENT SURVEYS:  FOTO:  Initial/Current:  58/49 with a goal of 66 at visit 12    LOWER EXTREMITY ROM:   Active ROM Right eval Left eval Right 07/08/23 Left 07/08/23 Right 10/21  Hip flexion 84 102 104 105 92  Hip extension         Hip abduction Pt unable to perform in supine 28 32 33 25  Hip adduction         Hip internal rotation         Hip external rotation 18 26   23   Knee flexion         Knee extension         Ankle dorsiflexion         Ankle plantarflexion         Ankle inversion         Ankle eversion          (Blank rows = not tested)   LOWER EXTREMITY MMT:   MMT Right eval Left eval Right 07/08/23 Left 07/08/23 Right 10/21 Left 10/21  Hip flexion 4/5 5/5 4+* 5 4+/5   Hip extension Tolerated min resistance with min pain 4/5   4-/5 with min pain   Hip abduction 15.7 21.5 ; 5/5 36.9* 38.8 20.5 28.4  Hip adduction          Hip internal rotation      5    Hip external rotation 4+/5 w/n available ROM; painful 5/5 w/n available ROM 4* 5 4-/5 with pain   Knee flexion 4/5 5/5 4* 5 5/5   Knee extension 4+/5 5/5 4* 5 4+/5   Ankle dorsiflexion          Ankle plantarflexion          Ankle inversion          Ankle eversion           (Blank rows = not tested)     FUNCTIONAL TESTS:  07/08/23: 5xSTS 23.69 seconds without UE use, relies LLE>RLE, RLE dynamic knee valgus  08/18/23: 5xSTS 17.08 seconds without UE's  GAIT: Comments: good heel to toe gait, minimally decreased gait speed, decreased BOS.  Pt has a tentative gait pattern.    TODAY'S TREATMENT:     Prior to entry in water: Gait training-  pt instructed  in gait in NWB with RW.  10 feet forward x 2 with cues for sequence. Pivoting to surface for STS in NWB through RLE.  Trial of descending stairs sideways with Lt foot (NWB through RLE) - unable to complete.  Pt seen for aquatic therapy today.  Treatment took place in water 3.5-4.75 ft in depth at the [facility] pool. Temp of water was 91.  Pt entered/exited the pool via stairs alternating step-through pattern with bilat hand rail.   *walking forward, backward without support x 2 *  side stepping arm addct/ abdct with yellow hand floats x 2 lap * monster walk with heavy cues for technique 1 width- stopped due to pain * UE on hand floats:  R/L hip ext to toe touch x 10 each  * step-to pattern 3 steps "up with good/ down with bad", lateral step ups x 3 - with UE on rails * STS at bench in water with blue step under feet, Rt foot forward x 10 with cues for forward reach and hip hinge * return to walking forward/backward and side stepping for recovery  PATIENT EDUCATION:  Education details:  gait training with RW.  Exercise form Person educated: Patient Education method: Explanation, demonstration, verbal cuing Education comprehension: verbalized understanding, returned demonstration, verbal cuing required  HOME EXERCISE PROGRAM: Pt has a land based HEP.  Access Code: [email or web address] URL: [email or web address]  Date: 07/25/2023 - Sit to Stand with Arms Crossed  - 1 x daily - 7 x weekly - 2 sets - 10 reps - Standing Hip Abduction with Counter Support  - 1 x daily - 7 x weekly - 3 sets - 10 reps  07/29/23 - Prone Quadriceps Stretch with Strap  - 1 x daily - 7 x weekly - 5 reps - 20-30 second hold - Prone Hip Extension  - 1 x daily - 7 x weekly - 2 sets - 10 reps  ASSESSMENT:  CLINICAL IMPRESSION:  Instructed pt on NWB with RW, along with performing STS  with NWB through RLE.  Pt would benefit from further instruction in this next session along with stair training with RW. She reported slight spike in pain with monster walk, but otherwise slight reduction of R hip pain to 4/10 during session.  She will benefit for 1 more visits of PT to further improve strength, stability, mobility, and function and to ensure independence with HEP prior to surgery.    OBJECTIVE IMPAIRMENTS: decreased activity tolerance, decreased mobility, decreased ROM, decreased strength, hypomobility, impaired flexibility, and pain.   ACTIVITY LIMITATIONS: lifting, sleeping, and turning  PARTICIPATION LIMITATIONS: meal prep and cleaning  PERSONAL FACTORS: 1 comorbidity: back pain  are also affecting patient's functional outcome.   REHAB POTENTIAL: Good  CLINICAL DECISION MAKING: Stable/uncomplicated  EVALUATION COMPLEXITY: Low   GOALS:  SHORT TERM GOALS: Target date: 7/23/204  Pt will tolerate aquatic therapy without adverse effects for improved pain, strength, function, and tolerance to activity.  Baseline: Goal status:MET 08/06/23  2.  Pt will report at least a 25% improvement in pain and sx's overall.  Baseline:  Goal status: MET 08/06/23  3.  Pt will report reduced pain at night and improved sleeping.  Baseline:  Goal status: MET Target date:  05/27/2023  4.  Pt will demo improved R hip AROM to at least 10-15 deg in abd for improved mobility.   Goal status:  MET Target date:  05/27/2023    LONG TERM GOALS: Target date:POC date  Pt will report she is able to perform her normal community ambulation with good stability without her leg wanting to give way.  Baseline:  Goal status: ONGOING  10/21  2.  Pt will perform 5x STS test in < 15 sec for improved functional LE  strength.  Baseline:  Goal status: PROGRESSING 08/18/23  3.  Pt will report at least a 70% reduction in pain with turning/pivoting with daily activities.  Baseline:  Goal status:    PROGRESSING  10/21  4.  Pt will demo 5/5 strength in R hip flexion and knee ext and 20# with HHD testing for hip abduction for improved tolerance to activity and performance of ADLs/IADLs.  Baseline:  Goal status: 33% MET  10/21  5.  Pt will be independent with HEP for improved pain, strength, ROM, and function.  Baseline:  Goal status:  PROGRESSING  10/21    PLAN:  PT FREQUENCY:  2 more visits.  PT DURATION: 1 week  PLANNED INTERVENTIONS: Therapeutic exercises, Therapeutic activity, Neuromuscular re-education, Balance training, Gait training, Patient/Family education, Self Care, Joint mobilization, Stair training, Aquatic Therapy, Dry Needling, Electrical stimulation, Spinal mobilization, Cryotherapy, Moist heat, Taping, Ultrasound, Manual therapy, and Re-evaluation  PLAN FOR NEXT SESSION:  assess goals, gait/ stair training, d/c.   Mayer Camel, PTA 08/20/23 1:18 PM [facility] [address] [place], [place], [address] Phone: [phone]   Fax:  [phone]

## 2023-08-24 NOTE — Therapy (Signed)
OUTPATIENT PHYSICAL THERAPY TREATMENT NOTE  / DISCHARGE  Patient Name: [name] MRN: [id number] DOB:08/12/1951, 72 y.o., female Today's Date: 08/25/2023   END OF SESSION:  PT End of Session - 08/25/23 0943     Visit Number 17    Number of Visits 17    Date for PT Re-Evaluation 08/25/23    Authorization Type BCBS MCR    PT Start Time 0940    PT Stop Time 1017    PT Time Calculation (min) 37 min    Activity Tolerance Patient tolerated treatment well    Behavior During Therapy [facility] for tasks assessed/performed              Past Medical History:  Diagnosis Date   Allergy    Anxiety    Back pain with radiation 07/13/2012   GERD (gastroesophageal reflux disease)    Glaucoma    Hyperlipidemia    Osteoarthritis    Past Surgical History:  Procedure Laterality Date   BREAST CYST EXCISION Right    COLONOSCOPY WITH PROPOFOL N/A 01/02/2022   Procedure: COLONOSCOPY WITH PROPOFOL;  Surgeon: Dolores Frame, MD;  [facility];  Service: Gastroenterology;  Laterality: N/A;  1130   cyst removed from right breast     LAPAROSCOPIC SALPINGO OOPHERECTOMY Right 04/09/2017   Procedure: LAPAROSCOPIC RIGHT SALPINGO OOPHORECTOMY; RIGHT OVARIAN BENIGN CYSTIC TERATOMA;  Surgeon: Lazaro Arms, MD;  Location: [facility];  Service: Gynecology;  Laterality: Right;   PARTIAL HYSTERECTOMY     TUBAL LIGATION     Patient Active Problem List   Diagnosis Date Noted   Family history of coronary artery disease 07/18/2023   Pre-op evaluation 07/06/2023   Nonspecific abnormal electrocardiogram (ECG) (EKG) 07/06/2023   Intermittent palpitations 07/06/2023   Syncope and collapse 07/06/2023   Sciatica of right side 02/27/2023   Osteoarthritis of hips, bilateral 09/24/2022   Cervicalgia 09/25/2020   Seasonal allergies 02/18/2020   Depression, major, single episode, in partial remission (HCC) 10/29/2019   Essential hypertension 01/01/2018   Prediabetes 03/30/2015   Preop  cardiovascular exam 04/08/2014   Right hip pain 04/06/2014   Hyperlipemia 06/22/2008   GERD (gastroesophageal reflux disease) 05/24/2008   Allergic rhinitis 01/08/2008   OVERACTIVE BLADDER 04/10/2007   GAD (generalized anxiety disorder) 08/22/2006   OSTEOARTHRITIS 08/22/2006    REFERRING PROVIDER:  Huel Cote, MD  REFERRING DIAG: [phone] (ICD-10-CM) - Tear of right acetabular labrum, initial encounter  THERAPY DIAG:  Stiffness of right hip, not elsewhere classified  Pain in right hip  Muscle weakness (generalized)  Rationale for Evaluation and Treatment: Rehabilitation  ONSET DATE: November/December 2023  SUBJECTIVE:   SUBJECTIVE STATEMENT:  Pt is scheduled for surgery 11/7.  She states she is hurting today.  She states she may have done too much this weekend.  She had to go the hospital to visit her boyfriend.  Pt denies any increased pain after prior Rx.  Pt states she practiced gait with walker with therapist last Rx.   PERTINENT HISTORY: Anxiety Back pain, lumbar spondylosis OA  PAIN:  Are you having pain? Yes NPRS:  6/10 current Location:  R sided groin and distal anterior thigh Description: pinching Worst pain is with sudden turning   PRECAUTIONS: Other: per dx  WEIGHT BEARING RESTRICTIONS: No  FALLS:  Has patient fallen in last 6 months? Yes. Number of falls 1 fall when she fainted  LIVING ENVIRONMENT: Lives with: lives with boyfriend and son Lives in: 1 story home Stairs: 4 steps  to enter home with bilat rails, 8 steps on deck with bilat rails Has following equipment at home: None   PLOF: Independent  PATIENT GOALS: decrease pain, to be able to turn without pain, improved sleeping   OBJECTIVE:   DIAGNOSTIC FINDINGS:  (Per Epic) R hip MRI: IMPRESSION: 1. Mild degenerative changes at both hips with a small right hip joint effusion. No acute osseous findings. 2. Mild edema within the right hip adductor muscles, likely  muscular strain. 3. 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 4. Lower lumbar spondylosis.  R hip x ray: IMPRESSION: Mild bilateral femoroacetabular osteoarthritis.   (Per MD note) R hip X ray: Very mild degenerative findings with some ossification of the labrum   R hip MRI: There does appear to be a degenerative anterior superior labral tear with 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  COGNITION: Overall cognitive status: Within functional limits for tasks assessed                         TODAY'S TREATMENT:     PATIENT SURVEYS:  FOTO:  Initial/08/18/23:  58/49 with a goal of 66 at visit 12    LOWER EXTREMITY ROM:   Active ROM Right eval Left eval Right 07/08/23 Left 07/08/23 Right 10/21  Hip flexion 84 102 104 105 92  Hip extension         Hip abduction Pt unable to perform in supine 28 32 33 25  Hip adduction         Hip internal rotation         Hip external rotation 18 26   23   Knee flexion         Knee extension         Ankle dorsiflexion         Ankle plantarflexion         Ankle inversion         Ankle eversion          (Blank rows = not tested)   LOWER EXTREMITY MMT:   MMT Right eval Left eval Right 07/08/23 Left 07/08/23 Right 10/21 Left 10/21  Hip flexion 4/5 5/5 4+* 5 4+/5   Hip extension Tolerated min resistance with min pain 4/5   4-/5 with min pain   Hip abduction 15.7 21.5 ; 5/5 36.9* 38.8 20.5 28.4  Hip adduction          Hip internal rotation      5    Hip external rotation 4+/5 w/n available ROM; painful 5/5 w/n available ROM 4* 5 4-/5 with pain   Knee flexion 4/5 5/5 4* 5 5/5   Knee extension 4+/5 5/5 4* 5 4+/5   Ankle dorsiflexion          Ankle plantarflexion          Ankle inversion          Ankle eversion           (Blank rows = not tested)     FUNCTIONAL TESTS:  07/08/23: 5xSTS 23.69 seconds without UE use, relies LLE>RLE, RLE dynamic knee  valgus  08/18/23: 5xSTS 17.08 seconds without UE's     TODAY'S TREATMENT:      PT spent time answering pt's questions concerning upcoming surgery.  PT educated pt with post op expectations, precautions, restrictions, Wb'ing, and gait.   Reviewed HEP Bridge 3x10 Attempted S/L clams though stopped due to  pain Attempted prone hip extension though stopped due to difficulty and pain.  Supine clams with RTB 3x10 Standing hip abd 2x10 bilat   See below for pt education  PATIENT EDUCATION:  Education details:  post op expectations, precautions, and restrictions.  gait training with Wb'ing restrictions.  Exercise form, POC, and HEP.  PT answered pt's questions.  Person educated: Patient Education method: Explanation, demonstration, verbal cuing Education comprehension: verbalized understanding, returned demonstration, verbal cuing required  HOME EXERCISE PROGRAM: Pt has a land based HEP.  Access Code: [email or web address] URL: [email or web address]  Date: 07/25/2023 - Sit to Stand with Arms Crossed  - 1 x daily - 7 x weekly - 2 sets - 10 reps - Standing Hip Abduction with Counter Support  - 1 x daily - 7 x weekly - 3 sets - 10 reps  07/29/23 - Prone Quadriceps Stretch with Strap  - 1 x daily - 7 x weekly - 5 reps - 20-30 second hold - Prone Hip Extension  - 1 x daily - 7 x weekly - 2 sets - 10 reps  ASSESSMENT:  CLINICAL IMPRESSION: Pt continues to have pain everyday and c/o's of leg giving way with ambulation.  Pt presents to Rx stating she is hurting.  Pt states she "Did too much over the weekend".  She is slower and more tentative with gait today.  PT reviewed HEP and instructed pt in correct exercises and correct form with exercises.  PT printed off an exercise handout and went through her current home program with her.  She had much difficulty and pain with prone hip extension.  PT instructed pt to not perform at home.  Pt also unable to perform S/L clams due to pain.  Pt  demonstrates good understanding of HEP.  Pt is having hip surgery on 09/04/2023.  PT spent time answering questions and educating pt concerning post op expectations, limitations, and restrictions.  Pt has made good progress toward goals.  She met all STG's and LTG #5.  Pt partially met LTG's #2,4.    OBJECTIVE IMPAIRMENTS: decreased activity tolerance, decreased mobility, decreased ROM, decreased strength, hypomobility, impaired flexibility, and pain.   ACTIVITY LIMITATIONS: lifting, sleeping, and turning  PARTICIPATION LIMITATIONS: meal prep and cleaning  PERSONAL FACTORS: 1 comorbidity: back pain  are also affecting patient's functional outcome.   REHAB POTENTIAL: Good  CLINICAL DECISION MAKING: Stable/uncomplicated  EVALUATION COMPLEXITY: Low   GOALS:  SHORT TERM GOALS: Target date: 7/23/204  Pt will tolerate aquatic therapy without adverse effects for improved pain, strength, function, and tolerance to activity.  Baseline: Goal status:MET 08/06/23  2.  Pt will report at least a 25% improvement in pain and sx's overall.  Baseline:  Goal status: MET 08/06/23  3.  Pt will report reduced pain at night and improved sleeping.  Baseline:  Goal status: MET Target date:  05/27/2023  4.  Pt will demo improved R hip AROM to at least 10-15 deg in abd for improved mobility.   Goal status:  MET Target date:  05/27/2023    LONG TERM GOALS: Target date:POC date  Pt will report she is able to perform her normal community ambulation with good stability without her leg wanting to give way.  Baseline:  Goal status: NOT MET 10/28  2.  Pt will perform 5x STS test in < 15 sec for improved functional LE strength.  Baseline:  Goal status:  PARTIALLY MET 08/25/23  3.  Pt will report at least a 70% reduction in pain  with turning/pivoting with daily activities.  Baseline:  Goal status:   IMPROVED, BUT NOT MET 10/28  4.  Pt will demo 5/5 strength in R hip flexion and knee ext and 20# with  HHD testing for hip abduction for improved tolerance to activity and performance of ADLs/IADLs.  Baseline:  Goal status: 33% MET  10/21  5.  Pt will be independent with HEP for improved pain, strength, ROM, and function.  Baseline:  Goal status:  GOAL MET 10/28    PLAN:    PLANNED INTERVENTIONS: Therapeutic exercises, Therapeutic activity, Neuromuscular re-education, Balance training, Gait training, Patient/Family education, Self Care, Joint mobilization, Stair training, Aquatic Therapy, Dry Needling, Electrical stimulation, Spinal mobilization, Cryotherapy, Moist heat, Taping, Ultrasound, Manual therapy, and Re-evaluation  PLAN FOR NEXT SESSION:  Pt to be discharged from skilled PT services due to having surgery in November.  Pt has a HEP and is agreeable with discharge.  Pt will return to PT after surgery.   PHYSICAL THERAPY DISCHARGE SUMMARY  Visits from Start of Care: 17  Current functional level related to goals / functional outcomes: See above   Remaining deficits: See above   Education / Equipment: HEP   Audie Clear III PT, DPT 08/26/23 1:58 PM

## 2023-08-25 ENCOUNTER — Ambulatory Visit (HOSPITAL_BASED_OUTPATIENT_CLINIC_OR_DEPARTMENT_OTHER): Payer: Medicare Other | Admitting: Physical Therapy

## 2023-08-25 ENCOUNTER — Encounter (HOSPITAL_BASED_OUTPATIENT_CLINIC_OR_DEPARTMENT_OTHER): Payer: Self-pay | Admitting: Physical Therapy

## 2023-08-25 DIAGNOSIS — M25651 Stiffness of right hip, not elsewhere classified: Secondary | ICD-10-CM | POA: Diagnosis not present

## 2023-08-25 DIAGNOSIS — M6281 Muscle weakness (generalized): Secondary | ICD-10-CM | POA: Diagnosis not present

## 2023-08-25 DIAGNOSIS — M25551 Pain in right hip: Secondary | ICD-10-CM

## 2023-08-27 ENCOUNTER — Ambulatory Visit (HOSPITAL_BASED_OUTPATIENT_CLINIC_OR_DEPARTMENT_OTHER): Payer: Medicare Other | Admitting: Physical Therapy

## 2023-08-27 NOTE — Progress Notes (Signed)
Surgical Instructions   Your procedure is scheduled on Thursday, 09/04/23. Report to [facility] Main Entrance "A" at 12:52 P.M., then check in with the Admitting office. Any questions or running late day of surgery: call [phone]  Questions prior to your surgery date: call [phone], Monday-Friday, 8am-4pm. If you experience any cold or flu symptoms such as cough, fever, chills, shortness of breath, etc. between now and your scheduled surgery, please notify us at the above number.     Remember:  Do not eat after midnight the night before your surgery  You may drink clear liquids until 11:52am the morning of your surgery.   Clear liquids allowed are: Water, Non-Citrus Juices (without pulp), Carbonated Beverages, Clear Tea, Black Coffee Only (NO MILK, CREAM OR POWDERED CREAMER of any kind), and Gatorade.  Patient Instructions  The night before surgery:  No food after midnight. ONLY clear liquids after midnight  The day of surgery (if you do NOT have diabetes):  Drink ONE (1) Pre-Surgery Clear Ensure by 11:50am the morning of surgery. Drink in one sitting. Do not sip.  This drink was given to you during your hospital  pre-op appointment visit. Nothing else to drink after completing the  Pre-Surgery Clear Ensure.           If you have questions, please contact your surgeon's office.     Take these medicines the morning of surgery with A SIP OF WATER  amLODipine (NORVASC)  citalopram (CELEXA)  fluticasone (FLONASE)  oxybutynin (DITROPAN)  pantoprazole (PROTONIX)  rosuvastatin (CRESTOR)   May take these medicines IF NEEDED: loratadine (ALLERGY RELIEF)    One week prior to surgery, STOP taking any Aspirin (unless otherwise instructed by your surgeon) Aleve, Naproxen, Ibuprofen, Motrin, Advil, Goody's, BC's, all herbal medications, fish oil, and non-prescription vitamins.                     Do NOT Smoke (Tobacco/Vaping) for 24 hours prior to your procedure.  If you  use a CPAP at night, you may bring your mask/headgear for your overnight stay.   You will be asked to remove any contacts, glasses, piercing's, hearing aid's, dentures/partials prior to surgery. Please bring cases for these items if needed.    Patients discharged the day of surgery will not be allowed to drive home, and someone needs to stay with them for 24 hours.  SURGICAL WAITING ROOM VISITATION Patients may have no more than 2 support people in the waiting area - these visitors may rotate.   Pre-op nurse will coordinate an appropriate time for 1 ADULT support person, who may not rotate, to accompany patient in pre-op.  Children under the age of 41 must have an adult with them who is not the patient and must remain in the main waiting area with an adult.  If the patient needs to stay at the hospital during part of their recovery, the visitor guidelines for inpatient rooms apply.  Please refer to the [facility] website for the visitor guidelines for any additional information.   If you received a COVID test during your pre-op visit  it is requested that you wear a mask when out in public, stay away from anyone that may not be feeling well and notify your surgeon if you develop symptoms. If you have been in contact with anyone that has tested positive in the last 10 days please notify you surgeon.      Pre-operative CHG Bathing Instructions   You can play  a key role in reducing the risk of infection after surgery. Your skin needs to be as free of germs as possible. You can reduce the number of germs on your skin by washing with CHG (chlorhexidine gluconate) soap before surgery. CHG is an antiseptic soap that kills germs and continues to kill germs even after washing.   DO NOT use if you have an allergy to chlorhexidine/CHG or antibacterial soaps. If your skin becomes reddened or irritated, stop using the CHG and notify one of our RNs at [phone].              TAKE A SHOWER THE NIGHT  BEFORE SURGERY AND THE DAY OF SURGERY    Please keep in mind the following:  DO NOT shave, including legs and underarms, 48 hours prior to surgery.   You may shave your face before/day of surgery.  Place clean sheets on your bed the night before surgery Use a clean washcloth (not used since being washed) for each shower. DO NOT sleep with pet's night before surgery.  CHG Shower Instructions:  Wash your face and private area with normal soap. If you choose to wash your hair, wash first with your normal shampoo.  After you use shampoo/soap, rinse your hair and body thoroughly to remove shampoo/soap residue.  Turn the water OFF and apply half the bottle of CHG soap to a CLEAN washcloth.  Apply CHG soap ONLY FROM YOUR NECK DOWN TO YOUR TOES (washing for 3-5 minutes)  DO NOT use CHG soap on face, private areas, open wounds, or sores.  Pay special attention to the area where your surgery is being performed.  If you are having back surgery, having someone wash your back for you may be helpful. Wait 2 minutes after CHG soap is applied, then you may rinse off the CHG soap.  Pat dry with a clean towel  Put on clean pajamas    Additional instructions for the day of surgery: DO NOT APPLY any lotions, deodorants, cologne, or perfumes.   Do not wear jewelry or makeup Do not wear nail polish, gel polish, artificial nails, or any other type of covering on natural nails (fingers and toes) Do not bring valuables to the hospital. [facility] is not responsible for valuables/personal belongings. Put on clean/comfortable clothes.  Please brush your teeth.  Ask your nurse before applying any prescription medications to the skin.

## 2023-08-28 ENCOUNTER — Encounter (HOSPITAL_COMMUNITY): Payer: Self-pay

## 2023-08-28 ENCOUNTER — Encounter (HOSPITAL_COMMUNITY)
Admission: RE | Admit: 2023-08-28 | Discharge: 2023-08-28 | Disposition: A | Discharge status: Home / Self Care | Payer: Medicare Other | Source: Ambulatory Visit | Attending: Orthopaedic Surgery | Admitting: Orthopaedic Surgery

## 2023-08-28 ENCOUNTER — Other Ambulatory Visit: Payer: Self-pay

## 2023-08-28 VITALS — BP 108/86 | HR 78 | Temp 98.5°F | Resp 18 | Ht 62.0 in | Wt 161.3 lb

## 2023-08-28 DIAGNOSIS — Z01812 Encounter for preprocedural laboratory examination: Secondary | ICD-10-CM | POA: Insufficient documentation

## 2023-08-28 DIAGNOSIS — Z01818 Encounter for other preprocedural examination: Secondary | ICD-10-CM

## 2023-08-28 HISTORY — DX: Essential (primary) hypertension: I10

## 2023-08-28 HISTORY — DX: Other specified postprocedural states: R11.2

## 2023-08-28 HISTORY — DX: Other specified postprocedural states: Z98.890

## 2023-08-28 LAB — CBC
HCT: 34.2 % — ABNORMAL LOW (ref 36.0–46.0)
Hemoglobin: 11.3 g/dL — ABNORMAL LOW (ref 12.0–15.0)
MCH: 30.3 pg (ref 26.0–34.0)
MCHC: 33 g/dL (ref 30.0–36.0)
MCV: 91.7 fL (ref 80.0–100.0)
Platelets: 286 10*3/uL (ref 150–400)
RBC: 3.73 MIL/uL — ABNORMAL LOW (ref 3.87–5.11)
RDW: 13.4 % (ref 11.5–15.5)
WBC: 6.6 10*3/uL (ref 4.0–10.5)
nRBC: 0 % (ref 0.0–0.2)

## 2023-08-28 NOTE — Progress Notes (Signed)
PCP - margaret simpson Cardiologist - Mallipeddi  PPM/ICD - denies   Chest x-ray - n/a EKG - 06/26/23 Stress Test - denies ECHO - 08/01/23 Cardiac Cath - denies  Sleep Study - denies  ERAS Protcol -yes PRE-SURGERY Ensure or G2- ensure ordered and give  COVID TEST- not needed   Anesthesia review: yes, cardiology clearance in epic  Patient denies shortness of breath, fever, cough and chest pain at PAT appointment   All instructions explained to the patient, with a verbal understanding of the material. Patient agrees to go over the instructions while at home for a better understanding. Patient also instructed to self quarantine after being tested for COVID-19. The opportunity to ask questions was provided.

## 2023-08-29 ENCOUNTER — Telehealth: Payer: Self-pay | Admitting: Family Medicine

## 2023-08-29 ENCOUNTER — Other Ambulatory Visit: Payer: Self-pay

## 2023-08-29 DIAGNOSIS — F4323 Adjustment disorder with mixed anxiety and depressed mood: Secondary | ICD-10-CM

## 2023-08-29 MED ORDER — CITALOPRAM HYDROBROMIDE 40 MG PO TABS
40.0000 mg | ORAL_TABLET | Freq: Every day | ORAL | 0 refills | Status: DC
Start: 2023-08-29 — End: 2023-10-13

## 2023-08-29 NOTE — Telephone Encounter (Signed)
Prescription Request  08/29/2023  LOV: 06/25/2023  What is the name of the medication or equipment? citalopram (CELEXA) 40 MG tablet [[id number]]    Have you contacted your pharmacy to request a refill? No   Which pharmacy would you like this sent to?   [facility] - [place], [place] - [address] [place] [address] Phone: [phone] Fax: [phone]    Patient notified that their request is being sent to the clinical staff for review and that they should receive a response within 2 business days.   Please advise at Mobile [phone] (mobile)

## 2023-08-29 NOTE — Telephone Encounter (Signed)
Refills sent

## 2023-09-01 ENCOUNTER — Ambulatory Visit (HOSPITAL_BASED_OUTPATIENT_CLINIC_OR_DEPARTMENT_OTHER): Payer: Medicare Other | Admitting: Physical Therapy

## 2023-09-02 ENCOUNTER — Telehealth: Payer: Self-pay | Admitting: Family Medicine

## 2023-09-02 NOTE — Telephone Encounter (Signed)
Placard  Noted Copied Sleeved  Call patient when ready. Needs asap due to surgery and asked to extend to first of 2025 year

## 2023-09-03 ENCOUNTER — Ambulatory Visit (HOSPITAL_BASED_OUTPATIENT_CLINIC_OR_DEPARTMENT_OTHER): Payer: Medicare Other | Admitting: Physical Therapy

## 2023-09-04 ENCOUNTER — Other Ambulatory Visit (HOSPITAL_COMMUNITY): Payer: Self-pay

## 2023-09-04 ENCOUNTER — Other Ambulatory Visit: Payer: Self-pay

## 2023-09-04 ENCOUNTER — Ambulatory Visit (HOSPITAL_BASED_OUTPATIENT_CLINIC_OR_DEPARTMENT_OTHER): Payer: Medicare Other | Admitting: Anesthesiology

## 2023-09-04 ENCOUNTER — Encounter (HOSPITAL_COMMUNITY): Payer: Self-pay | Admitting: Orthopaedic Surgery

## 2023-09-04 ENCOUNTER — Encounter (HOSPITAL_COMMUNITY)
Admission: RE | Disposition: A | Discharge status: Home / Self Care | Payer: Self-pay | Source: Home / Self Care | Attending: Orthopaedic Surgery

## 2023-09-04 ENCOUNTER — Ambulatory Visit (HOSPITAL_COMMUNITY): Payer: Self-pay | Admitting: Physician Assistant

## 2023-09-04 ENCOUNTER — Ambulatory Visit (HOSPITAL_COMMUNITY)
Admission: RE | Admit: 2023-09-04 | Discharge: 2023-09-04 | Disposition: A | Discharge status: Home / Self Care | Payer: Medicare Other | Attending: Orthopaedic Surgery | Admitting: Orthopaedic Surgery

## 2023-09-04 ENCOUNTER — Ambulatory Visit (HOSPITAL_COMMUNITY): Payer: Medicare Other

## 2023-09-04 DIAGNOSIS — S73191A Other sprain of right hip, initial encounter: Secondary | ICD-10-CM

## 2023-09-04 DIAGNOSIS — M1990 Unspecified osteoarthritis, unspecified site: Secondary | ICD-10-CM | POA: Insufficient documentation

## 2023-09-04 DIAGNOSIS — X58XXXA Exposure to other specified factors, initial encounter: Secondary | ICD-10-CM | POA: Insufficient documentation

## 2023-09-04 DIAGNOSIS — Z791 Long term (current) use of non-steroidal anti-inflammatories (NSAID): Secondary | ICD-10-CM | POA: Insufficient documentation

## 2023-09-04 DIAGNOSIS — Z79899 Other long term (current) drug therapy: Secondary | ICD-10-CM | POA: Diagnosis not present

## 2023-09-04 DIAGNOSIS — F32A Depression, unspecified: Secondary | ICD-10-CM | POA: Diagnosis not present

## 2023-09-04 DIAGNOSIS — I358 Other nonrheumatic aortic valve disorders: Secondary | ICD-10-CM | POA: Insufficient documentation

## 2023-09-04 DIAGNOSIS — S73004A Unspecified dislocation of right hip, initial encounter: Secondary | ICD-10-CM | POA: Diagnosis not present

## 2023-09-04 DIAGNOSIS — I10 Essential (primary) hypertension: Secondary | ICD-10-CM | POA: Diagnosis not present

## 2023-09-04 DIAGNOSIS — G709 Myoneural disorder, unspecified: Secondary | ICD-10-CM | POA: Diagnosis not present

## 2023-09-04 DIAGNOSIS — K219 Gastro-esophageal reflux disease without esophagitis: Secondary | ICD-10-CM | POA: Insufficient documentation

## 2023-09-04 SURGERY — ARTHROSCOPY, HIP, WITH LABRUM REPAIR
Anesthesia: General | Site: Hip | Laterality: Right

## 2023-09-04 MED ORDER — BUPIVACAINE-EPINEPHRINE (PF) 0.25% -1:200000 IJ SOLN
INTRAMUSCULAR | Status: AC
  Filled 2023-09-04: qty 30

## 2023-09-04 MED ORDER — LIDOCAINE 2% (20 MG/ML) 5 ML SYRINGE
INTRAMUSCULAR | Status: DC | PRN
  Administered 2023-09-04: 40 mg via INTRAVENOUS

## 2023-09-04 MED ORDER — FENTANYL CITRATE (PF) 250 MCG/5ML IJ SOLN
INTRAMUSCULAR | Status: DC | PRN
  Administered 2023-09-04: 50 ug via INTRAVENOUS

## 2023-09-04 MED ORDER — LACTATED RINGERS IV SOLN: INTRAVENOUS | Status: DC | PRN

## 2023-09-04 MED ORDER — CHLORHEXIDINE GLUCONATE 0.12 % MT SOLN
15.0000 mL | Freq: Once | OROMUCOSAL | Status: AC
  Administered 2023-09-04: 15 mL via OROMUCOSAL
  Filled 2023-09-04: qty 15

## 2023-09-04 MED ORDER — EPINEPHRINE PF 1 MG/ML IJ SOLN
INTRAMUSCULAR | Status: AC
  Filled 2023-09-04: qty 2

## 2023-09-04 MED ORDER — LACTATED RINGERS IV SOLN
INTRAVENOUS | Status: DC
Start: 2023-09-04 — End: 2023-09-05

## 2023-09-04 MED ORDER — FENTANYL CITRATE (PF) 250 MCG/5ML IJ SOLN
INTRAMUSCULAR | Status: AC
  Filled 2023-09-04: qty 5

## 2023-09-04 MED ORDER — PROPOFOL 10 MG/ML IV BOLUS
INTRAVENOUS | Status: AC
  Filled 2023-09-04: qty 20

## 2023-09-04 MED ORDER — OXYCODONE HCL 5 MG PO TABS: 5.0000 mg | ORAL_TABLET | Freq: Once | ORAL | Status: DC | PRN

## 2023-09-04 MED ORDER — ACETAMINOPHEN 160 MG/5ML PO SOLN: 1000.0000 mg | Freq: Once | ORAL | Status: DC | PRN

## 2023-09-04 MED ORDER — BUPIVACAINE-EPINEPHRINE 0.25% -1:200000 IJ SOLN
INTRAMUSCULAR | Status: DC | PRN
  Administered 2023-09-04: 10 mL

## 2023-09-04 MED ORDER — CEFAZOLIN SODIUM-DEXTROSE 2-4 GM/100ML-% IV SOLN
2.0000 g | INTRAVENOUS | Status: AC
Start: 2023-09-04 — End: 2023-09-04
  Administered 2023-09-04: 2 g via INTRAVENOUS
  Filled 2023-09-04: qty 100

## 2023-09-04 MED ORDER — LIDOCAINE 2% (20 MG/ML) 5 ML SYRINGE
INTRAMUSCULAR | Status: AC
  Filled 2023-09-04: qty 5

## 2023-09-04 MED ORDER — ACETAMINOPHEN 500 MG PO TABS
500.0000 mg | ORAL_TABLET | Freq: Three times a day (TID) | ORAL | 0 refills | Status: DC
  Filled 2023-09-04: qty 30, 10d supply, fill #0

## 2023-09-04 MED ORDER — HYDROMORPHONE HCL 1 MG/ML IJ SOLN
0.2500 mg | INTRAMUSCULAR | Status: DC | PRN
  Administered 2023-09-04 (×2): 0.5 mg via INTRAVENOUS

## 2023-09-04 MED ORDER — SODIUM CHLORIDE 0.9 % IR SOLN
Status: DC | PRN
  Administered 2023-09-04: 3001 mL

## 2023-09-04 MED ORDER — GABAPENTIN 300 MG PO CAPS
300.0000 mg | ORAL_CAPSULE | Freq: Once | ORAL | Status: AC
  Administered 2023-09-04: 300 mg via ORAL
  Filled 2023-09-04: qty 1

## 2023-09-04 MED ORDER — TRANEXAMIC ACID 1000 MG/10ML IV SOLN
INTRAVENOUS | Status: DC | PRN
  Administered 2023-09-04: 1000 mg via INTRAVENOUS

## 2023-09-04 MED ORDER — ACETAMINOPHEN 500 MG PO TABS
1000.0000 mg | ORAL_TABLET | Freq: Once | ORAL | Status: AC
  Administered 2023-09-04: 1000 mg via ORAL
  Filled 2023-09-04: qty 2

## 2023-09-04 MED ORDER — OXYCODONE HCL 5 MG PO TABS: 5.0000 mg | ORAL_TABLET | ORAL | 0 refills | Status: DC | PRN

## 2023-09-04 MED ORDER — HYDROMORPHONE HCL 1 MG/ML IJ SOLN
INTRAMUSCULAR | Status: AC
  Filled 2023-09-04: qty 1

## 2023-09-04 MED ORDER — PROPOFOL 10 MG/ML IV BOLUS
INTRAVENOUS | Status: DC | PRN
  Administered 2023-09-04: 150 mg via INTRAVENOUS

## 2023-09-04 MED ORDER — ACETAMINOPHEN 500 MG PO TABS: 1000.0000 mg | ORAL_TABLET | Freq: Once | ORAL | Status: DC | PRN

## 2023-09-04 MED ORDER — DEXAMETHASONE SODIUM PHOSPHATE 10 MG/ML IJ SOLN
INTRAMUSCULAR | Status: DC | PRN
  Administered 2023-09-04: 4 mg via INTRAVENOUS

## 2023-09-04 MED ORDER — MIDAZOLAM HCL 2 MG/2ML IJ SOLN
INTRAMUSCULAR | Status: AC
  Filled 2023-09-04: qty 2

## 2023-09-04 MED ORDER — ONDANSETRON HCL 4 MG/2ML IJ SOLN
INTRAMUSCULAR | Status: DC | PRN
  Administered 2023-09-04: 4 mg via INTRAVENOUS

## 2023-09-04 MED ORDER — ACETAMINOPHEN 500 MG PO TABS: 500.0000 mg | ORAL_TABLET | Freq: Three times a day (TID) | ORAL | 0 refills | Status: AC

## 2023-09-04 MED ORDER — OXYCODONE HCL 5 MG PO TABS
5.0000 mg | ORAL_TABLET | ORAL | 0 refills | Status: DC | PRN
  Filled 2023-09-04: qty 30, 5d supply, fill #0

## 2023-09-04 MED ORDER — ASPIRIN 325 MG PO TBEC: 325.0000 mg | DELAYED_RELEASE_TABLET | Freq: Every day | ORAL | 0 refills | Status: DC

## 2023-09-04 MED ORDER — OXYCODONE HCL 5 MG/5ML PO SOLN: 5.0000 mg | Freq: Once | ORAL | Status: DC | PRN

## 2023-09-04 MED ORDER — ASPIRIN 325 MG PO TBEC
325.0000 mg | DELAYED_RELEASE_TABLET | Freq: Every day | ORAL | 0 refills | Status: DC
  Filled 2023-09-04: qty 14, 14d supply, fill #0

## 2023-09-04 MED ORDER — FENTANYL CITRATE (PF) 100 MCG/2ML IJ SOLN
25.0000 ug | INTRAMUSCULAR | Status: DC | PRN
  Administered 2023-09-04 (×2): 50 ug via INTRAVENOUS

## 2023-09-04 MED ORDER — TRANEXAMIC ACID-NACL 1000-0.7 MG/100ML-% IV SOLN
1000.0000 mg | INTRAVENOUS | Status: DC
  Filled 2023-09-04: qty 100

## 2023-09-04 MED ORDER — FENTANYL CITRATE (PF) 100 MCG/2ML IJ SOLN
INTRAMUSCULAR | Status: AC
  Filled 2023-09-04: qty 2

## 2023-09-04 MED ORDER — ROCURONIUM BROMIDE 10 MG/ML (PF) SYRINGE
PREFILLED_SYRINGE | INTRAVENOUS | Status: AC
Start: 2023-09-04 — End: ?
  Filled 2023-09-04: qty 10

## 2023-09-04 MED ORDER — ORAL CARE MOUTH RINSE: 15.0000 mL | Freq: Once | OROMUCOSAL | Status: AC

## 2023-09-04 MED ORDER — MIDAZOLAM HCL 2 MG/2ML IJ SOLN: 1.0000 mg | Freq: Once | INTRAMUSCULAR | Status: DC

## 2023-09-04 MED ORDER — ACETAMINOPHEN 10 MG/ML IV SOLN: 1000.0000 mg | Freq: Once | INTRAVENOUS | Status: DC | PRN

## 2023-09-04 MED ORDER — MIDAZOLAM HCL 2 MG/2ML IJ SOLN
INTRAMUSCULAR | Status: DC | PRN
  Administered 2023-09-04: 2 mg via INTRAVENOUS

## 2023-09-04 SURGICAL SUPPLY — 55 items
ANCH SUT .5 CRC TPR CT 40X40 (SUTURE) ×1
APL PRP STRL LF DISP 70% ISPRP (MISCELLANEOUS) ×1
BAG COUNTER SPONGE SURGICOUNT (BAG) IMPLANT
BAG SPNG CNTER NS LX DISP (BAG)
BLADE SAMURAI STR FULL RADIUS (BLADE) IMPLANT
BLADE SURG 11 STRL SS (BLADE) ×2 IMPLANT
BUR ROUND HI FLUTE 8 4X19 (BURR) ×2 IMPLANT
BURR ROUND HI FLUTE 8 4X19 (BURR)
CANNULA OBTURATOR FLOWPORT ST5 (CANNULA) IMPLANT
CHLORAPREP W/TINT 26 (MISCELLANEOUS) ×2 IMPLANT
COOLER ICEMAN CLASSIC (MISCELLANEOUS) IMPLANT
DISSECTOR 4.2MMX19CM HL (MISCELLANEOUS) ×2 IMPLANT
DRAPE C-ARM 42X72 X-RAY (DRAPES) ×2 IMPLANT
DRAPE STERI IOBAN 125X83 (DRAPES) ×2 IMPLANT
DRAPE U-SHAPE 47X51 STRL (DRAPES) ×4 IMPLANT
DRSG TEGADERM 4X4.75 (GAUZE/BANDAGES/DRESSINGS) ×4 IMPLANT
GAUZE SPONGE 4X4 12PLY STRL (GAUZE/BANDAGES/DRESSINGS) ×2 IMPLANT
GLOVE BIO SURGEON STRL SZ7.5 (GLOVE) ×2 IMPLANT
GLOVE BIOGEL PI IND STRL 6.5 (GLOVE) ×2 IMPLANT
GLOVE BIOGEL PI IND STRL 8 (GLOVE) ×2 IMPLANT
GLOVE ECLIPSE 6.0 STRL STRAW (GLOVE) ×2 IMPLANT
GOWN STRL REUS W/ TWL LRG LVL3 (GOWN DISPOSABLE) ×4 IMPLANT
GOWN STRL REUS W/ TWL XL LVL3 (GOWN DISPOSABLE) ×2 IMPLANT
GOWN STRL REUS W/TWL LRG LVL3 (GOWN DISPOSABLE) ×2
GOWN STRL REUS W/TWL XL LVL3 (GOWN DISPOSABLE) ×1
KIT BASIN OR (CUSTOM PROCEDURE TRAY) ×2 IMPLANT
KIT HIP ARTHROSCOPY (ORTHOPEDIC DISPOSABLE SUPPLIES) ×2 IMPLANT
KIT PATIENT POSITION MEDIUM (KITS) IMPLANT
KIT PORTAL ENTRY HIP ACCESS (KITS) IMPLANT
KIT TURNOVER KIT B (KITS) ×2 IMPLANT
MANIFOLD NEPTUNE II (INSTRUMENTS) ×4 IMPLANT
NDL INJECTOR II CARTRIDGE (MISCELLANEOUS) IMPLANT
NDL SPNL 18GX3.5 QUINCKE PK (NEEDLE) ×2 IMPLANT
NEEDLE INJECTOR II CARTRIDGE (MISCELLANEOUS) ×1
NEEDLE SPNL 18GX3.5 QUINCKE PK (NEEDLE) ×1
PACK BASIC III (CUSTOM PROCEDURE TRAY) ×1
PACK SRG BSC III STRL LF ECLPS (CUSTOM PROCEDURE TRAY) ×2 IMPLANT
PAD ARMBOARD 7.5X6 YLW CONV (MISCELLANEOUS) ×4 IMPLANT
PAD COLD SHLDR WRAP-ON (PAD) IMPLANT
PASSER SUT 1.5D CRESCENT (INSTRUMENTS) IMPLANT
PASSER SUT 70D UP ANGLED (INSTRUMENTS) IMPLANT
SPIKE FLUID TRANSFER (MISCELLANEOUS) ×2 IMPLANT
SPONGE T-LAP 18X18 ~~LOC~~+RFID (SPONGE) ×2 IMPLANT
SUT ETHILON 3 0 PS 1 (SUTURE) IMPLANT
SUT FIBERWIRE #2 38 T-5 BLUE (SUTURE) ×1
SUT XBRAID 1.4 BLUE (SUTURE) IMPLANT
SUTURE FIBERWR #2 38 T-5 BLUE (SUTURE) IMPLANT
SUTURE TAPE XBRAID 1.2 BLUE 45 (SUTURE) IMPLANT
SUTURETAPE XBRAID 1.2 BLUE 45 (SUTURE) ×1
SYR 50ML LL SCALE MARK (SYRINGE) ×2 IMPLANT
TOWEL GREEN STERILE (TOWEL DISPOSABLE) ×2 IMPLANT
TUBE CONNECTING 12X1/4 (SUCTIONS) ×4 IMPLANT
TUBING ARTHROSCOPY IRRIG 16FT (MISCELLANEOUS) ×2 IMPLANT
WAND APOLLO RF 50D ABLATOR (BUR) IMPLANT
WATER STERILE IRR 1000ML POUR (IV SOLUTION) ×2 IMPLANT

## 2023-09-04 NOTE — H&P (Signed)
Chief Complaint: Right hip pain        History of Present Illness:    06/16/2023: Presents today for follow-up.  Recently been seen by Dr. Magnus Ivan who does not believe that she would be a candidate for hip arthroplasty.  She is here today for further discussion of the right hip.   [name] is a 72 y.o. female with right hip joint which has been going on for the last 3 to 4 months.  She is here today for further discussion.  She has been seeing Dr. Thane Edu this for this and performed an injection of the right hip.  She states that she got approximately 1 relief of good relief from this.  She has been experiencing swelling in the right lower extremity which does correlate somewhat with activity level.  She is continuing to be active and she is taking Mobic to relieve her pain.  This does help somewhat.  She has worse pain at the end of the day.       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   GERD (gastroesophageal reflux disease)     Glaucoma     Hyperlipidemia     Osteoarthritis               Past Surgical History:  Procedure Laterality Date   BREAST CYST EXCISION Right     COLONOSCOPY WITH PROPOFOL N/A 01/02/2022    Procedure: COLONOSCOPY WITH PROPOFOL;  Surgeon: Dolores Frame, MD;  Location: [facility];  Service: Gastroenterology;  Laterality: N/A;  1130   cyst removed from right breast       LAPAROSCOPIC SALPINGO OOPHERECTOMY Right 04/09/2017    Procedure: LAPAROSCOPIC RIGHT SALPINGO OOPHORECTOMY; RIGHT OVARIAN BENIGN CYSTIC TERATOMA;  Surgeon: Lazaro Arms, MD;  Location: [facility] ORS;  Service: Gynecology;  Laterality: Right;   PARTIAL HYSTERECTOMY       TUBAL LIGATION            Social History         Socioeconomic History   Marital status: Single      Spouse name:  Not on file   Number of children: 3   Years of education: 12   Highest education level: 12th grade  Occupational History   Not on file  Tobacco Use   Smoking status: Never   Smokeless tobacco: Never  Vaping Use   Vaping status: Never Used  Substance and Sexual Activity   Alcohol use: Not Currently      Comment: occasionally wine   Drug use: No   Sexual activity: Not Currently      Birth control/protection: Surgical      Comment: hyst  Other Topics Concern   Not on file  Social History Narrative   Not on file    Social Determinants of Health        Financial Resource Strain: Low Risk  (01/17/2023)    Overall Financial Resource Strain (CARDIA)     Difficulty of Paying Living Expenses: Not hard at all  Food Insecurity: No Food Insecurity (01/17/2023)    Hunger Vital Sign  Worried About Programme researcher, broadcasting/film/video in the Last Year: Never true     Ran Out of Food in the Last Year: Never true  Transportation Needs: No Transportation Needs (01/17/2023)    PRAPARE - Therapist, art (Medical): No     Lack of Transportation (Non-Medical): No  Physical Activity: Sufficiently Active (01/17/2023)    Exercise Vital Sign     Days of Exercise per Week: 7 days     Minutes of Exercise per Session: 60 min  Stress: Stress Concern Present (01/17/2023)    [employer] of Occupational Health - Occupational Stress Questionnaire     Feeling of Stress : To some extent  Social Connections: Unknown (01/17/2023)    Social Connection and Isolation Panel [NHANES]     Frequency of Communication with Friends and Family: More than three times a week     Frequency of Social Gatherings with Friends and Family: More than three times a week     Attends Religious Services: More than 4 times per year     Active Member of [employer] or Organizations: Yes     Attends Engineer, structural: More than 4 times per year     Marital Status: Patient unable to answer         Family  History  Problem Relation Age of Onset   Heart disease Mother     Heart disease Father     Diabetes Father     Hypertension Sister     Parkinson's disease Brother     Heart disease Maternal Grandmother     Cancer Paternal Grandmother          breast   Diabetes Paternal Grandfather     Hypertension Sister     Hypertension Sister     Down syndrome Son     Eczema Son     Post-traumatic stress disorder Daughter          Allergies       Allergies  Allergen Reactions   Ibuprofen Palpitations and Other (See Comments)      Rapid heart rate.            Current Outpatient Medications  Medication Sig Dispense Refill   acetaminophen (TYLENOL) 500 MG tablet Take 1 tablet (500 mg total) by mouth every 8 (eight) hours for 10 days. 30 tablet 0   aspirin EC 325 MG tablet Take 1 tablet (325 mg total) by mouth daily. 14 tablet 0   ibuprofen (ADVIL) 800 MG tablet Take 1 tablet (800 mg total) by mouth every 8 (eight) hours for 10 days. Please take with food, please alternate with acetaminophen 30 tablet 0   oxyCODONE (ROXICODONE) 5 MG immediate release tablet Take 1 tablet (5 mg total) by mouth every 4 (four) hours as needed for severe pain or breakthrough pain. 15 tablet 0   amLODipine (NORVASC) 5 MG tablet TAKE ONE TABLET BY MOUTH EVERY DAY 30 tablet 0   Ascorbic Acid (VITAMIN C PO) Take 1 tablet by mouth daily. Power C       citalopram (CELEXA) 40 MG tablet TAKE 1 TABLET BY MOUTH ONCE DAILY. 30 tablet 0   clotrimazole-betamethasone (LOTRISONE) cream Apply 1 application topically 2 (two) times daily. 45 g 1   ELDERBERRY PO Take 2 capsules by mouth daily.       fluticasone (FLONASE) 50 MCG/ACT nasal spray Place 2 sprays into both nostrils daily. Can do in the morning, or do one spray  in more and one at night. 16 g 2   gabapentin (NEURONTIN) 300 MG capsule TAKE (1) CAPSULE BY MOUTH AT BEDTIME. 90 capsule 0   loratadine (ALLERGY RELIEF) 10 MG tablet TAKE (1) TABLET BY MOUTH ONCE DAILY AS NEEDED  FOR ALLERGIES. 90 tablet 0   Omega-3 Fatty Acids (FISH OIL CONCENTRATE PO) Take 1,576 mg by mouth daily. 788 mg each       oxybutynin (DITROPAN) 5 MG tablet TAKE (1) TABLET BY MOUTH TWICE DAILY. 60 tablet 2   pantoprazole (PROTONIX) 20 MG tablet TAKE 1 TABLET BY MOUTH ONCE DAILY. 30 tablet 0   rosuvastatin (CRESTOR) 40 MG tablet TAKE 1 TABLET BY MOUTH ONCE DAILY. 90 tablet 0   vitamin B-12 (CYANOCOBALAMIN) 1000 MCG tablet Take 1,000 mcg by mouth daily.       Vitamin E 400 units TABS Take 400 Units by mouth daily.          No current facility-administered medications for this visit.      Imaging Results (Last 48 hours)  No results found.     Review of Systems:   A ROS was performed including pertinent positives and negatives as documented in the HPI.   Physical Exam :   Constitutional: NAD and appears stated age Neurological: Alert and oriented Psych: Appropriate affect and cooperative There were no vitals taken for this visit.    Comprehensive Musculoskeletal Exam:     Inspection Right Left  Skin No atrophy or gross abnormalities appreciated No atrophy or gross abnormalities appreciated  Palpation      Tenderness None None  Crepitus None None  Range of Motion      Flexion (passive) 120 120  Extension 30 30  IR 30 with pain 40  ER 45 45  Strength      Flexion  5/5 5/5  Extension 5/5 5/5  Special Tests      FABER Negative Negative  FADIR Negative Negative  ER Lag/Capsular Insufficiency Negative Negative  Instability Negative Negative  Sacroiliac pain Negative  Negative   Instability      Generalized Laxity No No  Neurologic      sciatic, femoral, obturator nerves intact to light sensation  Vascular/Lymphatic      DP pulse 2+ 2+  Lumbar Exam      Patient has symmetric lumbar range of motion with negative pain referral to hip        Imaging:   Xray (4 views right hip): Very mild degenerative findings with some ossification of the labrum   MRI (right  hip): There does appear to be a degenerative anterior superior labral tear with 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   I personally reviewed and interpreted the radiographs.     Assessment:   72 y.o. 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  At this time she has a trial of both land and aquatic therapy as well as an injection none of which have given her relief.  She has been seen by my partner Dr. Magnus Ivan who does not necessarily believe that she would be a good candidate for hip arthroplasty.  At this time she is hoping to undergo any type of intervention that would give her a prolonged pain relief.  Given that I did discuss hip arthroscopy.  I did discuss that it is somewhat atypical to consider this intervention at the age of 35 although that being said she does  have mild tonus grade 1 changes and I do believe that ultimately she would benefit from hip arthroscopy.  I did discuss the risks and limitations.  After long discussion she is elected for this.   Plan :     -Plan for right hip arthroscopy with labral repair     After a lengthy discussion of treatment options, including risks, benefits, alternatives, complications of surgical and nonsurgical conservative options, the patient elected surgical repair.    The patient  is aware of the material risks  and complications including, but not limited to injury to adjacent structures, neurovascular injury, infection, numbness, bleeding, implant failure, thermal burns, stiffness, persistent pain, failure to heal, disease transmission from allograft, need for further surgery, dislocation, anesthetic risks, blood clots, risks of death,and others. The probabilities of surgical success and failure discussed with patient given their particular co-morbidities.The time and nature of expected rehabilitation and recovery was discussed.The patient's  questions were all answered preoperatively.  No barriers to understanding were noted. I explained the natural history of the disease process and Rx rationale.  I explained to the patient what I considered to be reasonable expectations given their personal situation.  The final treatment plan was arrived at through a shared patient decision making process model.         I personally saw and evaluated the patient, and participated in the management and treatment plan.   Huel Cote, MD Attending Physician, Orthopedic Surgery

## 2023-09-04 NOTE — Anesthesia Procedure Notes (Addendum)
Procedure Name: LMA Insertion Date/Time: 09/04/2023 4:32 PM  Performed by: Pincus Large, CRNAPre-anesthesia Checklist: Patient identified, Emergency Drugs available, Suction available and Patient being monitored Patient Re-evaluated:Patient Re-evaluated prior to induction Oxygen Delivery Method: Circle System Utilized Preoxygenation: Pre-oxygenation with 100% oxygen Induction Type: IV induction Ventilation: Mask ventilation without difficulty LMA: LMA inserted LMA Size: 3.0 Number of attempts: 1 Airway Equipment and Method: Bite block Placement Confirmation: positive ETCO2 Tube secured with: Tape Dental Injury: Teeth and Oropharynx as per pre-operative assessment

## 2023-09-04 NOTE — Consult Note (Signed)
Chief Complaint: Right hip pain        History of Present Illness:    06/16/2023: Presents today for follow-up.  Recently been seen by Dr. Magnus Ivan who does not believe that she would be a candidate for hip arthroplasty.  She is here today for further discussion of the right hip.   [name] is a 72 y.o. female with right hip joint which has been going on for the last 3 to 4 months.  She is here today for further discussion.  She has been seeing Dr. Thane Edu this for this and performed an injection of the right hip.  She states that she got approximately 1 relief of good relief from this.  She has been experiencing swelling in the right lower extremity which does correlate somewhat with activity level.  She is continuing to be active and she is taking Mobic to relieve her pain.  This does help somewhat.  She has worse pain at the end of the day.       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   GERD (gastroesophageal reflux disease)     Glaucoma     Hyperlipidemia     Osteoarthritis               Past Surgical History:  Procedure Laterality Date   BREAST CYST EXCISION Right     COLONOSCOPY WITH PROPOFOL N/A 01/02/2022    Procedure: COLONOSCOPY WITH PROPOFOL;  Surgeon: Dolores Frame, MD;  Location: [facility];  Service: Gastroenterology;  Laterality: N/A;  1130   cyst removed from right breast       LAPAROSCOPIC SALPINGO OOPHERECTOMY Right 04/09/2017    Procedure: LAPAROSCOPIC RIGHT SALPINGO OOPHORECTOMY; RIGHT OVARIAN BENIGN CYSTIC TERATOMA;  Surgeon: Lazaro Arms, MD;  Location: [facility];  Service: Gynecology;  Laterality: Right;   PARTIAL HYSTERECTOMY       TUBAL LIGATION            Social History         Socioeconomic History   Marital status: Single      Spouse name:  Not on file   Number of children: 3   Years of education: 12   Highest education level: 12th grade  Occupational History   Not on file  Tobacco Use   Smoking status: Never   Smokeless tobacco: Never  Vaping Use   Vaping status: Never Used  Substance and Sexual Activity   Alcohol use: Not Currently      Comment: occasionally wine   Drug use: No   Sexual activity: Not Currently      Birth control/protection: Surgical      Comment: hyst  Other Topics Concern   Not on file  Social History Narrative   Not on file    Social Determinants of Health        Financial Resource Strain: Low Risk  (01/17/2023)    Overall Financial Resource Strain (CARDIA)     Difficulty of Paying Living Expenses: Not hard at all  Food Insecurity: No Food Insecurity (01/17/2023)    Hunger Vital Sign  Worried About Programme researcher, broadcasting/film/video in the Last Year: Never true     Ran Out of Food in the Last Year: Never true  Transportation Needs: No Transportation Needs (01/17/2023)    PRAPARE - Therapist, art (Medical): No     Lack of Transportation (Non-Medical): No  Physical Activity: Sufficiently Active (01/17/2023)    Exercise Vital Sign     Days of Exercise per Week: 7 days     Minutes of Exercise per Session: 60 min  Stress: Stress Concern Present (01/17/2023)    [employer] of Occupational Health - Occupational Stress Questionnaire     Feeling of Stress : To some extent  Social Connections: Unknown (01/17/2023)    Social Connection and Isolation Panel [NHANES]     Frequency of Communication with Friends and Family: More than three times a week     Frequency of Social Gatherings with Friends and Family: More than three times a week     Attends Religious Services: More than 4 times per year     Active Member of [employer] or Organizations: Yes     Attends Engineer, structural: More than 4 times per year     Marital Status: Patient unable to answer         Family  History  Problem Relation Age of Onset   Heart disease Mother     Heart disease Father     Diabetes Father     Hypertension Sister     Parkinson's disease Brother     Heart disease Maternal Grandmother     Cancer Paternal Grandmother          breast   Diabetes Paternal Grandfather     Hypertension Sister     Hypertension Sister     Down syndrome Son     Eczema Son     Post-traumatic stress disorder Daughter          Allergies       Allergies  Allergen Reactions   Ibuprofen Palpitations and Other (See Comments)      Rapid heart rate.            Current Outpatient Medications  Medication Sig Dispense Refill   acetaminophen (TYLENOL) 500 MG tablet Take 1 tablet (500 mg total) by mouth every 8 (eight) hours for 10 days. 30 tablet 0   aspirin EC 325 MG tablet Take 1 tablet (325 mg total) by mouth daily. 14 tablet 0   ibuprofen (ADVIL) 800 MG tablet Take 1 tablet (800 mg total) by mouth every 8 (eight) hours for 10 days. Please take with food, please alternate with acetaminophen 30 tablet 0   oxyCODONE (ROXICODONE) 5 MG immediate release tablet Take 1 tablet (5 mg total) by mouth every 4 (four) hours as needed for severe pain or breakthrough pain. 15 tablet 0   amLODipine (NORVASC) 5 MG tablet TAKE ONE TABLET BY MOUTH EVERY DAY 30 tablet 0   Ascorbic Acid (VITAMIN C PO) Take 1 tablet by mouth daily. Power C       citalopram (CELEXA) 40 MG tablet TAKE 1 TABLET BY MOUTH ONCE DAILY. 30 tablet 0   clotrimazole-betamethasone (LOTRISONE) cream Apply 1 application topically 2 (two) times daily. 45 g 1   ELDERBERRY PO Take 2 capsules by mouth daily.       fluticasone (FLONASE) 50 MCG/ACT nasal spray Place 2 sprays into both nostrils daily. Can do in the morning, or do one spray  in more and one at night. 16 g 2   gabapentin (NEURONTIN) 300 MG capsule TAKE (1) CAPSULE BY MOUTH AT BEDTIME. 90 capsule 0   loratadine (ALLERGY RELIEF) 10 MG tablet TAKE (1) TABLET BY MOUTH ONCE DAILY AS NEEDED  FOR ALLERGIES. 90 tablet 0   Omega-3 Fatty Acids (FISH OIL CONCENTRATE PO) Take 1,576 mg by mouth daily. 788 mg each       oxybutynin (DITROPAN) 5 MG tablet TAKE (1) TABLET BY MOUTH TWICE DAILY. 60 tablet 2   pantoprazole (PROTONIX) 20 MG tablet TAKE 1 TABLET BY MOUTH ONCE DAILY. 30 tablet 0   rosuvastatin (CRESTOR) 40 MG tablet TAKE 1 TABLET BY MOUTH ONCE DAILY. 90 tablet 0   vitamin B-12 (CYANOCOBALAMIN) 1000 MCG tablet Take 1,000 mcg by mouth daily.       Vitamin E 400 units TABS Take 400 Units by mouth daily.          No current facility-administered medications for this visit.      Imaging Results (Last 48 hours)  No results found.     Review of Systems:   A ROS was performed including pertinent positives and negatives as documented in the HPI.   Physical Exam :   Constitutional: NAD and appears stated age Neurological: Alert and oriented Psych: Appropriate affect and cooperative There were no vitals taken for this visit.    Comprehensive Musculoskeletal Exam:     Inspection Right Left  Skin No atrophy or gross abnormalities appreciated No atrophy or gross abnormalities appreciated  Palpation      Tenderness None None  Crepitus None None  Range of Motion      Flexion (passive) 120 120  Extension 30 30  IR 30 with pain 40  ER 45 45  Strength      Flexion  5/5 5/5  Extension 5/5 5/5  Special Tests      FABER Negative Negative  FADIR Negative Negative  ER Lag/Capsular Insufficiency Negative Negative  Instability Negative Negative  Sacroiliac pain Negative  Negative   Instability      Generalized Laxity No No  Neurologic      sciatic, femoral, obturator nerves intact to light sensation  Vascular/Lymphatic      DP pulse 2+ 2+  Lumbar Exam      Patient has symmetric lumbar range of motion with negative pain referral to hip        Imaging:   Xray (4 views right hip): Very mild degenerative findings with some ossification of the labrum   MRI (right  hip): There does appear to be a degenerative anterior superior labral tear with 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   I personally reviewed and interpreted the radiographs.     Assessment:   72 y.o. 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  At this time she has a trial of both land and aquatic therapy as well as an injection none of which have given her relief.  She has been seen by my partner Dr. Magnus Ivan who does not necessarily believe that she would be a good candidate for hip arthroplasty.  At this time she is hoping to undergo any type of intervention that would give her a prolonged pain relief.  Given that I did discuss hip arthroscopy.  I did discuss that it is somewhat atypical to consider this intervention at the age of 35 although that being said she does  have mild tonus grade 1 changes and I do believe that ultimately she would benefit from hip arthroscopy.  I did discuss the risks and limitations.  After long discussion she is elected for this.   Plan :     -Plan for right hip arthroscopy with labral repair     After a lengthy discussion of treatment options, including risks, benefits, alternatives, complications of surgical and nonsurgical conservative options, the patient elected surgical repair.    The patient  is aware of the material risks  and complications including, but not limited to injury to adjacent structures, neurovascular injury, infection, numbness, bleeding, implant failure, thermal burns, stiffness, persistent pain, failure to heal, disease transmission from allograft, need for further surgery, dislocation, anesthetic risks, blood clots, risks of death,and others. The probabilities of surgical success and failure discussed with patient given their particular co-morbidities.The time and nature of expected rehabilitation and recovery was discussed.The patient's  questions were all answered preoperatively.  No barriers to understanding were noted. I explained the natural history of the disease process and Rx rationale.  I explained to the patient what I considered to be reasonable expectations given their personal situation.  The final treatment plan was arrived at through a shared patient decision making process model.         I personally saw and evaluated the patient, and participated in the management and treatment plan.   Huel Cote, MD Attending Physician, Orthopedic Surgery

## 2023-09-04 NOTE — Addendum Note (Signed)
Encounter addended by: Linnell Fulling on: 09/04/2023 9:16 AM  Actions taken: Imaging Exam ended

## 2023-09-04 NOTE — Transfer of Care (Signed)
Immediate Anesthesia Transfer of Care Note  Patient: [name]  Procedure(s) Performed: RIGHT Arthroscopy Hip with Labral Repair (Right: Hip)  Patient Location: PACU  Anesthesia Type:General  Level of Consciousness: awake, alert , and oriented  Airway & Oxygen Therapy: Patient Spontanous Breathing and Patient connected to face mask oxygen  Post-op Assessment: Report given to RN and Post -op Vital signs reviewed and stable  Post vital signs: Reviewed and stable  Last Vitals:  Vitals Value Taken Time  BP 145/93 09/04/23 1737  Temp 97.3   Pulse 82 09/04/23 1740  Resp 14 09/04/23 1740  SpO2 100 % 09/04/23 1740  Vitals shown include unfiled device data.  Last Pain:  Vitals:   09/04/23 1325  TempSrc: Oral  PainSc:          Complications: No notable events documented.

## 2023-09-04 NOTE — Brief Op Note (Signed)
   Brief Op Note  Date of Surgery: 09/04/2023  Preoperative Diagnosis: RIGHT HIP LABRAL TEAR  Postoperative Diagnosis: same  Procedure: Procedure(s): RIGHT Arthroscopy Hip with Labral Repair  Implants: * No implants in log *  Surgeons: Surgeon(s): Huel Cote, MD  Anesthesia: General    Estimated Blood Loss: See anesthesia record  Complications: None  Condition to PACU: Stable  Benancio Deeds, MD 09/04/2023 5:17 PM

## 2023-09-04 NOTE — Op Note (Signed)
Date of Surgery: 09/04/2023  INDICATIONS: [name] is a 72 y.o.-year-old female with right degenerative labral tear.  The risk and benefits of the procedure were discussed in detail and documented in the pre-operative evaluation.   PREOPERATIVE DIAGNOSIS: 1. Right hip degenerative labral tear  POSTOPERATIVE DIAGNOSIS: Same.  PROCEDURE: 1. Right hip labral thermoshrinkage  SURGEON: Benancio Deeds MD  ASSISTANT: Kerby Less, ATC  ANESTHESIA:  general  IV FLUIDS AND URINE: See anesthesia record.  ANTIBIOTICS: Ancef  ESTIMATED BLOOD LOSS: 10 mL.  IMPLANTS:  * No implants in log *  DRAINS: None  CULTURES: None  COMPLICATIONS: none  DESCRIPTION OF PROCEDURE:  Cartilage Intact femoral and acetabular cartilage   Labrum degenerative appearing   Boundaries of labral tear Convention (3 o'clock anterior, 9 o'clock posterior) Anterior boundary: 3 o'clock Posterior boundary: 1 o'clock   OPERATIVE REPORT:  The patient was brought to the operating room, placed supine on the operating table, and bony prominences were padded.  The traction boots were applied with padding to ensure that safe traction could be applied through the feet.  The contralateral limb was abducted maximally and light traction was applied.  The operative leg was brought into neutral position.  The flouroscopic c-arm was brought between the legs for an AP image.  The patient was prepped and draped in a sterile fashion.  Time-out was performed and landmarks were identified. Traction was obtained and care was taken to ensure the least amount of force necessary to allow safe access to the joint of 8-58mm.  This was checked with fluoroscopy.    Next we placed an anterolateral portal under the assistance of fluoroscopy.  First, fluoroscopy was used to estimate the trajectory and starting point.  A 5mm incision with a #11 blade was made and a straight hemostat was used to dilate the portal through the appropriate  tract.  We then placed a 14-gauge hypodermic needle with careful technique to be as close to the femoral head as possible and parallel to the sorcele to ensure no iatrogenic damage to the labrum.  This released the negative pressure environment and the amount of traction was adjusted to maintain the 8-41mm of distraction.  A nitinol wire was placed through the needle and flouroscopy was used to ensure it extended to the medial wall of the acetabulum.  The Flow[employer] from [employer] was placed over the wire and the nitinol wire was retracted to just inside the capsule during insertion of the dilator and cannula to minimize the risk of breakage. The arthroscope was placed next and we visualized the anterior triangle.     We then placed the anterior portal under direct visualization using the technique described above.  This was safely placed as well without damage to the labrum or femoral head.  We then switched our arthroscope to the anterior portal to ensure we were not through the labrum - we were safely through the capsule only.  We then proceeded with a transverse capsulotomy extending the periportal incisions. The shaver and wand were introduced and the labrum was shrunk back to health margin with excellent suction seal when the traction was released at 20 minutes.  We then removed the arthroscope and closed the incisions with 3-0 nylon simple stitches.  A sterile dressing was applied..  The patient was awakened from anesthesia and transferred to PACU in stable condition. Postoperative care includes:       POSTOPERATIVE PLAN:    Weight bearing as tolerated right hip  Formal physical therapy will begin this week. ASA 325 Daily for DVT prophylaxis     Benancio Deeds, MD 5:17 PM

## 2023-09-04 NOTE — Interval H&P Note (Signed)
History and Physical Interval Note:  09/04/2023 3:53 PM  [name]  has presented today for surgery, with the diagnosis of RIGHT HIP LABRAL TEAR.  The various methods of treatment have been discussed with the patient and family. After consideration of risks, benefits and other options for treatment, the patient has consented to  Procedure(s): RIGHT Arthroscopy Hip with Labral Repair (Right) as a surgical intervention.  The patient's history has been reviewed, patient examined, no change in status, stable for surgery.  I have reviewed the patient's chart and labs.  Questions were answered to the patient's satisfaction.     Huel Cote

## 2023-09-04 NOTE — Discharge Instructions (Signed)
Discharge Instructions    Attending Surgeon: Huel Cote, MD Office Phone Number: [phone]   Diagnosis and Procedures:    Surgeries Performed: Right hip labral repair  Discharge Plan:    Diet: Resume usual diet. Begin with light or bland foods.  Drink plenty of fluids.  Activity:  Weight bearing as tolerated right hip. You are advised to go home directly from the hospital or surgical center. Restrict your activities.  GENERAL INSTRUCTIONS: 1.  Please apply ice to your wound to help with swelling and inflammation. This will improve your comfort and your overall recovery following surgery.     2. Please call Dr. Serena Croissant office at [phone] with questions Monday-Friday during business hours. If no one answers, please leave a message and someone should get back to the patient within 24 hours. For emergencies please call 911 or proceed to the emergency room.   3. Patient to notify surgical team if experiences any of the following: Bowel/Bladder dysfunction, uncontrolled pain, nerve/muscle weakness, incision with increased drainage or redness, nausea/vomiting and Fever greater than 101.0 F.  Be alert for signs of infection including redness, streaking, odor, fever or chills. Be alert for excessive pain or bleeding and notify your surgeon immediately.  WOUND INSTRUCTIONS:   Leave your dressing, cast, or splint in place until your post operative visit.  Keep it clean and dry.  Always keep the incision clean and dry until the staples/sutures are removed. If there is no drainage from the incision you should keep it open to air. If there is drainage from the incision you must keep it covered at all times until the drainage stops  Do not soak in a bath tub, hot tub, pool, lake or other body of water until 21 days after your surgery and your incision is completely dry and healed.  If you have removable sutures (or staples) they must be removed 10-14 days (unless otherwise  instructed) from the day of your surgery.     1)  Elevate the extremity as much as possible.  2)  Keep the dressing clean and dry.  3)  Please call us if the dressing becomes wet or dirty.  4)  If you are experiencing worsening pain or worsening swelling, please call.     MEDICATIONS: Resume all previous home medications at the previous prescribed dose and frequency unless otherwise noted Start taking the  pain medications on an as-needed basis as prescribed  Please taper down pain medication over the next week following surgery.  Ideally you should not require a refill of any narcotic pain medication.  Take pain medication with food to minimize nausea. In addition to the prescribed pain medication, you may take over-the-counter pain relievers such as Tylenol.  Do NOT take additional tylenol if your pain medication already has tylenol in it.  Aspirin 325mg  daily per instructions on bottle. Narcotic policy: Per [facility] clinic policy, our goal is ensure optimal postoperative pain control with a multimodal pain management strategy. For all [facility] patients, our goal is to wean post-operative narcotic medications by 6 weeks post-operatively, and many times sooner. If this is not possible due to utilization of pain medication prior to surgery, your [facility] doctor will support your acute post-operative pain control for the first 6 weeks postoperatively, with a plan to transition you back to your primary pain team following that. Cyndia Skeeters will work to ensure a Therapist, occupational.       FOLLOWUP INSTRUCTIONS: 1. Follow up at the Physical Therapy  Clinic 3-4 days following surgery. This appointment should be scheduled unless other arrangements have been made.The Physical Therapy scheduling number is [phone] if an appointment has not already been arranged.  2. Contact Dr. Serena Croissant office during office hours at [phone] or the practice after hours line at [phone] for non-emergencies.  For medical emergencies call 911.   Discharge Location: Home

## 2023-09-04 NOTE — Anesthesia Preprocedure Evaluation (Addendum)
Anesthesia Evaluation  Patient identified by MRN, date of birth, ID band Patient awake    Reviewed: Allergy & Precautions, NPO status , Patient's Chart, lab work & pertinent test results  History of Anesthesia Complications (+) PONV and history of anesthetic complications  Airway Mallampati: I  TM Distance: >3 FB Neck ROM: Full    Dental  (+) Dental Advisory Given, Teeth Intact, Missing,    Pulmonary neg shortness of breath, neg sleep apnea, neg COPD, neg recent URI   breath sounds clear to auscultation       Cardiovascular hypertension, Pt. on medications (-) angina (-) Past MI and (-) CHF  Rhythm:Regular  1. Left ventricular ejection fraction, by estimation, is 55 to 60%. The  left ventricle has normal function. The left ventricle has no regional  wall motion abnormalities. Left ventricular diastolic parameters were  normal.   2. Right ventricular systolic function is normal. The right ventricular  size is normal. There is normal pulmonary artery systolic pressure.   3. The mitral valve is normal in structure. No evidence of mitral valve  regurgitation. No evidence of mitral stenosis.   4. The aortic valve is tricuspid. There is mild calcification of the  aortic valve. There is mild thickening of the aortic valve. Aortic valve  regurgitation is not visualized. Aortic valve sclerosis is present, with  no evidence of aortic valve stenosis.   5. The inferior vena cava is normal in size with greater than 50%  respiratory variability, suggesting right atrial pressure of 3 mmHg.     Neuro/Psych  PSYCHIATRIC DISORDERS Anxiety Depression     Neuromuscular disease    GI/Hepatic Neg liver ROS,GERD  Medicated and Controlled,,  Endo/Other  negative endocrine ROS    Renal/GU negative Renal ROS     Musculoskeletal  (+) Arthritis ,   RIGHT HIP LABRAL TEAR   Abdominal   Peds  Hematology  (+) Blood dyscrasia, anemia Lab  Results      Component                Value               Date                      WBC                      6.6                 08/28/2023                HGB                      11.3 (L)            08/28/2023                HCT                      34.2 (L)            08/28/2023                MCV                      91.7                08/28/2023  PLT                      286                 08/28/2023              Anesthesia Other Findings   Reproductive/Obstetrics                             Anesthesia Physical Anesthesia Plan  ASA: 2  Anesthesia Plan: General   Post-op Pain Management: Ofirmev IV (intra-op)* and Toradol IV (intra-op)*   Induction: Intravenous  PONV Risk Score and Plan: 4 or greater and Ondansetron and Dexamethasone  Airway Management Planned: LMA and Oral ETT  Additional Equipment: None  Intra-op Plan:   Post-operative Plan: Extubation in OR  Informed Consent: I have reviewed the patients History and Physical, chart, labs and discussed the procedure including the risks, benefits and alternatives for the proposed anesthesia with the patient or authorized representative who has indicated his/her understanding and acceptance.     Dental advisory given  Plan Discussed with: CRNA  Anesthesia Plan Comments:        Anesthesia Quick Evaluation

## 2023-09-05 NOTE — Telephone Encounter (Signed)
Called patient form ready for pick up 

## 2023-09-05 NOTE — Telephone Encounter (Signed)
Form picked up

## 2023-09-05 NOTE — Anesthesia Postprocedure Evaluation (Signed)
Anesthesia Post Note  Patient: [name]  Procedure(s) Performed: RIGHT Arthroscopy Hip with Labral Repair (Right: Hip)     Patient location during evaluation: PACU Anesthesia Type: General Level of consciousness: awake and alert Pain management: pain level controlled Vital Signs Assessment: post-procedure vital signs reviewed and stable Respiratory status: spontaneous breathing, nonlabored ventilation, respiratory function stable and patient connected to nasal cannula oxygen Cardiovascular status: blood pressure returned to baseline and stable Postop Assessment: no apparent nausea or vomiting Anesthetic complications: no   No notable events documented.  Last Vitals:  Vitals:   09/04/23 1815 09/04/23 1830  BP: (!) 140/83 (!) 144/78  Pulse: 75 69  Resp: 13 12  Temp:  36.5 C  SpO2: 98% 95%    Last Pain:  Vitals:   09/04/23 1830  TempSrc:   PainSc: 4                  Iria Jamerson P Roran Wegner

## 2023-09-06 ENCOUNTER — Other Ambulatory Visit: Payer: Self-pay

## 2023-09-06 ENCOUNTER — Encounter (HOSPITAL_BASED_OUTPATIENT_CLINIC_OR_DEPARTMENT_OTHER): Payer: Self-pay | Admitting: Physical Therapy

## 2023-09-06 ENCOUNTER — Ambulatory Visit (HOSPITAL_BASED_OUTPATIENT_CLINIC_OR_DEPARTMENT_OTHER): Payer: Medicare Other | Attending: Orthopaedic Surgery | Admitting: Physical Therapy

## 2023-09-06 DIAGNOSIS — M25651 Stiffness of right hip, not elsewhere classified: Secondary | ICD-10-CM | POA: Insufficient documentation

## 2023-09-06 DIAGNOSIS — R262 Difficulty in walking, not elsewhere classified: Secondary | ICD-10-CM

## 2023-09-06 DIAGNOSIS — M6281 Muscle weakness (generalized): Secondary | ICD-10-CM | POA: Diagnosis not present

## 2023-09-06 DIAGNOSIS — M25551 Pain in right hip: Secondary | ICD-10-CM

## 2023-09-06 DIAGNOSIS — S73191A Other sprain of right hip, initial encounter: Secondary | ICD-10-CM | POA: Insufficient documentation

## 2023-09-06 NOTE — Therapy (Signed)
OUTPATIENT PHYSICAL THERAPY EVALUATION   Patient Name: [name] MRN: [id number] DOB:1951-01-31, 72 y.o., female Today's Date: 09/06/2023  END OF SESSION:  PT End of Session - 09/06/23 1121     Visit Number 1    Number of Visits 25    Date for PT Re-Evaluation 11/29/23    Authorization Type BCBS MCR- add KX at each visit    Progress Note Due on Visit 10    PT Start Time 1121    PT Stop Time 1200    PT Time Calculation (min) 39 min    Activity Tolerance Patient tolerated treatment well    Behavior During Therapy [facility] for tasks assessed/performed             Past Medical History:  Diagnosis Date   Allergy    Anxiety    Back pain with radiation 07/13/2012   GERD (gastroesophageal reflux disease)    Glaucoma    Hyperlipidemia    Hypertension    Osteoarthritis    PONV (postoperative nausea and vomiting)    Past Surgical History:  Procedure Laterality Date   BREAST CYST EXCISION Right    COLONOSCOPY WITH PROPOFOL N/A 01/02/2022   Procedure: COLONOSCOPY WITH PROPOFOL;  Surgeon: Dolores Frame, MD;  Location: [facility];  Service: Gastroenterology;  Laterality: N/A;  1130   cyst removed from right breast     LAPAROSCOPIC SALPINGO OOPHERECTOMY Right 04/09/2017   Procedure: LAPAROSCOPIC RIGHT SALPINGO OOPHORECTOMY; RIGHT OVARIAN BENIGN CYSTIC TERATOMA;  Surgeon: Lazaro Arms, MD;  Location: [facility];  Service: Gynecology;  Laterality: Right;   PARTIAL HYSTERECTOMY     TUBAL LIGATION     Patient Active Problem List   Diagnosis Date Noted   Tear of right acetabular labrum 09/04/2023   Family history of coronary artery disease 07/18/2023   Pre-op evaluation 07/06/2023   Nonspecific abnormal electrocardiogram (ECG) (EKG) 07/06/2023   Intermittent palpitations 07/06/2023   Syncope and collapse 07/06/2023   Sciatica of right side 02/27/2023   Osteoarthritis of hips, bilateral 09/24/2022   Cervicalgia 09/25/2020   Seasonal allergies 02/18/2020   Depression,  major, single episode, in partial remission (HCC) 10/29/2019   Essential hypertension 01/01/2018   Prediabetes 03/30/2015   Preop cardiovascular exam 04/08/2014   Right hip pain 04/06/2014   Hyperlipemia 06/22/2008   GERD (gastroesophageal reflux disease) 05/24/2008   Allergic rhinitis 01/08/2008   OVERACTIVE BLADDER 04/10/2007   GAD (generalized anxiety disorder) 08/22/2006   OSTEOARTHRITIS 08/22/2006    REFERRING PROVIDER:  Huel Cote, MD     REFERRING DIAG:  [phone] (ICD-10-CM) - Tear of right acetabular labrum, initial encounter    s/p Rt hip labral repair  Rationale for Evaluation and Treatment: Rehabilitation  THERAPY DIAG:  Pain in right hip  Muscle weakness (generalized)  Difficulty in walking, not elsewhere classified  ONSET DATE: DOS 11/7   SUBJECTIVE:  SUBJECTIVE STATEMENT: It hurts really bad  PERTINENT HISTORY:  See PMH  PAIN:  Are you having pain? Yes: NPRS scale: 10/10 Pain location: the whole right leg Pain description: stabbing Aggravating factors: constant Relieving factors: meds  PRECAUTIONS:  None  RED FLAGS: None   WEIGHT BEARING RESTRICTIONS:  Yes WBAT  FALLS:  Has patient fallen in last 6 months? No   PLOF:  Independent  PATIENT GOALS:  Get outside, walk, go shopping  OBJECTIVE:  Note: Objective measures were completed at Evaluation unless otherwise noted.  PATIENT SURVEYS:  FOTO 39  COGNITIVE STATUS: Within functional limits for tasks assessed   SENSATION: WFL    GAIT: EVAL Comments: RW, TDWB upon arrival     TREATMENT:                                                                                                                               DATE: EVAL 11/9 Gait training - heel toe pattern in WBAT Passive hip  abd with grade 1 distraction Ankle pumps Supine quad sets, glut sets     PATIENT EDUCATION:  Education details: Anatomy of condition, POC, HEP, exercise form/rationale Person educated: Patient Education method: Explanation, Demonstration, Tactile cues, Verbal cues, and Handouts Education comprehension: verbalized understanding, returned demonstration, verbal cues required, tactile cues required, and needs further education  HOME EXERCISE PROGRAM: [id number]   ASSESSMENT:  CLINICAL IMPRESSION: Patient is a 72 y.o. F who was seen today for physical therapy evaluation and treatment for s/p Rt hip labral repair.    REHAB POTENTIAL: Good  CLINICAL DECISION MAKING: Stable/uncomplicated  EVALUATION COMPLEXITY: Low   GOALS: Goals reviewed with patient? Yes  SHORT TERM GOALS: Target date: 4 weeks 12/7  Pain free flexion to 90 Baseline: Goal status: INITIAL   2.  Demo controlled quad set with hold Baseline:  Goal status: INITIAL   3.  30s sit to stand without pain or compensation Baseline:  Goal status: INITIAL   4.  SLS 30s without compensation Baseline:  Goal status: INITIAL  LONG TERM GOALS: Able to demo step up on 6" step with level pelvis and proper form Baseline:  Goal status: INITIAL Week 8 1/4  2.  Will tolerate at least 3 min on elliptical, demonstrating good tolerance to repetitive weight bearing motion Baseline:  Goal status: INITIAL  Week 8 11/01/23  3.  Demonstrate proper form in at least 10 continuous lunges without increased pain Baseline:  Goal status: INITIAL  Week 12 11/29/23  4.  Demonstrate gentle, double and single foot plyometric motions with good proximal form Baseline:  Goal status: INITIAL Week 12 11/29/23     PLAN:  PT FREQUENCY: 1-2x/week  PT DURATION: 12 weeks  PLANNED INTERVENTIONS: 97164- PT Re-evaluation, 97110-Therapeutic exercises, 97530- Therapeutic activity, 97112- Neuromuscular re-education, 97535- Self Care, 16109- Manual  therapy, [phone]- Gait training, [phone]- Aquatic Therapy, [phone]- Electrical stimulation (unattended), Patient/Family education, Balance training, Stair training, Taping, Dry Needling, Joint mobilization,  Spinal mobilization, Scar mobilization, Cryotherapy, and Moist heat.  PLAN FOR NEXT SESSION: per protocol   Kymorah Korf C. Kamariya Blevens PT, DPT 09/06/23 12:15 PM

## 2023-09-10 ENCOUNTER — Encounter (HOSPITAL_COMMUNITY): Payer: Self-pay | Admitting: Orthopaedic Surgery

## 2023-09-11 ENCOUNTER — Ambulatory Visit (HOSPITAL_BASED_OUTPATIENT_CLINIC_OR_DEPARTMENT_OTHER): Payer: Medicare Other | Admitting: Physical Therapy

## 2023-09-11 DIAGNOSIS — R262 Difficulty in walking, not elsewhere classified: Secondary | ICD-10-CM

## 2023-09-11 DIAGNOSIS — M6281 Muscle weakness (generalized): Secondary | ICD-10-CM

## 2023-09-11 DIAGNOSIS — M25651 Stiffness of right hip, not elsewhere classified: Secondary | ICD-10-CM | POA: Diagnosis not present

## 2023-09-11 DIAGNOSIS — M25551 Pain in right hip: Secondary | ICD-10-CM

## 2023-09-11 DIAGNOSIS — S73191A Other sprain of right hip, initial encounter: Secondary | ICD-10-CM | POA: Diagnosis not present

## 2023-09-11 NOTE — Therapy (Signed)
OUTPATIENT PHYSICAL THERAPY EVALUATION   Patient Name: [name] MRN: [id number] DOB:[birth year], 72 y.o., female Today's Date: 09/12/2023  END OF SESSION:  PT End of Session - 09/11/23 0905     Visit Number 2    Number of Visits 25    Date for PT Re-Evaluation 11/29/23    PT Start Time 0900    PT Stop Time 0941    PT Time Calculation (min) 41 min    Activity Tolerance Patient tolerated treatment well    Behavior During Therapy [facility] for tasks assessed/performed             Past Medical History:  Diagnosis Date   Allergy    Anxiety    Back pain with radiation 07/13/2012   GERD (gastroesophageal reflux disease)    Glaucoma    Hyperlipidemia    Hypertension    Osteoarthritis    PONV (postoperative nausea and vomiting)    Past Surgical History:  Procedure Laterality Date   BREAST CYST EXCISION Right    COLONOSCOPY WITH PROPOFOL N/A 01/02/2022   Procedure: COLONOSCOPY WITH PROPOFOL;  Surgeon: Dolores Frame, MD;  [facility]: [facility];  Service: Gastroenterology;  Laterality: N/A;  1130   cyst removed from right breast     LAPAROSCOPIC SALPINGO OOPHERECTOMY Right 04/09/2017   Procedure: LAPAROSCOPIC RIGHT SALPINGO OOPHORECTOMY; RIGHT OVARIAN BENIGN CYSTIC TERATOMA;  Surgeon: Lazaro Arms, MD;  Location: [facility];  Service: Gynecology;  Laterality: Right;   PARTIAL HYSTERECTOMY     TUBAL LIGATION     Patient Active Problem List   Diagnosis Date Noted   Tear of right acetabular labrum 09/04/2023   Family history of coronary artery disease 07/18/2023   Pre-op evaluation 07/06/2023   Nonspecific abnormal electrocardiogram (ECG) (EKG) 07/06/2023   Intermittent palpitations 07/06/2023   Syncope and collapse 07/06/2023   Sciatica of right side 02/27/2023   Osteoarthritis of hips, bilateral 09/24/2022   Cervicalgia 09/25/2020   Seasonal allergies 02/18/2020   Depression, major, single episode, in partial remission (HCC) 10/29/2019   Essential hypertension  01/01/2018   Prediabetes 03/30/2015   Preop cardiovascular exam 04/08/2014   Right hip pain 04/06/2014   Hyperlipemia 06/22/2008   GERD (gastroesophageal reflux disease) 05/24/2008   Allergic rhinitis 01/08/2008   OVERACTIVE BLADDER 04/10/2007   GAD (generalized anxiety disorder) 08/22/2006   OSTEOARTHRITIS 08/22/2006    REFERRING PROVIDER:  Huel Cote, MD     REFERRING DIAG:  [phone] (ICD-10-CM) - Tear of right acetabular labrum, initial encounter    s/p Rt hip labral repair  Rationale for Evaluation and Treatment: Rehabilitation  THERAPY DIAG:  Pain in right hip  Muscle weakness (generalized)  Difficulty in walking, not elsewhere classified  ONSET DATE: DOS 11/7   SUBJECTIVE:  SUBJECTIVE STATEMENT: Pt is 1 week s/p R hip labral thermoshrinkage.  Pt reports she took some steps without AD at home without increased pain.  Pt reports she has been performing ankle pumps, but not her other exercises.      PERTINENT HISTORY:  See PMH  PAIN:  Are you having pain? Yes: NPRS scale: 5/10 Pain location: the whole right leg Pain description: stabbing Aggravating factors: constant Relieving factors: meds  PRECAUTIONS:  None  RED FLAGS: None   WEIGHT BEARING RESTRICTIONS:  Yes WBAT  FALLS:  Has patient fallen in last 6 months? No   PLOF:  Independent  PATIENT GOALS:  Get outside, walk, go shopping  OBJECTIVE:  Note: Objective measures were completed at Evaluation unless otherwise noted.    TREATMENT:                                                                                                                               Gait:  Pt educated pt in WBAT orders and gait with FWW.  Adusted walker height and educated pt with appropriate walker height.   Reviewed and  performed HEP.   Pt performed: Ankle pumps x 20 reps Prone lying x 2 min Quad sets with 5 sec hold 2x10 reps Glute sets with 5 sec hold 2x10 reps  PT changed bandage.  PT removed old tegaderm and gauze and applied new gauze and tegaderm over portals.      PATIENT EDUCATION:  Education details: Anatomy of condition, POC, HEP, exercise form/rationale, protocol and post op restrictions, WBAT orders, and gait.  Person educated: Patient Education method: Explanation, Demonstration, Tactile cues, Verbal cues, and Handouts Education comprehension: verbalized understanding, returned demonstration, verbal cues required, tactile cues required, and needs further education  HOME EXERCISE PROGRAM: [id number]   ASSESSMENT:  CLINICAL IMPRESSION: Pt presents to treatment with reduced pain.  Pt ambulates in clinic witih FWW without increased pain.  PT instructed pt in gait with FWW with WBAT restrictions.  PT reviewed HEP and pt performed HEP.  Pt performed exercises well with instruction and cuing for correct form.  PT assisted pt with moving R LE with bed mobility.  PT changed bandage and portals were dry and had no signs of infection.  She responded well to Rx reporting improved pain from 5/10 before Rx to 4/10 pain after Rx.  Pt states she feels better after Rx.  Pt should benefit from cont skilled PT services per protocol to address impairments and goals and to improve overall function.     REHAB POTENTIAL: Good  CLINICAL DECISION MAKING: Stable/uncomplicated  EVALUATION COMPLEXITY: Low   GOALS: Goals reviewed with patient? Yes  SHORT TERM GOALS: Target date: 4 weeks 12/7  Pain free flexion to 90 Baseline: Goal status: INITIAL   2.  Demo controlled quad set with hold Baseline:  Goal status: INITIAL   3.  30s sit to stand without pain or compensation Baseline:  Goal status: INITIAL  4.  SLS 30s without compensation Baseline:  Goal status: INITIAL  LONG TERM GOALS: Able to  demo step up on 6" step with level pelvis and proper form Baseline:  Goal status: INITIAL Week 8 1/4  2.  Will tolerate at least 3 min on elliptical, demonstrating good tolerance to repetitive weight bearing motion Baseline:  Goal status: INITIAL  Week 8 11/01/23  3.  Demonstrate proper form in at least 10 continuous lunges without increased pain Baseline:  Goal status: INITIAL  Week 12 11/29/23  4.  Demonstrate gentle, double and single foot plyometric motions with good proximal form Baseline:  Goal status: INITIAL Week 12 11/29/23     PLAN:  PT FREQUENCY: 1-2x/week  PT DURATION: 12 weeks  PLANNED INTERVENTIONS: 97164- PT Re-evaluation, 97110-Therapeutic exercises, 97530- Therapeutic activity, 97112- Neuromuscular re-education, 97535- Self Care, 40981- Manual therapy, [phone]- Gait training, [phone]- Aquatic Therapy, 97014- Electrical stimulation (unattended), Patient/Family education, Balance training, Stair training, Taping, Dry Needling, Joint mobilization, Spinal mobilization, Scar mobilization, Cryotherapy, and Moist heat.  PLAN FOR NEXT SESSION: Cont per Dr. Serena Croissant protocol and MD orders.   Audie Clear III PT, DPT 09/12/23 8:45 AM

## 2023-09-12 ENCOUNTER — Encounter (HOSPITAL_BASED_OUTPATIENT_CLINIC_OR_DEPARTMENT_OTHER): Payer: Self-pay | Admitting: Physical Therapy

## 2023-09-17 ENCOUNTER — Other Ambulatory Visit: Payer: Self-pay | Admitting: Family Medicine

## 2023-09-18 ENCOUNTER — Ambulatory Visit (HOSPITAL_BASED_OUTPATIENT_CLINIC_OR_DEPARTMENT_OTHER): Payer: Medicare Other | Admitting: Physical Therapy

## 2023-09-18 ENCOUNTER — Encounter (HOSPITAL_BASED_OUTPATIENT_CLINIC_OR_DEPARTMENT_OTHER): Payer: Self-pay | Admitting: Physical Therapy

## 2023-09-18 DIAGNOSIS — M25551 Pain in right hip: Secondary | ICD-10-CM

## 2023-09-18 DIAGNOSIS — R262 Difficulty in walking, not elsewhere classified: Secondary | ICD-10-CM | POA: Diagnosis not present

## 2023-09-18 DIAGNOSIS — M25651 Stiffness of right hip, not elsewhere classified: Secondary | ICD-10-CM | POA: Diagnosis not present

## 2023-09-18 DIAGNOSIS — M6281 Muscle weakness (generalized): Secondary | ICD-10-CM | POA: Diagnosis not present

## 2023-09-18 DIAGNOSIS — S73191A Other sprain of right hip, initial encounter: Secondary | ICD-10-CM | POA: Diagnosis not present

## 2023-09-18 NOTE — Therapy (Signed)
OUTPATIENT PHYSICAL THERAPY EVALUATION   Patient Name: [name] MRN: [id number] DOB:24-Sep-1951, 72 y.o., female Today's Date: 09/18/2023  END OF SESSION:  PT End of Session - 09/18/23 0856     Visit Number 3    Number of Visits 25    Date for PT Re-Evaluation 11/29/23    Authorization Type [employer] MCR- add KX at each visit    Progress Note Due on Visit 10    PT Start Time 0855   arrives late   PT Stop Time 0926    PT Time Calculation (min) 31 min    Activity Tolerance Patient tolerated treatment well    Behavior During Therapy [facility] for tasks assessed/performed             Past Medical History:  Diagnosis Date   Allergy    Anxiety    Back pain with radiation 07/13/2012   GERD (gastroesophageal reflux disease)    Glaucoma    Hyperlipidemia    Hypertension    Osteoarthritis    PONV (postoperative nausea and vomiting)    Past Surgical History:  Procedure Laterality Date   BREAST CYST EXCISION Right    COLONOSCOPY WITH PROPOFOL N/A 01/02/2022   Procedure: COLONOSCOPY WITH PROPOFOL;  Surgeon: Dolores Frame, MD;  [facility]: [facility];  Service: Gastroenterology;  Laterality: N/A;  1130   cyst removed from right breast     LAPAROSCOPIC SALPINGO OOPHERECTOMY Right 04/09/2017   Procedure: LAPAROSCOPIC RIGHT SALPINGO OOPHORECTOMY; RIGHT OVARIAN BENIGN CYSTIC TERATOMA;  Surgeon: Lazaro Arms, MD;  Location: [facility];  Service: Gynecology;  Laterality: Right;   PARTIAL HYSTERECTOMY     TUBAL LIGATION     Patient Active Problem List   Diagnosis Date Noted   Tear of right acetabular labrum 09/04/2023   Family history of coronary artery disease 07/18/2023   Pre-op evaluation 07/06/2023   Nonspecific abnormal electrocardiogram (ECG) (EKG) 07/06/2023   Intermittent palpitations 07/06/2023   Syncope and collapse 07/06/2023   Sciatica of right side 02/27/2023   Osteoarthritis of hips, bilateral 09/24/2022   Cervicalgia 09/25/2020   Seasonal allergies 02/18/2020    Depression, major, single episode, in partial remission (HCC) 10/29/2019   Essential hypertension 01/01/2018   Prediabetes 03/30/2015   Preop cardiovascular exam 04/08/2014   Right hip pain 04/06/2014   Hyperlipemia 06/22/2008   GERD (gastroesophageal reflux disease) 05/24/2008   Allergic rhinitis 01/08/2008   OVERACTIVE BLADDER 04/10/2007   GAD (generalized anxiety disorder) 08/22/2006   OSTEOARTHRITIS 08/22/2006    REFERRING PROVIDER:  Huel Cote, MD     REFERRING DIAG:  [phone] (ICD-10-CM) - Tear of right acetabular labrum, initial encounter    s/p Rt hip labral repair  Rationale for Evaluation and Treatment: Rehabilitation  THERAPY DIAG:  Pain in right hip  Muscle weakness (generalized)  Difficulty in walking, not elsewhere classified  Stiffness of right hip, not elsewhere classified  ONSET DATE: DOS 11/7   SUBJECTIVE:  SUBJECTIVE STATEMENT: Pt is 2 week s/p R hip labral thermoshrinkage. Feels a little better today. Trying not to overdo it. HEP going well.   PERTINENT HISTORY:  See PMH  PAIN:  Are you having pain? Yes: NPRS scale: 7/10 Pain location: the whole right leg Pain description: stabbing Aggravating factors: constant Relieving factors: meds  PRECAUTIONS:  None  RED FLAGS: None   WEIGHT BEARING RESTRICTIONS:  Yes WBAT  FALLS:  Has patient fallen in last 6 months? No   PLOF:  Independent  PATIENT GOALS:  Get outside, walk, go shopping  OBJECTIVE:  Note: Objective measures were completed at Evaluation unless otherwise noted.    TREATMENT:                                                                                                                              09/18/23 Ankle pumps x 20 reps Quad sets with 5 sec hold 2x10 reps Glute sets  with 5 sec hold 2x10 reps Bridge 2 x 10  Sidelying mini clam 2 x 10  Ab set 1 x 10 TA activation with bent knee fall outs 2 x 10  Quadruped rocking 2 x 10 Quadruped pelvic tilts  2 x 10   09/11/23 Gait:  Pt educated pt in WBAT orders and gait with FWW.  Adusted walker height and educated pt with appropriate walker height.   Reviewed and performed HEP.   Pt performed: Ankle pumps x 20 reps Prone lying x 2 min Quad sets with 5 sec hold 2x10 reps Glute sets with 5 sec hold 2x10 reps  PT changed bandage.  PT removed old tegaderm and gauze and applied new gauze and tegaderm over portals.      PATIENT EDUCATION:  Education details: Anatomy of condition, POC, HEP, exercise form/rationale, protocol and post op restrictions, WBAT orders, and gait.  Person educated: Patient Education method: Explanation, Demonstration, Tactile cues, Verbal cues, and Handouts Education comprehension: verbalized understanding, returned demonstration, verbal cues required, tactile cues required, and needs further education  HOME EXERCISE PROGRAM: [id number]   ASSESSMENT:  CLINICAL IMPRESSION: Began session with previously completed exercises which are tolerated well. Began additional glute strengthening and mobility exercises which are performed well. Fatigue at 6/10 at end of session. Patient will continue to benefit from physical therapy in order to improve function and reduce impairment.    REHAB POTENTIAL: Good  CLINICAL DECISION MAKING: Stable/uncomplicated  EVALUATION COMPLEXITY: Low   GOALS: Goals reviewed with patient? Yes  SHORT TERM GOALS: Target date: 4 weeks 12/7  Pain free flexion to 90 Baseline: Goal status: INITIAL   2.  Demo controlled quad set with hold Baseline:  Goal status: INITIAL   3.  30s sit to stand without pain or compensation Baseline:  Goal status: INITIAL   4.  SLS 30s without compensation Baseline:  Goal status: INITIAL  LONG TERM GOALS: Able to demo  step up on 6" step with level pelvis and proper  form Baseline:  Goal status: INITIAL Week 8 1/4  2.  Will tolerate at least 3 min on elliptical, demonstrating good tolerance to repetitive weight bearing motion Baseline:  Goal status: INITIAL  Week 8 11/01/23  3.  Demonstrate proper form in at least 10 continuous lunges without increased pain Baseline:  Goal status: INITIAL  Week 12 11/29/23  4.  Demonstrate gentle, double and single foot plyometric motions with good proximal form Baseline:  Goal status: INITIAL Week 12 11/29/23     PLAN:  PT FREQUENCY: 1-2x/week  PT DURATION: 12 weeks  PLANNED INTERVENTIONS: 97164- PT Re-evaluation, 97110-Therapeutic exercises, 97530- Therapeutic activity, 97112- Neuromuscular re-education, 97535- Self Care, 16109- Manual therapy, [phone]- Gait training, [phone]- Aquatic Therapy, 97014- Electrical stimulation (unattended), Patient/Family education, Balance training, Stair training, Taping, Dry Needling, Joint mobilization, Spinal mobilization, Scar mobilization, Cryotherapy, and Moist heat.  PLAN FOR NEXT SESSION: Cont per Dr. Serena Croissant protocol and MD orders.    Reola Mosher Myna Freimark, PT 09/18/2023, 9:27 AM

## 2023-09-23 ENCOUNTER — Other Ambulatory Visit: Payer: Self-pay | Admitting: Family Medicine

## 2023-09-24 ENCOUNTER — Encounter (HOSPITAL_BASED_OUTPATIENT_CLINIC_OR_DEPARTMENT_OTHER): Payer: Self-pay

## 2023-09-24 ENCOUNTER — Ambulatory Visit (HOSPITAL_BASED_OUTPATIENT_CLINIC_OR_DEPARTMENT_OTHER): Payer: Medicare Other

## 2023-09-24 ENCOUNTER — Ambulatory Visit (HOSPITAL_BASED_OUTPATIENT_CLINIC_OR_DEPARTMENT_OTHER): Payer: Medicare Other | Admitting: Orthopaedic Surgery

## 2023-09-24 DIAGNOSIS — M25551 Pain in right hip: Secondary | ICD-10-CM

## 2023-09-24 DIAGNOSIS — S73191A Other sprain of right hip, initial encounter: Secondary | ICD-10-CM | POA: Diagnosis not present

## 2023-09-24 DIAGNOSIS — R262 Difficulty in walking, not elsewhere classified: Secondary | ICD-10-CM

## 2023-09-24 DIAGNOSIS — M25651 Stiffness of right hip, not elsewhere classified: Secondary | ICD-10-CM | POA: Diagnosis not present

## 2023-09-24 DIAGNOSIS — M6281 Muscle weakness (generalized): Secondary | ICD-10-CM

## 2023-09-24 NOTE — Progress Notes (Signed)
Post Operative Evaluation    Procedure/Date of Surgery: Right hip arthroscopy and debridement 11/7  Interval History:   Presents today 2 weeks status post the above procedure.  She is overall doing very well.  She is having some pain with weightbearing.  She has been progressing using a walker.  She is working with physical therapy   PMH/PSH/Family History/Social History/Meds/Allergies:    Past Medical History:  Diagnosis Date   Allergy    Anxiety    Back pain with radiation 07/13/2012   GERD (gastroesophageal reflux disease)    Glaucoma    Hyperlipidemia    Hypertension    Osteoarthritis    PONV (postoperative nausea and vomiting)    Past Surgical History:  Procedure Laterality Date   BREAST CYST EXCISION Right    COLONOSCOPY WITH PROPOFOL N/A 01/02/2022   Procedure: COLONOSCOPY WITH PROPOFOL;  Surgeon: Dolores Frame, MD;  Location: [facility];  Service: Gastroenterology;  Laterality: N/A;  1130   cyst removed from right breast     LAPAROSCOPIC SALPINGO OOPHERECTOMY Right 04/09/2017   Procedure: LAPAROSCOPIC RIGHT SALPINGO OOPHORECTOMY; RIGHT OVARIAN BENIGN CYSTIC TERATOMA;  Surgeon: Lazaro Arms, MD;  Location: [facility];  Service: Gynecology;  Laterality: Right;   PARTIAL HYSTERECTOMY     TUBAL LIGATION     Social History   Socioeconomic History   Marital status: Single    Spouse name: Not on file   Number of children: 3   Years of education: 12   Highest education level: 12th grade  Occupational History   Not on file  Tobacco Use   Smoking status: Never   Smokeless tobacco: Never  Vaping Use   Vaping status: Never Used  Substance and Sexual Activity   Alcohol use: Not Currently    Comment: occasionally wine   Drug use: No   Sexual activity: Not Currently    Birth control/protection: Surgical    Comment: hyst  Other Topics Concern   Not on file  Social History Narrative   Not on file   Social  Determinants of Health   Financial Resource Strain: Low Risk  (01/17/2023)   Overall Financial Resource Strain (CARDIA)    Difficulty of Paying Living Expenses: Not hard at all  Food Insecurity: No Food Insecurity (01/17/2023)   Hunger Vital Sign    Worried About Running Out of Food in the Last Year: Never true    Ran Out of Food in the Last Year: Never true  Transportation Needs: No Transportation Needs (01/17/2023)   PRAPARE - Administrator, Civil Service (Medical): No    Lack of Transportation (Non-Medical): No  Physical Activity: Sufficiently Active (01/17/2023)   Exercise Vital Sign    Days of Exercise per Week: 7 days    Minutes of Exercise per Session: 60 min  Stress: Stress Concern Present (01/17/2023)   [employer] of Occupational Health - Occupational Stress Questionnaire    Feeling of Stress : To some extent  Social Connections: Unknown (01/17/2023)   Social Connection and Isolation Panel [NHANES]    Frequency of Communication with Friends and Family: More than three times a week    Frequency of Social Gatherings with Friends and Family: More than three times a week    Attends Religious Services: More than 4 times per year  Active Member of Clubs or Organizations: Yes    Attends Engineer, structural: More than 4 times per year    Marital Status: Patient unable to answer   Family History  Problem Relation Age of Onset   Heart disease Mother    Heart disease Father    Diabetes Father    Hypertension Sister    Parkinson's disease Brother    Heart disease Maternal Grandmother    Cancer Paternal Grandmother        breast   Diabetes Paternal Grandfather    Hypertension Sister    Hypertension Sister    Down syndrome Son    Eczema Son    Post-traumatic stress disorder Daughter    Allergies  Allergen Reactions   Ibuprofen Palpitations and Other (See Comments)    Rapid heart rate.   Current Outpatient Medications  Medication Sig Dispense  Refill   amLODipine (NORVASC) 5 MG tablet Take 1 tablet (5 mg total) by mouth daily. 30 tablet 0   Ascorbic Acid (VITAMIN C PO) Take 1 tablet by mouth daily. Power C     aspirin EC 325 MG tablet Take 1 tablet (325 mg total) by mouth daily. 14 tablet 0   citalopram (CELEXA) 40 MG tablet Take 1 tablet (40 mg total) by mouth daily. 30 tablet 0   ELDERBERRY PO Take 2 capsules by mouth daily.     fluticasone (FLONASE) 50 MCG/ACT nasal spray Place 2 sprays into both nostrils daily. Can do in the morning, or do one spray in more and one at night. 16 g 5   gabapentin (NEURONTIN) 300 MG capsule One capsule at bedtime 90 capsule 0   loratadine (ALLERGY RELIEF) 10 MG tablet TAKE (1) TABLET BY MOUTH ONCE DAILY AS NEEDED FOR ALLERGIES. 90 tablet 0   Omega-3 Fatty Acids (FISH OIL CONCENTRATE PO) Take 2 capsules by mouth daily. 788 mg each     oxybutynin (DITROPAN) 5 MG tablet TAKE ONE TABLET BY MOUTH 2 TIMES A DAY 60 tablet 2   oxyCODONE (ROXICODONE) 5 MG immediate release tablet Take 1 tablet (5 mg total) by mouth every 4 (four) hours as needed for severe pain (pain score 7-10) or breakthrough pain. 30 tablet 0   pantoprazole (PROTONIX) 20 MG tablet TAKE 1 TABLET BY MOUTH ONCE DAILY. 30 tablet 0   rosuvastatin (CRESTOR) 40 MG tablet TAKE 1 TABLET BY MOUTH ONCE DAILY. 90 tablet 0   vitamin B-12 (CYANOCOBALAMIN) 1000 MCG tablet Take 1,000 mcg by mouth daily.     Vitamin E 400 units TABS Take 400 Units by mouth daily.     No current facility-administered medications for this visit.   No results found.  Review of Systems:   A ROS was performed including pertinent positives and negatives as documented in the HPI.   Musculoskeletal Exam:    There were no vitals taken for this visit.  Right hip incisions are well-appearing without erythema or drainage.  30 degrees internal/external rotation of the right hip.  She has active flexion of 30 degrees 90.  She walks with mildly antalgic gait  Imaging:       I personally reviewed and interpreted the radiographs.   Assessment:   2 weeks status post right hip arthroscopic labral debridement overall doing well.  At this time she will continue to progress to full weightbearing.  I will plan to see her back in 4 weeks for reassessment  Plan :    -Return to clinic 4 weeks for  reassessment      I personally saw and evaluated the patient, and participated in the management and treatment plan.  Huel Cote, MD Attending Physician, Orthopedic Surgery  This document was dictated using Dragon voice recognition software. A reasonable attempt at proof reading has been made to minimize errors.

## 2023-09-24 NOTE — Therapy (Signed)
OUTPATIENT PHYSICAL THERAPY TREATMENT   Patient Name: [name] MRN: [id number] DOB:1951-01-08, 72 y.o., female Today's Date: 09/24/2023  END OF SESSION:  PT End of Session - 09/24/23 1430     Visit Number 4    Number of Visits 25    Date for PT Re-Evaluation 11/29/23    Authorization Type BCBS MCR- add KX at each visit    Progress Note Due on Visit 10    PT Start Time 1402    PT Stop Time 1445    PT Time Calculation (min) 43 min    Activity Tolerance Patient tolerated treatment well    Behavior During Therapy [facility] for tasks assessed/performed              Past Medical History:  Diagnosis Date   Allergy    Anxiety    Back pain with radiation 07/13/2012   GERD (gastroesophageal reflux disease)    Glaucoma    Hyperlipidemia    Hypertension    Osteoarthritis    PONV (postoperative nausea and vomiting)    Past Surgical History:  Procedure Laterality Date   BREAST CYST EXCISION Right    COLONOSCOPY WITH PROPOFOL N/A 01/02/2022   Procedure: COLONOSCOPY WITH PROPOFOL;  Surgeon: Dolores Frame, MD;  Location: [facility];  Service: Gastroenterology;  Laterality: N/A;  1130   cyst removed from right breast     LAPAROSCOPIC SALPINGO OOPHERECTOMY Right 04/09/2017   Procedure: LAPAROSCOPIC RIGHT SALPINGO OOPHORECTOMY; RIGHT OVARIAN BENIGN CYSTIC TERATOMA;  Surgeon: Lazaro Arms, MD;  Location: [facility];  Service: Gynecology;  Laterality: Right;   PARTIAL HYSTERECTOMY     TUBAL LIGATION     Patient Active Problem List   Diagnosis Date Noted   Tear of right acetabular labrum 09/04/2023   Family history of coronary artery disease 07/18/2023   Pre-op evaluation 07/06/2023   Nonspecific abnormal electrocardiogram (ECG) (EKG) 07/06/2023   Intermittent palpitations 07/06/2023   Syncope and collapse 07/06/2023   Sciatica of right side 02/27/2023   Osteoarthritis of hips, bilateral 09/24/2022   Cervicalgia 09/25/2020   Seasonal allergies 02/18/2020    Depression, major, single episode, in partial remission (HCC) 10/29/2019   Essential hypertension 01/01/2018   Prediabetes 03/30/2015   Preop cardiovascular exam 04/08/2014   Right hip pain 04/06/2014   Hyperlipemia 06/22/2008   GERD (gastroesophageal reflux disease) 05/24/2008   Allergic rhinitis 01/08/2008   OVERACTIVE BLADDER 04/10/2007   GAD (generalized anxiety disorder) 08/22/2006   OSTEOARTHRITIS 08/22/2006    REFERRING PROVIDER:  Huel Cote, MD     REFERRING DIAG:  [phone] (ICD-10-CM) - Tear of right acetabular labrum, initial encounter    s/p Rt hip labral repair  Rationale for Evaluation and Treatment: Rehabilitation  THERAPY DIAG:  Pain in right hip  Muscle weakness (generalized)  Difficulty in walking, not elsewhere classified  ONSET DATE: DOS 11/7   SUBJECTIVE:  SUBJECTIVE STATEMENT: Pt reports 7/10 pain level. "It's been more sore since surgery."  PERTINENT HISTORY:  See PMH  PAIN:  Are you having pain? Yes: NPRS scale: 7/10 Pain location: the whole right leg Pain description: stabbing Aggravating factors: constant Relieving factors: meds  PRECAUTIONS:  None  RED FLAGS: None   WEIGHT BEARING RESTRICTIONS:  Yes WBAT  FALLS:  Has patient fallen in last 6 months? No   PLOF:  Independent  PATIENT GOALS:  Get outside, walk, go shopping  OBJECTIVE:  Note: Objective measures were completed at Evaluation unless otherwise noted.    TREATMENT:                                                                                                                               09/24/23 Gentle PROM R hip Quad sets with 5 sec hold 2x10 reps Hip flexor stretching (LE to neutral in supine. Starting with pillow under LE) STM and IASTM to hip fleoxrs/quads with  roler Education on positioning Gait training in hall with FWW- cues for increased step length   09/18/23 Ankle pumps x 20 reps Quad sets with 5 sec hold 2x10 reps Glute sets with 5 sec hold 2x10 reps Bridge 2 x 10  Sidelying mini clam 2 x 10  Ab set 1 x 10 TA activation with bent knee fall outs 2 x 10  Quadruped rocking 2 x 10 Quadruped pelvic tilts  2 x 10   09/11/23 Gait:  Pt educated pt in WBAT orders and gait with FWW.  Adusted walker height and educated pt with appropriate walker height.   Reviewed and performed HEP.   Pt performed: Ankle pumps x 20 reps Prone lying x 2 min Quad sets with 5 sec hold 2x10 reps Glute sets with 5 sec hold 2x10 reps  PT changed bandage.  PT removed old tegaderm and gauze and applied new gauze and tegaderm over portals.      PATIENT EDUCATION:  Education details: Anatomy of condition, POC, HEP, exercise form/rationale, protocol and post op restrictions, WBAT orders, and gait.  Person educated: Patient Education method: Explanation, Demonstration, Tactile cues, Verbal cues, and Handouts Education comprehension: verbalized understanding, returned demonstration, verbal cues required, tactile cues required, and needs further education  HOME EXERCISE PROGRAM: [id number]   ASSESSMENT:  CLINICAL IMPRESSION: Pt will be 3 weeks s/p tomorrow.  Significant guarding present with passive range of motion in all planes, but worse with flexion.  Careful not to exceed pain limitations.  Patient demonstrates significant tightness and hip flexors and quads.  Unable to lay supine with right LE extended fully.  Educated patient on methods to gently stretch this at home to achieve full extension to neutral.  Patient requires cueing with gait training for increased step length.  This did improve significantly following cues. Will continue with FWW until weightbearing tolerance and hip extension improves.  Patient very apprehensive for weightbearing with gait.    Patient to see  surgeon for follow-up this afternoon.   REHAB POTENTIAL: Good  CLINICAL DECISION MAKING: Stable/uncomplicated  EVALUATION COMPLEXITY: Low   GOALS: Goals reviewed with patient? Yes  SHORT TERM GOALS: Target date: 4 weeks 12/7  Pain free flexion to 90 Baseline: Goal status: INITIAL   2.  Demo controlled quad set with hold Baseline:  Goal status: INITIAL   3.  30s sit to stand without pain or compensation Baseline:  Goal status: INITIAL   4.  SLS 30s without compensation Baseline:  Goal status: INITIAL  LONG TERM GOALS: Able to demo step up on 6" step with level pelvis and proper form Baseline:  Goal status: INITIAL Week 8 1/4  2.  Will tolerate at least 3 min on elliptical, demonstrating good tolerance to repetitive weight bearing motion Baseline:  Goal status: INITIAL  Week 8 11/01/23  3.  Demonstrate proper form in at least 10 continuous lunges without increased pain Baseline:  Goal status: INITIAL  Week 12 11/29/23  4.  Demonstrate gentle, double and single foot plyometric motions with good proximal form Baseline:  Goal status: INITIAL Week 12 11/29/23     PLAN:  PT FREQUENCY: 1-2x/week  PT DURATION: 12 weeks  PLANNED INTERVENTIONS: 97164- PT Re-evaluation, 97110-Therapeutic exercises, 97530- Therapeutic activity, 97112- Neuromuscular re-education, 97535- Self Care, 16109- Manual therapy, [phone]- Gait training, [phone]- Aquatic Therapy, 97014- Electrical stimulation (unattended), Patient/Family education, Balance training, Stair training, Taping, Dry Needling, Joint mobilization, Spinal mobilization, Scar mobilization, Cryotherapy, and Moist heat.  PLAN FOR NEXT SESSION: Cont per Dr. Serena Croissant protocol and MD orders.    Donnel Saxon Samary Shatz, PTA 09/24/2023, 3:16 PM

## 2023-09-29 ENCOUNTER — Other Ambulatory Visit: Payer: Self-pay

## 2023-09-29 MED ORDER — ROSUVASTATIN CALCIUM 40 MG PO TABS: 40.0000 mg | ORAL_TABLET | Freq: Every day | ORAL | 0 refills | Status: DC

## 2023-09-29 NOTE — Therapy (Incomplete)
OUTPATIENT PHYSICAL THERAPY TREATMENT   Patient Name: [name] MRN: [id number] DOB:[birth year], 72 y.o., female Today's Date: 09/29/2023  END OF SESSION:     Past Medical History:  Diagnosis Date   Allergy    Anxiety    Back pain with radiation 07/13/2012   GERD (gastroesophageal reflux disease)    Glaucoma    Hyperlipidemia    Hypertension    Osteoarthritis    PONV (postoperative nausea and vomiting)    Past Surgical History:  Procedure Laterality Date   BREAST CYST EXCISION Right    COLONOSCOPY WITH PROPOFOL N/A 01/02/2022   Procedure: COLONOSCOPY WITH PROPOFOL;  Surgeon: Dolores Frame, MD;  Location: [facility];  Service: Gastroenterology;  Laterality: N/A;  1130   cyst removed from right breast     LAPAROSCOPIC SALPINGO OOPHERECTOMY Right 04/09/2017   Procedure: LAPAROSCOPIC RIGHT SALPINGO OOPHORECTOMY; RIGHT OVARIAN BENIGN CYSTIC TERATOMA;  Surgeon: Lazaro Arms, MD;  Location: [facility];  Service: Gynecology;  Laterality: Right;   PARTIAL HYSTERECTOMY     TUBAL LIGATION     Patient Active Problem List   Diagnosis Date Noted   Tear of right acetabular labrum 09/04/2023   Family history of coronary artery disease 07/18/2023   Pre-op evaluation 07/06/2023   Nonspecific abnormal electrocardiogram (ECG) (EKG) 07/06/2023   Intermittent palpitations 07/06/2023   Syncope and collapse 07/06/2023   Sciatica of right side 02/27/2023   Osteoarthritis of hips, bilateral 09/24/2022   Cervicalgia 09/25/2020   Seasonal allergies 02/18/2020   Depression, major, single episode, in partial remission (HCC) 10/29/2019   Essential hypertension 01/01/2018   Prediabetes 03/30/2015   Preop cardiovascular exam 04/08/2014   Right hip pain 04/06/2014   Hyperlipemia 06/22/2008   GERD (gastroesophageal reflux disease) 05/24/2008   Allergic rhinitis 01/08/2008   OVERACTIVE BLADDER 04/10/2007   GAD (generalized anxiety disorder) 08/22/2006   OSTEOARTHRITIS 08/22/2006     REFERRING PROVIDER:  Huel Cote, MD     REFERRING DIAG:  [phone] (ICD-10-CM) - Tear of right acetabular labrum, initial encounter    s/p Rt hip labral repair  Rationale for Evaluation and Treatment: Rehabilitation  THERAPY DIAG:  No diagnosis found.  ONSET DATE: DOS 11/7   SUBJECTIVE:                                                                                                                                                                                           SUBJECTIVE STATEMENT: Pt Is 3 weeks and 5 days s/p R hip labral thermoshrinkage.  reports 7/10 pain level. "It's been more sore since surgery."  PERTINENT HISTORY:  See PMH  PAIN:  Are you having pain? Yes: NPRS scale: 7/10 Pain location: the whole right leg Pain description: stabbing Aggravating factors: constant Relieving factors: meds  PRECAUTIONS:  None  RED FLAGS: None   WEIGHT BEARING RESTRICTIONS:  Yes WBAT  FALLS:  Has patient fallen in last 6 months? No   PLOF:  Independent  PATIENT GOALS:  Get outside, walk, go shopping  OBJECTIVE:  Note: Objective measures were completed at Evaluation unless otherwise noted.    TREATMENT:                                                                                                                               09/24/23 Gentle PROM R hip Quad sets with 5 sec hold 2x10 reps Hip flexor stretching (LE to neutral in supine. Starting with pillow under LE) STM and IASTM to hip fleoxrs/quads with roler Education on positioning Gait training in hall with FWW- cues for increased step length   09/18/23 Ankle pumps x 20 reps Quad sets with 5 sec hold 2x10 reps Glute sets with 5 sec hold 2x10 reps Bridge 2 x 10  Sidelying mini clam 2 x 10  Ab set 1 x 10 TA activation with bent knee fall outs 2 x 10  Quadruped rocking 2 x 10 Quadruped pelvic tilts  2 x 10   09/11/23 Gait:  Pt educated pt in WBAT orders and gait with FWW.   Adusted walker height and educated pt with appropriate walker height.   Reviewed and performed HEP.   Pt performed: Ankle pumps x 20 reps Prone lying x 2 min Quad sets with 5 sec hold 2x10 reps Glute sets with 5 sec hold 2x10 reps  PT changed bandage.  PT removed old tegaderm and gauze and applied new gauze and tegaderm over portals.      PATIENT EDUCATION:  Education details: Anatomy of condition, POC, HEP, exercise form/rationale, protocol and post op restrictions, WBAT orders, and gait.  Person educated: Patient Education method: Explanation, Demonstration, Tactile cues, Verbal cues, and Handouts Education comprehension: verbalized understanding, returned demonstration, verbal cues required, tactile cues required, and needs further education  HOME EXERCISE PROGRAM: [id number]   ASSESSMENT:  CLINICAL IMPRESSION: Pt will be 3 weeks s/p tomorrow.  Significant guarding present with passive range of motion in all planes, but worse with flexion.  Careful not to exceed pain limitations.  Patient demonstrates significant tightness and hip flexors and quads.  Unable to lay supine with right LE extended fully.  Educated patient on methods to gently stretch this at home to achieve full extension to neutral.  Patient requires cueing with gait training for increased step length.  This did improve significantly following cues. Will continue with FWW until weightbearing tolerance and hip extension improves.  Patient very apprehensive for weightbearing with gait.   Patient to see surgeon for follow-up this afternoon.   REHAB POTENTIAL: Good  CLINICAL DECISION MAKING: Stable/uncomplicated  EVALUATION COMPLEXITY: Low   GOALS: Goals  reviewed with patient? Yes  SHORT TERM GOALS: Target date: 4 weeks 12/7  Pain free flexion to 90 Baseline: Goal status: INITIAL   2.  Demo controlled quad set with hold Baseline:  Goal status: INITIAL   3.  30s sit to stand without pain or  compensation Baseline:  Goal status: INITIAL   4.  SLS 30s without compensation Baseline:  Goal status: INITIAL  LONG TERM GOALS: Able to demo step up on 6" step with level pelvis and proper form Baseline:  Goal status: INITIAL Week 8 1/4  2.  Will tolerate at least 3 min on elliptical, demonstrating good tolerance to repetitive weight bearing motion Baseline:  Goal status: INITIAL  Week 8 11/01/23  3.  Demonstrate proper form in at least 10 continuous lunges without increased pain Baseline:  Goal status: INITIAL  Week 12 11/29/23  4.  Demonstrate gentle, double and single foot plyometric motions with good proximal form Baseline:  Goal status: INITIAL Week 12 11/29/23     PLAN:  PT FREQUENCY: 1-2x/week  PT DURATION: 12 weeks  PLANNED INTERVENTIONS: 97164- PT Re-evaluation, 97110-Therapeutic exercises, 97530- Therapeutic activity, 97112- Neuromuscular re-education, 97535- Self Care, [id number]- Manual therapy, [phone]- Gait training, [phone]- Aquatic Therapy, 97014- Electrical stimulation (unattended), Patient/Family education, Balance training, Stair training, Taping, Dry Needling, Joint mobilization, Spinal mobilization, Scar mobilization, Cryotherapy, and Moist heat.  PLAN FOR NEXT SESSION: Cont per Dr. Serena Croissant protocol and MD orders.    Aaron Edelman, PT 09/29/2023, 10:13 PM

## 2023-09-30 ENCOUNTER — Ambulatory Visit (HOSPITAL_BASED_OUTPATIENT_CLINIC_OR_DEPARTMENT_OTHER): Payer: Medicare Other | Admitting: Physical Therapy

## 2023-10-02 ENCOUNTER — Ambulatory Visit (HOSPITAL_BASED_OUTPATIENT_CLINIC_OR_DEPARTMENT_OTHER): Payer: Medicare Other | Attending: Orthopaedic Surgery | Admitting: Physical Therapy

## 2023-10-02 ENCOUNTER — Encounter (HOSPITAL_BASED_OUTPATIENT_CLINIC_OR_DEPARTMENT_OTHER): Payer: Self-pay | Admitting: Physical Therapy

## 2023-10-02 DIAGNOSIS — M25551 Pain in right hip: Secondary | ICD-10-CM | POA: Diagnosis not present

## 2023-10-02 DIAGNOSIS — R262 Difficulty in walking, not elsewhere classified: Secondary | ICD-10-CM | POA: Insufficient documentation

## 2023-10-02 DIAGNOSIS — M25651 Stiffness of right hip, not elsewhere classified: Secondary | ICD-10-CM | POA: Diagnosis not present

## 2023-10-02 DIAGNOSIS — M6281 Muscle weakness (generalized): Secondary | ICD-10-CM | POA: Diagnosis not present

## 2023-10-02 NOTE — Therapy (Signed)
OUTPATIENT PHYSICAL THERAPY TREATMENT   Patient Name: [name] MRN: [id number] DOB:May 08, 1951, 72 y.o., female Today's Date: 10/02/2023  END OF SESSION:  PT End of Session - 10/02/23 0926     Visit Number 5    Number of Visits 25    Date for PT Re-Evaluation 11/29/23    Authorization Type BCBS MCR- add KX at each visit    Progress Note Due on Visit 10    PT Start Time 0928    PT Stop Time 1008    PT Time Calculation (min) 40 min    Activity Tolerance Patient tolerated treatment well    Behavior During Therapy [facility] for tasks assessed/performed              Past Medical History:  Diagnosis Date   Allergy    Anxiety    Back pain with radiation 07/13/2012   GERD (gastroesophageal reflux disease)    Glaucoma    Hyperlipidemia    Hypertension    Osteoarthritis    PONV (postoperative nausea and vomiting)    Past Surgical History:  Procedure Laterality Date   BREAST CYST EXCISION Right    COLONOSCOPY WITH PROPOFOL N/A 01/02/2022   Procedure: COLONOSCOPY WITH PROPOFOL;  Surgeon: Dolores Frame, MD;  [facility];  Service: Gastroenterology;  Laterality: N/A;  1130   cyst removed from right breast     LAPAROSCOPIC SALPINGO OOPHERECTOMY Right 04/09/2017   Procedure: LAPAROSCOPIC RIGHT SALPINGO OOPHORECTOMY; RIGHT OVARIAN BENIGN CYSTIC TERATOMA;  Surgeon: Lazaro Arms, MD;  Location: [facility];  Service: Gynecology;  Laterality: Right;   PARTIAL HYSTERECTOMY     TUBAL LIGATION     Patient Active Problem List   Diagnosis Date Noted   Tear of right acetabular labrum 09/04/2023   Family history of coronary artery disease 07/18/2023   Pre-op evaluation 07/06/2023   Nonspecific abnormal electrocardiogram (ECG) (EKG) 07/06/2023   Intermittent palpitations 07/06/2023   Syncope and collapse 07/06/2023   Sciatica of right side 02/27/2023   Osteoarthritis of hips, bilateral 09/24/2022   Cervicalgia 09/25/2020   Seasonal allergies 02/18/2020    Depression, major, single episode, in partial remission (HCC) 10/29/2019   Essential hypertension 01/01/2018   Prediabetes 03/30/2015   Preop cardiovascular exam 04/08/2014   Right hip pain 04/06/2014   Hyperlipemia 06/22/2008   GERD (gastroesophageal reflux disease) 05/24/2008   Allergic rhinitis 01/08/2008   OVERACTIVE BLADDER 04/10/2007   GAD (generalized anxiety disorder) 08/22/2006   OSTEOARTHRITIS 08/22/2006    REFERRING PROVIDER:  Huel Cote, MD     REFERRING DIAG:  [phone] (ICD-10-CM) - Tear of right acetabular labrum, initial encounter    s/p Rt hip labral repair  Rationale for Evaluation and Treatment: Rehabilitation  THERAPY DIAG:  Pain in right hip  Muscle weakness (generalized)  Difficulty in walking, not elsewhere classified  ONSET DATE: DOS 11/7   SUBJECTIVE:  SUBJECTIVE STATEMENT: Pt reports walking better. Some days are better than others. Pain in crease of leg. Doing HEP.  PERTINENT HISTORY:  See PMH  PAIN:  Are you having pain? Yes: NPRS scale: 4/10 Pain location: the whole right leg Pain description: stabbing Aggravating factors: constant Relieving factors: meds  PRECAUTIONS:  None  RED FLAGS: None   WEIGHT BEARING RESTRICTIONS:  Yes WBAT  FALLS:  Has patient fallen in last 6 months? No   PLOF:  Independent  PATIENT GOALS:  Get outside, walk, go shopping  OBJECTIVE:  Note: Objective measures were completed at Evaluation unless otherwise noted.    TREATMENT:                                                                                                                              10/02/23 STM to hip flexors/quads Bridge 2 x 10 Prone hamstring curls 2 x 10 Prone hip ER/IR 2 x 10 Prone hip extension 2 x 10 Standing weight shifts  lateral and A/P 1 x 20 each SLS with UE support 3 x 20 second holds  09/24/23 Gentle PROM R hip Quad sets with 5 sec hold 2x10 reps Hip flexor stretching (LE to neutral in supine. Starting with pillow under LE) STM and IASTM to hip fleoxrs/quads with roler Education on positioning Gait training in hall with FWW- cues for increased step length   09/18/23 Ankle pumps x 20 reps Quad sets with 5 sec hold 2x10 reps Glute sets with 5 sec hold 2x10 reps Bridge 2 x 10  Sidelying mini clam 2 x 10  Ab set 1 x 10 TA activation with bent knee fall outs 2 x 10  Quadruped rocking 2 x 10 Quadruped pelvic tilts  2 x 10   09/11/23 Gait:  Pt educated pt in WBAT orders and gait with FWW.  Adusted walker height and educated pt with appropriate walker height.   Reviewed and performed HEP.   Pt performed: Ankle pumps x 20 reps Prone lying x 2 min Quad sets with 5 sec hold 2x10 reps Glute sets with 5 sec hold 2x10 reps  PT changed bandage.  PT removed old tegaderm and gauze and applied new gauze and tegaderm over portals.      PATIENT EDUCATION:  Education details: Anatomy of condition, POC, HEP, exercise form/rationale, protocol and post op restrictions, WBAT orders, and gait.  Person educated: Patient Education method: Explanation, Demonstration, Tactile cues, Verbal cues, and Handouts Education comprehension: verbalized understanding, returned demonstration, verbal cues required, tactile cues required, and needs further education  HOME EXERCISE PROGRAM: [id number]   ASSESSMENT:  CLINICAL IMPRESSION: Patient now 4 weeks post op. Decrease in tissue tension with manual. Patient with R hip in slightly flexed position in supine. Began prone exercises for hip flexor elongation and hip mobility. Cueing provided throughout session for glute activation. Moderate to high fatigue at end of session. Patient will continue to benefit from physical therapy in order to  improve function and reduce  impairment.    REHAB POTENTIAL: Good  CLINICAL DECISION MAKING: Stable/uncomplicated  EVALUATION COMPLEXITY: Low   GOALS: Goals reviewed with patient? Yes  SHORT TERM GOALS: Target date: 4 weeks 12/7  Pain free flexion to 90 Baseline: Goal status: INITIAL   2.  Demo controlled quad set with hold Baseline:  Goal status: INITIAL   3.  30s sit to stand without pain or compensation Baseline:  Goal status: INITIAL   4.  SLS 30s without compensation Baseline:  Goal status: INITIAL  LONG TERM GOALS: Able to demo step up on 6" step with level pelvis and proper form Baseline:  Goal status: INITIAL Week 8 1/4  2.  Will tolerate at least 3 min on elliptical, demonstrating good tolerance to repetitive weight bearing motion Baseline:  Goal status: INITIAL  Week 8 11/01/23  3.  Demonstrate proper form in at least 10 continuous lunges without increased pain Baseline:  Goal status: INITIAL  Week 12 11/29/23  4.  Demonstrate gentle, double and single foot plyometric motions with good proximal form Baseline:  Goal status: INITIAL Week 12 11/29/23     PLAN:  PT FREQUENCY: 1-2x/week  PT DURATION: 12 weeks  PLANNED INTERVENTIONS: 97164- PT Re-evaluation, 97110-Therapeutic exercises, 97530- Therapeutic activity, 97112- Neuromuscular re-education, 97535- Self Care, 16109- Manual therapy, [phone]- Gait training, [phone]- Aquatic Therapy, 97014- Electrical stimulation (unattended), Patient/Family education, Balance training, Stair training, Taping, Dry Needling, Joint mobilization, Spinal mobilization, Scar mobilization, Cryotherapy, and Moist heat.  PLAN FOR NEXT SESSION: Cont per Dr. Serena Croissant protocol and MD orders.    Reola Mosher Leshawn Houseworth, PT 10/02/2023, 9:26 AM

## 2023-10-03 ENCOUNTER — Other Ambulatory Visit: Payer: Self-pay | Admitting: Family Medicine

## 2023-10-07 ENCOUNTER — Encounter (HOSPITAL_BASED_OUTPATIENT_CLINIC_OR_DEPARTMENT_OTHER): Payer: Self-pay | Admitting: Physical Therapy

## 2023-10-07 ENCOUNTER — Ambulatory Visit (HOSPITAL_BASED_OUTPATIENT_CLINIC_OR_DEPARTMENT_OTHER): Payer: Medicare Other | Admitting: Physical Therapy

## 2023-10-07 DIAGNOSIS — M25551 Pain in right hip: Secondary | ICD-10-CM | POA: Diagnosis not present

## 2023-10-07 DIAGNOSIS — R262 Difficulty in walking, not elsewhere classified: Secondary | ICD-10-CM

## 2023-10-07 DIAGNOSIS — M25651 Stiffness of right hip, not elsewhere classified: Secondary | ICD-10-CM | POA: Diagnosis not present

## 2023-10-07 DIAGNOSIS — M6281 Muscle weakness (generalized): Secondary | ICD-10-CM | POA: Diagnosis not present

## 2023-10-07 NOTE — Therapy (Signed)
OUTPATIENT PHYSICAL THERAPY TREATMENT   Patient Name: [name] MRN: [id number] DOB:[birth year], 72 y.o., female Today's Date: 10/08/2023  END OF SESSION:  PT End of Session - 10/07/23 0940     Visit Number 6    Number of Visits 25    Date for PT Re-Evaluation 11/29/23    PT Start Time 0935    PT Stop Time 1018    PT Time Calculation (min) 43 min    Activity Tolerance Patient tolerated treatment well    Behavior During Therapy [facility] for tasks assessed/performed              Past Medical History:  Diagnosis Date   Allergy    Anxiety    Back pain with radiation 07/13/2012   GERD (gastroesophageal reflux disease)    Glaucoma    Hyperlipidemia    Hypertension    Osteoarthritis    PONV (postoperative nausea and vomiting)    Past Surgical History:  Procedure Laterality Date   BREAST CYST EXCISION Right    COLONOSCOPY WITH PROPOFOL N/A 01/02/2022   Procedure: COLONOSCOPY WITH PROPOFOL;  Surgeon: Dolores Frame, MD;  Location: [facility];  Service: Gastroenterology;  Laterality: N/A;  1130   cyst removed from right breast     LAPAROSCOPIC SALPINGO OOPHERECTOMY Right 04/09/2017   Procedure: LAPAROSCOPIC RIGHT SALPINGO OOPHORECTOMY; RIGHT OVARIAN BENIGN CYSTIC TERATOMA;  Surgeon: Lazaro Arms, MD;  Location: [facility];  Service: Gynecology;  Laterality: Right;   PARTIAL HYSTERECTOMY     TUBAL LIGATION     Patient Active Problem List   Diagnosis Date Noted   Tear of right acetabular labrum 09/04/2023   Family history of coronary artery disease 07/18/2023   Pre-op evaluation 07/06/2023   Nonspecific abnormal electrocardiogram (ECG) (EKG) 07/06/2023   Intermittent palpitations 07/06/2023   Syncope and collapse 07/06/2023   Sciatica of right side 02/27/2023   Osteoarthritis of hips, bilateral 09/24/2022   Cervicalgia 09/25/2020   Seasonal allergies 02/18/2020   Depression, major, single episode, in partial remission (HCC) 10/29/2019   Essential  hypertension 01/01/2018   Prediabetes 03/30/2015   Preop cardiovascular exam 04/08/2014   Right hip pain 04/06/2014   Hyperlipemia 06/22/2008   GERD (gastroesophageal reflux disease) 05/24/2008   Allergic rhinitis 01/08/2008   OVERACTIVE BLADDER 04/10/2007   GAD (generalized anxiety disorder) 08/22/2006   OSTEOARTHRITIS 08/22/2006    REFERRING PROVIDER:  Huel Cote, MD     REFERRING DIAG:  [phone] (ICD-10-CM) - Tear of right acetabular labrum, initial encounter    s/p Rt hip labral repair  Rationale for Evaluation and Treatment: Rehabilitation  THERAPY DIAG:  Pain in right hip  Muscle weakness (generalized)  Difficulty in walking, not elsewhere classified  ONSET DATE: DOS 11/7   SUBJECTIVE:  SUBJECTIVE STATEMENT: Pt is 4 weeks and 5 days post op.  Pt denies any increased pain after prior Rx, just tired.  She went home and took a nap after prior Rx.  Pt states she still has groin pain.  Pt reports increased pain when standing up after sitting awhile.  Pt reports she is walking better.  Pt states she has walked a little in her home without her walker, just taking "baby steps".  Pt reports compliance with HEP.  PERTINENT HISTORY:  See PMH  PAIN:  Are you having pain? Yes: NPRS scale: 4/10 Pain location: the whole right leg Pain description: stabbing Aggravating factors: constant Relieving factors: meds  PRECAUTIONS:  None  RED FLAGS: None   WEIGHT BEARING RESTRICTIONS:  Yes WBAT  FALLS:  Has patient fallen in last 6 months? No   PLOF:  Independent  PATIENT GOALS:  Get outside, walk, go shopping  OBJECTIVE:  Note: Objective measures were completed at Evaluation unless otherwise noted.    TREATMENT:                                                                                                                               10/07/23 Reviewed current function, pain level, response to prior Rx, and HEP compliance.  Pt received R hip PROM in flex, abd, and circumduction per pt and tissue tolerance w/n protocol ranges.  Supine bridge 2x10 Supine bent knee fall outs with TrA 2x10 Q-ped rocking 2x10 Prone knee flexion AROM 2x10 Prone hip ER/IR 2x10 with some assistance from PT for form and appropriate ROM Standing wt shifts s/s x 10 reps with bilat UE support on counter  10/02/23 STM to hip flexors/quads Bridge 2 x 10 Prone hamstring curls 2 x 10 Prone hip ER/IR 2 x 10 Prone hip extension 2 x 10 Standing weight shifts lateral and A/P 1 x 20 each SLS with UE support 3 x 20 second holds  09/24/23 Gentle PROM R hip Quad sets with 5 sec hold 2x10 reps Hip flexor stretching (LE to neutral in supine. Starting with pillow under LE) STM and IASTM to hip fleoxrs/quads with roler Education on positioning Gait training in hall with FWW- cues for increased step length   09/18/23 Ankle pumps x 20 reps Quad sets with 5 sec hold 2x10 reps Glute sets with 5 sec hold 2x10 reps Bridge 2 x 10  Sidelying mini clam 2 x 10  Ab set 1 x 10 TA activation with bent knee fall outs 2 x 10  Quadruped rocking 2 x 10 Quadruped pelvic tilts  2 x 10   09/11/23 Gait:  Pt educated pt in WBAT orders and gait with FWW.  Adusted walker height and educated pt with appropriate walker height.   Reviewed and performed HEP.   Pt performed: Ankle pumps x 20 reps Prone lying x 2 min Quad sets with 5 sec hold 2x10 reps Glute sets with 5 sec hold 2x10 reps  PT changed bandage.  PT removed old tegaderm and gauze and applied new gauze and tegaderm over portals.      PATIENT EDUCATION:  Education details: Anatomy of condition, POC, HEP, exercise form/rationale, protocol and post op restrictions, WBAT orders, and gait.  Person educated: Patient Education method:  Explanation, Demonstration, Tactile cues, Verbal cues, and Handouts Education comprehension: verbalized understanding, returned demonstration, verbal cues required, tactile cues required, and needs further education  HOME EXERCISE PROGRAM: [id number]   ASSESSMENT:  CLINICAL IMPRESSION: Pt is guarded with hip PROM and requires cuing to relax R LE with hip PROM.  PT performed hip PROM per pt and tissue tolerance w/n protocol ranges.  Pt performed exercises per protocol with cuing and instruction in correct form.  Pt required assistance with performing hip ER/IR with correct form and appropriate ROM.  Pt responded well to Rx stating she feels better after Rx.  She should continue to benefit from cont skilled PT services per protocol to address impairments and goals and to improve function.   REHAB POTENTIAL: Good  CLINICAL DECISION MAKING: Stable/uncomplicated  EVALUATION COMPLEXITY: Low   GOALS: Goals reviewed with patient? Yes  SHORT TERM GOALS: Target date: 4 weeks 12/7  Pain free flexion to 90 Baseline: Goal status: INITIAL   2.  Demo controlled quad set with hold Baseline:  Goal status: INITIAL   3.  30s sit to stand without pain or compensation Baseline:  Goal status: INITIAL   4.  SLS 30s without compensation Baseline:  Goal status: INITIAL  LONG TERM GOALS: Able to demo step up on 6" step with level pelvis and proper form Baseline:  Goal status: INITIAL Week 8 1/4  2.  Will tolerate at least 3 min on elliptical, demonstrating good tolerance to repetitive weight bearing motion Baseline:  Goal status: INITIAL  Week 8 11/01/23  3.  Demonstrate proper form in at least 10 continuous lunges without increased pain Baseline:  Goal status: INITIAL  Week 12 11/29/23  4.  Demonstrate gentle, double and single foot plyometric motions with good proximal form Baseline:  Goal status: INITIAL Week 12 11/29/23     PLAN:  PT FREQUENCY: 1-2x/week  PT DURATION: 12  weeks  PLANNED INTERVENTIONS: 97164- PT Re-evaluation, 97110-Therapeutic exercises, 97530- Therapeutic activity, 97112- Neuromuscular re-education, 97535- Self Care, 13086- Manual therapy, [phone]- Gait training, [phone]- Aquatic Therapy, 97014- Electrical stimulation (unattended), Patient/Family education, Balance training, Stair training, Taping, Dry Needling, Joint mobilization, Spinal mobilization, Scar mobilization, Cryotherapy, and Moist heat.  PLAN FOR NEXT SESSION: Cont per Dr. Serena Croissant protocol and MD orders.    Audie Clear III PT, DPT 10/08/23 2:30 PM

## 2023-10-09 ENCOUNTER — Ambulatory Visit (HOSPITAL_BASED_OUTPATIENT_CLINIC_OR_DEPARTMENT_OTHER): Payer: Medicare Other | Admitting: Physical Therapy

## 2023-10-09 ENCOUNTER — Encounter (HOSPITAL_BASED_OUTPATIENT_CLINIC_OR_DEPARTMENT_OTHER): Payer: Self-pay | Admitting: Physical Therapy

## 2023-10-09 DIAGNOSIS — M25551 Pain in right hip: Secondary | ICD-10-CM

## 2023-10-09 DIAGNOSIS — R262 Difficulty in walking, not elsewhere classified: Secondary | ICD-10-CM

## 2023-10-09 DIAGNOSIS — M25651 Stiffness of right hip, not elsewhere classified: Secondary | ICD-10-CM

## 2023-10-09 DIAGNOSIS — M6281 Muscle weakness (generalized): Secondary | ICD-10-CM | POA: Diagnosis not present

## 2023-10-09 NOTE — Therapy (Signed)
OUTPATIENT PHYSICAL THERAPY TREATMENT   Patient Name: [name] MRN: [id number] DOB:06/11/1951, 72 y.o., female Today's Date: 10/09/2023  END OF SESSION:  PT End of Session - 10/09/23 0933     Visit Number 7    Number of Visits 25    Date for PT Re-Evaluation 11/29/23    Authorization Type [employer]BS MCR- add KX at each visit    Progress Note Due on Visit 10    PT Start Time 0931    PT Stop Time 1011    PT Time Calculation (min) 40 min    Activity Tolerance Patient tolerated treatment well    Behavior During Therapy [facility] for tasks assessed/performed              Past Medical History:  Diagnosis Date   Allergy    Anxiety    Back pain with radiation 07/13/2012   GERD (gastroesophageal reflux disease)    Glaucoma    Hyperlipidemia    Hypertension    Osteoarthritis    PONV (postoperative nausea and vomiting)    Past Surgical History:  Procedure Laterality Date   BREAST CYST EXCISION Right    COLONOSCOPY WITH PROPOFOL N/A 01/02/2022   Procedure: COLONOSCOPY WITH PROPOFOL;  Surgeon: Dolores Frame, MD;  [facility]: [facility];  Service: Gastroenterology;  Laterality: N/A;  1130   cyst removed from right breast     LAPAROSCOPIC SALPINGO OOPHERECTOMY Right 04/09/2017   Procedure: LAPAROSCOPIC RIGHT SALPINGO OOPHORECTOMY; RIGHT OVARIAN BENIGN CYSTIC TERATOMA;  Surgeon: Lazaro Arms, MD;  Location: [facility];  Service: Gynecology;  Laterality: Right;   PARTIAL HYSTERECTOMY     TUBAL LIGATION     Patient Active Problem List   Diagnosis Date Noted   Tear of right acetabular labrum 09/04/2023   Family history of coronary artery disease 07/18/2023   Pre-op evaluation 07/06/2023   Nonspecific abnormal electrocardiogram (ECG) (EKG) 07/06/2023   Intermittent palpitations 07/06/2023   Syncope and collapse 07/06/2023   Sciatica of right side 02/27/2023   Osteoarthritis of hips, bilateral 09/24/2022   Cervicalgia 09/25/2020   Seasonal allergies 02/18/2020    Depression, major, single episode, in partial remission (HCC) 10/29/2019   Essential hypertension 01/01/2018   Prediabetes 03/30/2015   Preop cardiovascular exam 04/08/2014   Right hip pain 04/06/2014   Hyperlipemia 06/22/2008   GERD (gastroesophageal reflux disease) 05/24/2008   Allergic rhinitis 01/08/2008   OVERACTIVE BLADDER 04/10/2007   GAD (generalized anxiety disorder) 08/22/2006   OSTEOARTHRITIS 08/22/2006    REFERRING PROVIDER:  Huel Cote, MD     REFERRING DIAG:  [phone] (ICD-10-CM) - Tear of right acetabular labrum, initial encounter    s/p Rt hip labral repair  Rationale for Evaluation and Treatment: Rehabilitation  THERAPY DIAG:  Pain in right hip  Muscle weakness (generalized)  Difficulty in walking, not elsewhere classified  Stiffness of right hip, not elsewhere classified  ONSET DATE: DOS 11/7   SUBJECTIVE:  SUBJECTIVE STATEMENT: Pt states hip soreness in groin. HEP going well.   PERTINENT HISTORY:  See PMH  PAIN:  Are you having pain? Yes: NPRS scale: 4/10 Pain location: the whole right leg Pain description: stabbing Aggravating factors: constant Relieving factors: meds  PRECAUTIONS:  None  RED FLAGS: None   WEIGHT BEARING RESTRICTIONS:  Yes WBAT  FALLS:  Has patient fallen in last 6 months? No   PLOF:  Independent  PATIENT GOALS:  Get outside, walk, go shopping  OBJECTIVE:  Note: Objective measures were completed at Evaluation unless otherwise noted.    TREATMENT:                                                                                                                              10/09/23 Manual: STM to hip flexors/quads/adductors Bridge 2 x 10 Prone hamstring curls 2 x 10 Prone hip extension 2 x 10 Prone hip ER/IR 2 x  10 Tall kneeling hip hinge 2 x 10 Tall kneeling with shoulder extension GTB 2 x 10   10/07/23 Reviewed current function, pain level, response to prior Rx, and HEP compliance.  Pt received R hip PROM in flex, abd, and circumduction per pt and tissue tolerance w/n protocol ranges.  Supine bridge 2x10 Supine bent knee fall outs with TrA 2x10 Q-ped rocking 2x10 Prone knee flexion AROM 2x10 Prone hip ER/IR 2x10 with some assistance from PT for form and appropriate ROM Standing wt shifts s/s x 10 reps with bilat UE support on counter  10/02/23 STM to hip flexors/quads Bridge 2 x 10 Prone hamstring curls 2 x 10 Prone hip ER/IR 2 x 10 Prone hip extension 2 x 10 Standing weight shifts lateral and A/P 1 x 20 each SLS with UE support 3 x 20 second holds  09/24/23 Gentle PROM R hip Quad sets with 5 sec hold 2x10 reps Hip flexor stretching (LE to neutral in supine. Starting with pillow under LE) STM and IASTM to hip fleoxrs/quads with roler Education on positioning Gait training in hall with FWW- cues for increased step length   09/18/23 Ankle pumps x 20 reps Quad sets with 5 sec hold 2x10 reps Glute sets with 5 sec hold 2x10 reps Bridge 2 x 10  Sidelying mini clam 2 x 10  Ab set 1 x 10 TA activation with bent knee fall outs 2 x 10  Quadruped rocking 2 x 10 Quadruped pelvic tilts  2 x 10   09/11/23 Gait:  Pt educated pt in WBAT orders and gait with FWW.  Adusted walker height and educated pt with appropriate walker height.   Reviewed and performed HEP.   Pt performed: Ankle pumps x 20 reps Prone lying x 2 min Quad sets with 5 sec hold 2x10 reps Glute sets with 5 sec hold 2x10 reps  PT changed bandage.  PT removed old tegaderm and gauze and applied new gauze and tegaderm over portals.      PATIENT  EDUCATION:  Education details: Anatomy of condition, POC, HEP, exercise form/rationale, protocol and post op restrictions, WBAT orders, and gait.  Person educated:  Patient Education method: Explanation, Demonstration, Tactile cues, Verbal cues, and Handouts Education comprehension: verbalized understanding, returned demonstration, verbal cues required, tactile cues required, and needs further education  HOME EXERCISE PROGRAM: [id number]   ASSESSMENT:  CLINICAL IMPRESSION: Patient with continued hyperactive anterior hip musculature. Completed manual with decrease in tissue tension. Continued with glute strengthening which is tolerated well. Patient provided cueing for mechanics and positioning of exercises performed. Began additional glute strengthening in tall kneeling which is tolerated well. Patient will continue to benefit from physical therapy in order to improve function and reduce impairment.    REHAB POTENTIAL: Good  CLINICAL DECISION MAKING: Stable/uncomplicated  EVALUATION COMPLEXITY: Low   GOALS: Goals reviewed with patient? Yes  SHORT TERM GOALS: Target date: 4 weeks 12/7  Pain free flexion to 90 Baseline: Goal status: INITIAL   2.  Demo controlled quad set with hold Baseline:  Goal status: INITIAL   3.  30s sit to stand without pain or compensation Baseline:  Goal status: INITIAL   4.  SLS 30s without compensation Baseline:  Goal status: INITIAL  LONG TERM GOALS: Able to demo step up on 6" step with level pelvis and proper form Baseline:  Goal status: INITIAL Week 8 1/4  2.  Will tolerate at least 3 min on elliptical, demonstrating good tolerance to repetitive weight bearing motion Baseline:  Goal status: INITIAL  Week 8 11/01/23  3.  Demonstrate proper form in at least 10 continuous lunges without increased pain Baseline:  Goal status: INITIAL  Week 12 11/29/23  4.  Demonstrate gentle, double and single foot plyometric motions with good proximal form Baseline:  Goal status: INITIAL Week 12 11/29/23     PLAN:  PT FREQUENCY: 1-2x/week  PT DURATION: 12 weeks  PLANNED INTERVENTIONS: 97164- PT Re-evaluation,  97110-Therapeutic exercises, 97530- Therapeutic activity, 97112- Neuromuscular re-education, 97535- Self Care, 40981- Manual therapy, [phone]- Gait training, [phone]- Aquatic Therapy, 97014- Electrical stimulation (unattended), Patient/Family education, Balance training, Stair training, Taping, Dry Needling, Joint mobilization, Spinal mobilization, Scar mobilization, Cryotherapy, and Moist heat.  PLAN FOR NEXT SESSION: Cont per Dr. Serena Croissant protocol and MD orders.     Reola Mosher Ladeidra Borys, PT 10/09/2023, 9:34 AM

## 2023-10-13 ENCOUNTER — Other Ambulatory Visit: Payer: Self-pay | Admitting: Family Medicine

## 2023-10-13 DIAGNOSIS — F4323 Adjustment disorder with mixed anxiety and depressed mood: Secondary | ICD-10-CM

## 2023-10-28 ENCOUNTER — Ambulatory Visit (HOSPITAL_BASED_OUTPATIENT_CLINIC_OR_DEPARTMENT_OTHER): Payer: Medicare Other | Admitting: Physical Therapy

## 2023-10-28 ENCOUNTER — Encounter (HOSPITAL_BASED_OUTPATIENT_CLINIC_OR_DEPARTMENT_OTHER): Payer: Self-pay | Admitting: Physical Therapy

## 2023-10-28 DIAGNOSIS — M25551 Pain in right hip: Secondary | ICD-10-CM | POA: Diagnosis not present

## 2023-10-28 DIAGNOSIS — M6281 Muscle weakness (generalized): Secondary | ICD-10-CM

## 2023-10-28 DIAGNOSIS — M25651 Stiffness of right hip, not elsewhere classified: Secondary | ICD-10-CM | POA: Diagnosis not present

## 2023-10-28 DIAGNOSIS — R262 Difficulty in walking, not elsewhere classified: Secondary | ICD-10-CM | POA: Diagnosis not present

## 2023-10-28 NOTE — Therapy (Signed)
 OUTPATIENT PHYSICAL THERAPY TREATMENT   Patient Name: [name] MRN: [id number] DOB:Feb 07, 1951, 72 y.o., female Today's Date: 10/28/2023  END OF SESSION:  PT End of Session - 10/28/23 0719     Visit Number 8    Number of Visits 25    Date for PT Re-Evaluation 11/29/23    Authorization Type BCBS MCR- add KX at each visit    Progress Note Due on Visit 10    PT Start Time 0717    PT Stop Time 0757    PT Time Calculation (min) 40 min    Activity Tolerance Patient tolerated treatment well    Behavior During Therapy [facility] for tasks assessed/performed              Past Medical History:  Diagnosis Date   Allergy    Anxiety    Back pain with radiation 07/13/2012   GERD (gastroesophageal reflux disease)    Glaucoma    Hyperlipidemia    Hypertension    Osteoarthritis    PONV (postoperative nausea and vomiting)    Past Surgical History:  Procedure Laterality Date   BREAST CYST EXCISION Right    COLONOSCOPY WITH PROPOFOL  N/A 01/02/2022   Procedure: COLONOSCOPY WITH PROPOFOL ;  Surgeon: Eartha Angelia Sieving, MD;  Location: [facility];  Service: Gastroenterology;  Laterality: N/A;  1130   cyst removed from right breast     LAPAROSCOPIC SALPINGO OOPHERECTOMY Right 04/09/2017   Procedure: LAPAROSCOPIC RIGHT SALPINGO OOPHORECTOMY; RIGHT OVARIAN BENIGN CYSTIC TERATOMA;  Surgeon: Jayne Vonn DEL, MD;  Location: [facility];  Service: Gynecology;  Laterality: Right;   PARTIAL HYSTERECTOMY     TUBAL LIGATION     Patient Active Problem List   Diagnosis Date Noted   Tear of right acetabular labrum 09/04/2023   Family history of coronary artery disease 07/18/2023   Pre-op evaluation 07/06/2023   Nonspecific abnormal electrocardiogram (ECG) (EKG) 07/06/2023   Intermittent palpitations 07/06/2023   Syncope and collapse 07/06/2023   Sciatica of right side 02/27/2023   Osteoarthritis of hips, bilateral 09/24/2022   Cervicalgia 09/25/2020   Seasonal allergies 02/18/2020    Depression, major, single episode, in partial remission (HCC) 10/29/2019   Essential hypertension 01/01/2018   Prediabetes 03/30/2015   Preop cardiovascular exam 04/08/2014   Right hip pain 04/06/2014   Hyperlipemia 06/22/2008   GERD (gastroesophageal reflux disease) 05/24/2008   Allergic rhinitis 01/08/2008   OVERACTIVE BLADDER 04/10/2007   GAD (generalized anxiety disorder) 08/22/2006   OSTEOARTHRITIS 08/22/2006    REFERRING PROVIDER:  Genelle Standing, MD     REFERRING DIAG:  [phone] (ICD-10-CM) - Tear of right acetabular labrum, initial encounter    s/p Rt hip labral repair  Rationale for Evaluation and Treatment: Rehabilitation  THERAPY DIAG:  Pain in right hip  Muscle weakness (generalized)  Difficulty in walking, not elsewhere classified  Stiffness of right hip, not elsewhere classified  ONSET DATE: DOS 11/7   SUBJECTIVE:  SUBJECTIVE STATEMENT: Pt states hip has been doing well. Walking better.   PERTINENT HISTORY:  See PMH  PAIN:  Are you having pain? Yes: NPRS scale: 1/10 Pain location: the whole right leg Pain description: stabbing Aggravating factors: constant Relieving factors: meds  PRECAUTIONS:  None  RED FLAGS: None   WEIGHT BEARING RESTRICTIONS:  Yes WBAT  FALLS:  Has patient fallen in last 6 months? No   PLOF:  Independent  PATIENT GOALS:  Get outside, walk, go shopping  OBJECTIVE:  Note: Objective measures were completed at Evaluation unless otherwise noted.    TREATMENT:                                                                                                                              10/28/23 Gait with SPC, no AD 200 feet Prone hip extension 2 x 10 Standing hip extension with knee bent 2 x 10 Step up 4 inch 2 x 10  Lateral  step up 4 inch 2 x 10 Standing hip flexor stretch 3 x 30 second holds Lateral stepping 6 x 10 feet bilateral STS 2 x 10   10/09/23 Manual: STM to hip flexors/quads/adductors Bridge 2 x 10 Prone hamstring curls 2 x 10 Prone hip extension 2 x 10 Prone hip ER/IR 2 x 10 Tall kneeling hip hinge 2 x 10 Tall kneeling with shoulder extension GTB 2 x 10   10/07/23 Reviewed current function, pain level, response to prior Rx, and HEP compliance.  Pt received R hip PROM in flex, abd, and circumduction per pt and tissue tolerance w/n protocol ranges.  Supine bridge 2x10 Supine bent knee fall outs with TrA 2x10 Q-ped rocking 2x10 Prone knee flexion AROM 2x10 Prone hip ER/IR 2x10 with some assistance from PT for form and appropriate ROM Standing wt shifts s/s x 10 reps with bilat UE support on counter  10/02/23 STM to hip flexors/quads Bridge 2 x 10 Prone hamstring curls 2 x 10 Prone hip ER/IR 2 x 10 Prone hip extension 2 x 10 Standing weight shifts lateral and A/P 1 x 20 each SLS with UE support 3 x 20 second holds     PATIENT EDUCATION:  Education details: Anatomy of condition, POC, HEP, exercise form/rationale, protocol and post op restrictions, WBAT orders, and gait.  Person educated: Patient Education method: Explanation, Demonstration, Tactile cues, Verbal cues, and Handouts Education comprehension: verbalized understanding, returned demonstration, verbal cues required, tactile cues required, and needs further education  HOME EXERCISE PROGRAM: [id number]   ASSESSMENT:  CLINICAL IMPRESSION: Patient with greatly improved gait mechanics and gait speed demonstrating reduce apprehension. Patient given cueing for use of SPC as she arrives with RW but patient able to ambulate without AD with good gait mechanics but does lack R hip extension. Began functional strengthening which is tolerated well. Some c/o hip flexor symptoms which ease with stretches. Patient will continue to benefit  from physical therapy in order to improve function and reduce  impairment.    REHAB POTENTIAL: Good  CLINICAL DECISION MAKING: Stable/uncomplicated  EVALUATION COMPLEXITY: Low   GOALS: Goals reviewed with patient? Yes  SHORT TERM GOALS: Target date: 4 weeks 12/7  Pain free flexion to 90 Baseline: Goal status: INITIAL   2.  Demo controlled quad set with hold Baseline:  Goal status: INITIAL   3.  30s sit to stand without pain or compensation Baseline:  Goal status: INITIAL   4.  SLS 30s without compensation Baseline:  Goal status: INITIAL  LONG TERM GOALS: Able to demo step up on 6 step with level pelvis and proper form Baseline:  Goal status: INITIAL Week 8 1/4  2.  Will tolerate at least 3 min on elliptical, demonstrating good tolerance to repetitive weight bearing motion Baseline:  Goal status: INITIAL  Week 8 11/01/23  3.  Demonstrate proper form in at least 10 continuous lunges without increased pain Baseline:  Goal status: INITIAL  Week 12 11/29/23  4.  Demonstrate gentle, double and single foot plyometric motions with good proximal form Baseline:  Goal status: INITIAL Week 12 11/29/23     PLAN:  PT FREQUENCY: 1-2x/week  PT DURATION: 12 weeks  PLANNED INTERVENTIONS: 97164- PT Re-evaluation, 97110-Therapeutic exercises, 97530- Therapeutic activity, 97112- Neuromuscular re-education, 97535- Self Care, 02859- Manual therapy, [phone]- Gait training, [phone]- Aquatic Therapy, 97014- Electrical stimulation (unattended), Patient/Family education, Balance training, Stair training, Taping, Dry Needling, Joint mobilization, Spinal mobilization, Scar mobilization, Cryotherapy, and Moist heat.  PLAN FOR NEXT SESSION: Cont per Dr. Danetta protocol and MD orders.     Prentice RAMAN Annetta Deiss, PT 10/28/2023, 7:55 AM

## 2023-10-30 ENCOUNTER — Encounter (HOSPITAL_BASED_OUTPATIENT_CLINIC_OR_DEPARTMENT_OTHER): Payer: Medicare Other | Admitting: Physical Therapy

## 2023-11-03 NOTE — Therapy (Signed)
 OUTPATIENT PHYSICAL THERAPY TREATMENT   Patient Name: [name] MRN: [id number] DOB:1950/12/31, 73 y.o., female Today's Date: 11/04/2023  END OF SESSION:  PT End of Session - 11/04/23 0943     Visit Number 9    Number of Visits 25    Date for PT Re-Evaluation 11/29/23    Authorization Type [employer]BS MCR- add KX at each visit    PT Start Time 0938    PT Stop Time 1020    PT Time Calculation (min) 42 min    Activity Tolerance Patient tolerated treatment well    Behavior During Therapy [facility] for tasks assessed/performed               Past Medical History:  Diagnosis Date   Allergy    Anxiety    Back pain with radiation 07/13/2012   GERD (gastroesophageal reflux disease)    Glaucoma    Hyperlipidemia    Hypertension    Osteoarthritis    PONV (postoperative nausea and vomiting)    Past Surgical History:  Procedure Laterality Date   BREAST CYST EXCISION Right    COLONOSCOPY WITH PROPOFOL  N/A 01/02/2022   Procedure: COLONOSCOPY WITH PROPOFOL ;  Surgeon: Eartha Angelia Sieving, MD;  Location[facility];  Service: Gastroenterology;  Laterality: N/A;  1130   cyst removed from right breast     LAPAROSCOPIC SALPINGO OOPHERECTOMY Right 04/09/2017   Procedure: LAPAROSCOPIC RIGHT SALPINGO OOPHORECTOMY; RIGHT OVARIAN BENIGN CYSTIC TERATOMA;  Surgeon: Jayne Vonn DEL, MD;  Location: [facility];  Service: Gynecology;  Laterality: Right;   PARTIAL HYSTERECTOMY     TUBAL LIGATION     Patient Active Problem List   Diagnosis Date Noted   Tear of right acetabular labrum 09/04/2023   Family history of coronary artery disease 07/18/2023   Pre-op evaluation 07/06/2023   Nonspecific abnormal electrocardiogram (ECG) (EKG) 07/06/2023   Intermittent palpitations 07/06/2023   Syncope and collapse 07/06/2023   Sciatica of right side 02/27/2023   Osteoarthritis of hips, bilateral 09/24/2022   Cervicalgia 09/25/2020   Seasonal allergies 02/18/2020   Depression, major, single episode, in  partial remission (HCC) 10/29/2019   Essential hypertension 01/01/2018   Prediabetes 03/30/2015   Preop cardiovascular exam 04/08/2014   Right hip pain 04/06/2014   Hyperlipemia 06/22/2008   GERD (gastroesophageal reflux disease) 05/24/2008   Allergic rhinitis 01/08/2008   OVERACTIVE BLADDER 04/10/2007   GAD (generalized anxiety disorder) 08/22/2006   OSTEOARTHRITIS 08/22/2006    REFERRING PROVIDER:  Genelle Standing, MD     REFERRING DIAG:  [phone] (ICD-10-CM) - Tear of right acetabular labrum, initial encounter    s/p Rt hip labral repair  Rationale for Evaluation and Treatment: Rehabilitation  THERAPY DIAG:  Pain in right hip  Muscle weakness (generalized)  Difficulty in walking, not elsewhere classified  ONSET DATE: DOS 11/7   SUBJECTIVE:  SUBJECTIVE STATEMENT: Pt is 7 weeks and 2 days post op.  Pt states she felt good and she was tired after prior Rx.  Pt states she can walk better.  Pt states she has had some pain in R thigh the past couple of days.   PERTINENT HISTORY:  See PMH  PAIN:  Are you having pain? Yes: NPRS scale: 3/10 Pain location: R groin Pain description: stabbing Aggravating factors: constant Relieving factors: meds  PRECAUTIONS:  None  RED FLAGS: None   WEIGHT BEARING RESTRICTIONS:  Yes WBAT  FALLS:  Has patient fallen in last 6 months? No   PLOF:  Independent  PATIENT GOALS:  Get outside, walk, go shopping  OBJECTIVE:  Note: Objective measures were completed at Evaluation unless otherwise noted.    TREATMENT:                                                                                                                              11/04/2023  Pt ambulated with SPC.  PT adjusted cane height to the correct height and pt states it felt  better. Pt ambulated without AD 154 ft.  Upright bike x 5 mins lvl 0 Supine bridge 2x10 Prone hip extension 2x10 Prone hip ER/IR x 10 Step ups 2x10 on 4 inch step Sidestepping x10 feet x 3 reps  10/28/23 Gait with SPC, no AD 200 feet Prone hip extension 2 x 10 Standing hip extension with knee bent 2 x 10 Step up 4 inch 2 x 10  Lateral step up 4 inch 2 x 10 Standing hip flexor stretch 3 x 30 second holds Lateral stepping 6 x 10 feet bilateral STS 2 x 10   10/09/23 Manual: STM to hip flexors/quads/adductors Bridge 2 x 10 Prone hamstring curls 2 x 10 Prone hip extension 2 x 10 Prone hip ER/IR 2 x 10 Tall kneeling hip hinge 2 x 10 Tall kneeling with shoulder extension GTB 2 x 10   10/07/23 Reviewed current function, pain level, response to prior Rx, and HEP compliance.  Pt received R hip PROM in flex, abd, and circumduction per pt and tissue tolerance w/n protocol ranges.  Supine bridge 2x10 Supine bent knee fall outs with TrA 2x10 Q-ped rocking 2x10 Prone knee flexion AROM 2x10 Prone hip ER/IR 2x10 with some assistance from PT for form and appropriate ROM Standing wt shifts s/s x 10 reps with bilat UE support on counter  10/02/23 STM to hip flexors/quads Bridge 2 x 10 Prone hamstring curls 2 x 10 Prone hip ER/IR 2 x 10 Prone hip extension 2 x 10 Standing weight shifts lateral and A/P 1 x 20 each SLS with UE support 3 x 20 second holds     PATIENT EDUCATION:  Education details: Anatomy of condition, POC, HEP, exercise form/rationale, protocol and post op restrictions, WBAT orders, and gait.  Person educated: Patient Education method: Explanation, Demonstration, Tactile cues, Verbal cues, and Handouts Education comprehension: verbalized understanding, returned  demonstration, verbal cues required, tactile cues required, and needs further education  HOME EXERCISE PROGRAM: [id number]   ASSESSMENT:  CLINICAL IMPRESSION: Pt presented to Rx with SPC.  SPC was  excessively high and PT adjusted cane to the correct height.  PT had pt ambulate with the [facility] adjusted to correct height and worked on gait.  Pt reports the cane feels better after PT decreased the height.  Pt is improving with gait.  She also ambulated well without SPC having no LOB.  Pt performed exercises per protocol well with cuing and instruction in correct form.  Pt had pain with prone hip ER/IR and PT instructed pt to not perform into a painful range.  Pt is progressing with protocol.  She responded well to Rx stating she felt better after Rx.  Pt should continue to benefit from cont skilled PT to address impairments and ongoing goals and to restore desired level of function.  REHAB POTENTIAL: Good  CLINICAL DECISION MAKING: Stable/uncomplicated  EVALUATION COMPLEXITY: Low   GOALS: Goals reviewed with patient? Yes  SHORT TERM GOALS: Target date: 4 weeks 12/7  Pain free flexion to 90 Baseline: Goal status: INITIAL   2.  Demo controlled quad set with hold Baseline:  Goal status: INITIAL   3.  30s sit to stand without pain or compensation Baseline:  Goal status: INITIAL   4.  SLS 30s without compensation Baseline:  Goal status: INITIAL  LONG TERM GOALS: Able to demo step up on 6 step with level pelvis and proper form Baseline:  Goal status: INITIAL Week 8 1/4  2.  Will tolerate at least 3 min on elliptical, demonstrating good tolerance to repetitive weight bearing motion Baseline:  Goal status: INITIAL  Week 8 11/01/23  3.  Demonstrate proper form in at least 10 continuous lunges without increased pain Baseline:  Goal status: INITIAL  Week 12 11/29/23  4.  Demonstrate gentle, double and single foot plyometric motions with good proximal form Baseline:  Goal status: INITIAL Week 12 11/29/23     PLAN:  PT FREQUENCY: 1-2x/week  PT DURATION: 12 weeks  PLANNED INTERVENTIONS: 97164- PT Re-evaluation, 97110-Therapeutic exercises, 97530- Therapeutic activity, 97112-  Neuromuscular re-education, 97535- Self Care, 02859- Manual therapy, [phone]- Gait training, [phone]- Aquatic Therapy, 97014- Electrical stimulation (unattended), Patient/Family education, Balance training, Stair training, Taping, Dry Needling, Joint mobilization, Spinal mobilization, Scar mobilization, Cryotherapy, and Moist heat.  PLAN FOR NEXT SESSION: Cont per Dr. Danetta protocol and MD orders.  PN next visit.  Leigh Minerva III PT, DPT 11/04/23 4:03 PM

## 2023-11-04 ENCOUNTER — Ambulatory Visit (HOSPITAL_BASED_OUTPATIENT_CLINIC_OR_DEPARTMENT_OTHER): Payer: Medicare Other | Attending: Orthopaedic Surgery | Admitting: Physical Therapy

## 2023-11-04 ENCOUNTER — Encounter (HOSPITAL_BASED_OUTPATIENT_CLINIC_OR_DEPARTMENT_OTHER): Payer: Self-pay | Admitting: Physical Therapy

## 2023-11-04 DIAGNOSIS — M25651 Stiffness of right hip, not elsewhere classified: Secondary | ICD-10-CM | POA: Diagnosis not present

## 2023-11-04 DIAGNOSIS — M6281 Muscle weakness (generalized): Secondary | ICD-10-CM | POA: Diagnosis not present

## 2023-11-04 DIAGNOSIS — M25551 Pain in right hip: Secondary | ICD-10-CM | POA: Diagnosis not present

## 2023-11-04 DIAGNOSIS — R262 Difficulty in walking, not elsewhere classified: Secondary | ICD-10-CM | POA: Diagnosis not present

## 2023-11-06 ENCOUNTER — Encounter (HOSPITAL_BASED_OUTPATIENT_CLINIC_OR_DEPARTMENT_OTHER): Payer: Self-pay | Admitting: Physical Therapy

## 2023-11-06 ENCOUNTER — Ambulatory Visit (HOSPITAL_BASED_OUTPATIENT_CLINIC_OR_DEPARTMENT_OTHER): Payer: Medicare Other | Admitting: Physical Therapy

## 2023-11-06 ENCOUNTER — Other Ambulatory Visit (HOSPITAL_BASED_OUTPATIENT_CLINIC_OR_DEPARTMENT_OTHER): Payer: Self-pay

## 2023-11-06 ENCOUNTER — Ambulatory Visit (INDEPENDENT_AMBULATORY_CARE_PROVIDER_SITE_OTHER): Payer: Medicare Other | Admitting: Orthopaedic Surgery

## 2023-11-06 DIAGNOSIS — M25551 Pain in right hip: Secondary | ICD-10-CM

## 2023-11-06 DIAGNOSIS — M25651 Stiffness of right hip, not elsewhere classified: Secondary | ICD-10-CM | POA: Diagnosis not present

## 2023-11-06 DIAGNOSIS — S73191A Other sprain of right hip, initial encounter: Secondary | ICD-10-CM

## 2023-11-06 DIAGNOSIS — M6281 Muscle weakness (generalized): Secondary | ICD-10-CM

## 2023-11-06 DIAGNOSIS — R262 Difficulty in walking, not elsewhere classified: Secondary | ICD-10-CM | POA: Diagnosis not present

## 2023-11-06 MED ORDER — HYDROCODONE-ACETAMINOPHEN 5-325 MG PO TABS
1.0000 | ORAL_TABLET | Freq: Four times a day (QID) | ORAL | 0 refills | Status: DC | PRN
  Filled 2023-11-06: qty 20, 5d supply, fill #0

## 2023-11-06 NOTE — Progress Notes (Signed)
 Post Operative Evaluation    Procedure/Date of Surgery: Right hip arthroscopy and debridement 11/7  Interval History:   Presents today 6 weeks status post the above procedure.  She is overall doing very well.  She is continues to make improvements.  She is working with physical therapy and strengthening.  She has some days which are worse than others but overall is continuing to improve  PMH/PSH/Family History/Social History/Meds/Allergies:    Past Medical History:  Diagnosis Date   Allergy    Anxiety    Back pain with radiation 07/13/2012   GERD (gastroesophageal reflux disease)    Glaucoma    Hyperlipidemia    Hypertension    Osteoarthritis    PONV (postoperative nausea and vomiting)    Past Surgical History:  Procedure Laterality Date   BREAST CYST EXCISION Right    COLONOSCOPY WITH PROPOFOL  N/A 01/02/2022   Procedure: COLONOSCOPY WITH PROPOFOL ;  Surgeon: Eartha Angelia Sieving, MD;  [facility];  Service: Gastroenterology;  Laterality: N/A;  1130   cyst removed from right breast     LAPAROSCOPIC SALPINGO OOPHERECTOMY Right 04/09/2017   Procedure: LAPAROSCOPIC RIGHT SALPINGO OOPHORECTOMY; RIGHT OVARIAN BENIGN CYSTIC TERATOMA;  Surgeon: Jayne Vonn DEL, MD;  Location: [facility];  Service: Gynecology;  Laterality: Right;   PARTIAL HYSTERECTOMY     TUBAL LIGATION     Social History   Socioeconomic History   Marital status: Single    Spouse name: Not on file   Number of children: 3   Years of education: 12   Highest education level: 12th grade  Occupational History   Not on file  Tobacco Use   Smoking status: Never   Smokeless tobacco: Never  Vaping Use   Vaping status: Never Used  Substance and Sexual Activity   Alcohol use: Not Currently    Comment: occasionally wine   Drug use: No   Sexual activity: Not Currently    Birth control/protection: Surgical    Comment: hyst  Other Topics Concern   Not on file  Social  History Narrative   Not on file   Social Drivers of Health   Financial Resource Strain: Low Risk  (01/17/2023)   Overall Financial Resource Strain (CARDIA)    Difficulty of Paying Living Expenses: Not hard at all  Food Insecurity: No Food Insecurity (01/17/2023)   Hunger Vital Sign    Worried About Running Out of Food in the Last Year: Never true    Ran Out of Food in the Last Year: Never true  Transportation Needs: No Transportation Needs (01/17/2023)   PRAPARE - Administrator, Civil Service (Medical): No    Lack of Transportation (Non-Medical): No  Physical Activity: Sufficiently Active (01/17/2023)   Exercise Vital Sign    Days of Exercise per Week: 7 days    Minutes of Exercise per Session: 60 min  Stress: Stress Concern Present (01/17/2023)   [employer] of Occupational Health - Occupational Stress Questionnaire    Feeling of Stress : To some extent  Social Connections: Unknown (01/17/2023)   Social Connection and Isolation Panel [NHANES]    Frequency of Communication with Friends and Family: More than three times a week    Frequency of Social Gatherings with Friends and Family: More than three times a week    Attends  Religious Services: More than 4 times per year    Active Member of Clubs or Organizations: Yes    Attends Banker Meetings: More than 4 times per year    Marital Status: Patient unable to answer   Family History  Problem Relation Age of Onset   Heart disease Mother    Heart disease Father    Diabetes Father    Hypertension Sister    Parkinson's disease Brother    Heart disease Maternal Grandmother    Cancer Paternal Grandmother        breast   Diabetes Paternal Grandfather    Hypertension Sister    Hypertension Sister    Down syndrome Son    Eczema Son    Post-traumatic stress disorder Daughter    Allergies  Allergen Reactions   Ibuprofen  Palpitations and Other (See Comments)    Rapid heart rate.   Current Outpatient  Medications  Medication Sig Dispense Refill   HYDROcodone -acetaminophen  (NORCO/VICODIN) 5-325 MG tablet Take 1 tablet by mouth every 6 (six) hours as needed for moderate pain (pain score 4-6). 20 tablet 0   amLODipine  (NORVASC ) 5 MG tablet TAKE ONE TABLET BY MOUTH EVERY DAY 30 tablet 0   Ascorbic Acid (VITAMIN C PO) Take 1 tablet by mouth daily. Power C     aspirin  EC 325 MG tablet Take 1 tablet (325 mg total) by mouth daily. 14 tablet 0   citalopram  (CELEXA ) 40 MG tablet Take 1 tablet (40 mg total) by mouth daily. 30 tablet 0   ELDERBERRY PO Take 2 capsules by mouth daily.     fluticasone  (FLONASE ) 50 MCG/ACT nasal spray Place 2 sprays into both nostrils daily. Can do in the morning, or do one spray in more and one at night. 16 g 5   gabapentin  (NEURONTIN ) 300 MG capsule TAKE ONE CAPSULE BY MOUTH AT BEDTIME 90 capsule 0   loratadine  (ALLERGY RELIEF) 10 MG tablet TAKE (1) TABLET BY MOUTH ONCE DAILY AS NEEDED FOR ALLERGIES. 90 tablet 0   Omega-3 Fatty Acids  (FISH OIL  CONCENTRATE PO) Take 2 capsules by mouth daily. 788 mg each     oxybutynin  (DITROPAN ) 5 MG tablet TAKE ONE TABLET BY MOUTH 2 TIMES A DAY 60 tablet 2   oxyCODONE  (ROXICODONE ) 5 MG immediate release tablet Take 1 tablet (5 mg total) by mouth every 4 (four) hours as needed for severe pain (pain score 7-10) or breakthrough pain. 30 tablet 0   pantoprazole  (PROTONIX ) 20 MG tablet TAKE 1 TABLET BY MOUTH ONCE DAILY. 30 tablet 0   rosuvastatin  (CRESTOR ) 40 MG tablet Take 1 tablet (40 mg total) by mouth daily. 90 tablet 0   vitamin B-12 (CYANOCOBALAMIN) 1000 MCG tablet Take 1,000 mcg by mouth daily.     Vitamin E 400 units TABS Take 400 Units by mouth daily.     No current facility-administered medications for this visit.   No results found.  Review of Systems:   A ROS was performed including pertinent positives and negatives as documented in the HPI.   Musculoskeletal Exam:    There were no vitals taken for this visit.  Right hip  incisions are well-appearing without erythema or drainage.  30 degrees internal/external rotation of the right hip.  She has active flexion of 30 degrees 90.  She walks with mildly antalgic gait  Imaging:      I personally reviewed and interpreted the radiographs.   Assessment:   6 weeks status post right hip arthroscopic labral  debridement overall doing well.  She is now fully weightbearing and overall doing extremely well.  She is occasionally using a cane.  Overall she is continuing to impress nicely.  I will plan to see her back in 8 weeks for final check Plan :    -Return to clinic 8 weeks for reassessment      I personally saw and evaluated the patient, and participated in the management and treatment plan.  Elspeth Parker, MD Attending Physician, Orthopedic Surgery  This document was dictated using Dragon voice recognition software. A reasonable attempt at proof reading has been made to minimize errors.

## 2023-11-06 NOTE — Therapy (Signed)
 OUTPATIENT PHYSICAL THERAPY TREATMENT   Progress Note Reporting Period 09/05/2024 to 11/06/2023  See note below for Objective Data and Assessment of Progress/Goals.       Patient Name: [name] MRN: [id number] DOB:May 31, 1951, 73 y.o., female Today's Date: 11/07/2023  END OF SESSION:  PT End of Session - 11/06/23 0949     Visit Number 10    Number of Visits 24    Date for PT Re-Evaluation 12/25/23    PT Start Time 0945    PT Stop Time 1023    PT Time Calculation (min) 38 min    Activity Tolerance Patient tolerated treatment well    Behavior During Therapy [facility] for tasks assessed/performed               Past Medical History:  Diagnosis Date   Allergy    Anxiety    Back pain with radiation 07/13/2012   GERD (gastroesophageal reflux disease)    Glaucoma    Hyperlipidemia    Hypertension    Osteoarthritis    PONV (postoperative nausea and vomiting)    Past Surgical History:  Procedure Laterality Date   BREAST CYST EXCISION Right    COLONOSCOPY WITH PROPOFOL  N/A 01/02/2022   Procedure: COLONOSCOPY WITH PROPOFOL ;  Surgeon: Eartha Angelia Sieving, MD;  Location: [facility];  Service: Gastroenterology;  Laterality: N/A;  1130   cyst removed from right breast     LAPAROSCOPIC SALPINGO OOPHERECTOMY Right 04/09/2017   Procedure: LAPAROSCOPIC RIGHT SALPINGO OOPHORECTOMY; RIGHT OVARIAN BENIGN CYSTIC TERATOMA;  Surgeon: Jayne Vonn DEL, MD;  Location: [facility];  Service: Gynecology;  Laterality: Right;   PARTIAL HYSTERECTOMY     TUBAL LIGATION     Patient Active Problem List   Diagnosis Date Noted   Tear of right acetabular labrum 09/04/2023   Family history of coronary artery disease 07/18/2023   Pre-op evaluation 07/06/2023   Nonspecific abnormal electrocardiogram (ECG) (EKG) 07/06/2023   Intermittent palpitations 07/06/2023   Syncope and collapse 07/06/2023   Sciatica of right side 02/27/2023   Osteoarthritis of hips, bilateral 09/24/2022   Cervicalgia  09/25/2020   Seasonal allergies 02/18/2020   Depression, major, single episode, in partial remission (HCC) 10/29/2019   Essential hypertension 01/01/2018   Prediabetes 03/30/2015   Preop cardiovascular exam 04/08/2014   Right hip pain 04/06/2014   Hyperlipemia 06/22/2008   GERD (gastroesophageal reflux disease) 05/24/2008   Allergic rhinitis 01/08/2008   OVERACTIVE BLADDER 04/10/2007   GAD (generalized anxiety disorder) 08/22/2006   OSTEOARTHRITIS 08/22/2006    REFERRING PROVIDER:  Genelle Standing, MD     REFERRING DIAG:  [id number] (ICD-10-CM) - Tear of right acetabular labrum, initial encounter    s/p Rt hip labral repair  Rationale for Evaluation and Treatment: Rehabilitation  THERAPY DIAG:  Pain in right hip  Muscle weakness (generalized)  Difficulty in walking, not elsewhere classified  Stiffness of right hip, not elsewhere classified  ONSET DATE: DOS 11/7   SUBJECTIVE:  SUBJECTIVE STATEMENT: Pt is 7 weeks and 4 days post op.  Pt states she felt good after prior Rx.  Pt states she hurt yesterday and states I wanted to scream.  Pt saw MD earlier this AM.  She states MD thought she was doing good and to return in 8 weeks FUNCTIONAL IMPROVEMENTS:  pain, gait, ambulation, don/doff socks/shoes FUNCTIONAL LIMITATIONS:  pain, ambulation, prolong sitting  PERTINENT HISTORY:  See PMH  PAIN:  Are you having pain? Yes: NPRS scale: 4/10 Pain location: R groin Pain description: stabbing Aggravating factors: constant Relieving factors: meds  PRECAUTIONS:  None  RED FLAGS: None   WEIGHT BEARING RESTRICTIONS:  Yes WBAT  FALLS:  Has patient fallen in last 6 months? No   PLOF:  Independent  PATIENT GOALS:  Get outside, walk, go shopping  OBJECTIVE:  Note: Objective  measures were completed at Evaluation unless otherwise noted.    TREATMENT:                                                                                                                              11/06/2023  FOTO:  51 (no change from prior).  Goal:  73  Pt received R hip PROM in flexion and abduction. R hip ROM: PROM:  Flexion: 77, Abd:  13 AROM in prone:  ER:  15, IR:  18   Upright bike x 5 mins lvl 0 Supine bridge 2x10 Prone hip ER/IR  approx 15 reps Step ups 4 inch 2x10 Mini squats 2x10 with UE support   11/04/2023  Pt ambulated with SPC.  PT adjusted cane height to the correct height and pt states it felt better. Pt ambulated without AD 154 ft.  Upright bike x 5 mins lvl 0 Supine bridge 2x10 Prone hip extension 2x10 Prone hip ER/IR x 10 Step ups 2x10 on 4 inch step Sidestepping x10 feet x 3 reps  10/28/23 Gait with SPC, no AD 200 feet Prone hip extension 2 x 10 Standing hip extension with knee bent 2 x 10 Step up 4 inch 2 x 10  Lateral step up 4 inch 2 x 10 Standing hip flexor stretch 3 x 30 second holds Lateral stepping 6 x 10 feet bilateral STS 2 x 10   10/09/23 Manual: STM to hip flexors/quads/adductors Bridge 2 x 10 Prone hamstring curls 2 x 10 Prone hip extension 2 x 10 Prone hip ER/IR 2 x 10 Tall kneeling hip hinge 2 x 10 Tall kneeling with shoulder extension GTB 2 x 10   10/07/23 Reviewed current function, pain level, response to prior Rx, and HEP compliance.  Pt received R hip PROM in flex, abd, and circumduction per pt and tissue tolerance w/n protocol ranges.  Supine bridge 2x10 Supine bent knee fall outs with TrA 2x10 Q-ped rocking 2x10 Prone knee flexion AROM 2x10 Prone hip ER/IR 2x10 with some assistance from PT for form and appropriate ROM Standing wt shifts s/s x 10 reps with  bilat UE support on counter  10/02/23 STM to hip flexors/quads Bridge 2 x 10 Prone hamstring curls 2 x 10 Prone hip ER/IR 2 x 10 Prone hip extension 2 x  10 Standing weight shifts lateral and A/P 1 x 20 each SLS with UE support 3 x 20 second holds     PATIENT EDUCATION:  Education details: Anatomy of condition, POC, HEP, exercise form/rationale, protocol and post op restrictions, WBAT orders, and gait.  Person educated: Patient Education method: Explanation, Demonstration, Tactile cues, Verbal cues, and Handouts Education comprehension: verbalized understanding, returned demonstration, verbal cues required, tactile cues required, and needs further education  HOME EXERCISE PROGRAM: [id number]   ASSESSMENT:  CLINICAL IMPRESSION: Pt is progressing with mobility, gait, strength, ROM, and function.  She demonstrates improved gait and is ambulating with SPC.  She is able to ambulate well without SPC without LOB though today required increased usage of cane.  Pt had pain ambulating without cane and required cane.  Pt is progressing with protocol as evidenced by performance and progression of exercises.  Pt is improving with Wb'ing through R LE.  Pt is limited with hip ROM though has improved overall.  Pt had no change in FOTO score.  Pt met STG #2.  She responded well to Rx stating she felt better after Rx having less pain.  Pt should continue to benefit from cont skilled PT to address impairments and ongoing goals and to restore desired level of function.  REHAB POTENTIAL: Good  CLINICAL DECISION MAKING: Stable/uncomplicated  EVALUATION COMPLEXITY: Low   GOALS: Goals reviewed with patient? Yes  SHORT TERM GOALS: Target date: 4 weeks 12/7  Pain free flexion to 90 Baseline: Goal status: ONGOING    2.  Demo controlled quad set with hold Baseline:  Goal status: GOAL MET  1/9  3.  30s sit to stand without pain or compensation Baseline:  Goal status: INITIAL   4.  SLS 30s without compensation Baseline:  Goal status: INITIAL   5.  Pt will ambulate community distance with good stability without AD.   Goal status:  INITIAL  Target  date:  12/04/2023   LONG TERM GOALS:  12/25/2023 Able to demo step up on 6 step with level pelvis and proper form Baseline:  Goal status: PROGRESSING  1/9-- Week 8 1/4  2.  Will tolerate at least 3 min on elliptical, demonstrating good tolerance to repetitive weight bearing motion Baseline:  Goal status: INITIAL  Week 8 11/01/23  3.  Demonstrate proper form in at least 10 continuous lunges without increased pain Baseline:  Goal status: INITIAL  Week 12 11/29/23  4.  Demonstrate gentle, double and single foot plyometric motions with good proximal form Baseline:  Goal status: INITIAL Week 12 11/29/23  5.  Pt will be able to perform stairs with a reciprocal gait with rail.   Goal status:  INITIAL  6.  Pt will be able to perform her ADLs and functional mobility skills without significant pain and difficulty.   Goals status:  INITIAL     PLAN:  PT FREQUENCY: 2x/wk  PT DURATION: 6-7 weeks  PLANNED INTERVENTIONS: 97164- PT Re-evaluation, 97110-Therapeutic exercises, 97530- Therapeutic activity, 97112- Neuromuscular re-education, 97535- Self Care, 02859- Manual therapy, [phone]- Gait training, [phone]- Aquatic Therapy, [phone]- Electrical stimulation (unattended), Patient/Family education, Balance training, Stair training, Taping, Dry Needling, Joint mobilization, Spinal mobilization, Scar mobilization, Cryotherapy, and Moist heat.  PLAN FOR NEXT SESSION: Cont per Dr. Danetta protocol and MD orders.    Marasia Newhall  Margrette III PT, DPT 11/07/23 12:07 PM

## 2023-11-11 ENCOUNTER — Encounter (HOSPITAL_BASED_OUTPATIENT_CLINIC_OR_DEPARTMENT_OTHER): Payer: Self-pay

## 2023-11-11 ENCOUNTER — Ambulatory Visit (HOSPITAL_BASED_OUTPATIENT_CLINIC_OR_DEPARTMENT_OTHER): Payer: Medicare Other

## 2023-11-11 DIAGNOSIS — R262 Difficulty in walking, not elsewhere classified: Secondary | ICD-10-CM | POA: Diagnosis not present

## 2023-11-11 DIAGNOSIS — M25551 Pain in right hip: Secondary | ICD-10-CM

## 2023-11-11 DIAGNOSIS — M25651 Stiffness of right hip, not elsewhere classified: Secondary | ICD-10-CM | POA: Diagnosis not present

## 2023-11-11 DIAGNOSIS — M6281 Muscle weakness (generalized): Secondary | ICD-10-CM | POA: Diagnosis not present

## 2023-11-11 NOTE — Therapy (Signed)
 OUTPATIENT PHYSICAL THERAPY TREATMENT       Patient Name: [name] MRN: [id number] DOB:12/25/1950, 73 y.o., female Today's Date: 11/11/2023  END OF SESSION:  PT End of Session - 11/11/23 0945     Visit Number 11    Number of Visits 24    Date for PT Re-Evaluation 12/25/23    Authorization Type [employer] MCR- add KX at each visit    Progress Note Due on Visit 20    PT Start Time 0937    PT Stop Time 1015    PT Time Calculation (min) 38 min    Activity Tolerance Patient tolerated treatment well    Behavior During Therapy [facility] for tasks assessed/performed                Past Medical History:  Diagnosis Date   Allergy    Anxiety    Back pain with radiation 07/13/2012   GERD (gastroesophageal reflux disease)    Glaucoma    Hyperlipidemia    Hypertension    Osteoarthritis    PONV (postoperative nausea and vomiting)    Past Surgical History:  Procedure Laterality Date   BREAST CYST EXCISION Right    COLONOSCOPY WITH PROPOFOL  N/A 01/02/2022   Procedure: COLONOSCOPY WITH PROPOFOL ;  Surgeon: Eartha Angelia Sieving, MD;  Location: [facility];  Service: Gastroenterology;  Laterality: N/A;  1130   cyst removed from right breast     LAPAROSCOPIC SALPINGO OOPHERECTOMY Right 04/09/2017   Procedure: LAPAROSCOPIC RIGHT SALPINGO OOPHORECTOMY; RIGHT OVARIAN BENIGN CYSTIC TERATOMA;  Surgeon: Jayne Vonn DEL, MD;  Location: [facility];  Service: Gynecology;  Laterality: Right;   PARTIAL HYSTERECTOMY     TUBAL LIGATION     Patient Active Problem List   Diagnosis Date Noted   Tear of right acetabular labrum 09/04/2023   Family history of coronary artery disease 07/18/2023   Pre-op evaluation 07/06/2023   Nonspecific abnormal electrocardiogram (ECG) (EKG) 07/06/2023   Intermittent palpitations 07/06/2023   Syncope and collapse 07/06/2023   Sciatica of right side 02/27/2023   Osteoarthritis of hips, bilateral 09/24/2022   Cervicalgia 09/25/2020   Seasonal allergies 02/18/2020    Depression, major, single episode, in partial remission (HCC) 10/29/2019   Essential hypertension 01/01/2018   Prediabetes 03/30/2015   Preop cardiovascular exam 04/08/2014   Right hip pain 04/06/2014   Hyperlipemia 06/22/2008   GERD (gastroesophageal reflux disease) 05/24/2008   Allergic rhinitis 01/08/2008   OVERACTIVE BLADDER 04/10/2007   GAD (generalized anxiety disorder) 08/22/2006   OSTEOARTHRITIS 08/22/2006    REFERRING PROVIDER:  Genelle Standing, MD     REFERRING DIAG:  [phone] (ICD-10-CM) - Tear of right acetabular labrum, initial encounter    s/p Rt hip labral repair  Rationale for Evaluation and Treatment: Rehabilitation  THERAPY DIAG:  Muscle weakness (generalized)  Pain in right hip  Difficulty in walking, not elsewhere classified  ONSET DATE: DOS 11/7   SUBJECTIVE:  SUBJECTIVE STATEMENT: Pt reports 5/10 pain level this morning. I went up and down some steps today.  PERTINENT HISTORY:  See PMH  PAIN:  Are you having pain? Yes: NPRS scale: 5/10 Pain location: R groin Pain description: stabbing Aggravating factors: constant Relieving factors: meds  PRECAUTIONS:  None  RED FLAGS: None   WEIGHT BEARING RESTRICTIONS:  Yes WBAT  FALLS:  Has patient fallen in last 6 months? No   PLOF:  Independent  PATIENT GOALS:  Get outside, walk, go shopping  OBJECTIVE:  Note: Objective measures were completed at Evaluation unless otherwise noted.    TREATMENT:                                                                                                                               11/11/2023 Bike (trialled but painful) PROM R hip Prone hip IR/ER 2x10ea Minisquats x10 (rail use) Standing HR 2x20 Standing marching x20 (single UE  support)   11/06/2023  FOTO:  51 (no change from prior).  Goal:  73  Pt received R hip PROM in flexion and abduction. R hip ROM: PROM:  Flexion: 77, Abd:  13 AROM in prone:  ER:  15, IR:  18   Upright bike x 5 mins lvl 0 Supine bridge 2x10 Prone hip ER/IR  approx 15 reps Step ups 4 inch 2x10 Mini squats 2x10 with UE support   11/04/2023  Pt ambulated with SPC.  PT adjusted cane height to the correct height and pt states it felt better. Pt ambulated without AD 154 ft.  Upright bike x 5 mins lvl 0 Supine bridge 2x10 Prone hip extension 2x10 Prone hip ER/IR x 10 Step ups 2x10 on 4 inch step Sidestepping x10 feet x 3 reps  10/28/23 Gait with SPC, no AD 200 feet Prone hip extension 2 x 10 Standing hip extension with knee bent 2 x 10 Step up 4 inch 2 x 10  Lateral step up 4 inch 2 x 10 Standing hip flexor stretch 3 x 30 second holds Lateral stepping 6 x 10 feet bilateral STS 2 x 10      PATIENT EDUCATION:  Education details: Anatomy of condition, POC, HEP, exercise form/rationale, protocol and post op restrictions, WBAT orders, and gait.  Person educated: Patient Education method: Explanation, Demonstration, Tactile cues, Verbal cues, and Handouts Education comprehension: verbalized understanding, returned demonstration, verbal cues required, tactile cues required, and needs further education  HOME EXERCISE PROGRAM: [id number]   ASSESSMENT:  CLINICAL IMPRESSION: Trialled recumbent bike today, though some discomfort present so stopped. Pt remains limited with hip ROM and requires cuing to decrease guarding. No pain present with PROM and pt reported improved pain level following. With mini squats at rail, she did experience instance of RLE buckling. She was able to correct this with rail use and minA from clinician. She requested to try again and was instructed to perform in decreased range while closely monitored. Pt requested to try  bike again at end of session, but  clinician disagreed due to her increased overall pain lvel today and instability present. Will continue to monitor her RLE for excessive weakness in WB.  REHAB POTENTIAL: Good  CLINICAL DECISION MAKING: Stable/uncomplicated  EVALUATION COMPLEXITY: Low   GOALS: Goals reviewed with patient? Yes  SHORT TERM GOALS: Target date: 4 weeks 12/7  Pain free flexion to 90 Baseline: Goal status: ONGOING    2.  Demo controlled quad set with hold Baseline:  Goal status: GOAL MET  1/9  3.  30s sit to stand without pain or compensation Baseline:  Goal status: INITIAL   4.  SLS 30s without compensation Baseline:  Goal status: INITIAL   5.  Pt will ambulate community distance with good stability without AD.   Goal status:  INITIAL  Target date:  12/04/2023   LONG TERM GOALS:  12/25/2023 Able to demo step up on 6 step with level pelvis and proper form Baseline:  Goal status: PROGRESSING  1/9-- Week 8 1/4  2.  Will tolerate at least 3 min on elliptical, demonstrating good tolerance to repetitive weight bearing motion Baseline:  Goal status: INITIAL  Week 8 11/01/23  3.  Demonstrate proper form in at least 10 continuous lunges without increased pain Baseline:  Goal status: INITIAL  Week 12 11/29/23  4.  Demonstrate gentle, double and single foot plyometric motions with good proximal form Baseline:  Goal status: INITIAL Week 12 11/29/23  5.  Pt will be able to perform stairs with a reciprocal gait with rail.   Goal status:  INITIAL  6.  Pt will be able to perform her ADLs and functional mobility skills without significant pain and difficulty.   Goals status:  INITIAL     PLAN:  PT FREQUENCY: 2x/wk  PT DURATION: 6-7 weeks  PLANNED INTERVENTIONS: 97164- PT Re-evaluation, 97110-Therapeutic exercises, 97530- Therapeutic activity, 97112- Neuromuscular re-education, 97535- Self Care, 02859- Manual therapy, [phone]- Gait training, [phone]- Aquatic Therapy, [phone]- Electrical stimulation  (unattended), Patient/Family education, Balance training, Stair training, Taping, Dry Needling, Joint mobilization, Spinal mobilization, Scar mobilization, Cryotherapy, and Moist heat.  PLAN FOR NEXT SESSION: Cont per Dr. Danetta protocol and MD orders.    Asberry Rodes, PTA  11/11/23 1:29 PM

## 2023-11-12 ENCOUNTER — Other Ambulatory Visit: Payer: Self-pay | Admitting: Family Medicine

## 2023-11-13 ENCOUNTER — Encounter (HOSPITAL_BASED_OUTPATIENT_CLINIC_OR_DEPARTMENT_OTHER): Payer: Self-pay

## 2023-11-13 ENCOUNTER — Other Ambulatory Visit: Payer: Self-pay | Admitting: Family Medicine

## 2023-11-13 ENCOUNTER — Ambulatory Visit (HOSPITAL_BASED_OUTPATIENT_CLINIC_OR_DEPARTMENT_OTHER): Payer: Medicare Other

## 2023-11-13 DIAGNOSIS — M25651 Stiffness of right hip, not elsewhere classified: Secondary | ICD-10-CM | POA: Diagnosis not present

## 2023-11-13 DIAGNOSIS — M25551 Pain in right hip: Secondary | ICD-10-CM | POA: Diagnosis not present

## 2023-11-13 DIAGNOSIS — R262 Difficulty in walking, not elsewhere classified: Secondary | ICD-10-CM

## 2023-11-13 DIAGNOSIS — M6281 Muscle weakness (generalized): Secondary | ICD-10-CM

## 2023-11-13 NOTE — Therapy (Signed)
OUTPATIENT PHYSICAL THERAPY TREATMENT       Patient Name: [name] MRN: [id number] DOB:1951-07-20, 73 y.o., female Today's Date: 11/13/2023  END OF SESSION:  PT End of Session - 11/13/23 0932     Visit Number 12    Number of Visits 24    Date for PT Re-Evaluation 12/25/23    Authorization Type [employer] MCR- add KX at each visit    Progress Note Due on Visit 20    PT Start Time 0932    PT Stop Time 1013    PT Time Calculation (min) 41 min    Equipment Utilized During Treatment Gait belt    Activity Tolerance Patient tolerated treatment well    Behavior During Therapy WFL for tasks assessed/performed                 Past Medical History:  Diagnosis Date   Allergy    Anxiety    Back pain with radiation 07/13/2012   GERD (gastroesophageal reflux disease)    Glaucoma    Hyperlipidemia    Hypertension    Osteoarthritis    PONV (postoperative nausea and vomiting)    Past Surgical History:  Procedure Laterality Date   BREAST CYST EXCISION Right    COLONOSCOPY WITH PROPOFOL N/A 01/02/2022   Procedure: COLONOSCOPY WITH PROPOFOL;  Surgeon: Dolores Frame, MD;  Location: [facility];  Service: Gastroenterology;  Laterality: N/A;  1130   cyst removed from right breast     LAPAROSCOPIC SALPINGO OOPHERECTOMY Right 04/09/2017   Procedure: LAPAROSCOPIC RIGHT SALPINGO OOPHORECTOMY; RIGHT OVARIAN BENIGN CYSTIC TERATOMA;  Surgeon: Lazaro Arms, MD;  Location: [facility];  Service: Gynecology;  Laterality: Right;   PARTIAL HYSTERECTOMY     TUBAL LIGATION     Patient Active Problem List   Diagnosis Date Noted   Tear of right acetabular labrum 09/04/2023   Family history of coronary artery disease 07/18/2023   Pre-op evaluation 07/06/2023   Nonspecific abnormal electrocardiogram (ECG) (EKG) 07/06/2023   Intermittent palpitations 07/06/2023   Syncope and collapse 07/06/2023   Sciatica of right side 02/27/2023   Osteoarthritis of hips, bilateral 09/24/2022    Cervicalgia 09/25/2020   Seasonal allergies 02/18/2020   Depression, major, single episode, in partial remission (HCC) 10/29/2019   Essential hypertension 01/01/2018   Prediabetes 03/30/2015   Preop cardiovascular exam 04/08/2014   Right hip pain 04/06/2014   Hyperlipemia 06/22/2008   GERD (gastroesophageal reflux disease) 05/24/2008   Allergic rhinitis 01/08/2008   OVERACTIVE BLADDER 04/10/2007   GAD (generalized anxiety disorder) 08/22/2006   OSTEOARTHRITIS 08/22/2006    REFERRING PROVIDER:  Huel Cote, MD     REFERRING DIAG:  [phone] (ICD-10-CM) - Tear of right acetabular labrum, initial encounter    s/p Rt hip labral repair  Rationale for Evaluation and Treatment: Rehabilitation  THERAPY DIAG:  Muscle weakness (generalized)  Pain in right hip  Difficulty in walking, not elsewhere classified  ONSET DATE: DOS 11/7   SUBJECTIVE:  SUBJECTIVE STATEMENT: Pt reports improved pain level. "It's been going down." Pt reports she has been trying not to do too much at home."  PERTINENT HISTORY:  See PMH  PAIN:  Are you having pain? Yes: NPRS scale: 4/10 Pain location: R groin Pain description: stabbing Aggravating factors: constant Relieving factors: meds  PRECAUTIONS:  None  RED FLAGS: None   WEIGHT BEARING RESTRICTIONS:  Yes WBAT  FALLS:  Has patient fallen in last 6 months? No   PLOF:  Independent  PATIENT GOALS:  Get outside, walk, go shopping  OBJECTIVE:  Note: Objective measures were completed at Evaluation unless otherwise noted.    TREATMENT:                                                                                                                               11/13/2023 PROM R hip Prone hip IR/ER 2x10ea Standing HR 2x20 Sit to stands from  elevated plinth 2x10 Standing marching 2x10 (single UE support) Step ups 4" 2x10 Gait training no AD ~248ft  11/11/2023 Bike (trialled but painful) PROM R hip Prone hip IR/ER 2x10ea Minisquats x10 (rail use) Standing HR 2x20 Standing marching x20 (single UE support)   11/06/2023  FOTO:  51 (no change from prior).  Goal:  73  Pt received R hip PROM in flexion and abduction. R hip ROM: PROM:  Flexion: 77, Abd:  13 AROM in prone:  ER:  15, IR:  18   Upright bike x 5 mins lvl 0 Supine bridge 2x10 Prone hip ER/IR  approx 15 reps Step ups 4 inch 2x10 Mini squats 2x10 with UE support   11/04/2023  Pt ambulated with SPC.  PT adjusted cane height to the correct height and pt states it felt better. Pt ambulated without AD 154 ft.  Upright bike x 5 mins lvl 0 Supine bridge 2x10 Prone hip extension 2x10 Prone hip ER/IR x 10 Step ups 2x10 on 4 inch step Sidestepping x10 feet x 3 reps    PATIENT EDUCATION:  Education details: Anatomy of condition, POC, HEP, exercise form/rationale, protocol and post op restrictions, WBAT orders, and gait.  Person educated: Patient Education method: Explanation, Demonstration, Tactile cues, Verbal cues, and Handouts Education comprehension: verbalized understanding, returned demonstration, verbal cues required, tactile cues required, and needs further education  HOME EXERCISE PROGRAM: [id number]   ASSESSMENT:  CLINICAL IMPRESSION: Pt remains limited in hip PROM due to tightness and self guarding. Good tolerance for strengthening tasks today and was able to complete step ups at 4" step without complaint. Utilized gait belt with gait training due to pt's unsteadiness and unpredictability with balance. She was able to ambulate ~260 ft without SPC, though by the last 45ft she did require 2 standing rest breaks in order to regain her balance. Instructed pt continue with cane use when out of the house as a precaution. Will continue to monitor pt's  steadiness as we progress with PT.   REHAB  POTENTIAL: Good  CLINICAL DECISION MAKING: Stable/uncomplicated  EVALUATION COMPLEXITY: Low   GOALS: Goals reviewed with patient? Yes  SHORT TERM GOALS: Target date: 4 weeks 12/7  Pain free flexion to 90 Baseline: Goal status: ONGOING    2.  Demo controlled quad set with hold Baseline:  Goal status: GOAL MET  1/9  3.  30s sit to stand without pain or compensation Baseline:  Goal status: INITIAL   4.  SLS 30s without compensation Baseline:  Goal status: INITIAL   5.  Pt will ambulate community distance with good stability without AD.   Goal status:  INITIAL  Target date:  12/04/2023   LONG TERM GOALS:  12/25/2023 Able to demo step up on 6" step with level pelvis and proper form Baseline:  Goal status: PROGRESSING  1/9-- Week 8 1/4  2.  Will tolerate at least 3 min on elliptical, demonstrating good tolerance to repetitive weight bearing motion Baseline:  Goal status: INITIAL  Week 8 11/01/23  3.  Demonstrate proper form in at least 10 continuous lunges without increased pain Baseline:  Goal status: INITIAL  Week 12 11/29/23  4.  Demonstrate gentle, double and single foot plyometric motions with good proximal form Baseline:  Goal status: INITIAL Week 12 11/29/23  5.  Pt will be able to perform stairs with a reciprocal gait with rail.   Goal status:  INITIAL  6.  Pt will be able to perform her ADLs and functional mobility skills without significant pain and difficulty.   Goals status:  INITIAL     PLAN:  PT FREQUENCY: 2x/wk  PT DURATION: 6-7 weeks  PLANNED INTERVENTIONS: 97164- PT Re-evaluation, 97110-Therapeutic exercises, 97530- Therapeutic activity, 97112- Neuromuscular re-education, 97535- Self Care, 16109- Manual therapy, [phone]- Gait training, [phone]- Aquatic Therapy, [phone]- Electrical stimulation (unattended), Patient/Family education, Balance training, Stair training, Taping, Dry Needling, Joint mobilization,  Spinal mobilization, Scar mobilization, Cryotherapy, and Moist heat.  PLAN FOR NEXT SESSION: Cont per Dr. Serena Croissant protocol and MD orders.    Riki Altes, PTA  11/13/23 12:07 PM

## 2023-11-18 ENCOUNTER — Encounter (HOSPITAL_BASED_OUTPATIENT_CLINIC_OR_DEPARTMENT_OTHER): Payer: Self-pay | Admitting: Physical Therapy

## 2023-11-18 ENCOUNTER — Ambulatory Visit (HOSPITAL_BASED_OUTPATIENT_CLINIC_OR_DEPARTMENT_OTHER): Payer: Medicare Other | Admitting: Physical Therapy

## 2023-11-18 DIAGNOSIS — M25651 Stiffness of right hip, not elsewhere classified: Secondary | ICD-10-CM | POA: Diagnosis not present

## 2023-11-18 DIAGNOSIS — R262 Difficulty in walking, not elsewhere classified: Secondary | ICD-10-CM | POA: Diagnosis not present

## 2023-11-18 DIAGNOSIS — M6281 Muscle weakness (generalized): Secondary | ICD-10-CM

## 2023-11-18 DIAGNOSIS — M25551 Pain in right hip: Secondary | ICD-10-CM

## 2023-11-18 NOTE — Therapy (Signed)
OUTPATIENT PHYSICAL THERAPY TREATMENT       Patient Name: [name] MRN: [id number] DOB:11-01-1950, 73 y.o., female Today's Date: 11/19/2023  END OF SESSION:  PT End of Session - 11/18/23 0950     Visit Number 13    Number of Visits 24    Date for PT Re-Evaluation 12/25/23    Progress Note Due on Visit 20    PT Start Time 0948    PT Stop Time 1026    PT Time Calculation (min) 38 min    Activity Tolerance Patient tolerated treatment well    Behavior During Therapy [facility] for tasks assessed/performed                 Past Medical History:  Diagnosis Date   Allergy    Anxiety    Back pain with radiation 07/13/2012   GERD (gastroesophageal reflux disease)    Glaucoma    Hyperlipidemia    Hypertension    Osteoarthritis    PONV (postoperative nausea and vomiting)    Past Surgical History:  Procedure Laterality Date   BREAST CYST EXCISION Right    COLONOSCOPY WITH PROPOFOL N/A 01/02/2022   Procedure: COLONOSCOPY WITH PROPOFOL;  Surgeon: Dolores Frame, MD;  Location: [facility];  Service: Gastroenterology;  Laterality: N/A;  1130   cyst removed from right breast     LAPAROSCOPIC SALPINGO OOPHERECTOMY Right 04/09/2017   Procedure: LAPAROSCOPIC RIGHT SALPINGO OOPHORECTOMY; RIGHT OVARIAN BENIGN CYSTIC TERATOMA;  Surgeon: Lazaro Arms, MD;  Location: [facility];  Service: Gynecology;  Laterality: Right;   PARTIAL HYSTERECTOMY     TUBAL LIGATION     Patient Active Problem List   Diagnosis Date Noted   Tear of right acetabular labrum 09/04/2023   Family history of coronary artery disease 07/18/2023   Pre-op evaluation 07/06/2023   Nonspecific abnormal electrocardiogram (ECG) (EKG) 07/06/2023   Intermittent palpitations 07/06/2023   Syncope and collapse 07/06/2023   Sciatica of right side 02/27/2023   Osteoarthritis of hips, bilateral 09/24/2022   Cervicalgia 09/25/2020   Seasonal allergies 02/18/2020   Depression, major, single episode, in partial  remission (HCC) 10/29/2019   Essential hypertension 01/01/2018   Prediabetes 03/30/2015   Preop cardiovascular exam 04/08/2014   Right hip pain 04/06/2014   Hyperlipemia 06/22/2008   GERD (gastroesophageal reflux disease) 05/24/2008   Allergic rhinitis 01/08/2008   OVERACTIVE BLADDER 04/10/2007   GAD (generalized anxiety disorder) 08/22/2006   OSTEOARTHRITIS 08/22/2006    REFERRING PROVIDER:  Huel Cote, MD     REFERRING DIAG:  [phone] (ICD-10-CM) - Tear of right acetabular labrum, initial encounter    s/p Rt hip labral repair  Rationale for Evaluation and Treatment: Rehabilitation  THERAPY DIAG:  Muscle weakness (generalized)  Pain in right hip  Difficulty in walking, not elsewhere classified  Stiffness of right hip, not elsewhere classified  ONSET DATE: DOS 11/7   SUBJECTIVE:  SUBJECTIVE STATEMENT: Pt is 10 weeks and 5 days post op.  Pt denies any adverse effects after prior Rx.  Pt states she did well taking some steps in her home without cane this AM.   PERTINENT HISTORY:  See PMH  PAIN:  Are you having pain? Yes: NPRS scale: 3/10 Pain location: R groin Pain description: stabbing Aggravating factors: constant Relieving factors: meds  PRECAUTIONS:  None  RED FLAGS: None   WEIGHT BEARING RESTRICTIONS:  Yes WBAT  FALLS:  Has patient fallen in last 6 months? No   PLOF:  Independent  PATIENT GOALS:  Get outside, walk, go shopping  OBJECTIVE:  Note: Objective measures were completed at Evaluation unless otherwise noted.    TREATMENT:                                                                                                                               11/18/2023 Upright bike lvl 0 x 6 mins Pt attempted mini squats though stopped due to  pain Supine bridge 3x10 S/L clams 2x10 Standing heel raises 2x10 Mini squats 2x10 Step ups 4 inch step 2x10 with UE support on rail Sidestepping at rail with bilat UE support on rail x 2 laps  Pt received R hip PROM in flex, ER, and IR in supine per pt tolerance and tissue tolerance    11/13/2023 PROM R hip Prone hip IR/ER 2x10ea Standing HR 2x20 Sit to stands from elevated plinth 2x10 Standing marching 2x10 (single UE support) Step ups 4" 2x10 Gait training no AD ~295ft  11/11/2023 Bike (trialled but painful) PROM R hip Prone hip IR/ER 2x10ea Minisquats x10 (rail use) Standing HR 2x20 Standing marching x20 (single UE support)   11/06/2023  FOTO:  51 (no change from prior).  Goal:  73  Pt received R hip PROM in flexion and abduction. R hip ROM: PROM:  Flexion: 77, Abd:  13 AROM in prone:  ER:  15, IR:  18   Upright bike x 5 mins lvl 0 Supine bridge 2x10 Prone hip ER/IR  approx 15 reps Step ups 4 inch 2x10 Mini squats 2x10 with UE support   11/04/2023  Pt ambulated with SPC.  PT adjusted cane height to the correct height and pt states it felt better. Pt ambulated without AD 154 ft.  Upright bike x 5 mins lvl 0 Supine bridge 2x10 Prone hip extension 2x10 Prone hip ER/IR x 10 Step ups 2x10 on 4 inch step Sidestepping x10 feet x 3 reps    PATIENT EDUCATION:  Education details: Anatomy of condition, POC, HEP, exercise form/rationale, protocol and post op restrictions, and gait.  PT answered pt's questions.   Person educated: Patient Education method: Explanation, Demonstration, Tactile cues, Verbal cues, and Handouts Education comprehension: verbalized understanding, returned demonstration, verbal cues required, tactile cues required, and needs further education  HOME EXERCISE PROGRAM: [id number]   ASSESSMENT:  CLINICAL IMPRESSION: Pt is ambulating with cane into the clinic  and used cane during Rx.  Pt unable to perform mini squats early in the Rx, but was  able to perform after table exercises and PROM.  Pt is guarded and limited with hip PROM.  Pt had good tolerance overall with exercises per protocol.  She responded well to Rx reporting improved pain from 3/10 before Rx to 2/10 after Rx.  She should benefit from cont skilled PT per protocol to address ongoing goals and impairments and to improve overall function.  REHAB POTENTIAL: Good  CLINICAL DECISION MAKING: Stable/uncomplicated  EVALUATION COMPLEXITY: Low   GOALS: Goals reviewed with patient? Yes  SHORT TERM GOALS: Target date: 4 weeks 12/7  Pain free flexion to 90 Baseline: Goal status: ONGOING    2.  Demo controlled quad set with hold Baseline:  Goal status: GOAL MET  1/9  3.  30s sit to stand without pain or compensation Baseline:  Goal status: INITIAL   4.  SLS 30s without compensation Baseline:  Goal status: INITIAL   5.  Pt will ambulate community distance with good stability without AD.   Goal status:  INITIAL  Target date:  12/04/2023   LONG TERM GOALS:  12/25/2023 Able to demo step up on 6" step with level pelvis and proper form Baseline:  Goal status: PROGRESSING  1/9-- Week 8 1/4  2.  Will tolerate at least 3 min on elliptical, demonstrating good tolerance to repetitive weight bearing motion Baseline:  Goal status: INITIAL  Week 8 11/01/23  3.  Demonstrate proper form in at least 10 continuous lunges without increased pain Baseline:  Goal status: INITIAL  Week 12 11/29/23  4.  Demonstrate gentle, double and single foot plyometric motions with good proximal form Baseline:  Goal status: INITIAL Week 12 11/29/23  5.  Pt will be able to perform stairs with a reciprocal gait with rail.   Goal status:  INITIAL  6.  Pt will be able to perform her ADLs and functional mobility skills without significant pain and difficulty.   Goals status:  INITIAL     PLAN:  PT FREQUENCY: 2x/wk  PT DURATION: 6-7 weeks  PLANNED INTERVENTIONS: 97164- PT Re-evaluation,  97110-Therapeutic exercises, 97530- Therapeutic activity, 97112- Neuromuscular re-education, 97535- Self Care, 40347- Manual therapy, [phone]- Gait training, [phone]- Aquatic Therapy, [phone]- Electrical stimulation (unattended), Patient/Family education, Balance training, Stair training, Taping, Dry Needling, Joint mobilization, Spinal mobilization, Scar mobilization, Cryotherapy, and Moist heat.  PLAN FOR NEXT SESSION: Cont per Dr. Serena Croissant protocol and MD orders.    Audie Clear III PT, DPT 11/19/23 1:21 PM

## 2023-11-20 ENCOUNTER — Encounter (HOSPITAL_BASED_OUTPATIENT_CLINIC_OR_DEPARTMENT_OTHER): Payer: Self-pay

## 2023-11-20 ENCOUNTER — Ambulatory Visit (HOSPITAL_BASED_OUTPATIENT_CLINIC_OR_DEPARTMENT_OTHER): Payer: Medicare Other

## 2023-11-20 DIAGNOSIS — R262 Difficulty in walking, not elsewhere classified: Secondary | ICD-10-CM | POA: Diagnosis not present

## 2023-11-20 DIAGNOSIS — M25651 Stiffness of right hip, not elsewhere classified: Secondary | ICD-10-CM | POA: Diagnosis not present

## 2023-11-20 DIAGNOSIS — M25551 Pain in right hip: Secondary | ICD-10-CM

## 2023-11-20 DIAGNOSIS — M6281 Muscle weakness (generalized): Secondary | ICD-10-CM | POA: Diagnosis not present

## 2023-11-20 NOTE — Therapy (Signed)
OUTPATIENT PHYSICAL THERAPY TREATMENT       Patient Name: [name] MRN: [id number] DOB:11-Jan-1951, 73 y.o., female Today's Date: 11/20/2023  END OF SESSION:  PT End of Session - 11/20/23 0936     Visit Number 14    Number of Visits 24    Date for PT Re-Evaluation 12/25/23    Authorization Type [employer] MCR- add KX at each visit    Progress Note Due on Visit 20    PT Start Time 0933    PT Stop Time 1014    PT Time Calculation (min) 41 min    Equipment Utilized During Treatment Gait belt    Activity Tolerance Patient tolerated treatment well    Behavior During Therapy WFL for tasks assessed/performed                  Past Medical History:  Diagnosis Date   Allergy    Anxiety    Back pain with radiation 07/13/2012   GERD (gastroesophageal reflux disease)    Glaucoma    Hyperlipidemia    Hypertension    Osteoarthritis    PONV (postoperative nausea and vomiting)    Past Surgical History:  Procedure Laterality Date   BREAST CYST EXCISION Right    COLONOSCOPY WITH PROPOFOL N/A 01/02/2022   Procedure: COLONOSCOPY WITH PROPOFOL;  Surgeon: Dolores Frame, MD;  Location: [facility];  Service: Gastroenterology;  Laterality: N/A;  1130   cyst removed from right breast     LAPAROSCOPIC SALPINGO OOPHERECTOMY Right 04/09/2017   Procedure: LAPAROSCOPIC RIGHT SALPINGO OOPHORECTOMY; RIGHT OVARIAN BENIGN CYSTIC TERATOMA;  Surgeon: Lazaro Arms, MD;  Location: [facility];  Service: Gynecology;  Laterality: Right;   PARTIAL HYSTERECTOMY     TUBAL LIGATION     Patient Active Problem List   Diagnosis Date Noted   Tear of right acetabular labrum 09/04/2023   Family history of coronary artery disease 07/18/2023   Pre-op evaluation 07/06/2023   Nonspecific abnormal electrocardiogram (ECG) (EKG) 07/06/2023   Intermittent palpitations 07/06/2023   Syncope and collapse 07/06/2023   Sciatica of right side 02/27/2023   Osteoarthritis of hips, bilateral 09/24/2022    Cervicalgia 09/25/2020   Seasonal allergies 02/18/2020   Depression, major, single episode, in partial remission (HCC) 10/29/2019   Essential hypertension 01/01/2018   Prediabetes 03/30/2015   Preop cardiovascular exam 04/08/2014   Right hip pain 04/06/2014   Hyperlipemia 06/22/2008   GERD (gastroesophageal reflux disease) 05/24/2008   Allergic rhinitis 01/08/2008   OVERACTIVE BLADDER 04/10/2007   GAD (generalized anxiety disorder) 08/22/2006   OSTEOARTHRITIS 08/22/2006    REFERRING PROVIDER:  Huel Cote, MD     REFERRING DIAG:  [phone] (ICD-10-CM) - Tear of right acetabular labrum, initial encounter    s/p Rt hip labral repair  Rationale for Evaluation and Treatment: Rehabilitation  THERAPY DIAG:  Muscle weakness (generalized)  Pain in right hip  Difficulty in walking, not elsewhere classified  Stiffness of right hip, not elsewhere classified  ONSET DATE: DOS 11/7   SUBJECTIVE:  SUBJECTIVE STATEMENT: Pt reports 5/10 pain level at entry. "It wasn't hurting until just before I got here."  PERTINENT HISTORY:  See PMH  PAIN:  Are you having pain? Yes: NPRS scale: 5/10 Pain location: R groin Pain description: stabbing Aggravating factors: constant Relieving factors: meds  PRECAUTIONS:  None  RED FLAGS: None   WEIGHT BEARING RESTRICTIONS:  Yes WBAT  FALLS:  Has patient fallen in last 6 months? No   PLOF:  Independent  PATIENT GOALS:  Get outside, walk, go shopping  OBJECTIVE:  Note: Objective measures were completed at Evaluation unless otherwise noted.  1/23: 30sec STS: 6 reps  TREATMENT:                                                                                                                               11/20/2023 Supine bridge 3x10 S/L clams  2x10 Standing heel raises 3x10 Standing straddle stretch back and forth x10ea Standing hip 3 way 2x10 ea bil Mini squats 2x10 Step ups 6 inch step 2x10 with UE support on rail Lateral step down 4" 2x10  Trialled PROM, but pt too guarded and did not respond to cues to decrease this.    11/18/2023 Upright bike lvl 0 x 6 mins Pt attempted mini squats though stopped due to pain Supine bridge 3x10 S/L clams 2x10 Standing heel raises 2x10 Mini squats 2x10 Step ups 4 inch step 2x10 with UE support on rail Sidestepping at rail with bilat UE support on rail x 2 laps  Pt received R hip PROM in flex, ER, and IR in supine per pt tolerance and tissue tolerance    11/13/2023 PROM R hip Prone hip IR/ER 2x10ea Standing HR 2x20 Sit to stands from elevated plinth 2x10 Standing marching 2x10 (single UE support) Step ups 4" 2x10 Gait training no AD ~231ft  11/11/2023 Bike (trialled but painful) PROM R hip Prone hip IR/ER 2x10ea Minisquats x10 (rail use) Standing HR 2x20 Standing marching x20 (single UE support)   11/06/2023  FOTO:  51 (no change from prior).  Goal:  73  Pt received R hip PROM in flexion and abduction. R hip ROM: PROM:  Flexion: 77, Abd:  13 AROM in prone:  ER:  15, IR:  18   Upright bike x 5 mins lvl 0 Supine bridge 2x10 Prone hip ER/IR  approx 15 reps Step ups 4 inch 2x10 Mini squats 2x10 with UE support      PATIENT EDUCATION:  Education details: Anatomy of condition, POC, HEP, exercise form/rationale, protocol and post op restrictions, and gait.  PT answered pt's questions.   Person educated: Patient Education method: Explanation, Demonstration, Tactile cues, Verbal cues, and Handouts Education comprehension: verbalized understanding, returned demonstration, verbal cues required, tactile cues required, and needs further education  HOME EXERCISE PROGRAM: [id number]   ASSESSMENT:  CLINICAL IMPRESSION: Attempted 30sec STS to assess STG #3 at beginning  of session, however, pt unable to complete due to pain  in anterior hip with hip extension upon standing. At end of session this did improve and pt was able to complete 6 reps in 30 seconds. Pt unable to tolerate PROM due to muscle guarding, so performed standing AROM instead. Pt was able to progress with functional strengthening including step ups at 6" step and lateral step downs from 4" step. Pt continues to ambulate with unsteadiness in absence of AD. Utilized gait belt with standing activities due to instability in R LE.   REHAB POTENTIAL: Good  CLINICAL DECISION MAKING: Stable/uncomplicated  EVALUATION COMPLEXITY: Low   GOALS: Goals reviewed with patient? Yes  SHORT TERM GOALS: Target date: 4 weeks 12/7  Pain free flexion to 90 Baseline: Goal status: ONGOING    2.  Demo controlled quad set with hold Baseline:  Goal status: GOAL MET  1/9  3.  30s sit to stand without pain or compensation Baseline:  Goal status: IN PROGRESS 1/23 (Some pain and compensation present)  4.  SLS 30s without compensation Baseline:  Goal status: INITIAL   5.  Pt will ambulate community distance with good stability without AD.   Goal status:  INITIAL  Target date:  12/04/2023   LONG TERM GOALS:  12/25/2023 Able to demo step up on 6" step with level pelvis and proper form Baseline:  Goal status: PROGRESSING  1/9-- Week 8 1/4  2.  Will tolerate at least 3 min on elliptical, demonstrating good tolerance to repetitive weight bearing motion Baseline:  Goal status: INITIAL  Week 8 11/01/23  3.  Demonstrate proper form in at least 10 continuous lunges without increased pain Baseline:  Goal status: INITIAL  Week 12 11/29/23  4.  Demonstrate gentle, double and single foot plyometric motions with good proximal form Baseline:  Goal status: INITIAL Week 12 11/29/23  5.  Pt will be able to perform stairs with a reciprocal gait with rail.   Goal status:  INITIAL  6.  Pt will be able to perform her ADLs and  functional mobility skills without significant pain and difficulty.   Goals status:  INITIAL     PLAN:  PT FREQUENCY: 2x/wk  PT DURATION: 6-7 weeks  PLANNED INTERVENTIONS: 97164- PT Re-evaluation, 97110-Therapeutic exercises, 97530- Therapeutic activity, 97112- Neuromuscular re-education, 97535- Self Care, 16109- Manual therapy, [phone]- Gait training, [phone]- Aquatic Therapy, [phone]- Electrical stimulation (unattended), Patient/Family education, Balance training, Stair training, Taping, Dry Needling, Joint mobilization, Spinal mobilization, Scar mobilization, Cryotherapy, and Moist heat.  PLAN FOR NEXT SESSION: Cont per Dr. Serena Croissant protocol and MD orders.    Riki Altes, PTA  11/20/23 12:12 PM

## 2023-11-25 ENCOUNTER — Ambulatory Visit (HOSPITAL_BASED_OUTPATIENT_CLINIC_OR_DEPARTMENT_OTHER): Payer: Medicare Other | Admitting: Physical Therapy

## 2023-11-25 ENCOUNTER — Ambulatory Visit (INDEPENDENT_AMBULATORY_CARE_PROVIDER_SITE_OTHER): Payer: Medicare Other | Admitting: Family Medicine

## 2023-11-25 ENCOUNTER — Encounter: Payer: Self-pay | Admitting: Family Medicine

## 2023-11-25 VITALS — BP 120/72 | HR 103 | Ht 64.0 in | Wt 159.0 lb

## 2023-11-25 DIAGNOSIS — E785 Hyperlipidemia, unspecified: Secondary | ICD-10-CM | POA: Diagnosis not present

## 2023-11-25 DIAGNOSIS — E559 Vitamin D deficiency, unspecified: Secondary | ICD-10-CM | POA: Diagnosis not present

## 2023-11-25 DIAGNOSIS — R7303 Prediabetes: Secondary | ICD-10-CM

## 2023-11-25 DIAGNOSIS — L309 Dermatitis, unspecified: Secondary | ICD-10-CM

## 2023-11-25 DIAGNOSIS — B369 Superficial mycosis, unspecified: Secondary | ICD-10-CM

## 2023-11-25 DIAGNOSIS — M25551 Pain in right hip: Secondary | ICD-10-CM

## 2023-11-25 DIAGNOSIS — I10 Essential (primary) hypertension: Secondary | ICD-10-CM

## 2023-11-25 DIAGNOSIS — F5104 Psychophysiologic insomnia: Secondary | ICD-10-CM

## 2023-11-25 DIAGNOSIS — K219 Gastro-esophageal reflux disease without esophagitis: Secondary | ICD-10-CM

## 2023-11-25 DIAGNOSIS — Z1231 Encounter for screening mammogram for malignant neoplasm of breast: Secondary | ICD-10-CM

## 2023-11-25 DIAGNOSIS — J302 Other seasonal allergic rhinitis: Secondary | ICD-10-CM

## 2023-11-25 MED ORDER — CLOTRIMAZOLE-BETAMETHASONE 1-0.05 % EX CREA: TOPICAL_CREAM | CUTANEOUS | 1 refills | Status: DC

## 2023-11-25 MED ORDER — PREDNISONE 5 MG PO TABS: 5.0000 mg | ORAL_TABLET | Freq: Two times a day (BID) | ORAL | 0 refills | Status: AC

## 2023-11-25 MED ORDER — HYDROXYZINE PAMOATE 25 MG PO CAPS: ORAL_CAPSULE | ORAL | 3 refills | Status: DC

## 2023-11-25 NOTE — Patient Instructions (Signed)
Annual exam in office in May or June with MD, call if you need me sooner.  Please schedule mammogram at checkout.  Fasting CBC lipid CMP and EGFR TSH vitamin D and HbA1c to be drawn 1 week before next scheduled appointment.  Medication is sent in for itching and sleep.  These are hydroxyzine 1 to 2 capsules at bedtime.  Prednisone for 5 days only.  And and cream clotrimazole/betamethasone cream to be applied sparingly to the back for 1 week then as needed.  Very important that you keep your skin well moisturized you may add baby oil to any lotion that you use and apply at least twice daily to your entire skin.  Thanks for choosing [facility], we consider it a privelige to serve you.

## 2023-11-26 DIAGNOSIS — L309 Dermatitis, unspecified: Secondary | ICD-10-CM | POA: Insufficient documentation

## 2023-11-26 DIAGNOSIS — E559 Vitamin D deficiency, unspecified: Secondary | ICD-10-CM | POA: Insufficient documentation

## 2023-11-26 DIAGNOSIS — G4700 Insomnia, unspecified: Secondary | ICD-10-CM | POA: Insufficient documentation

## 2023-11-26 DIAGNOSIS — B369 Superficial mycosis, unspecified: Secondary | ICD-10-CM | POA: Insufficient documentation

## 2023-11-26 NOTE — Progress Notes (Signed)
[name]     MRN: [id number]      DOB: 1951-06-04  Chief Complaint  Patient presents with   Follow-up    Follow up recent surgery    HPI [name] is here for follow up and re-evaluation of chronic medical conditions, medication management and review of any available recent lab and radiology data.  Preventive health is updated, specifically  Cancer screening and Immunization.   Had right hip surgery on 09/04/2023, inntherapy twice weekly making progress gradually Poor sleep and increased anxiety due to her son's health, he is totally dependent on her at age 49. C/o dry itchy skin worse in lower back and scaly rash The PT denies any adverse reactions to current medications since the last visit.     ROS Denies recent fever or chills. Increased  sinus pressure, nasal congestion, denies ear pain or sore throat. Denies chest congestion, productive cough or wheezing. Denies chest pains, palpitations and leg swelling Denies abdominal pain, nausea, vomiting,diarrhea or constipation.   Denies dysuria, frequency, hesitancy or incontinence. . Denies headaches, seizures, numbness, or tingling. Denies depression, anxiety or insomnia. C/o dry skin with rash on lower back and itching.   PE  BP 120/72 (BP Location: Right Arm, Patient Position: Sitting, Cuff Size: Normal)   Pulse (!) 103   Ht 5\' 4"  (1.626 m)   Wt 159 lb 0.6 oz (72.1 kg)   SpO2 95%   BMI 27.30 kg/m   Patient alert and oriented and in no cardiopulmonary distress.  HEENT: No facial asymmetry, EOMI,     Neck supple .  Chest: Clear to auscultation bilaterally.  CVS: S1, S2 no murmurs, no S3.Regular rate.  ABD: Soft non tender.   Ext: No edema  MS: decreased  ROM  lumbar spine, sand right  hip, normal on shoulders  and knees.  Skin: Intact, dry, fungal rash on lower back, eczema present in patches rash noted.Surgical scar well healed  Psych: Good eye contact, normal affect. Memory intact not anxious or depressed  appearing.  CNS: CN 2-12 intact, power,  normal throughout.no focal deficits noted.   Assessment & Plan  Essential hypertension Controlled, no change in medication DASH diet and commitment to daily physical activity for a minimum of 30 minutes discussed and encouraged, as a part of hypertension management. The importance of attaining a healthy weight is also discussed.     11/25/2023    8:05 AM 09/04/2023    6:30 PM 09/04/2023    6:15 PM 09/04/2023    6:00 PM 09/04/2023    5:45 PM 09/04/2023    5:37 PM 09/04/2023    1:25 PM  BP/Weight  Systolic BP 120 144 140 146 135 165 131  Diastolic BP 72 78 83 87 101 93 61  Wt. (Lbs) 159.04      161  BMI 27.3 kg/m2      29.45 kg/m2       GERD (gastroesophageal reflux disease) Controlled, no change in medication   Vitamin D deficiency Updated lab needed at/ before next visit.   Right hip pain Improved following surgery 08/2023  Prediabetes Patient educated about the importance of limiting  Carbohydrate intake , the need to commit to daily physical activity for a minimum of 30 minutes , and to commit weight loss. The fact that changes in all these areas will reduce or eliminate all together the development of diabetes is stressed.      Latest Ref Rng & Units 06/26/2023  2:16 PM 06/25/2023    2:04 PM 04/15/2023    9:52 PM 02/27/2023   11:41 AM 07/26/2022    2:41 PM  Diabetic Labs  HbA1c 4.8 - 5.6 %    5.9  5.7   Chol 100 - 199 mg/dL    846  962   HDL >95 mg/dL    69  79   Calc LDL 0 - 99 mg/dL    71  284   Triglycerides 0 - 149 mg/dL    132  440   Creatinine 0.44 - 1.00 mg/dL 1.02  7.25  3.66  4.40  0.83       11/25/2023    8:05 AM 09/04/2023    6:30 PM 09/04/2023    6:15 PM 09/04/2023    6:00 PM 09/04/2023    5:45 PM 09/04/2023    5:37 PM 09/04/2023    1:25 PM  BP/Weight  Systolic BP 120 144 140 146 135 165 131  Diastolic BP 72 78 83 87 101 93 61  Wt. (Lbs) 159.04      161  BMI 27.3 kg/m2      29.45 kg/m2       No data to  display          Updated lab needed at/ before next visit.   Insomnia Sleep hygiene reviewed and written information offered also. Prescription sent for  medication needed. Start bed time hydroxyzine  Allergic rhinitis Currently experiencing increased symptoms, 5 day course ofd prednisone  Dermatitis Generalized esp in creases , elbows and knees, short course oral predniosne  Dermatomycosis Rash on mid and lower back, clotrimazole/ betameth twice daily, x 1 week , then as needed

## 2023-11-26 NOTE — Assessment & Plan Note (Signed)
Currently experiencing increased symptoms, 5 day course ofd prednisone

## 2023-11-26 NOTE — Assessment & Plan Note (Signed)
Controlled, no change in medication

## 2023-11-26 NOTE — Assessment & Plan Note (Signed)
Improved following surgery 08/2023

## 2023-11-26 NOTE — Assessment & Plan Note (Signed)
Patient educated about the importance of limiting  Carbohydrate intake , the need to commit to daily physical activity for a minimum of 30 minutes , and to commit weight loss. The fact that changes in all these areas will reduce or eliminate all together the development of diabetes is stressed.      Latest Ref Rng & Units 06/26/2023    2:16 PM 06/25/2023    2:04 PM 04/15/2023    9:52 PM 02/27/2023   11:41 AM 07/26/2022    2:41 PM  Diabetic Labs  HbA1c 4.8 - 5.6 %    5.9  5.7   Chol 100 - 199 mg/dL    161  096   HDL >04 mg/dL    69  79   Calc LDL 0 - 99 mg/dL    71  540   Triglycerides 0 - 149 mg/dL    981  191   Creatinine 0.44 - 1.00 mg/dL 4.78  2.95  6.21  3.08  0.83       11/25/2023    8:05 AM 09/04/2023    6:30 PM 09/04/2023    6:15 PM 09/04/2023    6:00 PM 09/04/2023    5:45 PM 09/04/2023    5:37 PM 09/04/2023    1:25 PM  BP/Weight  Systolic BP 120 144 140 146 135 165 131  Diastolic BP 72 78 83 87 101 93 61  Wt. (Lbs) 159.04      161  BMI 27.3 kg/m2      29.45 kg/m2       No data to display          Updated lab needed at/ before next visit.

## 2023-11-26 NOTE — Assessment & Plan Note (Signed)
Updated lab needed at/ before next visit.

## 2023-11-26 NOTE — Assessment & Plan Note (Signed)
Controlled, no change in medication DASH diet and commitment to daily physical activity for a minimum of 30 minutes discussed and encouraged, as a part of hypertension management. The importance of attaining a healthy weight is also discussed.     11/25/2023    8:05 AM 09/04/2023    6:30 PM 09/04/2023    6:15 PM 09/04/2023    6:00 PM 09/04/2023    5:45 PM 09/04/2023    5:37 PM 09/04/2023    1:25 PM  BP/Weight  Systolic BP 120 144 140 146 135 165 131  Diastolic BP 72 78 83 87 101 93 61  Wt. (Lbs) 159.04      161  BMI 27.3 kg/m2      29.45 kg/m2

## 2023-11-26 NOTE — Assessment & Plan Note (Signed)
Generalized esp in creases , elbows and knees, short course oral predniosne

## 2023-11-26 NOTE — Assessment & Plan Note (Signed)
Rash on mid and lower back, clotrimazole/ betameth twice daily, x 1 week , then as needed

## 2023-11-26 NOTE — Assessment & Plan Note (Signed)
Sleep hygiene reviewed and written information offered also. Prescription sent for  medication needed. Start bedtime hydroxyzine

## 2023-11-27 ENCOUNTER — Ambulatory Visit (HOSPITAL_BASED_OUTPATIENT_CLINIC_OR_DEPARTMENT_OTHER): Payer: Medicare Other | Admitting: Physical Therapy

## 2023-11-27 ENCOUNTER — Encounter (HOSPITAL_BASED_OUTPATIENT_CLINIC_OR_DEPARTMENT_OTHER): Payer: Self-pay | Admitting: Physical Therapy

## 2023-11-27 DIAGNOSIS — R262 Difficulty in walking, not elsewhere classified: Secondary | ICD-10-CM | POA: Diagnosis not present

## 2023-11-27 DIAGNOSIS — M25551 Pain in right hip: Secondary | ICD-10-CM | POA: Diagnosis not present

## 2023-11-27 DIAGNOSIS — M25651 Stiffness of right hip, not elsewhere classified: Secondary | ICD-10-CM

## 2023-11-27 DIAGNOSIS — M6281 Muscle weakness (generalized): Secondary | ICD-10-CM | POA: Diagnosis not present

## 2023-11-27 NOTE — Therapy (Signed)
OUTPATIENT PHYSICAL THERAPY TREATMENT       Patient Name: [name] MRN: [id number] DOB:06/07/1951, 73 y.o., female Today's Date: 11/27/2023  END OF SESSION:  PT End of Session - 11/27/23 0940     Visit Number 15    Number of Visits 24    Date for PT Re-Evaluation 12/25/23    PT Start Time 0936    PT Stop Time 1016    PT Time Calculation (min) 40 min    Activity Tolerance Patient tolerated treatment well    Behavior During Therapy [facility] for tasks assessed/performed                  Past Medical History:  Diagnosis Date   Allergy    Anxiety    Back pain with radiation 07/13/2012   GERD (gastroesophageal reflux disease)    Glaucoma    Hyperlipidemia    Hypertension    Osteoarthritis    PONV (postoperative nausea and vomiting)    Past Surgical History:  Procedure Laterality Date   BREAST CYST EXCISION Right    COLONOSCOPY WITH PROPOFOL N/A 01/02/2022   Procedure: COLONOSCOPY WITH PROPOFOL;  Surgeon: Dolores Frame, MD;  Location: [facility];  Service: Gastroenterology;  Laterality: N/A;  1130   cyst removed from right breast     LAPAROSCOPIC SALPINGO OOPHERECTOMY Right 04/09/2017   Procedure: LAPAROSCOPIC RIGHT SALPINGO OOPHORECTOMY; RIGHT OVARIAN BENIGN CYSTIC TERATOMA;  Surgeon: Lazaro Arms, MD;  Location: [facility] ORS;  Service: Gynecology;  Laterality: Right;   PARTIAL HYSTERECTOMY     TUBAL LIGATION     Patient Active Problem List   Diagnosis Date Noted   Vitamin D deficiency 11/26/2023   Insomnia 11/26/2023   Dermatitis 11/26/2023   Dermatomycosis 11/26/2023   Tear of right acetabular labrum 09/04/2023   Family history of coronary artery disease 07/18/2023   Pre-op evaluation 07/06/2023   Nonspecific abnormal electrocardiogram (ECG) (EKG) 07/06/2023   Intermittent palpitations 07/06/2023   Syncope and collapse 07/06/2023   Sciatica of right side 02/27/2023   Osteoarthritis of hips, bilateral 09/24/2022   Cervicalgia 09/25/2020    Seasonal allergies 02/18/2020   Depression, major, single episode, in partial remission (HCC) 10/29/2019   Essential hypertension 01/01/2018   Prediabetes 03/30/2015   Preop cardiovascular exam 04/08/2014   Right hip pain 04/06/2014   Hyperlipemia 06/22/2008   GERD (gastroesophageal reflux disease) 05/24/2008   Allergic rhinitis 01/08/2008   OVERACTIVE BLADDER 04/10/2007   GAD (generalized anxiety disorder) 08/22/2006   OSTEOARTHRITIS 08/22/2006    REFERRING PROVIDER:  Huel Cote, MD     REFERRING DIAG:  [phone] (ICD-10-CM) - Tear of right acetabular labrum, initial encounter    s/p Rt hip labral repair  Rationale for Evaluation and Treatment: Rehabilitation  THERAPY DIAG:  Muscle weakness (generalized)  Pain in right hip  Difficulty in walking, not elsewhere classified  Stiffness of right hip, not elsewhere classified  ONSET DATE: DOS 11/7   SUBJECTIVE:  SUBJECTIVE STATEMENT: Pt is 12 weeks post op.  Pt states she felt good walking from parking lot to the church building with [facility].  Pt denies any adverse effects after prior Rx.     PERTINENT HISTORY:  See PMH  PAIN:  Are you having pain? Yes: NPRS scale: 3/10 Pain location: R groin Pain description: stabbing Aggravating factors: constant Relieving factors: meds  PRECAUTIONS:  None  RED FLAGS: None   WEIGHT BEARING RESTRICTIONS:  Yes WBAT  FALLS:  Has patient fallen in last 6 months? No   PLOF:  Independent  PATIENT GOALS:  Get outside, walk, go shopping  OBJECTIVE:  Note: Objective measures were completed at Evaluation unless otherwise noted.  1/23: 30sec STS: 6 reps  TREATMENT:                                                                                                                                11/27/2023 Supine bridge 3x10 S/L clams 2x10 S/L hip abd with PT assist 2x5 Step ups 6 inch step 2x10 with UE support  Step downs 4 inch step with UE support x10 Sidestepping at rail with bilat UE support on rail x 2 laps Standing hip abd x 10 bilat with UE support  Pt received R hip PROM in flex, ER, and IR in supine per pt tolerance and tissue tolerance   11/20/2023 Supine bridge 3x10 S/L clams 2x10 Standing heel raises 3x10 Standing straddle stretch back and forth x10ea Standing hip 3 way 2x10 ea bil Mini squats 2x10 Step ups 6 inch step 2x10 with UE support on rail Lateral step down 4" 2x10  Trialled PROM, but pt too guarded and did not respond to cues to decrease this.    11/18/2023 Upright bike lvl 0 x 6 mins Pt attempted mini squats though stopped due to pain Supine bridge 3x10 S/L clams 2x10 Standing heel raises 2x10 Mini squats 2x10 Step ups 4 inch step 2x10 with UE support on rail Sidestepping at rail with bilat UE support on rail x 2 laps  Pt received R hip PROM in flex, ER, and IR in supine per pt tolerance and tissue tolerance    11/13/2023 PROM R hip Prone hip IR/ER 2x10ea Standing HR 2x20 Sit to stands from elevated plinth 2x10 Standing marching 2x10 (single UE support) Step ups 4" 2x10 Gait training no AD ~249ft  11/11/2023 Bike (trialled but painful) PROM R hip Prone hip IR/ER 2x10ea Minisquats x10 (rail use) Standing HR 2x20 Standing marching x20 (single UE support)    PATIENT EDUCATION:  Education details: Anatomy of condition, POC, HEP, exercise form/rationale, protocol and post op restrictions, and gait.  PT answered pt's questions.   Person educated: Patient Education method: Explanation, Demonstration, Tactile cues, Verbal cues, and Handouts Education comprehension: verbalized understanding, returned demonstration, verbal cues required, tactile cues required, and needs further education  HOME EXERCISE  PROGRAM: [email or web address]   ASSESSMENT:  CLINICAL IMPRESSION: Pt is limited  and guarded with R hip PROM.  Pt had improved hip flexion PROM though still very limited in ER PROM based upon visual inspection.  She is improving with tolerance for exercises and is slowly progressing with exercises per protocol.  Pt responded well to Rx reporting improved pain from 3/10 before Rx to 1/10 after Rx.  Pt should benefit from cont skilled PT per protocol to improve ROM, gait, pain, strength, and functional mobility.   REHAB POTENTIAL: Good  CLINICAL DECISION MAKING: Stable/uncomplicated  EVALUATION COMPLEXITY: Low   GOALS: Goals reviewed with patient? Yes  SHORT TERM GOALS: Target date: 4 weeks 12/7  Pain free flexion to 90 Baseline: Goal status: ONGOING    2.  Demo controlled quad set with hold Baseline:  Goal status: GOAL MET  1/9  3.  30s sit to stand without pain or compensation Baseline:  Goal status: IN PROGRESS 1/23 (Some pain and compensation present)  4.  SLS 30s without compensation Baseline:  Goal status: INITIAL   5.  Pt will ambulate community distance with good stability without AD.   Goal status:  INITIAL  Target date:  12/04/2023   LONG TERM GOALS:  12/25/2023 Able to demo step up on 6" step with level pelvis and proper form Baseline:  Goal status: PROGRESSING  1/9-- Week 8 1/4  2.  Will tolerate at least 3 min on elliptical, demonstrating good tolerance to repetitive weight bearing motion Baseline:  Goal status: INITIAL  Week 8 11/01/23  3.  Demonstrate proper form in at least 10 continuous lunges without increased pain Baseline:  Goal status: INITIAL  Week 12 11/29/23  4.  Demonstrate gentle, double and single foot plyometric motions with good proximal form Baseline:  Goal status: INITIAL Week 12 11/29/23  5.  Pt will be able to perform stairs with a reciprocal gait with rail.   Goal status:  INITIAL  6.  Pt will be able to perform her ADLs and functional mobility  skills without significant pain and difficulty.   Goals status:  INITIAL     PLAN:  PT FREQUENCY: 2x/wk  PT DURATION: 6-7 weeks  PLANNED INTERVENTIONS: 97164- PT Re-evaluation, 97110-Therapeutic exercises, 97530- Therapeutic activity, 97112- Neuromuscular re-education, 97535- Self Care, 96045- Manual therapy, [phone]- Gait training, [phone]- Aquatic Therapy, [phone]- Electrical stimulation (unattended), Patient/Family education, Balance training, Stair training, Taping, Dry Needling, Joint mobilization, Spinal mobilization, Scar mobilization, Cryotherapy, and Moist heat.  PLAN FOR NEXT SESSION: Cont per Dr. Serena Croissant protocol and MD orders.    Audie Clear III PT, DPT 11/27/23 9:38 PM

## 2023-12-04 ENCOUNTER — Ambulatory Visit (HOSPITAL_BASED_OUTPATIENT_CLINIC_OR_DEPARTMENT_OTHER): Payer: Medicare Other | Attending: Orthopaedic Surgery

## 2023-12-04 ENCOUNTER — Encounter (HOSPITAL_BASED_OUTPATIENT_CLINIC_OR_DEPARTMENT_OTHER): Payer: Self-pay

## 2023-12-04 DIAGNOSIS — M25551 Pain in right hip: Secondary | ICD-10-CM | POA: Diagnosis not present

## 2023-12-04 DIAGNOSIS — R262 Difficulty in walking, not elsewhere classified: Secondary | ICD-10-CM | POA: Insufficient documentation

## 2023-12-04 DIAGNOSIS — M6281 Muscle weakness (generalized): Secondary | ICD-10-CM | POA: Diagnosis not present

## 2023-12-04 DIAGNOSIS — M25651 Stiffness of right hip, not elsewhere classified: Secondary | ICD-10-CM | POA: Diagnosis not present

## 2023-12-04 NOTE — Therapy (Signed)
 OUTPATIENT PHYSICAL THERAPY TREATMENT       Patient Name: [name] MRN: [id number] DOB:[birth year], 73 y.o., female Today's Date: 12/04/2023  END OF SESSION:  PT End of Session - 12/04/23 0917     Visit Number 16    Number of Visits 24    Date for PT Re-Evaluation 12/25/23    Authorization Type [employer] MCR- add KX at each visit    Progress Note Due on Visit 20    PT Start Time 0802    PT Stop Time 0846    PT Time Calculation (min) 44 min    Activity Tolerance Patient tolerated treatment well    Behavior During Therapy [facility] for tasks assessed/performed                   Past Medical History:  Diagnosis Date   Allergy    Anxiety    Back pain with radiation 07/13/2012   GERD (gastroesophageal reflux disease)    Glaucoma    Hyperlipidemia    Hypertension    Osteoarthritis    PONV (postoperative nausea and vomiting)    Past Surgical History:  Procedure Laterality Date   BREAST CYST EXCISION Right    COLONOSCOPY WITH PROPOFOL  N/A 01/02/2022   Procedure: COLONOSCOPY WITH PROPOFOL ;  Surgeon: Eartha Angelia Sieving, MD;  Location: [facility];  Service: Gastroenterology;  Laterality: N/A;  1130   cyst removed from right breast     LAPAROSCOPIC SALPINGO OOPHERECTOMY Right 04/09/2017   Procedure: LAPAROSCOPIC RIGHT SALPINGO OOPHORECTOMY; RIGHT OVARIAN BENIGN CYSTIC TERATOMA;  Surgeon: Jayne Vonn DEL, MD;  Location: [facility];  Service: Gynecology;  Laterality: Right;   PARTIAL HYSTERECTOMY     TUBAL LIGATION     Patient Active Problem List   Diagnosis Date Noted   Vitamin D  deficiency 11/26/2023   Insomnia 11/26/2023   Dermatitis 11/26/2023   Dermatomycosis 11/26/2023   Tear of right acetabular labrum 09/04/2023   Family history of coronary artery disease 07/18/2023   Pre-op evaluation 07/06/2023   Nonspecific abnormal electrocardiogram (ECG) (EKG) 07/06/2023   Intermittent palpitations 07/06/2023   Syncope and collapse 07/06/2023   Sciatica of right side  02/27/2023   Osteoarthritis of hips, bilateral 09/24/2022   Cervicalgia 09/25/2020   Seasonal allergies 02/18/2020   Depression, major, single episode, in partial remission (HCC) 10/29/2019   Essential hypertension 01/01/2018   Prediabetes 03/30/2015   Preop cardiovascular exam 04/08/2014   Right hip pain 04/06/2014   Hyperlipemia 06/22/2008   GERD (gastroesophageal reflux disease) 05/24/2008   Allergic rhinitis 01/08/2008   OVERACTIVE BLADDER 04/10/2007   GAD (generalized anxiety disorder) 08/22/2006   OSTEOARTHRITIS 08/22/2006    REFERRING PROVIDER:  Genelle Standing, MD     REFERRING DIAG:  [phone] (ICD-10-CM) - Tear of right acetabular labrum, initial encounter    s/p Rt hip labral repair  Rationale for Evaluation and Treatment: Rehabilitation  THERAPY DIAG:  Difficulty in walking, not elsewhere classified  Pain in right hip  Muscle weakness (generalized)  ONSET DATE: DOS 11/7   SUBJECTIVE:  SUBJECTIVE STATEMENT: Pt reports she was able to go up the stairs pretty good. She has been going sone step at a time down the stairs. Mild discomfort in hip at entry.     PERTINENT HISTORY:  See PMH  PAIN:  Are you having pain? Yes: NPRS scale: 1/10 Pain location: R groin Pain description: stabbing Aggravating factors: constant Relieving factors: meds  PRECAUTIONS:  None  RED FLAGS: None   WEIGHT BEARING RESTRICTIONS:  Yes WBAT  FALLS:  Has patient fallen in last 6 months? No   PLOF:  Independent  PATIENT GOALS:  Get outside, walk, go shopping  OBJECTIVE:  Note: Objective measures were completed at Evaluation unless otherwise noted.  1/23: 30sec STS: 6 reps  TREATMENT:                                                                                                                                12/04/2023 Supine bridge 3x10 S/L clams 3x10 S/L hip abd with PTA assist x10 Step ups 6 inch step 2x10 with UE support  Step downs 4 inch step with UE support x10 Sidestepping at rail with bilat UE support on rail x 2 laps Standing hip abd 2x 10 bilat with UE support Gait in hall without AD x1 lap Sit to stands 2x10 SLS trials   Pt received R hip PROM in flex, ER, and IR in supine per pt tolerance and tissue tolerance   11/27/2023 Supine bridge 3x10 S/L clams 2x10 S/L hip abd with PT assist 2x5 Step ups 6 inch step 2x10 with UE support  Step downs 4 inch step with UE support x10 Sidestepping at rail with bilat UE support on rail x 2 laps Standing hip abd x 10 bilat with UE support  Pt received R hip PROM in flex, ER, and IR in supine per pt tolerance and tissue tolerance   11/20/2023 Supine bridge 3x10 S/L clams 2x10 Standing heel raises 3x10 Standing straddle stretch back and forth x10ea Standing hip 3 way 2x10 ea bil Mini squats 2x10 Step ups 6 inch step 2x10 with UE support on rail Lateral step down 4 2x10  Trialled PROM, but pt too guarded and did not respond to cues to decrease this.    11/18/2023 Upright bike lvl 0 x 6 mins Pt attempted mini squats though stopped due to pain Supine bridge 3x10 S/L clams 2x10 Standing heel raises 2x10 Mini squats 2x10 Step ups 4 inch step 2x10 with UE support on rail Sidestepping at rail with bilat UE support on rail x 2 laps  Pt received R hip PROM in flex, ER, and IR in supine per pt tolerance and tissue tolerance    11/13/2023 PROM R hip Prone hip IR/ER 2x10ea Standing HR 2x20 Sit to stands from elevated plinth 2x10 Standing marching 2x10 (single UE support) Step ups 4 2x10 Gait training no AD ~272ft  11/11/2023 Bike (trialled but painful) PROM R hip Prone hip  IR/ER 2x10ea Minisquats x10 (rail use) Standing HR 2x20 Standing marching x20 (single UE support)    PATIENT EDUCATION:   Education details: Anatomy of condition, POC, HEP, exercise form/rationale, protocol and post op restrictions, and gait.  PT answered pt's questions.   Person educated: Patient Education method: Explanation, Demonstration, Tactile cues, Verbal cues, and Handouts Education comprehension: verbalized understanding, returned demonstration, verbal cues required, tactile cues required, and needs further education  HOME EXERCISE PROGRAM: [id number]   ASSESSMENT:  CLINICAL IMPRESSION: Pt is limited and guarded with R hip PROM, though improving with available passive hip flexion and abduction. IR and ER are most guarded. Good tolerance for progressions to strengthening. She demonstrates improving steadiness while ambulating without AD. Good performance with sit to stands, though she does demonstrate mild weight shift to R LE. Able to complete 2 step ups at 6 step today without UE support, demonstrating good steadiness.   REHAB POTENTIAL: Good  CLINICAL DECISION MAKING: Stable/uncomplicated  EVALUATION COMPLEXITY: Low   GOALS: Goals reviewed with patient? Yes  SHORT TERM GOALS: Target date: 4 weeks 12/7  Pain free flexion to 90 Baseline: Goal status: ONGOING    2.  Demo controlled quad set with hold Baseline:  Goal status: GOAL MET  1/9  3.  30s sit to stand without pain or compensation Baseline:  Goal status: IN PROGRESS 1/23 (Some pain and compensation present)  4.  SLS 30s without compensation Baseline:  Goal status: INITIAL   5.  Pt will ambulate community distance with good stability without AD.   Goal status:  INITIAL  Target date:  12/04/2023   LONG TERM GOALS:  12/25/2023 Able to demo step up on 6 step with level pelvis and proper form Baseline:  Goal status: PROGRESSING  1/9-- Week 8 1/4  2.  Will tolerate at least 3 min on elliptical, demonstrating good tolerance to repetitive weight bearing motion Baseline:  Goal status: INITIAL  Week 8 11/01/23  3.  Demonstrate  proper form in at least 10 continuous lunges without increased pain Baseline:  Goal status: INITIAL  Week 12 11/29/23  4.  Demonstrate gentle, double and single foot plyometric motions with good proximal form Baseline:  Goal status: INITIAL Week 12 11/29/23  5.  Pt will be able to perform stairs with a reciprocal gait with rail.   Goal status:  INITIAL  6.  Pt will be able to perform her ADLs and functional mobility skills without significant pain and difficulty.   Goals status:  INITIAL     PLAN:  PT FREQUENCY: 2x/wk  PT DURATION: 6-7 weeks  PLANNED INTERVENTIONS: 97164- PT Re-evaluation, 97110-Therapeutic exercises, 97530- Therapeutic activity, 97112- Neuromuscular re-education, 97535- Self Care, 02859- Manual therapy, [phone]- Gait training, [phone]- Aquatic Therapy, [phone]- Electrical stimulation (unattended), Patient/Family education, Balance training, Stair training, Taping, Dry Needling, Joint mobilization, Spinal mobilization, Scar mobilization, Cryotherapy, and Moist heat.  PLAN FOR NEXT SESSION: Cont per Dr. Danetta protocol and MD orders.    Asberry Rodes, PTA  12/04/23 10:07 AM

## 2023-12-05 ENCOUNTER — Other Ambulatory Visit: Payer: Self-pay | Admitting: Family Medicine

## 2023-12-05 DIAGNOSIS — F4323 Adjustment disorder with mixed anxiety and depressed mood: Secondary | ICD-10-CM

## 2023-12-08 DIAGNOSIS — K080 Exfoliation of teeth due to systemic causes: Secondary | ICD-10-CM | POA: Diagnosis not present

## 2023-12-10 ENCOUNTER — Ambulatory Visit (HOSPITAL_BASED_OUTPATIENT_CLINIC_OR_DEPARTMENT_OTHER): Payer: Medicare Other

## 2023-12-17 ENCOUNTER — Encounter (HOSPITAL_BASED_OUTPATIENT_CLINIC_OR_DEPARTMENT_OTHER): Payer: Self-pay | Admitting: Physical Therapy

## 2023-12-17 ENCOUNTER — Ambulatory Visit (HOSPITAL_BASED_OUTPATIENT_CLINIC_OR_DEPARTMENT_OTHER): Payer: Medicare Other | Admitting: Physical Therapy

## 2023-12-17 DIAGNOSIS — M6281 Muscle weakness (generalized): Secondary | ICD-10-CM | POA: Diagnosis not present

## 2023-12-17 DIAGNOSIS — R262 Difficulty in walking, not elsewhere classified: Secondary | ICD-10-CM

## 2023-12-17 DIAGNOSIS — M25551 Pain in right hip: Secondary | ICD-10-CM

## 2023-12-17 DIAGNOSIS — M25651 Stiffness of right hip, not elsewhere classified: Secondary | ICD-10-CM | POA: Diagnosis not present
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
## 2023-12-22 ENCOUNTER — Other Ambulatory Visit: Payer: Self-pay | Admitting: Family Medicine

## 2023-12-25 ENCOUNTER — Ambulatory Visit (HOSPITAL_BASED_OUTPATIENT_CLINIC_OR_DEPARTMENT_OTHER): Payer: Medicare Other

## 2023-12-25 ENCOUNTER — Other Ambulatory Visit: Payer: Self-pay | Admitting: Family Medicine

## 2023-12-30 ENCOUNTER — Encounter (HOSPITAL_BASED_OUTPATIENT_CLINIC_OR_DEPARTMENT_OTHER): Payer: Self-pay | Admitting: Physical Therapy

## 2023-12-30 ENCOUNTER — Ambulatory Visit (HOSPITAL_BASED_OUTPATIENT_CLINIC_OR_DEPARTMENT_OTHER): Payer: Medicare Other | Attending: Orthopaedic Surgery | Admitting: Physical Therapy

## 2023-12-30 DIAGNOSIS — R262 Difficulty in walking, not elsewhere classified: Secondary | ICD-10-CM | POA: Insufficient documentation

## 2023-12-30 DIAGNOSIS — M25651 Stiffness of right hip, not elsewhere classified: Secondary | ICD-10-CM | POA: Insufficient documentation

## 2023-12-30 DIAGNOSIS — M6281 Muscle weakness (generalized): Secondary | ICD-10-CM | POA: Insufficient documentation

## 2023-12-30 DIAGNOSIS — M25551 Pain in right hip: Secondary | ICD-10-CM | POA: Diagnosis not present

## 2023-12-30 NOTE — Therapy (Incomplete)
 OUTPATIENT PHYSICAL THERAPY TREATMENT       Patient Name: [name] MRN: [id number] DOB:24-Mar-1951, 73 y.o., female Today's Date: 12/31/2023  END OF SESSION:  PT End of Session - 12/30/23 0900     Visit Number 18    Number of Visits 28    Date for PT Re-Evaluation 02/10/24    Authorization Type BC[employer] MCR    PT Start Time 0853    PT Stop Time 0937    PT Time Calculation (min) 44 min    Activity Tolerance Patient tolerated treatment well    Behavior During Therapy [facility] for tasks assessed/performed                    Past Medical History:  Diagnosis Date   Allergy    Anxiety    Back pain with radiation 07/13/2012   GERD (gastroesophageal reflux disease)    Glaucoma    Hyperlipidemia    Hypertension    Osteoarthritis    PONV (postoperative nausea and vomiting)    Past Surgical History:  Procedure Laterality Date   BREAST CYST EXCISION Right    COLONOSCOPY WITH PROPOFOL N/A 01/02/2022   Procedure: COLONOSCOPY WITH PROPOFOL;  Surgeon: Dolores Frame, MD;  Location: [facility];  Service: Gastroenterology;  Laterality: N/A;  1130   cyst removed from right breast     LAPAROSCOPIC SALPINGO OOPHERECTOMY Right 04/09/2017   Procedure: LAPAROSCOPIC RIGHT SALPINGO OOPHORECTOMY; RIGHT OVARIAN BENIGN CYSTIC TERATOMA;  Surgeon: Lazaro Arms, MD;  Location: [facility];  Service: Gynecology;  Laterality: Right;   PARTIAL HYSTERECTOMY     TUBAL LIGATION     Patient Active Problem List   Diagnosis Date Noted   Vitamin D deficiency 11/26/2023   Insomnia 11/26/2023   Dermatitis 11/26/2023   Dermatomycosis 11/26/2023   Tear of right acetabular labrum 09/04/2023   Family history of coronary artery disease 07/18/2023   Pre-op evaluation 07/06/2023   Nonspecific abnormal electrocardiogram (ECG) (EKG) 07/06/2023   Intermittent palpitations 07/06/2023   Syncope and collapse 07/06/2023   Sciatica of right side 02/27/2023   Osteoarthritis of hips, bilateral  09/24/2022   Cervicalgia 09/25/2020   Seasonal allergies 02/18/2020   Depression, major, single episode, in partial remission (HCC) 10/29/2019   Essential hypertension 01/01/2018   Prediabetes 03/30/2015   Preop cardiovascular exam 04/08/2014   Right hip pain 04/06/2014   Hyperlipemia 06/22/2008   GERD (gastroesophageal reflux disease) 05/24/2008   Allergic rhinitis 01/08/2008   OVERACTIVE BLADDER 04/10/2007   GAD (generalized anxiety disorder) 08/22/2006   OSTEOARTHRITIS 08/22/2006    REFERRING PROVIDER:  Huel Cote, MD     REFERRING DIAG:  [phone] (ICD-10-CM) - Tear of right acetabular labrum, initial encounter    s/p Rt hip labral repair  Rationale for Evaluation and Treatment: Rehabilitation  THERAPY DIAG:  Muscle weakness (generalized)  Pain in right hip  Difficulty in walking, not elsewhere classified  Stiffness of right hip, not elsewhere classified  ONSET DATE: DOS 11/7   SUBJECTIVE:  SUBJECTIVE STATEMENT: Pt is 16 weeks and 5 days post op.   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 RESPONSE TO PRIOR RX:  Pt states she felt good after prior Rx.  FUNCTIONAL IMPROVEMENTS:  walking better without cane, decreased usage of cane, stairs, household chores, pain, walking in her yard, transfers FUNCTIONAL LIMITATIONS:  prolonged standing, car transfers, ambulation distance.     PERTINENT HISTORY:  See PMH  PAIN:  Are you having pain? Yes: NPRS scale: 4/10 Pain location: R groin Pain description: stabbing Aggravating factors: constant Relieving factors: meds  PRECAUTIONS:  None  RED FLAGS: None   WEIGHT BEARING RESTRICTIONS:  Yes WBAT  FALLS:  Has patient fallen in last 6 months? No   PLOF:   Independent  PATIENT GOALS:  Get outside, walk, go shopping  OBJECTIVE:  Note: Objective measures were completed at Evaluation unless otherwise noted.  1/23: 30sec STS: 6 reps  TREATMENT:                                                                                                                              12/30/2023 Gait:  Pt ambulated well with and without cane.  She has good Wb'ing thru R LE and didn't favor R LE today.  She had no limp with gait. Stairs:  Pt ascended and descended stairs with a step to gait with the rail.   Hip AROM/PROM: Flexion:  85/100 Abd: 14/22 ER: 24/29 IR:  19/19  LE Strength (MMT): Hip abd:  4-/5 w/n available ROM Knee extension:  4/5  FOTO:  Prior/Current:  51/40.  Goal of 73  Upright bike lvl 1 x 7 mins Mini squats 2x10 Lateral band walks x 1 lap each at rail with YTB and RTB around thighs with UE support Step ups 2x10 on 6 inch step   12/17/23 Upright bike lvl 1 x 5 mins  Gait in hall without AD x 1 lap with 4 short standing rest breaks  Supine bridge x 20 reps and 10 reps S/L hip abduction with PT assist x 10 reps S/L clams 3x10 Sidestepping x 1 lap without UE support on rail, x 2 laps with YTB around thighs x 2 laps with UE support on rail Step ups on 6 inch step 2x10 Mini squats with hands on rail 2x10    12/04/2023 Supine bridge 3x10 S/L clams 3x10 S/L hip abd with PTA assist x10 Step ups 6 inch step 2x10 with UE support  Step downs 4 inch step with UE support x10 Sidestepping at rail with bilat UE support on rail x 2 laps Standing hip abd 2x 10 bilat with UE support Gait in hall without AD x1 lap Sit to stands 2x10 SLS trials   Pt received R hip PROM in flex, ER, and IR in supine per pt tolerance and tissue tolerance     PATIENT EDUCATION:  Education details: Anatomy of condition,  POC, HEP, objective findings, POC, exercise form/rationale, protocol and post op restrictions, and gait.   Person educated:  Patient Education method: Explanation, Demonstration, Tactile cues, Verbal cues, and Handouts Education comprehension: verbalized understanding, returned demonstration, verbal cues required, tactile cues required, and needs further education  HOME EXERCISE PROGRAM: [id number]   ASSESSMENT:  CLINICAL IMPRESSION: Pt is making slow but steady progress.  She is improving with tolerance for exercises and is slowly progressing with protocol.  Pt is improving with function as evidenced by subjective reports including ambulation, decreased usage of cane, stairs, household chores, pain, and transfers.  She continues to be limited with and have difficulty with prolonged standing, car transfers, and ambulation.  Though she is improving with R LE strength, she continues to have weakness t/o R LE.  She has limitations in hip ROM though is improving.  Pt demonstrates worse self perceived disability as evidenced by FOTO score decreasing from 51 to 40.  Pt is progressing toward goals and met STG #1 with PROM.  Pt typically feels better after Rx and reports improved pain from 4/10 to 2/10 after Rx.  Pt should benefit from continued skilled PT per protocol to improve strength, gait, ROM, mobility and to assist in restoring desired level of function.    REHAB POTENTIAL: Good  CLINICAL DECISION MAKING: Stable/uncomplicated  EVALUATION COMPLEXITY: Low   GOALS:   SHORT TERM GOALS: Target date: 4 weeks 12/7  Pain free flexion to 90 Baseline: Goal status: MET WITH PROM    2.  Demo controlled quad set with hold Baseline:  Goal status: GOAL MET  1/9  3.  30s sit to stand without pain or compensation Baseline:  Goal status: IN PROGRESS 1/23 (Some pain and compensation present)  4.  SLS 30s without compensation Baseline:  Goal status: INITIAL   5.  Pt will ambulate community distance with good stability without AD.   Goal status:  ONGOING  Target date:  12/04/2023   LONG TERM GOALS:  02/10/2024 Able to  demo step up on 6" step with level pelvis and proper form Baseline:  Goal status: PROGRESSING  1/9-- Week 8 1/4  2.  Will tolerate at least 3 min on elliptical, demonstrating good tolerance to repetitive weight bearing motion Baseline:  Goal status: INITIAL  Week 8 11/01/23  3.  Demonstrate proper form in at least 10 continuous lunges without increased pain Baseline:  Goal status: INITIAL  Week 12 11/29/23  4.  Pt will be able to perform stairs with a reciprocal gait with rail.   Goal status:  ongoing  5.  Pt will be able to perform her ADLs and functional mobility skills without significant pain and difficulty.   Goals status:  progressing  3/4     PLAN:  PT FREQUENCY: 2x/wk  PT DURATION: 5-6 weeks  PLANNED INTERVENTIONS: 97164- PT Re-evaluation, 97110-Therapeutic exercises, 97530- Therapeutic activity, 97112- Neuromuscular re-education, 97535- Self Care, 29562- Manual therapy, [phone]- Gait training, [phone]- Aquatic Therapy, [phone]- Electrical stimulation (unattended), Patient/Family education, Balance training, Stair training, Taping, Dry Needling, Joint mobilization, Spinal mobilization, Scar mobilization, Cryotherapy, and Moist heat.  PLAN FOR NEXT SESSION: Cont per Dr. Serena Croissant protocol and MD orders.    Audie Clear III PT, DPT 12/31/23 5:14 PM

## 2024-01-01 ENCOUNTER — Encounter (HOSPITAL_BASED_OUTPATIENT_CLINIC_OR_DEPARTMENT_OTHER): Payer: Self-pay | Admitting: Physical Therapy

## 2024-01-01 ENCOUNTER — Ambulatory Visit (HOSPITAL_BASED_OUTPATIENT_CLINIC_OR_DEPARTMENT_OTHER): Payer: Self-pay | Admitting: Orthopaedic Surgery

## 2024-01-01 ENCOUNTER — Ambulatory Visit (HOSPITAL_BASED_OUTPATIENT_CLINIC_OR_DEPARTMENT_OTHER): Payer: Medicare Other | Admitting: Physical Therapy

## 2024-01-01 ENCOUNTER — Ambulatory Visit (HOSPITAL_BASED_OUTPATIENT_CLINIC_OR_DEPARTMENT_OTHER): Payer: Medicare Other | Admitting: Orthopaedic Surgery

## 2024-01-01 DIAGNOSIS — R262 Difficulty in walking, not elsewhere classified: Secondary | ICD-10-CM

## 2024-01-01 DIAGNOSIS — M6281 Muscle weakness (generalized): Secondary | ICD-10-CM | POA: Diagnosis not present

## 2024-01-01 DIAGNOSIS — M25551 Pain in right hip: Secondary | ICD-10-CM

## 2024-01-01 DIAGNOSIS — M25651 Stiffness of right hip, not elsewhere classified: Secondary | ICD-10-CM

## 2024-01-01 NOTE — Progress Notes (Signed)
 Post Operative Evaluation    Procedure/Date of Surgery: Right hip arthroscopy and debridement 11/7  Interval History:   Presents today 12 weeks status post the above procedure.  Overall she is mildly improved on the unfortunately is continuing to have groin pain and soreness and requiring a cane in the right hand.  PMH/PSH/Family History/Social History/Meds/Allergies:    Past Medical History:  Diagnosis Date   Allergy    Anxiety    Back pain with radiation 07/13/2012   GERD (gastroesophageal reflux disease)    Glaucoma    Hyperlipidemia    Hypertension    Osteoarthritis    PONV (postoperative nausea and vomiting)    Past Surgical History:  Procedure Laterality Date   BREAST CYST EXCISION Right    COLONOSCOPY WITH PROPOFOL N/A 01/02/2022   Procedure: COLONOSCOPY WITH PROPOFOL;  Surgeon: Dolores Frame, MD;  Location: [facility];  Service: Gastroenterology;  Laterality: N/A;  1130   cyst removed from right breast     LAPAROSCOPIC SALPINGO OOPHERECTOMY Right 04/09/2017   Procedure: LAPAROSCOPIC RIGHT SALPINGO OOPHORECTOMY; RIGHT OVARIAN BENIGN CYSTIC TERATOMA;  Surgeon: Lazaro Arms, MD;  Location: [facility];  Service: Gynecology;  Laterality: Right;   PARTIAL HYSTERECTOMY     TUBAL LIGATION     Social History   Socioeconomic History   Marital status: Single    Spouse name: Not on file   Number of children: 3   Years of education: 12   Highest education level: 12th grade  Occupational History   Not on file  Tobacco Use   Smoking status: Never   Smokeless tobacco: Never  Vaping Use   Vaping status: Never Used  Substance and Sexual Activity   Alcohol use: Not Currently    Comment: occasionally wine   Drug use: No   Sexual activity: Not Currently    Birth control/protection: Surgical    Comment: hyst  Other Topics Concern   Not on file  Social History Narrative   Not on file   Social Drivers of Health    Financial Resource Strain: Low Risk  (01/17/2023)   Overall Financial Resource Strain (CARDIA)    Difficulty of Paying Living Expenses: Not hard at all  Food Insecurity: No Food Insecurity (01/17/2023)   Hunger Vital Sign    Worried About Running Out of Food in the Last Year: Never true    Ran Out of Food in the Last Year: Never true  Transportation Needs: No Transportation Needs (01/17/2023)   PRAPARE - Administrator, Civil Service (Medical): No    Lack of Transportation (Non-Medical): No  Physical Activity: Sufficiently Active (01/17/2023)   Exercise Vital Sign    Days of Exercise per Week: 7 days    Minutes of Exercise per Session: 60 min  Stress: Stress Concern Present (01/17/2023)   [employer] of Occupational Health - Occupational Stress Questionnaire    Feeling of Stress : To some extent  Social Connections: Unknown (01/17/2023)   Social Connection and Isolation Panel [NHANES]    Frequency of Communication with Friends and Family: More than three times a week    Frequency of Social Gatherings with Friends and Family: More than three times a week    Attends Religious Services: More than 4 times per year    Active Member of  Clubs or Organizations: Yes    Attends Engineer, structural: More than 4 times per year    Marital Status: Patient unable to answer   Family History  Problem Relation Age of Onset   Heart disease Mother    Heart disease Father    Diabetes Father    Hypertension Sister    Parkinson's disease Brother    Heart disease Maternal Grandmother    Cancer Paternal Grandmother        breast   Diabetes Paternal Grandfather    Hypertension Sister    Hypertension Sister    Down syndrome Son    Eczema Son    Post-traumatic stress disorder Daughter    Allergies  Allergen Reactions   Ibuprofen Palpitations and Other (See Comments)    Rapid heart rate.   Current Outpatient Medications  Medication Sig Dispense Refill   amLODipine  (NORVASC) 5 MG tablet TAKE ONE TABLET BY MOUTH EVERY DAY 30 tablet 0   Ascorbic Acid (VITAMIN C PO) Take 1 tablet by mouth daily. Power C     aspirin EC 325 MG tablet Take 1 tablet (325 mg total) by mouth daily. 14 tablet 0   citalopram (CELEXA) 40 MG tablet Take 1 tablet (40 mg total) by mouth daily. 30 tablet 0   clotrimazole-betamethasone (LOTRISONE) cream Apply twice daily to rash on back for 7 days, then as needed 45 g 1   ELDERBERRY PO Take 2 capsules by mouth daily.     fluticasone (FLONASE) 50 MCG/ACT nasal spray Place 2 sprays into both nostrils daily. Can do in the morning, or do one spray in more and one at night. 16 g 5   gabapentin (NEURONTIN) 300 MG capsule TAKE ONE CAPSULE BY MOUTH AT BEDTIME 90 capsule 0   HYDROcodone-acetaminophen (NORCO/VICODIN) 5-325 MG tablet Take 1 tablet by mouth every 6 (six) hours as needed for moderate pain (pain score 4-6). 20 tablet 0   hydrOXYzine (VISTARIL) 25 MG capsule Take one to two capsules at bedtime for itching and sleep 60 capsule 3   loratadine (ALLERGY RELIEF) 10 MG tablet TAKE (1) TABLET BY MOUTH ONCE DAILY AS NEEDED FOR ALLERGIES. 90 tablet 0   Omega-3 Fatty Acids (FISH OIL CONCENTRATE PO) Take 2 capsules by mouth daily. 788 mg each     oxybutynin (DITROPAN) 5 MG tablet TAKE ONE TABLET BY MOUTH 2 TIMES A DAY 60 tablet 2   oxyCODONE (ROXICODONE) 5 MG immediate release tablet Take 1 tablet (5 mg total) by mouth every 4 (four) hours as needed for severe pain (pain score 7-10) or breakthrough pain. 30 tablet 0   pantoprazole (PROTONIX) 20 MG tablet TAKE 1 TABLET BY MOUTH ONCE DAILY. 30 tablet 0   rosuvastatin (CRESTOR) 40 MG tablet TAKE ONE TABLET BY MOUTH EVERY DAY 90 tablet 0   vitamin B-12 (CYANOCOBALAMIN) 1000 MCG tablet Take 1,000 mcg by mouth daily.     Vitamin E 400 units TABS Take 400 Units by mouth daily.     No current facility-administered medications for this visit.   No results found.  Review of Systems:   A ROS was performed  including pertinent positives and negatives as documented in the HPI.   Musculoskeletal Exam:    There were no vitals taken for this visit.  Right hip incisions are well-appearing without erythema or drainage.  30 degrees internal/external rotation of the right hip.  She has active flexion of 30 degrees 90.  She walks with mildly antalgic gait  Imaging:      I personally reviewed and interpreted the radiographs.   Assessment:   6 weeks status post right hip arthroscopic labral debridement overall today with some residual pain.  I did discuss that her arthroscopic findings did show evidence of some arthritis and given this I did discuss that unfortunately I do believe that she would be a candidate for right total hip arthroplasty.  I did discuss that her arthroscopic findings showed more evidence of arthritis then we had initially anticipated based on the MRI findings.  Believe this is the cause of her residual soreness about the groin.  Overall she is very active and I do believe she would do extremely well with hip arthroplasty.  Given that I will plan to make a referral to Dr. Magnus Ivan who she has already seen before Plan :    -Plan for referral to Dr. Magnus Ivan for discussion of right hip arthroplasty      I personally saw and evaluated the patient, and participated in the management and treatment plan.  Huel Cote, MD Attending Physician, Orthopedic Surgery  This document was dictated using Dragon voice recognition software. A reasonable attempt at proof reading has been made to minimize errors.

## 2024-01-01 NOTE — Therapy (Signed)
 OUTPATIENT PHYSICAL THERAPY TREATMENT       Patient Name: [name] MRN: [id number] DOB:[birth year], 73 y.o., female Today's Date: 01/02/2024  END OF SESSION:  PT End of Session - 01/01/24 0810     Visit Number 19    Number of Visits 28    Date for PT Re-Evaluation 02/10/24    Authorization Type BCBS MCR    PT Start Time 0806    PT Stop Time 0845    PT Time Calculation (min) 39 min    Activity Tolerance Patient tolerated treatment well    Behavior During Therapy [facility] for tasks assessed/performed                    Past Medical History:  Diagnosis Date   Allergy    Anxiety    Back pain with radiation 07/13/2012   GERD (gastroesophageal reflux disease)    Glaucoma    Hyperlipidemia    Hypertension    Osteoarthritis    PONV (postoperative nausea and vomiting)    Past Surgical History:  Procedure Laterality Date   BREAST CYST EXCISION Right    COLONOSCOPY WITH PROPOFOL N/A 01/02/2022   Procedure: COLONOSCOPY WITH PROPOFOL;  Surgeon: Dolores Frame, MD;  Location: [facility];  Service: Gastroenterology;  Laterality: N/A;  1130   cyst removed from right breast     LAPAROSCOPIC SALPINGO OOPHERECTOMY Right 04/09/2017   Procedure: LAPAROSCOPIC RIGHT SALPINGO OOPHORECTOMY; RIGHT OVARIAN BENIGN CYSTIC TERATOMA;  Surgeon: Lazaro Arms, MD;  Location: [facility];  Service: Gynecology;  Laterality: Right;   PARTIAL HYSTERECTOMY     TUBAL LIGATION     Patient Active Problem List   Diagnosis Date Noted   Vitamin D deficiency 11/26/2023   Insomnia 11/26/2023   Dermatitis 11/26/2023   Dermatomycosis 11/26/2023   Tear of right acetabular labrum 09/04/2023   Family history of coronary artery disease 07/18/2023   Pre-op evaluation 07/06/2023   Nonspecific abnormal electrocardiogram (ECG) (EKG) 07/06/2023   Intermittent palpitations 07/06/2023   Syncope and collapse 07/06/2023   Sciatica of right side 02/27/2023   Osteoarthritis of hips, bilateral  09/24/2022   Cervicalgia 09/25/2020   Seasonal allergies 02/18/2020   Depression, major, single episode, in partial remission (HCC) 10/29/2019   Essential hypertension 01/01/2018   Prediabetes 03/30/2015   Preop cardiovascular exam 04/08/2014   Right hip pain 04/06/2014   Hyperlipemia 06/22/2008   GERD (gastroesophageal reflux disease) 05/24/2008   Allergic rhinitis 01/08/2008   OVERACTIVE BLADDER 04/10/2007   GAD (generalized anxiety disorder) 08/22/2006   OSTEOARTHRITIS 08/22/2006    REFERRING PROVIDER:  Huel Cote, MD     REFERRING DIAG:  [phone] (ICD-10-CM) - Tear of right acetabular labrum, initial encounter    s/p Rt hip labral repair  Rationale for Evaluation and Treatment: Rehabilitation  THERAPY DIAG:  Muscle weakness (generalized)  Pain in right hip  Difficulty in walking, not elsewhere classified  Stiffness of right hip, not elsewhere classified  ONSET DATE: DOS 11/7   SUBJECTIVE:  SUBJECTIVE STATEMENT: Pt is 17 weeks post op.  Pt states she sleeps better after having therapy.     RESPONSE TO PRIOR RX:  Pt states she felt good after prior Rx.  FUNCTIONAL IMPROVEMENTS:  walking better without cane, decreased usage of cane, stairs, household chores, pain, walking in her yard, transfers FUNCTIONAL LIMITATIONS:  prolonged standing, car transfers, ambulation distance.     PERTINENT HISTORY:  See PMH  PAIN:  Are you having pain? Yes: NPRS scale: 5/10 Pain location: R groin Pain description: stabbing Aggravating factors: constant Relieving factors: meds  PRECAUTIONS:  None  RED FLAGS: None   WEIGHT BEARING RESTRICTIONS:  Yes WBAT  FALLS:  Has patient fallen in last 6 months? No   PLOF:  Independent  PATIENT GOALS:  Get outside, walk, go  shopping  OBJECTIVE:  Note: Objective measures were completed at Evaluation unless otherwise noted.  1/23: 30sec STS: 6 reps  TREATMENT:                                                                                                                              01/01/24 Upright bike lvl 1 x 6 mins  Pt ambulated up/down the hallway for 1.5 reps without cane.  Squats with bilat UE support 2x10 Lateral band walks with RTB around thighs with UE support x 2 laps at rail Step ups on 6 inch step  2x10 Retro walks x 2 laps at rail Lateral step ups on 4 inch step 2x10  Supine bridge 3x10 S/L clams 2x10  12/30/2023 Gait:  Pt ambulated well with and without cane.  She has good Wb'ing thru R LE and didn't favor R LE today.  She had no limp with gait. Stairs:  Pt ascended and descended stairs with a step to gait with the rail.   Hip AROM/PROM: Flexion:  85/100 Abd: 14/22 ER: 24/29 IR:  19/19  LE Strength (MMT): Hip abd:  4-/5 w/n available ROM Knee extension:  4/5  FOTO:  Prior/Current:  51/40.  Goal of 73  Upright bike lvl 1 x 7 mins Mini squats 2x10 Lateral band walks x 1 lap each at rail with YTB and RTB around thighs with UE support Step ups 2x10 on 6 inch step   12/17/23 Upright bike lvl 1 x 5 mins  Gait in hall without AD x 1 lap with 4 short standing rest breaks  Supine bridge x 20 reps and 10 reps S/L hip abduction with PT assist x 10 reps S/L clams 3x10 Sidestepping x 1 lap without UE support on rail, x 2 laps with YTB around thighs x 2 laps with UE support on rail Step ups on 6 inch step 2x10 Mini squats with hands on rail 2x10    12/04/2023 Supine bridge 3x10 S/L clams 3x10 S/L hip abd with PTA assist x10 Step ups 6 inch step 2x10 with UE support  Step downs 4 inch step with UE support x10 Sidestepping at  rail with bilat UE support on rail x 2 laps Standing hip abd 2x 10 bilat with UE support Gait in hall without AD x1 lap Sit to stands 2x10 SLS trials    Pt received R hip PROM in flex, ER, and IR in supine per pt tolerance and tissue tolerance     PATIENT EDUCATION:  Education details: Anatomy of condition, POC, HEP, objective findings, POC, exercise form/rationale, protocol and post op restrictions, and gait.   Person educated: Patient Education method: Explanation, Demonstration, Tactile cues, Verbal cues, and Handouts Education comprehension: verbalized understanding, returned demonstration, verbal cues required, tactile cues required, and needs further education  HOME EXERCISE PROGRAM: [id number]   ASSESSMENT:  CLINICAL IMPRESSION: Pt is making slow but steady progress.  PT worked on gait without the cane.  Pt had pain ambulating down the hallway the 2nd rep and was unable to walk the full hallway.  She ambulated the entire hallway the first rep well without cane though turned around about halfway down the hall on the 2nd rep due to pain.  She is improving with tolerance for exercises and is slowly progressing with protocol.  PT added retro walks and lateral step ups today and she tolerated it well.  Pt performed exercises per protocol well.  She responded well to Rx reporting improved pain from 5/10 pain before Rx to 3/10 pain after Rx.  Pt should benefit from continued skilled PT per protocol to improve strength, gait, ROM, mobility and to assist in restoring desired level of function.     REHAB POTENTIAL: Good  CLINICAL DECISION MAKING: Stable/uncomplicated  EVALUATION COMPLEXITY: Low   GOALS:   SHORT TERM GOALS: Target date: 4 weeks 12/7  Pain free flexion to 90 Baseline: Goal status: MET WITH PROM    2.  Demo controlled quad set with hold Baseline:  Goal status: GOAL MET  1/9  3.  30s sit to stand without pain or compensation Baseline:  Goal status: IN PROGRESS 1/23 (Some pain and compensation present)  4.  SLS 30s without compensation Baseline:  Goal status: INITIAL   5.  Pt will ambulate community  distance with good stability without AD.   Goal status:  ONGOING  Target date:  12/04/2023   LONG TERM GOALS:  02/10/2024 Able to demo step up on 6" step with level pelvis and proper form Baseline:  Goal status: PROGRESSING  1/9-- Week 8 1/4  2.  Will tolerate at least 3 min on elliptical, demonstrating good tolerance to repetitive weight bearing motion Baseline:  Goal status: INITIAL  Week 8 11/01/23  3.  Demonstrate proper form in at least 10 continuous lunges without increased pain Baseline:  Goal status: INITIAL  Week 12 11/29/23  4.  Pt will be able to perform stairs with a reciprocal gait with rail.   Goal status:  ongoing  5.  Pt will be able to perform her ADLs and functional mobility skills without significant pain and difficulty.   Goals status:  progressing  3/4     PLAN:  PT FREQUENCY: 2x/wk  PT DURATION: 5-6 weeks  PLANNED INTERVENTIONS: 97164- PT Re-evaluation, 97110-Therapeutic exercises, 97530- Therapeutic activity, 97112- Neuromuscular re-education, 97535- Self Care, 16109- Manual therapy, [phone]- Gait training, [phone]- Aquatic Therapy, [phone]- Electrical stimulation (unattended), Patient/Family education, Balance training, Stair training, Taping, Dry Needling, Joint mobilization, Spinal mobilization, Scar mobilization, Cryotherapy, and Moist heat.  PLAN FOR NEXT SESSION: Cont per Dr. Serena Croissant protocol and MD orders.    Audie Clear III PT, DPT  01/02/24 7:06 AM

## 2024-01-05 DIAGNOSIS — K080 Exfoliation of teeth due to systemic causes: Secondary | ICD-10-CM | POA: Diagnosis not present

## 2024-01-05 NOTE — Therapy (Signed)
 OUTPATIENT PHYSICAL THERAPY TREATMENT       Patient Name: [name] MRN: [id number] DOB:Feb 03, 1951, 73 y.o., female Today's Date: 01/06/2024  END OF SESSION:  PT End of Session - 01/06/24 0926     Visit Number 19                     Past Medical History:  Diagnosis Date   Allergy    Anxiety    Back pain with radiation 07/13/2012   GERD (gastroesophageal reflux disease)    Glaucoma    Hyperlipidemia    Hypertension    Osteoarthritis    PONV (postoperative nausea and vomiting)    Past Surgical History:  Procedure Laterality Date   BREAST CYST EXCISION Right    COLONOSCOPY WITH PROPOFOL N/A 01/02/2022   Procedure: COLONOSCOPY WITH PROPOFOL;  Surgeon: Dolores Frame, MD;  [facility]: [facility];  Service: Gastroenterology;  Laterality: N/A;  1130   cyst removed from right breast     LAPAROSCOPIC SALPINGO OOPHERECTOMY Right 04/09/2017   Procedure: LAPAROSCOPIC RIGHT SALPINGO OOPHORECTOMY; RIGHT OVARIAN BENIGN CYSTIC TERATOMA;  Surgeon: Lazaro Arms, MD;  Location: [facility];  Service: Gynecology;  Laterality: Right;   PARTIAL HYSTERECTOMY     TUBAL LIGATION     Patient Active Problem List   Diagnosis Date Noted   Vitamin D deficiency 11/26/2023   Insomnia 11/26/2023   Dermatitis 11/26/2023   Dermatomycosis 11/26/2023   Tear of right acetabular labrum 09/04/2023   Family history of coronary artery disease 07/18/2023   Pre-op evaluation 07/06/2023   Nonspecific abnormal electrocardiogram (ECG) (EKG) 07/06/2023   Intermittent palpitations 07/06/2023   Syncope and collapse 07/06/2023   Sciatica of right side 02/27/2023   Osteoarthritis of hips, bilateral 09/24/2022   Cervicalgia 09/25/2020   Seasonal allergies 02/18/2020   Depression, major, single episode, in partial remission (HCC) 10/29/2019   Essential hypertension 01/01/2018   Prediabetes 03/30/2015   Preop cardiovascular exam 04/08/2014   Right hip pain 04/06/2014   Hyperlipemia  06/22/2008   GERD (gastroesophageal reflux disease) 05/24/2008   Allergic rhinitis 01/08/2008   OVERACTIVE BLADDER 04/10/2007   GAD (generalized anxiety disorder) 08/22/2006   OSTEOARTHRITIS 08/22/2006    REFERRING PROVIDER:  Huel Cote, MD     REFERRING DIAG:  [phone] (ICD-10-CM) - Tear of right acetabular labrum, initial encounter    s/p Rt hip labral repair  Rationale for Evaluation and Treatment: Rehabilitation  THERAPY DIAG:  Muscle weakness (generalized)  Pain in right hip  Difficulty in walking, not elsewhere classified  Stiffness of right hip, not elsewhere classified  ONSET DATE: DOS 11/7   SUBJECTIVE:  SUBJECTIVE STATEMENT: Pt saw MD and reports that surgeon thinks she needs a hip replacement.  Pt reports MD informed her to come to this week of therapy and then be finished with therapy.  Pt states she is a little weepy today and doesn't want to perform therapy.         PERTINENT HISTORY:  See PMH  PAIN:  Are you having pain? Yes: NPRS scale: 5/10 Pain location: R groin Pain description: stabbing Aggravating factors: constant Relieving factors: meds  PRECAUTIONS:  None  RED FLAGS: None   WEIGHT BEARING RESTRICTIONS:  Yes WBAT  FALLS:  Has patient fallen in last 6 months? No   PLOF:  Independent  PATIENT GOALS:  Get outside, walk, go shopping  OBJECTIVE:  Note: Objective measures were completed at Evaluation unless otherwise noted.  1/23: 30sec STS: 6 reps  TREATMENT:                                                                                                                               Pt declined therapy today.     PATIENT EDUCATION:  Education details: PT answered pt's questions.  POC   Person educated: Patient Education method:  Explanation Education comprehension: verbalized understanding  HOME EXERCISE PROGRAM: [id number]   ASSESSMENT:  CLINICAL IMPRESSION: Pt presents to Rx and states that MD is recommending a total hip replacement.  Pt declined therapy today stating she feels weepy and doesn't want to participate in therapy.     REHAB POTENTIAL: Good  CLINICAL DECISION MAKING: Stable/uncomplicated  EVALUATION COMPLEXITY: Low   GOALS:   SHORT TERM GOALS: Target date: 4 weeks 12/7  Pain free flexion to 90 Baseline: Goal status: MET WITH PROM    2.  Demo controlled quad set with hold Baseline:  Goal status: GOAL MET  1/9  3.  30s sit to stand without pain or compensation Baseline:  Goal status: IN PROGRESS 1/23 (Some pain and compensation present)  4.  SLS 30s without compensation Baseline:  Goal status: INITIAL   5.  Pt will ambulate community distance with good stability without AD.   Goal status:  ONGOING  Target date:  12/04/2023   LONG TERM GOALS:  02/10/2024 Able to demo step up on 6" step with level pelvis and proper form Baseline:  Goal status: PROGRESSING  1/9-- Week 8 1/4  2.  Will tolerate at least 3 min on elliptical, demonstrating good tolerance to repetitive weight bearing motion Baseline:  Goal status: INITIAL  Week 8 11/01/23  3.  Demonstrate proper form in at least 10 continuous lunges without increased pain Baseline:  Goal status: INITIAL  Week 12 11/29/23  4.  Pt will be able to perform stairs with a reciprocal gait with rail.   Goal status:  ongoing  5.  Pt will be able to perform her ADLs and functional mobility skills without significant pain and difficulty.   Goals status:  progressing  3/4  PLAN:  PT FREQUENCY: 2x/wk  PT DURATION: 5-6 weeks  PLANNED INTERVENTIONS: 97164- PT Re-evaluation, 97110-Therapeutic exercises, 97530- Therapeutic activity, 97112- Neuromuscular re-education, 97535- Self Care, 81191- Manual therapy, [phone]- Gait training, [phone]-  Aquatic Therapy, [phone]- Electrical stimulation (unattended), Patient/Family education, Balance training, Stair training, Taping, Dry Needling, Joint mobilization, Spinal mobilization, Scar mobilization, Cryotherapy, and Moist heat.  PLAN FOR NEXT SESSION:  Pt will be discharged next visit per pt per MD orders.  Pt is being referred to surgeon for possible hip replacement.    Audie Clear III PT, DPT 01/06/24 9:33 AM

## 2024-01-06 ENCOUNTER — Other Ambulatory Visit: Payer: Self-pay | Admitting: Family Medicine

## 2024-01-06 ENCOUNTER — Ambulatory Visit (HOSPITAL_BASED_OUTPATIENT_CLINIC_OR_DEPARTMENT_OTHER): Payer: Medicare Other | Admitting: Physical Therapy

## 2024-01-06 DIAGNOSIS — F4323 Adjustment disorder with mixed anxiety and depressed mood: Secondary | ICD-10-CM

## 2024-01-06 DIAGNOSIS — M25551 Pain in right hip: Secondary | ICD-10-CM

## 2024-01-06 DIAGNOSIS — M6281 Muscle weakness (generalized): Secondary | ICD-10-CM

## 2024-01-06 DIAGNOSIS — R262 Difficulty in walking, not elsewhere classified: Secondary | ICD-10-CM

## 2024-01-06 DIAGNOSIS — M25651 Stiffness of right hip, not elsewhere classified: Secondary | ICD-10-CM

## 2024-01-08 ENCOUNTER — Ambulatory Visit (HOSPITAL_BASED_OUTPATIENT_CLINIC_OR_DEPARTMENT_OTHER): Payer: Medicare Other | Admitting: Physical Therapy

## 2024-01-08 ENCOUNTER — Encounter (HOSPITAL_BASED_OUTPATIENT_CLINIC_OR_DEPARTMENT_OTHER): Payer: Self-pay | Admitting: Physical Therapy

## 2024-01-08 DIAGNOSIS — M25551 Pain in right hip: Secondary | ICD-10-CM

## 2024-01-08 DIAGNOSIS — R262 Difficulty in walking, not elsewhere classified: Secondary | ICD-10-CM

## 2024-01-08 DIAGNOSIS — M6281 Muscle weakness (generalized): Secondary | ICD-10-CM | POA: Diagnosis not present

## 2024-01-08 DIAGNOSIS — M25651 Stiffness of right hip, not elsewhere classified: Secondary | ICD-10-CM

## 2024-01-08 NOTE — Therapy (Signed)
 OUTPATIENT PHYSICAL THERAPY TREATMENT       Patient Name: [name] MRN: [id number] DOB:1951/10/12, 73 y.o., female Today's Date: 01/08/2024  END OF SESSION:  PT End of Session - 01/08/24 0854     Visit Number 20    Number of Visits 20    Date for PT Re-Evaluation 02/10/24    Authorization Type BCBS MCR    PT Start Time 1478    PT Stop Time 0937    PT Time Calculation (min) 45 min    Activity Tolerance Patient tolerated treatment well    Behavior During Therapy [facility] for tasks assessed/performed                    Past Medical History:  Diagnosis Date   Allergy    Anxiety    Back pain with radiation 07/13/2012   GERD (gastroesophageal reflux disease)    Glaucoma    Hyperlipidemia    Hypertension    Osteoarthritis    PONV (postoperative nausea and vomiting)    Past Surgical History:  Procedure Laterality Date   BREAST CYST EXCISION Right    COLONOSCOPY WITH PROPOFOL N/A 01/02/2022   Procedure: COLONOSCOPY WITH PROPOFOL;  Surgeon: Dolores Frame, MD;  [facility]: [facility];  Service: Gastroenterology;  Laterality: N/A;  1130   cyst removed from right breast     LAPAROSCOPIC SALPINGO OOPHERECTOMY Right 04/09/2017   Procedure: LAPAROSCOPIC RIGHT SALPINGO OOPHORECTOMY; RIGHT OVARIAN BENIGN CYSTIC TERATOMA;  Surgeon: Lazaro Arms, MD;  Location: [facility];  Service: Gynecology;  Laterality: Right;   PARTIAL HYSTERECTOMY     TUBAL LIGATION     Patient Active Problem List   Diagnosis Date Noted   Vitamin D deficiency 11/26/2023   Insomnia 11/26/2023   Dermatitis 11/26/2023   Dermatomycosis 11/26/2023   Tear of right acetabular labrum 09/04/2023   Family history of coronary artery disease 07/18/2023   Pre-op evaluation 07/06/2023   Nonspecific abnormal electrocardiogram (ECG) (EKG) 07/06/2023   Intermittent palpitations 07/06/2023   Syncope and collapse 07/06/2023   Sciatica of right side 02/27/2023   Osteoarthritis of hips, bilateral  09/24/2022   Cervicalgia 09/25/2020   Seasonal allergies 02/18/2020   Depression, major, single episode, in partial remission (HCC) 10/29/2019   Essential hypertension 01/01/2018   Prediabetes 03/30/2015   Preop cardiovascular exam 04/08/2014   Right hip pain 04/06/2014   Hyperlipemia 06/22/2008   GERD (gastroesophageal reflux disease) 05/24/2008   Allergic rhinitis 01/08/2008   OVERACTIVE BLADDER 04/10/2007   GAD (generalized anxiety disorder) 08/22/2006   OSTEOARTHRITIS 08/22/2006    REFERRING PROVIDER:  Huel Cote, MD     REFERRING DIAG:  [phone] (ICD-10-CM) - Tear of right acetabular labrum, initial encounter    s/p Rt hip labral repair  Rationale for Evaluation and Treatment: Rehabilitation  THERAPY DIAG:  Muscle weakness (generalized)  Pain in right hip  Difficulty in walking, not elsewhere classified  Stiffness of right hip, not elsewhere classified  ONSET DATE: DOS 11/7   SUBJECTIVE:  SUBJECTIVE STATEMENT: Pt is 18 weeks post op.  Pt reports she did a lot of walking without her cane yesterday.  She had some pain when lying down at night.  Pt states she was able to get out of her car well today.      PERTINENT HISTORY:  See PMH  PAIN:  Are you having pain? Yes: NPRS scale: 5/10 Pain location: R groin Pain description: stabbing Aggravating factors: constant Relieving factors: meds  PRECAUTIONS:  None  RED FLAGS: None   WEIGHT BEARING RESTRICTIONS:  Yes WBAT  FALLS:  Has patient fallen in last 6 months? No   PLOF:  Independent  PATIENT GOALS:  Get outside, walk, go shopping  OBJECTIVE:  Note: Objective measures were completed at Evaluation unless otherwise noted.  1/23: 30sec STS: 6 reps  TREATMENT:                                                                                                                               01/08/24 Upright bike lvl 1 x 5 mins Supine bridge 3x10 S/L clams 2x10 Step ups on 6 inch step x10, 8 inch x 10 reps Retro walks x 2 laps at rail Lateral band walks with RTB around thighs with UE support x 2 laps at rail  PT reviewed and updated HEP.  Pt received a HEP handout and was educated in correct form and appropriate frequency.  PT instructed pt she should not have pain with HEP and to stop exercises if she does have pain.    PATIENT EDUCATION:  Education details: Teacher, music of condition, POC/discharge planning, HEP, exercise form/rationale.  PT answered pt's questions. Person educated: Patient Education method: Explanation, Demonstration, Tactile cues, Verbal cues, and Handouts Education comprehension: verbalized understanding, returned demonstration, verbal cues required, tactile cues required, and needs further education  HOME EXERCISE PROGRAM: [id number]   ASSESSMENT:  CLINICAL IMPRESSION: Pt saw MD last week who recommended pt needing a hip replacement and to finish up therapy per pt.  Pt continues to have hip pain.  She has made slow but steady progress in PT.  She continues to use the cane though is walking some without the cane.  PT spent time today instructing pt in HEP and answering pt's questions.  Pt received a HEP handout and was instructed in correct form and appropriate frequency.  Pt demonstrates good understanding of HEP.  Pt's goals were assessed her last treatment on 12/30/23.  She responded well to Rx and will be discharged.     REHAB POTENTIAL: Good  CLINICAL DECISION MAKING: Stable/uncomplicated  EVALUATION COMPLEXITY: Low   GOALS:   SHORT TERM GOALS: Target date: 4 weeks 12/7  Pain free flexion to 90 Baseline: Goal status: MET WITH PROM    2.  Demo controlled quad set with hold Baseline:  Goal status: GOAL MET  1/9  3.  30s sit to stand without pain or  compensation Baseline:  Goal status: IN PROGRESS  1/23 (Some pain and compensation present)  4.  SLS 30s without compensation Baseline:  Goal status: INITIAL   5.  Pt will ambulate community distance with good stability without AD.   Goal status:  ONGOING  Target date:  12/04/2023   LONG TERM GOALS:  02/10/2024 Able to demo step up on 6" step with level pelvis and proper form Baseline:  Goal status: PROGRESSING  1/9-- Week 8 1/4  2.  Will tolerate at least 3 min on elliptical, demonstrating good tolerance to repetitive weight bearing motion Baseline:  Goal status: INITIAL  Week 8 11/01/23  3.  Demonstrate proper form in at least 10 continuous lunges without increased pain Baseline:  Goal status: INITIAL  Week 12 11/29/23  4.  Pt will be able to perform stairs with a reciprocal gait with rail.   Goal status:  ongoing  5.  Pt will be able to perform her ADLs and functional mobility skills without significant pain and difficulty.   Goals status:  progressing  3/4     PLAN:   PLANNED INTERVENTIONS: 97164- PT Re-evaluation, 97110-Therapeutic exercises, 97530- Therapeutic activity, 97112- Neuromuscular re-education, 97535- Self Care, 16109- Manual therapy, [phone]- Gait training, [phone]- Aquatic Therapy, [phone]- Electrical stimulation (unattended), Patient/Family education, Balance training, Stair training, Taping, Dry Needling, Joint mobilization, Spinal mobilization, Scar mobilization, Cryotherapy, and Moist heat.  PLAN FOR NEXT SESSION:  Pt will be discharged from skilled PT.  MD recommended further surgery.  Pt is planning on meeting with ortho surgeon and having surgery.  Pt will cont with HEP.  Pt is agreeable with discharge.     PHYSICAL THERAPY DISCHARGE SUMMARY  Visits from Start of Care: 20  Current functional level related to goals / functional outcomes: See above   Remaining deficits: See above   Education / Equipment: See above    Audie Clear III PT, DPT 01/08/24  10:45 PM

## 2024-01-13 ENCOUNTER — Encounter (HOSPITAL_BASED_OUTPATIENT_CLINIC_OR_DEPARTMENT_OTHER): Payer: Medicare Other

## 2024-01-15 ENCOUNTER — Encounter (HOSPITAL_BASED_OUTPATIENT_CLINIC_OR_DEPARTMENT_OTHER): Payer: Medicare Other | Admitting: Physical Therapy

## 2024-01-20 ENCOUNTER — Encounter (HOSPITAL_BASED_OUTPATIENT_CLINIC_OR_DEPARTMENT_OTHER): Payer: Medicare Other

## 2024-01-22 ENCOUNTER — Encounter (HOSPITAL_BASED_OUTPATIENT_CLINIC_OR_DEPARTMENT_OTHER): Payer: Medicare Other | Admitting: Physical Therapy

## 2024-01-27 ENCOUNTER — Ambulatory Visit (INDEPENDENT_AMBULATORY_CARE_PROVIDER_SITE_OTHER): Payer: Medicare Other

## 2024-01-27 VITALS — BP 120/72 | Ht 64.0 in | Wt 145.0 lb

## 2024-01-27 DIAGNOSIS — Z2821 Immunization not carried out because of patient refusal: Secondary | ICD-10-CM

## 2024-01-27 DIAGNOSIS — Z5320 Procedure and treatment not carried out because of patient's decision for unspecified reasons: Secondary | ICD-10-CM

## 2024-01-27 DIAGNOSIS — Z0000 Encounter for general adult medical examination without abnormal findings: Secondary | ICD-10-CM

## 2024-01-27 NOTE — Progress Notes (Signed)
 Because this visit was a virtual/telehealth visit,  certain criteria was not obtained, such a blood pressure, CBG if applicable, and timed get up and go. Any medications not marked as "taking" were not mentioned during the medication reconciliation part of the visit. Any vitals not documented were not able to be obtained due to this being a telehealth visit or patient was unable to self-report a recent blood pressure reading due to a lack of equipment at home via telehealth. Vitals that have been documented are verbally provided by the patient.   Subjective:   [name] is a 73 y.o. who presents for a Medicare Wellness preventive visit.  Visit Complete: Virtual I connected with  [name] on 01/27/24 by a audio enabled telemedicine application and verified that I am speaking with the correct person using two identifiers.  Patient Location: Home  Provider Location: Home Office  I discussed the limitations of evaluation and management by telemedicine. The patient expressed understanding and agreed to proceed.  Vital Signs: Because this visit was a virtual/telehealth visit, some criteria may be missing or patient reported. Any vitals not documented were not able to be obtained and vitals that have been documented are patient reported.  VideoDeclined- This patient declined Librarian, academic. Therefore the visit was completed with audio only.  Persons Participating in Visit: Patient.  AWV Questionnaire: No: Patient Medicare AWV questionnaire was not completed prior to this visit.  Cardiac Risk Factors include: advanced age (>29men, >70 women);hypertension     Objective:    Today's Vitals   01/27/24 1006  BP: 120/72  Weight: 145 lb (65.8 kg)  Height: 5\' 4"  (1.626 m)   Body mass index is 24.89 kg/m.     01/27/2024   10:05 AM 09/06/2023   12:08 PM 08/28/2023   10:05 AM 06/26/2023    1:10 PM 04/29/2023   10:04 AM 04/15/2023    8:46 PM 01/17/2023    3:07 PM   Advanced Directives  Does Patient Have a Medical Advance Directive? No No No No No No No  Does patient want to make changes to medical advance directive?       No - Patient declined  Would patient like information on creating a medical advance directive? No - Patient declined No - Patient declined No - Patient declined  No - Patient declined No - Patient declined No - Patient declined    Current Medications (verified) Outpatient Encounter Medications as of 01/27/2024  Medication Sig   amLODipine (NORVASC) 5 MG tablet TAKE ONE TABLET BY MOUTH EVERY DAY   Ascorbic Acid (VITAMIN C PO) Take 1 tablet by mouth daily. Power C   aspirin EC 325 MG tablet Take 1 tablet (325 mg total) by mouth daily.   citalopram (CELEXA) 40 MG tablet Take 1 tablet (40 mg total) by mouth daily.   clotrimazole-betamethasone (LOTRISONE) cream Apply twice daily to rash on back for 7 days, then as needed   ELDERBERRY PO Take 2 capsules by mouth daily.   fluticasone (FLONASE) 50 MCG/ACT nasal spray Place 2 sprays into both nostrils daily. Can do in the morning, or do one spray in more and one at night.   gabapentin (NEURONTIN) 300 MG capsule TAKE ONE CAPSULE BY MOUTH AT BEDTIME   hydrOXYzine (VISTARIL) 25 MG capsule Take one to two capsules at bedtime for itching and sleep   loratadine (ALLERGY RELIEF) 10 MG tablet TAKE (1) TABLET BY MOUTH ONCE DAILY AS NEEDED FOR ALLERGIES.  Omega-3 Fatty Acids (FISH OIL CONCENTRATE PO) Take 2 capsules by mouth daily. 788 mg each   pantoprazole (PROTONIX) 20 MG tablet TAKE 1 TABLET BY MOUTH ONCE DAILY.   rosuvastatin (CRESTOR) 40 MG tablet TAKE ONE TABLET BY MOUTH EVERY DAY   vitamin B-12 (CYANOCOBALAMIN) 1000 MCG tablet Take 1,000 mcg by mouth daily.   Vitamin E 400 units TABS Take 400 Units by mouth daily.   HYDROcodone-acetaminophen (NORCO/VICODIN) 5-325 MG tablet Take 1 tablet by mouth every 6 (six) hours as needed for moderate pain (pain score 4-6). (Patient not taking: Reported on  01/27/2024)   oxybutynin (DITROPAN) 5 MG tablet TAKE ONE TABLET BY MOUTH 2 TIMES A DAY (Patient not taking: Reported on 01/27/2024)   oxyCODONE (ROXICODONE) 5 MG immediate release tablet Take 1 tablet (5 mg total) by mouth every 4 (four) hours as needed for severe pain (pain score 7-10) or breakthrough pain. (Patient not taking: Reported on 01/27/2024)   No facility-administered encounter medications on file as of 01/27/2024.    Allergies (verified) Ibuprofen   History: Past Medical History:  Diagnosis Date   Allergy    Anxiety    Back pain with radiation 07/13/2012   GERD (gastroesophageal reflux disease)    Glaucoma    Hyperlipidemia    Hypertension    Osteoarthritis    PONV (postoperative nausea and vomiting)    Past Surgical History:  Procedure Laterality Date   BREAST CYST EXCISION Right    COLONOSCOPY WITH PROPOFOL N/A 01/02/2022   Procedure: COLONOSCOPY WITH PROPOFOL;  Surgeon: Dolores Frame, MD;  Location: [facility];  Service: Gastroenterology;  Laterality: N/A;  1130   cyst removed from right breast     LAPAROSCOPIC SALPINGO OOPHERECTOMY Right 04/09/2017   Procedure: LAPAROSCOPIC RIGHT SALPINGO OOPHORECTOMY; RIGHT OVARIAN BENIGN CYSTIC TERATOMA;  Surgeon: Lazaro Arms, MD;  Location: [facility];  Service: Gynecology;  Laterality: Right;   PARTIAL HYSTERECTOMY     TUBAL LIGATION     Family History  Problem Relation Age of Onset   Heart disease Mother    Heart disease Father    Diabetes Father    Hypertension Sister    Parkinson's disease Brother    Heart disease Maternal Grandmother    Cancer Paternal Grandmother        breast   Diabetes Paternal Grandfather    Hypertension Sister    Hypertension Sister    Down syndrome Son    Eczema Son    Post-traumatic stress disorder Daughter    Social History   Socioeconomic History   Marital status: Single    Spouse name: Not on file   Number of children: 3   Years of education: 12   Highest education  level: 12th grade  Occupational History   Not on file  Tobacco Use   Smoking status: Never   Smokeless tobacco: Never  Vaping Use   Vaping status: Never Used  Substance and Sexual Activity   Alcohol use: Not Currently    Comment: occasionally wine   Drug use: No   Sexual activity: Not Currently    Birth control/protection: Surgical    Comment: hyst  Other Topics Concern   Not on file  Social History Narrative   Not on file   Social Drivers of Health   Financial Resource Strain: Low Risk  (01/27/2024)   Overall Financial Resource Strain (CARDIA)    Difficulty of Paying Living Expenses: Not hard at all  Food Insecurity: No Food Insecurity (01/27/2024)  Hunger Vital Sign    Worried About Running Out of Food in the Last Year: Never true    Ran Out of Food in the Last Year: Never true  Transportation Needs: No Transportation Needs (01/27/2024)   PRAPARE - Administrator, Civil Service (Medical): No    Lack of Transportation (Non-Medical): No  Physical Activity: Sufficiently Active (01/27/2024)   Exercise Vital Sign    Days of Exercise per Week: 7 days    Minutes of Exercise per Session: 60 min  Stress: Stress Concern Present (01/27/2024)   [employer] of Occupational Health - Occupational Stress Questionnaire    Feeling of Stress : To some extent  Social Connections: Unknown (01/27/2024)   Social Connection and Isolation Panel [NHANES]    Frequency of Communication with Friends and Family: More than three times a week    Frequency of Social Gatherings with Friends and Family: More than three times a week    Attends Religious Services: More than 4 times per year    Active Member of [employer] or Organizations: Yes    Attends Engineer, structural: More than 4 times per year    Marital Status: Patient unable to answer    Tobacco Counseling Counseling given: Not Answered    Clinical Intake:  Pre-visit preparation completed: Yes  Pain : No/denies pain      BMI - recorded: 24.89 Nutritional Status: BMI of 19-24  Normal Nutritional Risks: None Diabetes: No  Lab Results  Component Value Date   HGBA1C 5.9 (H) 02/27/2023   HGBA1C 5.7 (H) 07/26/2022   HGBA1C 6.1 (H) 11/20/2021     How often do you need to have someone help you when you read instructions, pamphlets, or other written materials from your doctor or pharmacy?: 1 - Never  Interpreter Needed?: No  Information entered by :: Phylis Bougie   Activities of Daily Living     01/27/2024   10:12 AM 08/28/2023   10:07 AM  In your present state of health, do you have any difficulty performing the following activities:  Hearing? 0   Vision? 0   Difficulty concentrating or making decisions? 0   Walking or climbing stairs? 0   Dressing or bathing? 0   Doing errands, shopping? 0 0  Preparing Food and eating ? N   Using the Toilet? N   In the past six months, have you accidently leaked urine? N   Do you have problems with loss of bowel control? N   Managing your Medications? N   Managing your Finances? N   Housekeeping or managing your Housekeeping? N     Patient Care Team: Kerri Perches, MD as PCP - General Mallipeddi, Orion Modest, MD as PCP - Cardiology (Cardiology)  Indicate any recent Medical Services you may have received from other than [facility] providers in the past year (date may be approximate).     Assessment:   This is a routine wellness examination for Yicel.  Hearing/Vision screen Hearing Screening - Comments:: No problems hearing Vision Screening - Comments:: Wears glasses.   Goals Addressed             This Visit's Progress    Patient Stated   On track    Patient states that her goal is to stay healthy        Depression Screen     01/27/2024   10:13 AM 11/25/2023    8:06 AM 06/25/2023    1:27 PM 02/27/2023  11:07 AM 01/17/2023    3:05 PM 12/25/2022    1:55 PM 09/24/2022    9:31 AM  PHQ 2/9 Scores  PHQ - 2 Score 0 0 0 0 0 0 0  PHQ- 9  Score 0   4  0 0    Fall Risk     01/27/2024   10:10 AM 11/25/2023    8:06 AM 06/25/2023    1:26 PM 02/27/2023   11:07 AM 01/17/2023    3:07 PM  Fall Risk   Falls in the past year? 0 0 0 0 0  Number falls in past yr: 0 0 0 0 0  Injury with Fall? 0 0 0 0 0  Risk for fall due to : No Fall Risks No Fall Risks No Fall Risks No Fall Risks No Fall Risks  Follow up Falls prevention discussed;Falls evaluation completed Falls evaluation completed Falls evaluation completed Falls evaluation completed     MEDICARE RISK AT HOME:  Medicare Risk at Home Any stairs in or around the home?: Yes If so, are there any without handrails?: No Home free of loose throw rugs in walkways, pet beds, electrical cords, etc?: Yes Adequate lighting in your home to reduce risk of falls?: Yes Life alert?: No Use of a cane, walker or w/c?: No Grab bars in the bathroom?: No Shower chair or bench in shower?: No Elevated toilet seat or a handicapped toilet?: No  TIMED UP AND GO:  Was the test performed?  No  Cognitive Function: 6CIT completed        01/27/2024   10:08 AM 01/17/2023    3:07 PM 01/14/2022    4:15 PM 09/28/2020    9:51 AM 09/28/2019    9:48 AM  6CIT Screen  What Year? 0 points 0 points 0 points 0 points 0 points  What month? 0 points 0 points 0 points 0 points 0 points  What time? 0 points 0 points 0 points 0 points 0 points  Count back from 20 0 points 0 points 0 points 0 points 0 points  Months in reverse 0 points 0 points 0 points 0 points 0 points  Repeat phrase 2 points 0 points 0 points 0 points 0 points  Total Score 2 points 0 points 0 points 0 points 0 points    Immunizations Immunization History  Administered Date(s) Administered   Fluad Quad(high Dose 65+) 06/28/2019, 08/16/2020, 07/26/2022   Fluad Trivalent(High Dose 65+) 06/25/2023   Influenza Split 07/13/2012, 08/13/2016   Influenza, High Dose Seasonal PF 09/07/2018   Influenza-Unspecified 08/21/2021   Moderna SARS-COV2 Booster  Vaccination 10/05/2020   Moderna Sars-Covid-2 Vaccination 02/05/2020, 03/07/2020, 10/05/2020, 01/25/2022   Pfizer(Comirnaty)Fall Seasonal Vaccine 12 years and older 07/08/2023   Pneumococcal Conjugate-13 03/06/2017   Pneumococcal Polysaccharide-23 09/08/2018   Td 05/17/2008   Tdap 02/27/2023   Zoster Recombinant(Shingrix) 11/22/2021, 01/25/2022   Zoster, Live 07/13/2012    Screening Tests Health Maintenance  Topic Date Due   COVID-19 Vaccine (7 - 2024-25 season) 01/05/2024   INFLUENZA VACCINE  05/28/2024   Medicare Annual Wellness (AWV)  01/26/2025   MAMMOGRAM  03/19/2025   Colonoscopy  01/03/2032   DTaP/Tdap/Td (3 - Td or Tdap) 02/26/2033   Pneumonia Vaccine 72+ Years old  Completed   DEXA SCAN  Completed   Hepatitis C Screening  Completed   Zoster Vaccines- Shingrix  Completed   HPV VACCINES  Aged Out    Health Maintenance  Health Maintenance Due  Topic Date Due   COVID-19  Vaccine (7 - 2024-25 season) 01/05/2024   Health Maintenance Items Addressed: patient is scheduled for mammogram   Additional Screening:  Vision Screening: Recommended annual ophthalmology exams for early detection of glaucoma and other disorders of the eye.  Dental Screening: Recommended annual dental exams for proper oral hygiene  Community Resource Referral / Chronic Care Management: CRR required this visit?  No   CCM required this visit?  No     Plan:     I have personally reviewed and noted the following in the patient's chart:   Medical and social history Use of alcohol, tobacco or illicit drugs  Current medications and supplements including opioid prescriptions. Patient is not currently taking opioid prescriptions. Functional ability and status Nutritional status Physical activity Advanced directives List of other physicians Hospitalizations, surgeries, and ER visits in previous 12 months Vitals Screenings to include cognitive, depression, and falls Referrals and  appointments  In addition, I have reviewed and discussed with patient certain preventive protocols, quality metrics, and best practice recommendations. A written personalized care plan for preventive services as well as general preventive health recommendations were provided to patient.     Rudi Heap, [place]   01/27/2024   After Visit Summary: (MyChart) Due to this being a telephonic visit, the after visit summary with patients personalized plan was offered to patient via MyChart   Notes: Nothing significant to report at this time.

## 2024-01-27 NOTE — Patient Instructions (Signed)
 [name] , Thank you for taking time to come for your Medicare Wellness Visit. I appreciate your ongoing commitment to your health goals. Please review the following plan we discussed and let me know if I can assist you in the future.   Referrals/Orders/Follow-Ups/Clinician Recommendations: Follow up in one year For annual wellness   This is a list of the screening recommended for you and due dates:  Health Maintenance  Topic Date Due   COVID-19 Vaccine (7 - 2024-25 season) 01/05/2024   Flu Shot  05/28/2024   Medicare Annual Wellness Visit  01/26/2025   Mammogram  03/19/2025   Colon Cancer Screening  01/03/2032   DTaP/Tdap/Td vaccine (3 - Td or Tdap) 02/26/2033   Pneumonia Vaccine  Completed   DEXA scan (bone density measurement)  Completed   Hepatitis C Screening  Completed   Zoster (Shingles) Vaccine  Completed   HPV Vaccine  Aged Out    Advanced directives: (Declined) Advance directive discussed with you today. Even though you declined this today, please call our office should you change your mind, and we can give you the proper paperwork for you to fill out.  Next Medicare Annual Wellness Visit scheduled for next year: Yes

## 2024-01-30 ENCOUNTER — Ambulatory Visit (INDEPENDENT_AMBULATORY_CARE_PROVIDER_SITE_OTHER)

## 2024-01-30 VITALS — BP 117/61 | HR 93 | Ht 63.0 in | Wt 163.0 lb

## 2024-01-30 DIAGNOSIS — J0140 Acute pansinusitis, unspecified: Secondary | ICD-10-CM

## 2024-01-30 MED ORDER — AZITHROMYCIN 250 MG PO TABS: ORAL_TABLET | ORAL | 0 refills | Status: AC

## 2024-01-30 NOTE — Progress Notes (Signed)
 Acute Office Visit  Subjective:     Patient ID: [name], female    DOB: May 03, 1951, 73 y.o.   MRN: [id number]  Chief Complaint  Patient presents with   Care Management    Pt. States she has "bad sinus issues", Pt also states it in the "back of her head on the right side there is a lot of pressure"    HPI Patient is in today for Sinusitis: Patient presents with chronic sinusitis. The patient reports chronic sinus infections for 2 weeks.  Her symptoms include nasal congestion, headaches, periorbital venous congestion.  There has not been a history of none of significance. There has not been a history of chronic otitis media or pharyngotonsillitis.  Prior antibiotic therapy has included Azithromycin. Other medications have included Flonase, antihistamines.  She has not had allergy testing which was not done.     ROS      Objective:    BP 117/61 (BP Location: Right Arm, Patient Position: Sitting, Cuff Size: Large)   Pulse 93   Ht 5\' 3"  (1.6 m)   Wt 163 lb (73.9 kg)   SpO2 95%   BMI 28.87 kg/m    Physical Exam Vitals and nursing note reviewed.  Constitutional:      Appearance: Normal appearance.  HENT:     Head: Normocephalic.     Right Ear: Tympanic membrane, ear canal and external ear normal.     Left Ear: Tympanic membrane, ear canal and external ear normal.     Nose: Congestion present.     Right Turbinates: Swollen.     Left Turbinates: Swollen.     Left Sinus: Maxillary sinus tenderness present.     Mouth/Throat:     Mouth: Mucous membranes are moist.     Pharynx: Oropharynx is clear.  Eyes:     Extraocular Movements: Extraocular movements intact.     Pupils: Pupils are equal, round, and reactive to light.  Cardiovascular:     Rate and Rhythm: Normal rate and regular rhythm.  Pulmonary:     Effort: Pulmonary effort is normal.     Breath sounds: Normal breath sounds.  Musculoskeletal:     Cervical back: Normal range of motion and neck supple. No edema  or rigidity. Muscular tenderness (left occipital) present. Normal range of motion.  Lymphadenopathy:     Cervical: No cervical adenopathy.  Skin:    General: Skin is warm and dry.  Neurological:     Mental Status: She is alert and oriented to person, place, and time.  Psychiatric:        Mood and Affect: Mood normal.        Thought Content: Thought content normal.     No results found for any visits on 01/30/24.      Assessment & Plan:   Problem List Items Addressed This Visit   None Visit Diagnoses       Acute non-recurrent pansinusitis    -  Primary   treat with oral atbx, Azithromycin.  continue with Claritin and Flonase.  Recommend applying warm compress to affected area.  F/U if no improvement in 5-7 days.   Relevant Medications   azithromycin (ZITHROMAX) 250 MG tablet       Meds ordered this encounter  Medications   azithromycin (ZITHROMAX) 250 MG tablet    Sig: Take 2 tablets on day 1, then 1 tablet daily on days 2 through 5    Dispense:  6 tablet    Refill:  0    No follow-ups on file.  Darral Dash, FNP

## 2024-02-06 ENCOUNTER — Other Ambulatory Visit: Payer: Self-pay | Admitting: Family Medicine

## 2024-02-10 ENCOUNTER — Other Ambulatory Visit: Payer: Self-pay | Admitting: Family Medicine

## 2024-03-05 ENCOUNTER — Other Ambulatory Visit: Payer: Self-pay | Admitting: Family Medicine

## 2024-03-05 DIAGNOSIS — F4323 Adjustment disorder with mixed anxiety and depressed mood: Secondary | ICD-10-CM

## 2024-03-11 ENCOUNTER — Other Ambulatory Visit: Payer: Self-pay | Admitting: Internal Medicine

## 2024-03-24 ENCOUNTER — Encounter: Payer: Self-pay | Admitting: Family Medicine

## 2024-03-24 ENCOUNTER — Encounter (HOSPITAL_COMMUNITY): Payer: Self-pay

## 2024-03-24 ENCOUNTER — Ambulatory Visit (INDEPENDENT_AMBULATORY_CARE_PROVIDER_SITE_OTHER): Payer: Medicare Other | Admitting: Family Medicine

## 2024-03-24 ENCOUNTER — Ambulatory Visit (HOSPITAL_COMMUNITY)
Admission: RE | Admit: 2024-03-24 | Discharge: 2024-03-24 | Disposition: A | Discharge status: Home / Self Care | Payer: Medicare Other | Source: Ambulatory Visit | Attending: Family Medicine | Admitting: Family Medicine

## 2024-03-24 VITALS — BP 121/72 | HR 81 | Resp 18 | Ht 63.0 in | Wt 158.1 lb

## 2024-03-24 DIAGNOSIS — I10 Essential (primary) hypertension: Secondary | ICD-10-CM | POA: Diagnosis not present

## 2024-03-24 DIAGNOSIS — Z1231 Encounter for screening mammogram for malignant neoplasm of breast: Secondary | ICD-10-CM | POA: Diagnosis not present

## 2024-03-24 DIAGNOSIS — R7303 Prediabetes: Secondary | ICD-10-CM | POA: Diagnosis not present

## 2024-03-24 DIAGNOSIS — Z0001 Encounter for general adult medical examination with abnormal findings: Secondary | ICD-10-CM | POA: Insufficient documentation

## 2024-03-24 DIAGNOSIS — E785 Hyperlipidemia, unspecified: Secondary | ICD-10-CM | POA: Diagnosis not present

## 2024-03-24 DIAGNOSIS — E559 Vitamin D deficiency, unspecified: Secondary | ICD-10-CM | POA: Diagnosis not present

## 2024-03-24 DIAGNOSIS — M542 Cervicalgia: Secondary | ICD-10-CM | POA: Diagnosis not present

## 2024-03-24 MED ORDER — PREDNISONE 10 MG PO TABS: 10.0000 mg | ORAL_TABLET | Freq: Two times a day (BID) | ORAL | 0 refills | Status: AC

## 2024-03-24 MED ORDER — METHYLPREDNISOLONE ACETATE 80 MG/ML IJ SUSP
80.0000 mg | Freq: Once | INTRAMUSCULAR | Status: AC
  Administered 2024-03-24: 80 mg via INTRAMUSCULAR

## 2024-03-24 NOTE — Patient Instructions (Signed)
 F/U in 4  months, call if you need me sooner  Depo medrol  80 mg IM  in office for neck pain, and 5 days prednisone  are prescribed     Labs in office today already ordered  X ray of neck this week   Thanks for choosing [facility], we consider it a privelige to serve you.

## 2024-03-24 NOTE — Assessment & Plan Note (Signed)
 Uncontrolled, x ray , depo medrol  80 mg iM and 5 day course of prednisone 

## 2024-03-24 NOTE — Assessment & Plan Note (Signed)

## 2024-03-24 NOTE — Progress Notes (Signed)
    [name]     MRN: [id number]      DOB: [birth year]  Chief Complaint  Patient presents with   Annual Exam    cpe    HPI: Patient is in for annual physical exam. C/O LEFT NECK PAIN AND SPASM. Immunization is reviewed , and  updated if needed.   PE: BP 121/72   Pulse 81   Resp 18   Ht 5\' 3"  (1.6 m)   Wt 158 lb 1.9 oz (71.7 kg)   SpO2 96%   BMI 28.01 kg/m   Pleasant  female, alert and oriented x 3, in no cardio-pulmonary distress. Afebrile. HEENT No facial trauma or asymetry. Sinuses non tender.  Extra occullar muscles intact.. External ears normal, . Neck: decreased ROM with left trapezius spasm, , no adenopathy,JVD or thyromegaly.No bruits.  Chest: Clear to ascultation bilaterally.No crackles or wheezes. Non tender to palpation  Cardiovascular system; Heart sounds normal,  S1 and  S2 ,no S3.  No murmur, or thrill. Apical beat not displaced Peripheral pulses normal.  Abdomen: Soft, non tender, no organomegaly or masses. No bruits. Bowel sounds normal. No guarding, tenderness or rebound.      Musculoskeletal exam: Full ROM of spine,  decreased in hips , shoulders and knees. No deformity ,swelling or crepitus noted. No muscle wasting or atrophy.   Neurologic: Cranial nerves 2 to 12 intact. Power, tone ,sensation as normal throughout. No disturbance in gait. No tremor.  Skin: Intact, no ulceration, erythema , scaling or rash noted. Pigmentation normal throughout  Psych; Normal mood and affect. Judgement and concentration normal   Assessment & Plan:  Encounter for Medicare annual examination with abnormal findings Annual exam as documented. Counseling done  re healthy lifestyle involving commitment to 150 minutes exercise per week, heart healthy diet, and attaining healthy weight.The importance of adequate sleep also discussed. Regular seat belt use and home safety, is also discussed. Changes in health habits are decided on by the patient with  goals and time frames  set for achieving them. Immunization and cancer screening needs are specifically addressed at this visit.   Neck pain on left side Uncontrolled, x ray , depo medrol  80 mg iM and 5 day course of prednisone

## 2024-03-25 ENCOUNTER — Ambulatory Visit: Payer: Self-pay | Admitting: Family Medicine

## 2024-03-25 ENCOUNTER — Other Ambulatory Visit: Payer: Self-pay | Admitting: Family Medicine

## 2024-03-25 LAB — CMP14+EGFR
ALT: 24 IU/L (ref 0–32)
AST: 31 IU/L (ref 0–40)
Albumin: 4.5 g/dL (ref 3.8–4.8)
Alkaline Phosphatase: 63 IU/L (ref 44–121)
BUN/Creatinine Ratio: 9 — ABNORMAL LOW (ref 12–28)
BUN: 8 mg/dL (ref 8–27)
Bilirubin Total: 0.4 mg/dL (ref 0.0–1.2)
CO2: 23 mmol/L (ref 20–29)
Calcium: 10.2 mg/dL (ref 8.7–10.3)
Chloride: 101 mmol/L (ref 96–106)
Creatinine, Ser: 0.86 mg/dL (ref 0.57–1.00)
Globulin, Total: 2.7 g/dL (ref 1.5–4.5)
Glucose: 98 mg/dL (ref 70–99)
Potassium: 4.2 mmol/L (ref 3.5–5.2)
Sodium: 139 mmol/L (ref 134–144)
Total Protein: 7.2 g/dL (ref 6.0–8.5)
eGFR: 72 mL/min/{1.73_m2} (ref >59)

## 2024-03-25 LAB — CBC
Hematocrit: 38.1 % (ref 34.0–46.6)
Hemoglobin: 12.3 g/dL (ref 11.1–15.9)
MCH: 31.1 pg (ref 26.6–33.0)
MCHC: 32.3 g/dL (ref 31.5–35.7)
MCV: 96 fL (ref 79–97)
Platelets: 300 10*3/uL (ref 150–450)
RBC: 3.96 x10E6/uL (ref 3.77–5.28)
RDW: 13.3 % (ref 11.7–15.4)
WBC: 7.9 10*3/uL (ref 3.4–10.8)

## 2024-03-25 LAB — LIPID PANEL
Chol/HDL Ratio: 2.6 ratio (ref 0.0–4.4)
Cholesterol, Total: 169 mg/dL (ref 100–199)
HDL: 65 mg/dL (ref >39)
LDL Chol Calc (NIH): 83 mg/dL (ref 0–99)
Triglycerides: 122 mg/dL (ref 0–149)
VLDL Cholesterol Cal: 21 mg/dL (ref 5–40)

## 2024-03-25 LAB — VITAMIN D 25 HYDROXY (VIT D DEFICIENCY, FRACTURES): Vit D, 25-Hydroxy: 66.1 ng/mL (ref 30.0–100.0)

## 2024-03-25 LAB — HEMOGLOBIN A1C
Est. average glucose Bld gHb Est-mCnc: 123 mg/dL
Hgb A1c MFr Bld: 5.9 % — ABNORMAL HIGH (ref 4.8–5.6)

## 2024-03-25 LAB — TSH: TSH: 2.76 u[IU]/mL (ref 0.450–4.500)

## 2024-04-05 ENCOUNTER — Other Ambulatory Visit: Payer: Self-pay | Admitting: Family Medicine

## 2024-04-05 DIAGNOSIS — F4323 Adjustment disorder with mixed anxiety and depressed mood: Secondary | ICD-10-CM

## 2024-04-06 ENCOUNTER — Other Ambulatory Visit: Payer: Self-pay | Admitting: Family Medicine

## 2024-05-04 ENCOUNTER — Other Ambulatory Visit: Payer: Self-pay | Admitting: Family Medicine

## 2024-05-04 DIAGNOSIS — F4323 Adjustment disorder with mixed anxiety and depressed mood: Secondary | ICD-10-CM

## 2024-05-05 ENCOUNTER — Other Ambulatory Visit: Payer: Self-pay | Admitting: Family Medicine

## 2024-06-01 ENCOUNTER — Other Ambulatory Visit: Payer: Self-pay | Admitting: Family Medicine

## 2024-06-01 DIAGNOSIS — F4323 Adjustment disorder with mixed anxiety and depressed mood: Secondary | ICD-10-CM

## 2024-06-03 ENCOUNTER — Telehealth: Payer: Self-pay | Admitting: Family Medicine

## 2024-06-03 NOTE — Telephone Encounter (Signed)
 Disability Placard Noted Scanned Original in provider box Copy at front desk

## 2024-06-11 ENCOUNTER — Other Ambulatory Visit: Payer: Self-pay | Admitting: Family Medicine

## 2024-06-11 ENCOUNTER — Other Ambulatory Visit: Payer: Self-pay | Admitting: Internal Medicine

## 2024-06-11 DIAGNOSIS — F4323 Adjustment disorder with mixed anxiety and depressed mood: Secondary | ICD-10-CM

## 2024-07-01 ENCOUNTER — Other Ambulatory Visit: Payer: Self-pay | Admitting: Family Medicine

## 2024-07-04 ENCOUNTER — Other Ambulatory Visit: Payer: Self-pay | Admitting: Family Medicine

## 2024-07-04 DIAGNOSIS — F4323 Adjustment disorder with mixed anxiety and depressed mood: Secondary | ICD-10-CM

## 2024-07-28 ENCOUNTER — Ambulatory Visit: Admitting: Family Medicine

## 2024-08-05 ENCOUNTER — Other Ambulatory Visit: Payer: Self-pay | Admitting: Family Medicine

## 2024-08-05 DIAGNOSIS — F4323 Adjustment disorder with mixed anxiety and depressed mood: Secondary | ICD-10-CM

## 2024-08-11 ENCOUNTER — Encounter (INDEPENDENT_AMBULATORY_CARE_PROVIDER_SITE_OTHER): Payer: Self-pay | Admitting: Gastroenterology

## 2024-08-25 ENCOUNTER — Ambulatory Visit
Admission: EM | Admit: 2024-08-25 | Discharge: 2024-08-25 | Disposition: A | Discharge status: Home / Self Care | Attending: Nurse Practitioner | Admitting: Nurse Practitioner

## 2024-08-25 DIAGNOSIS — H66002 Acute suppurative otitis media without spontaneous rupture of ear drum, left ear: Secondary | ICD-10-CM | POA: Diagnosis not present

## 2024-08-25 MED ORDER — AMOXICILLIN-POT CLAVULANATE 875-125 MG PO TABS: 1.0000 | ORAL_TABLET | Freq: Two times a day (BID) | ORAL | 0 refills | Status: AC

## 2024-08-25 NOTE — Discharge Instructions (Signed)
 Take the Augmentin as prescribed to treat the ear infection in your left ear.  Continue the allergy pill and start the Flonase  nasal spray to help with the fluid behind your ear.  Seek care if symptoms do not improve with treatment.

## 2024-08-25 NOTE — ED Provider Notes (Signed)
 [facility]    CSN: [id number] Arrival date & time: 08/25/24  9078      History   Chief Complaint No chief complaint on file.   HPI [name] is a 73 y.o. female.   Patient presents today with 1 week history of left-sided ear pain, sinus pain in her cheeks and above her left eye, and pain in her nose.  Reports she feels very congested and lots of pressure on the left side of her face.  She is also having pain behind the left ear, congested cough, runny and stuffy nose, postnasal drainage, and decreased appetite.  Also endorses muffled hearing but denies any ear drainage.  No fever, body aches or chills, shortness of breath or chest pain, abdominal pain, nausea/vomiting, or diarrhea.  Has used over-the-counter earache drops, over-the-counter pain relievers which help her sleep but do not help with the pain.  She is wearing a cottonball in her left ear currently because the cold air makes the pain worse.  Reports the pain is worse at nighttime as well.  She takes loratadine  daily for allergies.  Not currently using Flonase .    Past Medical History:  Diagnosis Date   Allergy    Anxiety    Back pain with radiation 07/13/2012   GERD (gastroesophageal reflux disease)    Glaucoma    Hyperlipidemia    Hypertension    Osteoarthritis    PONV (postoperative nausea and vomiting)     Patient Active Problem List   Diagnosis Date Noted   Encounter for Medicare annual examination with abnormal findings 03/24/2024   Neck pain on left side 03/24/2024   Vitamin D  deficiency 11/26/2023   Insomnia 11/26/2023   Dermatitis 11/26/2023   Dermatomycosis 11/26/2023   Tear of right acetabular labrum 09/04/2023   Family history of coronary artery disease 07/18/2023   Pre-op evaluation 07/06/2023   Nonspecific abnormal electrocardiogram (ECG) (EKG) 07/06/2023   Intermittent palpitations 07/06/2023   Syncope and collapse 07/06/2023   Sciatica of right side 02/27/2023   Osteoarthritis  of hips, bilateral 09/24/2022   Cervicalgia 09/25/2020   Seasonal allergies 02/18/2020   Depression, major, single episode, in partial remission 10/29/2019   Essential hypertension 01/01/2018   Prediabetes 03/30/2015   Preop cardiovascular exam 04/08/2014   Right hip pain 04/06/2014   Hyperlipemia 06/22/2008   GERD (gastroesophageal reflux disease) 05/24/2008   Allergic rhinitis 01/08/2008   OVERACTIVE BLADDER 04/10/2007   GAD (generalized anxiety disorder) 08/22/2006   OSTEOARTHRITIS 08/22/2006    Past Surgical History:  Procedure Laterality Date   BREAST CYST EXCISION Right    COLONOSCOPY WITH PROPOFOL  N/A 01/02/2022   Procedure: COLONOSCOPY WITH PROPOFOL ;  Surgeon: Eartha Angelia Sieving, MD;  Location: [facility];  Service: Gastroenterology;  Laterality: N/A;  1130   cyst removed from right breast     LAPAROSCOPIC SALPINGO OOPHERECTOMY Right 04/09/2017   Procedure: LAPAROSCOPIC RIGHT SALPINGO OOPHORECTOMY; RIGHT OVARIAN BENIGN CYSTIC TERATOMA;  Surgeon: Jayne Vonn DEL, MD;  Location: [facility];  Service: Gynecology;  Laterality: Right;   PARTIAL HYSTERECTOMY     TUBAL LIGATION      OB History     Gravida  3   Para  3   Term  3   Preterm      AB      Living  3      SAB      IAB      Ectopic      Multiple  Live Births  3            Home Medications    Prior to Admission medications   Medication Sig Start Date End Date Taking? Authorizing Provider  amoxicillin-clavulanate (AUGMENTIN) 875-125 MG tablet Take 1 tablet by mouth 2 (two) times daily for 7 days. 08/25/24 09/01/24 Yes Chandra Harlene LABOR, NP  amLODipine  (NORVASC ) 5 MG tablet TAKE ONE TABLET BY MOUTH EVERY DAY 05/05/24   Antonetta Rollene BRAVO, MD  Ascorbic Acid (VITAMIN C PO) Take 1 tablet by mouth daily. Power C    [provider]  citalopram  (CELEXA ) 40 MG tablet Take 1 tablet (40 mg total) by mouth daily. 08/05/24   Antonetta Rollene BRAVO, MD  ELDERBERRY PO Take 2 capsules by  mouth daily.    [provider]  fluticasone  (FLONASE ) 50 MCG/ACT nasal spray Place 2 sprays into both nostrils daily. Can do in the morning, or do one spray in more and one at night. 06/25/23   Antonetta Rollene BRAVO, MD  gabapentin  (NEURONTIN ) 300 MG capsule TAKE ONE CAPSULE BY MOUTH AT BEDTIME 07/05/24   Antonetta Rollene BRAVO, MD  loratadine  (ALLERGY RELIEF) 10 MG tablet TAKE (1) TABLET BY MOUTH ONCE DAILY AS NEEDED FOR ALLERGIES. 06/11/24   Antonetta Rollene BRAVO, MD  Omega-3 Fatty Acids  (FISH OIL  CONCENTRATE PO) Take 2 capsules by mouth daily. 788 mg each    [provider]  oxybutynin  (DITROPAN ) 5 MG tablet TAKE ONE TABLET BY MOUTH TWICE DAILY 07/01/24   Antonetta Rollene BRAVO, MD  pantoprazole  (PROTONIX ) 20 MG tablet TAKE 1 TABLET BY MOUTH ONCE DAILY. 08/05/24   Antonetta Rollene BRAVO, MD  predniSONE  (DELTASONE ) 10 MG tablet Take 1 tablet (10 mg total) by mouth 2 (two) times daily with a meal. 03/24/24   Antonetta Rollene BRAVO, MD  rosuvastatin  (CRESTOR ) 40 MG tablet Take 1 tablet (40 mg total) by mouth daily. 04/06/24   Antonetta Rollene BRAVO, MD  vitamin B-12 (CYANOCOBALAMIN) 1000 MCG tablet Take 1,000 mcg by mouth daily.    [provider]  Vitamin E 400 units TABS Take 400 Units by mouth daily.    [provider]    Family History Family History  Problem Relation Age of Onset   Heart disease Mother    Heart disease Father    Diabetes Father    Hypertension Sister    Hypertension Sister    Hypertension Sister    Post-traumatic stress disorder Daughter    Heart disease Maternal Grandmother    Breast cancer Paternal Grandmother    Cancer Paternal Grandmother        breast   Diabetes Paternal Grandfather    Parkinson's disease Brother    Down syndrome Son    Eczema Son     Social History Social History   Tobacco Use   Smoking status: Never   Smokeless tobacco: Never  Vaping Use   Vaping status: Never Used  Substance Use Topics   Alcohol use: Not Currently     Comment: occasionally wine   Drug use: No     Allergies   Ibuprofen    Review of Systems Review of Systems Per HPI  Physical Exam Triage Vital Signs ED Triage Vitals  Encounter Vitals Group     BP 08/25/24 0956 129/84     Girls Systolic BP Percentile --      Girls Diastolic BP Percentile --      Boys Systolic BP Percentile --      Boys Diastolic BP Percentile --  Pulse Rate 08/25/24 0956 82     Resp 08/25/24 0956 16     Temp 08/25/24 0956 98.4 F (36.9 C)     Temp Source 08/25/24 0956 Oral     SpO2 08/25/24 0956 96 %     Weight --      Height --      Head Circumference --      Peak Flow --      Pain Score 08/25/24 0955 10     Pain Loc --      Pain Education --      Exclude from Growth Chart --    No data found.  Updated Vital Signs BP 129/84 (BP Location: Right Arm)   Pulse 82   Temp 98.4 F (36.9 C) (Oral)   Resp 16   SpO2 96%   Visual Acuity Right Eye Distance:   Left Eye Distance:   Bilateral Distance:    Right Eye Near:   Left Eye Near:    Bilateral Near:     Physical Exam Vitals and nursing note reviewed.  Constitutional:      General: She is not in acute distress.    Appearance: Normal appearance. She is not ill-appearing or toxic-appearing.  HENT:     Head: Normocephalic and atraumatic.     Right Ear: Ear canal and external ear normal. A middle ear effusion is present.     Left Ear: Ear canal and external ear normal. A middle ear effusion is present. Tympanic membrane is erythematous and bulging.     Nose: No congestion or rhinorrhea.     Mouth/Throat:     Mouth: Mucous membranes are moist.     Pharynx: Oropharynx is clear. Postnasal drip present. No oropharyngeal exudate or posterior oropharyngeal erythema.  Eyes:     General: No scleral icterus.    Extraocular Movements: Extraocular movements intact.  Cardiovascular:     Rate and Rhythm: Normal rate and regular rhythm.  Pulmonary:     Effort: Pulmonary effort is normal. No  respiratory distress.     Breath sounds: Normal breath sounds. No wheezing, rhonchi or rales.  Musculoskeletal:     Cervical back: Normal range of motion and neck supple.  Lymphadenopathy:     Cervical: No cervical adenopathy.  Skin:    General: Skin is warm and dry.     Coloration: Skin is not jaundiced or pale.     Findings: No erythema or rash.  Neurological:     Mental Status: She is alert and oriented to person, place, and time.  Psychiatric:        Behavior: Behavior is cooperative.      UC Treatments / Results  Labs (all labs ordered are listed, but only abnormal results are displayed) Labs Reviewed - No data to display  EKG   Radiology No results found.  Procedures Procedures (including critical care time)  Medications Ordered in UC Medications - No data to display  Initial Impression / Assessment and Plan / UC Course  I have reviewed the triage vital signs and the nursing notes.  Pertinent labs & imaging results that were available during my care of the patient were reviewed by me and considered in my medical decision making (see chart for details).   Patient is well-appearing, normotensive, afebrile, not tachycardic, not tachypneic, oxygenating well on room air.   1. Non-recurrent acute suppurative otitis media of left ear without spontaneous rupture of tympanic membrane Treat with Augmentin twice daily for 7 days  Other supportive care discussed with patient, recommended starting Flonase  nasal spray Return and ER precautions discussed  The patient was given the opportunity to ask questions.  All questions answered to their satisfaction.  The patient is in agreement to this plan.   Final Clinical Impressions(s) / UC Diagnoses   Final diagnoses:  Non-recurrent acute suppurative otitis media of left ear without spontaneous rupture of tympanic membrane     Discharge Instructions      Take the Augmentin as prescribed to treat the ear infection in your  left ear.  Continue the allergy pill and start the Flonase  nasal spray to help with the fluid behind your ear.  Seek care if symptoms do not improve with treatment.     ED Prescriptions     Medication Sig Dispense Auth. Provider   amoxicillin-clavulanate (AUGMENTIN) 875-125 MG tablet Take 1 tablet by mouth 2 (two) times daily for 7 days. 14 tablet Chandra Harlene LABOR, NP      PDMP not reviewed this encounter.   Chandra Harlene LABOR, NP 08/25/24 1044

## 2024-08-25 NOTE — ED Triage Notes (Signed)
 Pt reports left ear pain, nose and left eye.  x 1 week. Pt has tried OTC pain relievers, ear drops. Has found no relief

## 2024-08-30 ENCOUNTER — Encounter: Payer: Self-pay | Admitting: Radiology

## 2024-08-30 ENCOUNTER — Other Ambulatory Visit: Payer: Self-pay | Admitting: Family Medicine

## 2024-08-30 DIAGNOSIS — F4323 Adjustment disorder with mixed anxiety and depressed mood: Secondary | ICD-10-CM

## 2024-09-09 DIAGNOSIS — K080 Exfoliation of teeth due to systemic causes: Secondary | ICD-10-CM | POA: Diagnosis not present

## 2024-09-17 ENCOUNTER — Telehealth: Payer: Self-pay | Admitting: Family Medicine

## 2024-09-17 NOTE — Telephone Encounter (Signed)
 Placard form Copied Noted Sleeved Original provider box Annella) Copy front desk folder Call patient when ready for pick up

## 2024-09-17 NOTE — Telephone Encounter (Signed)
 Given to provider

## 2024-09-19 ENCOUNTER — Other Ambulatory Visit: Payer: Self-pay | Admitting: Family Medicine

## 2024-09-20 NOTE — Telephone Encounter (Signed)
 Completed, placed up front. N/a vm full

## 2024-09-28 ENCOUNTER — Other Ambulatory Visit: Payer: Self-pay | Admitting: Family Medicine

## 2024-09-28 DIAGNOSIS — F4323 Adjustment disorder with mixed anxiety and depressed mood: Secondary | ICD-10-CM

## 2024-10-24 ENCOUNTER — Other Ambulatory Visit: Payer: Self-pay | Admitting: Family Medicine

## 2024-10-25 ENCOUNTER — Other Ambulatory Visit: Payer: Self-pay | Admitting: Family Medicine

## 2024-10-25 DIAGNOSIS — F4323 Adjustment disorder with mixed anxiety and depressed mood: Secondary | ICD-10-CM

## 2024-10-27 ENCOUNTER — Ambulatory Visit

## 2024-11-04 ENCOUNTER — Other Ambulatory Visit: Payer: Self-pay

## 2024-11-04 DIAGNOSIS — J302 Other seasonal allergic rhinitis: Secondary | ICD-10-CM

## 2024-11-04 MED ORDER — FLUTICASONE PROPIONATE 50 MCG/ACT NA SUSP: 2.0000 | Freq: Every day | NASAL | 5 refills | Status: AC

## 2024-11-05 ENCOUNTER — Ambulatory Visit: Admitting: Family Medicine

## 2024-11-29 ENCOUNTER — Other Ambulatory Visit: Payer: Self-pay | Admitting: Family Medicine

## 2024-11-29 DIAGNOSIS — F4323 Adjustment disorder with mixed anxiety and depressed mood: Secondary | ICD-10-CM

## 2025-01-31 ENCOUNTER — Ambulatory Visit
